# Patient Record
Sex: Female | Born: 1940
Health system: Southern US, Community
[De-identification: ages and names within clinical notes are randomized; demographics above are authoritative.]

## PROBLEM LIST (undated history)

## (undated) DIAGNOSIS — Z8719 Personal history of other diseases of the digestive system: Secondary | ICD-10-CM

## (undated) DIAGNOSIS — R011 Cardiac murmur, unspecified: Secondary | ICD-10-CM

## (undated) DIAGNOSIS — M4802 Spinal stenosis, cervical region: Secondary | ICD-10-CM

## (undated) DIAGNOSIS — B029 Zoster without complications: Secondary | ICD-10-CM

## (undated) DIAGNOSIS — S065X9A Traumatic subdural hemorrhage with loss of consciousness of unspecified duration, initial encounter: Secondary | ICD-10-CM

## (undated) DIAGNOSIS — Z872 Personal history of diseases of the skin and subcutaneous tissue: Secondary | ICD-10-CM

## (undated) DIAGNOSIS — N189 Chronic kidney disease, unspecified: Secondary | ICD-10-CM

## (undated) DIAGNOSIS — K219 Gastro-esophageal reflux disease without esophagitis: Secondary | ICD-10-CM

## (undated) DIAGNOSIS — S065XAA Traumatic subdural hemorrhage with loss of consciousness status unknown, initial encounter: Secondary | ICD-10-CM

## (undated) DIAGNOSIS — E079 Disorder of thyroid, unspecified: Secondary | ICD-10-CM

## (undated) DIAGNOSIS — R197 Diarrhea, unspecified: Secondary | ICD-10-CM

## (undated) DIAGNOSIS — I10 Essential (primary) hypertension: Secondary | ICD-10-CM

## (undated) DIAGNOSIS — K5901 Slow transit constipation: Secondary | ICD-10-CM

## (undated) DIAGNOSIS — H269 Unspecified cataract: Secondary | ICD-10-CM

## (undated) DIAGNOSIS — E039 Hypothyroidism, unspecified: Secondary | ICD-10-CM

## (undated) DIAGNOSIS — C4370 Malignant melanoma of unspecified lower limb, including hip: Secondary | ICD-10-CM

## (undated) DIAGNOSIS — K635 Polyp of colon: Secondary | ICD-10-CM

## (undated) DIAGNOSIS — E785 Hyperlipidemia, unspecified: Secondary | ICD-10-CM

## (undated) HISTORY — DX: Chronic kidney disease, unspecified: N18.9

## (undated) HISTORY — DX: Spinal stenosis, cervical region: M48.02

## (undated) HISTORY — PX: ANTERIOR AND POSTERIOR VAGINAL REPAIR: SUR5

## (undated) HISTORY — DX: Zoster without complications: B02.9

## (undated) HISTORY — PX: LUNG BIOPSY: SHX232

## (undated) HISTORY — PX: ANTERIOR AND POSTERIOR VAGINAL REPAIR W/ SACROSPINOUS LIGAMENT SUSPENSION: SUR6

## (undated) HISTORY — PX: COLONOSCOPY: SHX174

## (undated) HISTORY — DX: Malignant melanoma of unspecified lower limb, including hip: C43.70

## (undated) HISTORY — PX: ESOPHAGOGASTRODUODENOSCOPY: SHX1529

---

## 1961-03-16 HISTORY — PX: BREAST EXCISIONAL BIOPSY: SUR124

## 1961-03-16 HISTORY — PX: BREAST BIOPSY: SHX20

## 1968-03-16 HISTORY — PX: OOPHORECTOMY: SHX86

## 1972-03-16 HISTORY — PX: ABDOMINAL HYSTERECTOMY: SHX81

## 2004-05-29 ENCOUNTER — Ambulatory Visit: Payer: Self-pay | Admitting: Unknown Physician Specialty

## 2005-03-16 HISTORY — PX: LUNG BIOPSY: SHX232

## 2005-07-03 ENCOUNTER — Ambulatory Visit: Payer: Self-pay | Admitting: Unknown Physician Specialty

## 2005-09-24 ENCOUNTER — Ambulatory Visit: Payer: Self-pay | Admitting: Unknown Physician Specialty

## 2005-10-02 ENCOUNTER — Ambulatory Visit: Payer: Self-pay | Admitting: Unknown Physician Specialty

## 2005-11-18 ENCOUNTER — Ambulatory Visit: Payer: Self-pay | Admitting: Cardiothoracic Surgery

## 2005-11-19 ENCOUNTER — Ambulatory Visit: Payer: Self-pay | Admitting: Gastroenterology

## 2005-11-23 ENCOUNTER — Ambulatory Visit: Payer: Self-pay | Admitting: General Surgery

## 2005-11-25 ENCOUNTER — Other Ambulatory Visit: Payer: Self-pay

## 2005-12-02 ENCOUNTER — Inpatient Hospital Stay: Payer: Self-pay | Admitting: General Surgery

## 2006-03-10 ENCOUNTER — Ambulatory Visit: Payer: Self-pay | Admitting: Cardiothoracic Surgery

## 2006-07-29 ENCOUNTER — Ambulatory Visit: Payer: Self-pay | Admitting: Unknown Physician Specialty

## 2006-12-14 ENCOUNTER — Ambulatory Visit: Payer: Self-pay | Admitting: Unknown Physician Specialty

## 2007-08-09 ENCOUNTER — Ambulatory Visit: Payer: Self-pay | Admitting: Unknown Physician Specialty

## 2007-10-10 ENCOUNTER — Ambulatory Visit: Payer: Self-pay | Admitting: Unknown Physician Specialty

## 2008-07-12 ENCOUNTER — Ambulatory Visit: Payer: Self-pay

## 2008-08-15 ENCOUNTER — Ambulatory Visit: Payer: Self-pay | Admitting: Unknown Physician Specialty

## 2009-02-15 ENCOUNTER — Emergency Department: Payer: Self-pay | Admitting: Emergency Medicine

## 2009-08-20 ENCOUNTER — Ambulatory Visit: Payer: Self-pay | Admitting: Unknown Physician Specialty

## 2010-08-27 ENCOUNTER — Ambulatory Visit: Payer: Self-pay | Admitting: Unknown Physician Specialty

## 2010-08-29 ENCOUNTER — Ambulatory Visit: Payer: Self-pay | Admitting: Unknown Physician Specialty

## 2010-11-14 ENCOUNTER — Ambulatory Visit: Payer: Self-pay | Admitting: Unknown Physician Specialty

## 2010-11-25 ENCOUNTER — Ambulatory Visit: Payer: Self-pay

## 2011-02-09 ENCOUNTER — Ambulatory Visit (INDEPENDENT_AMBULATORY_CARE_PROVIDER_SITE_OTHER): Payer: Medicare Other | Admitting: Internal Medicine

## 2011-02-09 ENCOUNTER — Encounter: Payer: Self-pay | Admitting: Internal Medicine

## 2011-02-09 DIAGNOSIS — N189 Chronic kidney disease, unspecified: Secondary | ICD-10-CM

## 2011-02-09 DIAGNOSIS — E039 Hypothyroidism, unspecified: Secondary | ICD-10-CM

## 2011-02-09 DIAGNOSIS — I10 Essential (primary) hypertension: Secondary | ICD-10-CM

## 2011-02-09 DIAGNOSIS — N184 Chronic kidney disease, stage 4 (severe): Secondary | ICD-10-CM | POA: Insufficient documentation

## 2011-02-09 NOTE — Assessment & Plan Note (Signed)
Will get records from nephrologist.

## 2011-02-09 NOTE — Assessment & Plan Note (Signed)
Will get records on previous evaluation, last TSH.  Continue synthroid.

## 2011-02-09 NOTE — Assessment & Plan Note (Signed)
BP well controlled today. Will get records from previous physician and nephrologist. Will continue current meds. Follow up in 6 months (note pt has q7month follow up with nephrologist).

## 2011-02-09 NOTE — Progress Notes (Signed)
Subjective:    Patient ID: Caitlin Lin, female    DOB: [birth year], 70 y.o.   MRN: [id number]  HPI  70 year old female with a history of hypertension and chronic kidney disease presents to establish care. She reports that she has been feeling well. She reports that she is compliant with her medications. She notes that she was recently seen by her nephrologist and kidney function has been stable. She denies any chest pain, palpitations, lower extremity edema. She notes that she has adopted a healthier diet which is lower in sodium. She reports that she has lost 8 pounds with her change in diet.  Outpatient Encounter Prescriptions as of 02/09/2011  Medication Sig Dispense Refill  . amLODipine (NORVASC) 2.5 MG tablet Take 2.5 mg by mouth daily.        Marland Kitchen aspirin EC 81 MG tablet Take 81 mg by mouth daily.        Marland Kitchen atorvastatin (LIPITOR) 10 MG tablet Take 10 mg by mouth daily.        . Cholecalciferol (VITAMIN D) 1000 UNITS capsule Take 1,000 Units by mouth daily.        . Coenzyme Q10 (CO Q 10 PO) Take 2 tablets by mouth daily.        Marland Kitchen CRANBERRY PO Take 3,600 mg by mouth daily.        Marland Kitchen docusate sodium (COLACE) 100 MG capsule Take 200 mg by mouth at bedtime as needed and may repeat dose one time if needed.        . DULoxetine (CYMBALTA) 60 MG capsule Take 60 mg by mouth daily.        . fexofenadine-pseudoephedrine (ALLEGRA-D 24) 180-240 MG per 24 hr tablet Take 1 tablet by mouth daily as needed.        . hydrochlorothiazide (MICROZIDE) 12.5 MG capsule Take 12.5 mg by mouth daily.        Marland Kitchen HYDROcodone-acetaminophen (NORCO) 5-325 MG per tablet Take 1 tablet by mouth every 8 (eight) hours as needed.        Marland Kitchen levothyroxine (SYNTHROID, LEVOTHROID) 75 MCG tablet Take 75 mcg by mouth daily.        . Multiple Vitamins-Minerals (CENTRUM SILVER ULTRA WOMENS) TABS Take 1 tablet by mouth daily.        Marland Kitchen omeprazole (PRILOSEC) 20 MG capsule Take 20 mg by mouth daily.        Marland Kitchen OVER THE COUNTER MEDICATION  Magnesium w/Zinc       . OVER THE COUNTER MEDICATION [employer]       . quinapril (ACCUPRIL) 40 MG tablet Take 40 mg by mouth daily.          Review of Systems  Constitutional: Negative for fever, chills, appetite change, fatigue and unexpected weight change.  HENT: Negative for ear pain, congestion, sore throat, trouble swallowing, neck pain, voice change and sinus pressure.   Eyes: Negative for visual disturbance.  Respiratory: Negative for cough, shortness of breath, wheezing and stridor.   Cardiovascular: Negative for chest pain, palpitations and leg swelling.  Gastrointestinal: Positive for constipation. Negative for nausea, vomiting, abdominal pain, diarrhea, blood in stool, abdominal distention and anal bleeding.  Genitourinary: Negative for dysuria and flank pain.  Musculoskeletal: Negative for myalgias, arthralgias and gait problem.  Skin: Negative for color change and rash.  Neurological: Negative for dizziness and headaches.  Hematological: Negative for adenopathy. Does not bruise/bleed easily.  Psychiatric/Behavioral: Negative for suicidal ideas, sleep disturbance and dysphoric mood. The patient  is not nervous/anxious.    BP 110/82  Pulse 81  Temp(Src) 99 F (37.2 C) (Oral)  Wt 165 lb (74.844 kg)  SpO2 99%     Objective:   Physical Exam  Constitutional: She is oriented to person, place, and time. She appears well-developed and well-nourished. No distress.  HENT:  Head: Normocephalic and atraumatic.  Right Ear: External ear normal.  Left Ear: External ear normal.  Nose: Nose normal.  Mouth/Throat: Oropharynx is clear and moist. No oropharyngeal exudate.  Eyes: Conjunctivae are normal. Pupils are equal, round, and reactive to light. Right eye exhibits no discharge. Left eye exhibits no discharge. No scleral icterus.  Neck: Normal range of motion. Neck supple. No tracheal deviation present. No thyromegaly present.  Cardiovascular: Normal rate, regular  rhythm, normal heart sounds and intact distal pulses.  Exam reveals no gallop and no friction rub.   No murmur heard. Pulmonary/Chest: Effort normal and breath sounds normal. No respiratory distress. She has no wheezes. She has no rales. She exhibits no tenderness.  Musculoskeletal: Normal range of motion. She exhibits no edema and no tenderness.  Lymphadenopathy:    She has no cervical adenopathy.  Neurological: She is alert and oriented to person, place, and time. No cranial nerve deficit. She exhibits normal muscle tone. Coordination normal.  Skin: Skin is warm and dry. No rash noted. She is not diaphoretic. No erythema. No pallor.  Psychiatric: She has a normal mood and affect. Her behavior is normal. Judgment and thought content normal.          Assessment & Plan:

## 2011-03-17 DIAGNOSIS — C4370 Malignant melanoma of unspecified lower limb, including hip: Secondary | ICD-10-CM

## 2011-03-17 DIAGNOSIS — B029 Zoster without complications: Secondary | ICD-10-CM

## 2011-03-17 HISTORY — DX: Malignant melanoma of unspecified lower limb, including hip: C43.70

## 2011-03-17 HISTORY — DX: Zoster without complications: B02.9

## 2011-03-24 ENCOUNTER — Telehealth: Payer: Self-pay | Admitting: Internal Medicine

## 2011-03-24 DIAGNOSIS — R197 Diarrhea, unspecified: Secondary | ICD-10-CM

## 2011-03-24 NOTE — Telephone Encounter (Signed)
I spoke w/pt - She c/o 3 to 6 loose watery stools daily x 1 and 1/2 wks. No fever, visible blood or mucus in stool. She tried pepto x 2 days w/some relief but symptoms returned when she stopped med. She is scheduled for OV Friday for eval. I advised bland diet, avoid dairy and acidic/fried foods, drink clear liquids and call w/any change in symptoms. Pt understood and agreed. Can you advise any OTC meds? Or do you want to do any labs prior to OV?

## 2011-03-24 NOTE — Telephone Encounter (Signed)
[id number]   Cell  [phone] Pt called wanted to see dr walker asap.  Pt stated she has had diarrhea for 1 1/2 week.  No fever stomach cramping

## 2011-03-24 NOTE — Telephone Encounter (Signed)
We should give her a kit to collect a stool sample for culture and CDiff.  I typically avoid meds like immodium until I know that there is no bacterial infection.

## 2011-03-24 NOTE — Telephone Encounter (Signed)
Patient informed, she will come by tomorrow to pick up stool collection kit

## 2011-03-26 ENCOUNTER — Other Ambulatory Visit: Payer: Medicare Other | Admitting: *Deleted

## 2011-03-27 ENCOUNTER — Encounter: Payer: Self-pay | Admitting: Internal Medicine

## 2011-03-27 ENCOUNTER — Ambulatory Visit: Payer: Medicare Other | Admitting: Internal Medicine

## 2011-03-27 ENCOUNTER — Ambulatory Visit (INDEPENDENT_AMBULATORY_CARE_PROVIDER_SITE_OTHER): Payer: Medicare Other | Admitting: Internal Medicine

## 2011-03-27 VITALS — BP 116/60 | HR 81 | Temp 98.5°F | Ht 64.0 in | Wt 158.0 lb

## 2011-03-27 DIAGNOSIS — R197 Diarrhea, unspecified: Secondary | ICD-10-CM

## 2011-03-27 DIAGNOSIS — N189 Chronic kidney disease, unspecified: Secondary | ICD-10-CM

## 2011-03-27 LAB — CBC WITH DIFFERENTIAL/PLATELET
Basophils Relative: 0.6 % (ref 0.0–3.0)
Eosinophils Relative: 1.7 % (ref 0.0–5.0)
Lymphocytes Relative: 40.8 % (ref 12.0–46.0)
Monocytes Relative: 16.5 % — ABNORMAL HIGH (ref 3.0–12.0)
Neutrophils Relative %: 40.4 % — ABNORMAL LOW (ref 43.0–77.0)
Platelets: 233 10*3/uL (ref 150.0–400.0)
RBC: 4.17 Mil/uL (ref 3.87–5.11)
WBC: 3.9 10*3/uL — ABNORMAL LOW (ref 4.5–10.5)

## 2011-03-27 LAB — COMPREHENSIVE METABOLIC PANEL
Albumin: 3.9 g/dL (ref 3.5–5.2)
Alkaline Phosphatase: 89 U/L (ref 39–117)
BUN: 27 mg/dL — ABNORMAL HIGH (ref 6–23)
CO2: 27 mEq/L (ref 19–32)
GFR: 26.96 mL/min — ABNORMAL LOW (ref >60.00)
Glucose, Bld: 100 mg/dL — ABNORMAL HIGH (ref 70–99)
Potassium: 4.3 mEq/L (ref 3.5–5.1)
Sodium: 142 mEq/L (ref 135–145)
Total Protein: 7.1 g/dL (ref 6.0–8.3)

## 2011-03-27 LAB — CLOSTRIDIUM DIFFICILE EIA: CDIFTX: NEGATIVE

## 2011-03-27 MED ORDER — CIPROFLOXACIN HCL 250 MG PO TABS: 250.0000 mg | ORAL_TABLET | Freq: Two times a day (BID) | ORAL | Status: AC

## 2011-03-27 MED ORDER — METRONIDAZOLE 500 MG PO TABS: 500.0000 mg | ORAL_TABLET | Freq: Two times a day (BID) | ORAL | Status: AC

## 2011-03-27 NOTE — Progress Notes (Signed)
Subjective:    Patient ID: Caitlin Lin, female    DOB: 05-Oct-1940, 71 y.o.   MRN: [id number]  HPI 71YO female with history of CKD III presents with 12 day h/o watery diarrhea.  Started suddenly after holidays. No other friend/family members ill to suggest food exposure.  No fever, chills, abdominal pain, nausea, or vomiting.  Episodes occur several times per day, often urgent, has difficulty making it to restroom. Described as watery-green diarrhea with no blood or mucous. Diarrhea improved slightly with eliminating intake of dairy and following clear liquid diet. Pt took immodium on 2 occasions with some improvement.  Outpatient Encounter Prescriptions as of 03/27/2011  Medication Sig Dispense Refill  . amLODipine (NORVASC) 2.5 MG tablet Take 2.5 mg by mouth daily.        Marland Kitchen aspirin EC 81 MG tablet Take 81 mg by mouth daily.        Marland Kitchen atorvastatin (LIPITOR) 10 MG tablet Take 10 mg by mouth daily.        . Cholecalciferol (VITAMIN D) 1000 UNITS capsule Take 1,000 Units by mouth daily.        . Coenzyme Q10 (CO Q 10 PO) Take 2 tablets by mouth daily.        Marland Kitchen CRANBERRY PO Take 3,600 mg by mouth daily.        Marland Kitchen docusate sodium (COLACE) 100 MG capsule Take 200 mg by mouth at bedtime as needed and may repeat dose one time if needed.        . DULoxetine (CYMBALTA) 60 MG capsule Take 60 mg by mouth daily.        . fexofenadine-pseudoephedrine (ALLEGRA-D 24) 180-240 MG per 24 hr tablet Take 1 tablet by mouth daily as needed.        . hydrochlorothiazide (MICROZIDE) 12.5 MG capsule Take 12.5 mg by mouth daily.        Marland Kitchen HYDROcodone-acetaminophen (NORCO) 5-325 MG per tablet Take 1 tablet by mouth every 8 (eight) hours as needed.        Marland Kitchen levothyroxine (SYNTHROID, LEVOTHROID) 75 MCG tablet Take 75 mcg by mouth daily.        . Multiple Vitamins-Minerals (CENTRUM SILVER ULTRA WOMENS) TABS Take 1 tablet by mouth daily.        Marland Kitchen omeprazole (PRILOSEC) 20 MG capsule Take 20 mg by mouth daily.        Marland Kitchen OVER THE  COUNTER MEDICATION Magnesium w/Zinc       . OVER THE COUNTER MEDICATION [employer]       . quinapril (ACCUPRIL) 40 MG tablet Take 40 mg by mouth daily.        . ciprofloxacin (CIPRO) 250 MG tablet Take 1 tablet (250 mg total) by mouth 2 (two) times daily.  28 tablet  0  . metroNIDAZOLE (FLAGYL) 500 MG tablet Take 1 tablet (500 mg total) by mouth 2 (two) times daily.  28 tablet  0    Review of Systems  Constitutional: Negative for fever, chills, appetite change, fatigue and unexpected weight change.  HENT: Negative for ear pain, congestion, sore throat, trouble swallowing, neck pain and sinus pressure.   Eyes: Negative for visual disturbance.  Respiratory: Negative for cough, shortness of breath, wheezing and stridor.   Cardiovascular: Negative for chest pain, palpitations and leg swelling.  Gastrointestinal: Positive for diarrhea. Negative for nausea, vomiting, abdominal pain, constipation, blood in stool, abdominal distention and anal bleeding.  Genitourinary: Negative for dysuria and flank pain.  Musculoskeletal:  Negative for myalgias, arthralgias and gait problem.  Skin: Negative for color change and rash.  Neurological: Negative for dizziness and headaches.  Hematological: Negative for adenopathy. Does not bruise/bleed easily.  Psychiatric/Behavioral: Negative for suicidal ideas, sleep disturbance and dysphoric mood. The patient is not nervous/anxious.    BP 116/60  Pulse 81  Temp(Src) 98.5 F (36.9 C) (Oral)  Ht 5\' 4"  (1.626 m)  Wt 158 lb (71.668 kg)  BMI 27.12 kg/m2  SpO2 96%     Objective:   Physical Exam  Constitutional: She is oriented to person, place, and time. She appears well-developed and well-nourished. No distress.  HENT:  Head: Normocephalic and atraumatic.  Right Ear: External ear normal.  Left Ear: External ear normal.  Nose: Nose normal.  Mouth/Throat: Oropharynx is clear and moist. No oropharyngeal exudate.  Eyes: Conjunctivae are normal.  Pupils are equal, round, and reactive to light. Right eye exhibits no discharge. Left eye exhibits no discharge. No scleral icterus.  Neck: Normal range of motion. Neck supple. No tracheal deviation present. No thyromegaly present.  Cardiovascular: Normal rate, regular rhythm, normal heart sounds and intact distal pulses.  Exam reveals no gallop and no friction rub.   No murmur heard. Pulmonary/Chest: Effort normal and breath sounds normal. No respiratory distress. She has no wheezes. She has no rales. She exhibits no tenderness.  Abdominal: Soft. Bowel sounds are normal. She exhibits no distension and no mass. There is tenderness (very mild diffuse). There is no rebound and no guarding.  Musculoskeletal: Normal range of motion. She exhibits no edema and no tenderness.  Lymphadenopathy:    She has no cervical adenopathy.  Neurological: She is alert and oriented to person, place, and time. No cranial nerve deficit. She exhibits normal muscle tone. Coordination normal.  Skin: Skin is warm and dry. No rash noted. She is not diaphoretic. No erythema. No pallor.  Psychiatric: She has a normal mood and affect. Her behavior is normal. Judgment and thought content normal.          Assessment & Plan:  1. Diarrhea - Persistent x 12 days. Stool culture is pending. CDiff was negative. Given persistence of symptoms, will start empiric cipro and flagyl.  Will start probiotic. Will continue to limit intake of dairy.  Would like to get CT abdomen, but pt has underlying CKD and Cr at baseline 1.7, so dye load would put her at high risk of worsening renal failure.  Will set up GI evaluation for colonoscopy.  Follow up 1 month.  2. CKD III - Will check Cr today given recent diarrhea and dehydration.

## 2011-03-27 NOTE — Patient Instructions (Signed)
Labs today. Start antibiotics. Start Align daily. Follow up with Dr. Tiffany Kocher at [place].

## 2011-03-30 LAB — HELICOBACTER PYLORI  ANTIBODY, IGM: Helicobacter pylori, IgM: 4.2 U/mL (ref <9.0)

## 2011-03-30 LAB — STOOL CULTURE

## 2011-03-31 ENCOUNTER — Encounter: Payer: Self-pay | Admitting: Gastroenterology

## 2011-04-30 ENCOUNTER — Ambulatory Visit (INDEPENDENT_AMBULATORY_CARE_PROVIDER_SITE_OTHER): Payer: Medicare Other | Admitting: Internal Medicine

## 2011-04-30 ENCOUNTER — Encounter: Payer: Self-pay | Admitting: Internal Medicine

## 2011-04-30 DIAGNOSIS — R197 Diarrhea, unspecified: Secondary | ICD-10-CM | POA: Insufficient documentation

## 2011-04-30 DIAGNOSIS — N189 Chronic kidney disease, unspecified: Secondary | ICD-10-CM

## 2011-04-30 DIAGNOSIS — I10 Essential (primary) hypertension: Secondary | ICD-10-CM

## 2011-04-30 NOTE — Assessment & Plan Note (Signed)
Blood pressure well-controlled despite stopping hydrochlorothiazide. We'll continue to monitor. Followup in May 2013.

## 2011-04-30 NOTE — Assessment & Plan Note (Signed)
Symptoms have improved. However, patient continues to have leakage of soft stool throughout the day, requiring the use of a pad. She has scheduled followup with GI medicine next week. Question if she may need colonoscopy for further evaluation. She will followup here in 3 months.

## 2011-04-30 NOTE — Assessment & Plan Note (Signed)
Recently seen by nephrologist. Hydrochlorothiazide was stopped. Will plan to repeat renal function at her visit in May 2013.

## 2011-04-30 NOTE — Progress Notes (Signed)
Subjective:    Patient ID: Caitlin Lin, female    DOB: Jan 03, 1941, 71 y.o.   MRN: [id number]  HPI 71 year old female with history of hypertension, chronic kidney disease, and recent episode of diarrhea presents for followup. Initially, with her diarrhea she was having multiple episodes per day of loose stool. Stool cultures were negative. Referral was set up to GI medicine. This is pending for 05/18/2011. Patient reports that symptoms have improved. However, she continues to have leakage of soft stool from her rectum throughout the day. She has started wearing a pad because of this. She denies blood in her stool. She denies pain in her rectum. She denies abdominal pain. She does note increased gas. She has been taking omeprazole with no improvement. She denies any nausea or vomiting.  In regards to her hypertension and chronic kidney disease she notes that her nephrologist recently stopped her hydrochlorothiazide because of rising creatinine. She has been monitoring her blood pressure carefully and has not noted any elevated blood pressure. She denies any headache, chest pain, or palpitations.  Outpatient Encounter Prescriptions as of 04/30/2011  Medication Sig Dispense Refill  . amLODipine (NORVASC) 2.5 MG tablet Take 2.5 mg by mouth daily.        Marland Kitchen aspirin EC 81 MG tablet Take 81 mg by mouth daily.        Marland Kitchen atorvastatin (LIPITOR) 10 MG tablet Take 10 mg by mouth daily.        . Cholecalciferol (VITAMIN D) 1000 UNITS capsule Take 1,000 Units by mouth daily.        . Coenzyme Q10 (CO Q 10 PO) Take 2 tablets by mouth daily.        Marland Kitchen CRANBERRY PO Take 3,600 mg by mouth daily.        Marland Kitchen docusate sodium (COLACE) 100 MG capsule Take 200 mg by mouth at bedtime as needed and may repeat dose one time if needed.        . DULoxetine (CYMBALTA) 60 MG capsule Take 60 mg by mouth daily.        . fexofenadine-pseudoephedrine (ALLEGRA-D 24) 180-240 MG per 24 hr tablet Take 1 tablet by mouth daily as needed.         Marland Kitchen HYDROcodone-acetaminophen (NORCO) 5-325 MG per tablet Take 1 tablet by mouth every 8 (eight) hours as needed.        Marland Kitchen levothyroxine (SYNTHROID, LEVOTHROID) 75 MCG tablet Take 75 mcg by mouth daily.        . Multiple Vitamins-Minerals (CENTRUM SILVER ULTRA WOMENS) TABS Take 1 tablet by mouth daily.        Marland Kitchen omeprazole (PRILOSEC) 20 MG capsule Take 20 mg by mouth daily.        Marland Kitchen OVER THE COUNTER MEDICATION Magnesium w/Zinc       . OVER THE COUNTER MEDICATION [employer]       . quinapril (ACCUPRIL) 40 MG tablet Take 40 mg by mouth daily.        . hydrochlorothiazide (MICROZIDE) 12.5 MG capsule Take 12.5 mg by mouth daily.          Review of Systems  Constitutional: Negative for fever, chills, appetite change, fatigue and unexpected weight change.  HENT: Negative for ear pain, congestion, sore throat, trouble swallowing, neck pain, voice change and sinus pressure.   Eyes: Negative for visual disturbance.  Respiratory: Negative for cough, shortness of breath, wheezing and stridor.   Cardiovascular: Negative for chest pain, palpitations and leg swelling.  Gastrointestinal: Positive for diarrhea. Negative for nausea, vomiting, abdominal pain, constipation, blood in stool, abdominal distention, anal bleeding and rectal pain.  Genitourinary: Negative for dysuria and flank pain.  Musculoskeletal: Negative for myalgias, arthralgias and gait problem.  Skin: Negative for color change and rash.  Neurological: Negative for dizziness and headaches.  Hematological: Negative for adenopathy. Does not bruise/bleed easily.  Psychiatric/Behavioral: Negative for suicidal ideas, sleep disturbance and dysphoric mood. The patient is not nervous/anxious.    BP 128/74  Pulse 100  Temp(Src) 98.4 F (36.9 C) (Oral)  Ht 5' 3.5" (1.613 m)  Wt 157 lb (71.215 kg)  BMI 27.38 kg/m2  SpO2 100%     Objective:   Physical Exam  Constitutional: She is oriented to person, place, and time. She appears  well-developed and well-nourished. No distress.  HENT:  Head: Normocephalic and atraumatic.  Right Ear: External ear normal.  Left Ear: External ear normal.  Nose: Nose normal.  Mouth/Throat: Oropharynx is clear and moist. No oropharyngeal exudate.  Eyes: Conjunctivae are normal. Pupils are equal, round, and reactive to light. Right eye exhibits no discharge. Left eye exhibits no discharge. No scleral icterus.  Neck: Normal range of motion. Neck supple. No tracheal deviation present. No thyromegaly present.  Cardiovascular: Normal rate, regular rhythm, normal heart sounds and intact distal pulses.  Exam reveals no gallop and no friction rub.   No murmur heard. Pulmonary/Chest: Effort normal and breath sounds normal. No respiratory distress. She has no wheezes. She has no rales. She exhibits no tenderness.  Abdominal: Soft. Bowel sounds are normal. She exhibits no distension and no mass. There is no tenderness. There is no rebound and no guarding.  Musculoskeletal: Normal range of motion. She exhibits no edema and no tenderness.  Lymphadenopathy:    She has no cervical adenopathy.  Neurological: She is alert and oriented to person, place, and time. No cranial nerve deficit. She exhibits normal muscle tone. Coordination normal.  Skin: Skin is warm and dry. No rash noted. She is not diaphoretic. No erythema. No pallor.  Psychiatric: She has a normal mood and affect. Her behavior is normal. Judgment and thought content normal.          Assessment & Plan:

## 2011-05-11 ENCOUNTER — Ambulatory Visit: Payer: Medicare Other | Admitting: Gastroenterology

## 2011-06-24 ENCOUNTER — Ambulatory Visit: Payer: Self-pay | Admitting: Gastroenterology

## 2011-06-24 LAB — HM COLONOSCOPY: HM Colonoscopy: NORMAL

## 2011-06-26 LAB — PATHOLOGY REPORT

## 2011-07-09 ENCOUNTER — Encounter: Payer: Self-pay | Admitting: Internal Medicine

## 2011-07-13 ENCOUNTER — Other Ambulatory Visit: Payer: Self-pay | Admitting: *Deleted

## 2011-07-13 MED ORDER — QUINAPRIL HCL 40 MG PO TABS: 40.0000 mg | ORAL_TABLET | Freq: Every day | ORAL | Status: DC

## 2011-07-20 ENCOUNTER — Other Ambulatory Visit: Payer: Self-pay | Admitting: *Deleted

## 2011-07-20 MED ORDER — LEVOTHYROXINE SODIUM 75 MCG PO TABS: 75.0000 ug | ORAL_TABLET | Freq: Every day | ORAL | Status: DC

## 2011-07-20 MED ORDER — OMEPRAZOLE 20 MG PO CPDR: 20.0000 mg | DELAYED_RELEASE_CAPSULE | Freq: Every day | ORAL | Status: DC

## 2011-07-20 MED ORDER — AMLODIPINE BESYLATE 2.5 MG PO TABS: 2.5000 mg | ORAL_TABLET | Freq: Every day | ORAL | Status: DC

## 2011-07-20 NOTE — Telephone Encounter (Signed)
Refill request for Amlodipine, Levothyroxine, and Omeprazole to [facility]. Done/SLS

## 2011-08-03 ENCOUNTER — Ambulatory Visit (INDEPENDENT_AMBULATORY_CARE_PROVIDER_SITE_OTHER): Payer: Medicare Other | Admitting: Internal Medicine

## 2011-08-03 ENCOUNTER — Encounter: Payer: Self-pay | Admitting: Internal Medicine

## 2011-08-03 DIAGNOSIS — R252 Cramp and spasm: Secondary | ICD-10-CM | POA: Insufficient documentation

## 2011-08-03 DIAGNOSIS — Z0000 Encounter for general adult medical examination without abnormal findings: Secondary | ICD-10-CM | POA: Insufficient documentation

## 2011-08-03 DIAGNOSIS — I10 Essential (primary) hypertension: Secondary | ICD-10-CM

## 2011-08-03 DIAGNOSIS — E039 Hypothyroidism, unspecified: Secondary | ICD-10-CM

## 2011-08-03 DIAGNOSIS — K219 Gastro-esophageal reflux disease without esophagitis: Secondary | ICD-10-CM

## 2011-08-03 DIAGNOSIS — K623 Rectal prolapse: Secondary | ICD-10-CM | POA: Insufficient documentation

## 2011-08-03 DIAGNOSIS — E785 Hyperlipidemia, unspecified: Secondary | ICD-10-CM

## 2011-08-03 DIAGNOSIS — N898 Other specified noninflammatory disorders of vagina: Secondary | ICD-10-CM

## 2011-08-03 DIAGNOSIS — Z1239 Encounter for other screening for malignant neoplasm of breast: Secondary | ICD-10-CM

## 2011-08-03 LAB — COMPREHENSIVE METABOLIC PANEL
ALT: 20 U/L (ref 0–35)
AST: 26 U/L (ref 0–37)
Alkaline Phosphatase: 86 U/L (ref 39–117)
BUN: 26 mg/dL — ABNORMAL HIGH (ref 6–23)
Creatinine, Ser: 1.6 mg/dL — ABNORMAL HIGH (ref 0.4–1.2)
Total Bilirubin: 0.6 mg/dL (ref 0.3–1.2)

## 2011-08-03 LAB — MAGNESIUM: Magnesium: 2.1 mg/dL (ref 1.5–2.5)

## 2011-08-03 MED ORDER — OMEPRAZOLE 20 MG PO CPDR: 20.0000 mg | DELAYED_RELEASE_CAPSULE | Freq: Every day | ORAL | Status: DC

## 2011-08-03 MED ORDER — QUINAPRIL HCL 40 MG PO TABS: 40.0000 mg | ORAL_TABLET | Freq: Every day | ORAL | Status: DC

## 2011-08-03 MED ORDER — AMLODIPINE BESYLATE 2.5 MG PO TABS: 2.5000 mg | ORAL_TABLET | Freq: Every day | ORAL | Status: DC

## 2011-08-03 MED ORDER — LEVOTHYROXINE SODIUM 75 MCG PO TABS: 75.0000 ug | ORAL_TABLET | Freq: Every day | ORAL | Status: DC

## 2011-08-03 MED ORDER — ATORVASTATIN CALCIUM 10 MG PO TABS: 10.0000 mg | ORAL_TABLET | Freq: Every day | ORAL | Status: DC

## 2011-08-03 NOTE — Assessment & Plan Note (Signed)
Will set up referral to colorectal surgeon.

## 2011-08-03 NOTE — Assessment & Plan Note (Signed)
Suspect these may be related to electrolyte imbalance given patient's increased physical activity recently. Will check electrolytes and renal function with labs today. We'll also check TSH.

## 2011-08-03 NOTE — Progress Notes (Signed)
Subjective:    Patient ID: Caitlin Lin, female    DOB: 10-Jan-1941, 71 y.o.   MRN: [id number]  HPI The patient is here for annual Medicare wellness examination and management of other chronic and acute problems.   The risk factors are reflected in the social history.  The roster of all physicians providing medical care to patient - is listed in the Snapshot section of the chart.  Activities of daily living:  The patient is 100% independent in all ADLs: dressing, toileting, feeding as well as independent mobility  Home safety : The patient has smoke detectors in the home. They wear seatbelts.  There are no firearms at home. There is no violence in the home.   There is no risks for hepatitis, STDs or HIV. There is no history of blood transfusion. They have no travel history to infectious disease endemic areas of the world.  The patient has seen their dentist in the last six month [facility], Dr. Terance Hart). They have seen their eye doctor in the last year (Dr. Chong Sicilian). They admit to slight hearing difficulty with regard to whispered voices.  They have deferred audiologic testing in the last year.   They do not have excessive sun exposure. Discussed the need for sun protection: hats, long sleeves and use of sunscreen if there is significant sun exposure.   Diet: the importance of a healthy diet is discussed. They do have a healthy diet. Increased fruits, vegs.  The benefits of regular aerobic exercise were discussed. [employer] 3 x per week, 1 hr treadmill/machines and 1 hr class.  Depression screen: there are no signs or vegative symptoms of depression- irritability, change in appetite, anhedonia, sadness/tearfullness.  Cognitive assessment: the patient manages all their financial and personal affairs and is actively engaged. They could relate day,date,year and events.  The following portions of the patient's history were reviewed and updated as appropriate: allergies, current medications,  past family history, past medical history,  past surgical history, past social history  and problem list.  Advances directives - HCPOA daughter, Foye Spurling.   Visual acuity was not assessed per patient preference since she has regular follow up with her ophthalmologist. Hearing and body mass index were assessed and reviewed.   During the course of the visit the patient was educated and counseled about appropriate screening and preventive services including : fall prevention , diabetes screening, nutrition counseling, colorectal cancer screening, and recommended immunizations.    Patient is also concerned today about some increased white and yellow vaginal discharge. She reports that this has occurred in the past and she was treated with a cream. She has tried using over-the-counter anti-yeast medications with no improvement. She denies any pain in the vaginal area. She does occasionally have itching. She is also concerned about progressive rectal prolapse. She reports that even after a bowel movement she will have continued passage of stool but makes it difficult for her to function.  Outpatient Encounter Prescriptions as of 08/03/2011  Medication Sig Dispense Refill  . amLODipine (NORVASC) 2.5 MG tablet Take 1 tablet (2.5 mg total) by mouth daily.  90 tablet  4  . aspirin EC 81 MG tablet Take 81 mg by mouth daily.        Marland Kitchen atorvastatin (LIPITOR) 10 MG tablet Take 1 tablet (10 mg total) by mouth daily.  90 tablet  4  . Cholecalciferol (VITAMIN D) 1000 UNITS capsule Take 1,000 Units by mouth daily.        . Coenzyme Q10 (  CO Q 10 PO) Take 2 tablets by mouth daily. W/Fish Oil Omega 3 1290-900 mg.      . CRANBERRY PO Take 3,600 mg by mouth daily.        . DULoxetine (CYMBALTA) 60 MG capsule Take 60 mg by mouth daily.        Marland Kitchen HYDROcodone-acetaminophen (NORCO) 5-325 MG per tablet Take 1 tablet by mouth every 8 (eight) hours as needed.        Marland Kitchen levothyroxine (SYNTHROID, LEVOTHROID) 75 MCG tablet Take  1 tablet (75 mcg total) by mouth daily.  90 tablet  4  . Multiple Vitamins-Minerals (CENTRUM SILVER ULTRA WOMENS) TABS Take 1 tablet by mouth daily.        Marland Kitchen omeprazole (PRILOSEC) 20 MG capsule Take 1 capsule (20 mg total) by mouth daily.  90 capsule  4  . quinapril (ACCUPRIL) 40 MG tablet Take 1 tablet (40 mg total) by mouth daily.  90 tablet  4  . docusate sodium (COLACE) 100 MG capsule Take 200 mg by mouth at bedtime as needed and may repeat dose one time if needed.        . fexofenadine-pseudoephedrine (ALLEGRA-D 24) 180-240 MG per 24 hr tablet Take 1 tablet by mouth daily as needed.          BP 104/67  Pulse 78  Temp(Src) 98.2 F (36.8 C) (Oral)  Resp 14  Ht 5' 3.75" (1.619 m)  Wt 153 lb 12 oz (69.741 kg)  BMI 26.60 kg/m2  SpO2 99%  Review of Systems  Constitutional: Negative for fever, chills, appetite change, fatigue and unexpected weight change.  HENT: Negative for ear pain, congestion, sore throat, trouble swallowing, neck pain, voice change and sinus pressure.   Eyes: Negative for visual disturbance.  Respiratory: Negative for cough, shortness of breath, wheezing and stridor.   Cardiovascular: Negative for chest pain, palpitations and leg swelling.  Gastrointestinal: Positive for rectal pain. Negative for nausea, vomiting, abdominal pain, diarrhea, constipation, blood in stool, abdominal distention and anal bleeding.  Genitourinary: Positive for vaginal discharge. Negative for dysuria, urgency, hematuria, flank pain, vaginal bleeding, genital sores, vaginal pain and pelvic pain.  Musculoskeletal: Negative for myalgias, arthralgias and gait problem.  Skin: Negative for color change and rash.  Neurological: Negative for dizziness and headaches.  Hematological: Negative for adenopathy. Does not bruise/bleed easily.  Psychiatric/Behavioral: Negative for suicidal ideas, sleep disturbance and dysphoric mood. The patient is not nervous/anxious.        Objective:   Physical Exam    Constitutional: She is oriented to person, place, and time. She appears well-developed and well-nourished. No distress.  HENT:  Head: Normocephalic and atraumatic.  Right Ear: External ear normal.  Left Ear: External ear normal.  Nose: Nose normal.  Mouth/Throat: Oropharynx is clear and moist. No oropharyngeal exudate.  Eyes: Conjunctivae are normal. Pupils are equal, round, and reactive to light. Right eye exhibits no discharge. Left eye exhibits no discharge. No scleral icterus.  Neck: Normal range of motion. Neck supple. No tracheal deviation present. No thyromegaly present.  Cardiovascular: Normal rate, regular rhythm, normal heart sounds and intact distal pulses.  Exam reveals no gallop and no friction rub.   No murmur heard. Pulmonary/Chest: Effort normal and breath sounds normal. No respiratory distress. She has no wheezes. She has no rales. She exhibits no tenderness.  Abdominal: Soft. Bowel sounds are normal. She exhibits no distension and no mass. There is no tenderness. There is no rebound and no guarding.  Genitourinary: No  erythema, tenderness or bleeding around the vagina. Vaginal discharge (white, watery) found.       Prolapse of rectum noted  Musculoskeletal: Normal range of motion. She exhibits no edema and no tenderness.  Lymphadenopathy:    She has no cervical adenopathy.  Neurological: She is alert and oriented to person, place, and time. No cranial nerve deficit. She exhibits normal muscle tone. Coordination normal.  Skin: Skin is warm and dry. No rash noted. She is not diaphoretic. No erythema. No pallor.  Psychiatric: She has a normal mood and affect. Her behavior is normal. Judgment and thought content normal.          Assessment & Plan:

## 2011-08-03 NOTE — Assessment & Plan Note (Signed)
Exam is not consistent with yeast or bacterial vaginosis. Will send culture today.

## 2011-08-03 NOTE — Assessment & Plan Note (Signed)
General medical exam including breast exam is normal today. Health maintenance is up to date. We'll check labs today including CBC, CMP, TSH. Patient will followup in 4 months.

## 2011-08-04 ENCOUNTER — Telehealth: Payer: Self-pay | Admitting: *Deleted

## 2011-08-04 DIAGNOSIS — E038 Other specified hypothyroidism: Secondary | ICD-10-CM

## 2011-08-04 DIAGNOSIS — E875 Hyperkalemia: Secondary | ICD-10-CM

## 2011-08-04 NOTE — Telephone Encounter (Signed)
New lab orders entered; patient informed, scheduled for 05.22.13 1:30pm/SLS

## 2011-08-04 NOTE — Telephone Encounter (Signed)
Message copied by Rockwell Germany on Tue Aug 04, 2011  4:19 PM ------      Message from: Ronette Deter A      Created: Tue Aug 04, 2011  1:23 PM       Can you please ask pt to come in tomorrow to repeat BMP, as K was elevated? Suspect from hemolysis.  And, can you please add a Free T4 to labs. Thanks

## 2011-08-05 ENCOUNTER — Other Ambulatory Visit (INDEPENDENT_AMBULATORY_CARE_PROVIDER_SITE_OTHER): Payer: Medicare Other | Admitting: *Deleted

## 2011-08-05 DIAGNOSIS — E875 Hyperkalemia: Secondary | ICD-10-CM

## 2011-08-05 DIAGNOSIS — I10 Essential (primary) hypertension: Secondary | ICD-10-CM

## 2011-08-05 LAB — BASIC METABOLIC PANEL
GFR: 31.55 mL/min — ABNORMAL LOW (ref >60.00)
Potassium: 5.3 mEq/L — ABNORMAL HIGH (ref 3.5–5.1)
Sodium: 137 mEq/L (ref 135–145)

## 2011-08-05 LAB — T4, FREE: Free T4: 1.18 ng/dL (ref 0.60–1.60)

## 2011-08-06 LAB — GONOCOCCUS CULTURE

## 2011-08-10 ENCOUNTER — Ambulatory Visit: Payer: Medicare Other | Admitting: Internal Medicine

## 2011-08-11 ENCOUNTER — Other Ambulatory Visit (INDEPENDENT_AMBULATORY_CARE_PROVIDER_SITE_OTHER): Payer: Medicare Other | Admitting: *Deleted

## 2011-08-11 DIAGNOSIS — Z79899 Other long term (current) drug therapy: Secondary | ICD-10-CM

## 2011-08-11 LAB — BASIC METABOLIC PANEL
Chloride: 106 mEq/L (ref 96–112)
Potassium: 4.4 mEq/L (ref 3.5–5.1)

## 2011-09-03 ENCOUNTER — Ambulatory Visit: Payer: Self-pay | Admitting: Internal Medicine

## 2011-09-09 ENCOUNTER — Encounter: Payer: Self-pay | Admitting: Internal Medicine

## 2011-12-04 ENCOUNTER — Encounter: Payer: Self-pay | Admitting: Internal Medicine

## 2011-12-04 ENCOUNTER — Ambulatory Visit (INDEPENDENT_AMBULATORY_CARE_PROVIDER_SITE_OTHER): Payer: Medicare Other | Admitting: Internal Medicine

## 2011-12-04 VITALS — BP 122/80 | HR 60 | Temp 98.5°F | Ht 63.75 in | Wt 157.8 lb

## 2011-12-04 DIAGNOSIS — E785 Hyperlipidemia, unspecified: Secondary | ICD-10-CM

## 2011-12-04 DIAGNOSIS — K623 Rectal prolapse: Secondary | ICD-10-CM

## 2011-12-04 DIAGNOSIS — K219 Gastro-esophageal reflux disease without esophagitis: Secondary | ICD-10-CM

## 2011-12-04 DIAGNOSIS — I10 Essential (primary) hypertension: Secondary | ICD-10-CM

## 2011-12-04 DIAGNOSIS — M549 Dorsalgia, unspecified: Secondary | ICD-10-CM

## 2011-12-04 DIAGNOSIS — E039 Hypothyroidism, unspecified: Secondary | ICD-10-CM

## 2011-12-04 MED ORDER — OMEPRAZOLE 20 MG PO CPDR: 20.0000 mg | DELAYED_RELEASE_CAPSULE | Freq: Every day | ORAL | Status: DC

## 2011-12-04 MED ORDER — ATORVASTATIN CALCIUM 10 MG PO TABS: 10.0000 mg | ORAL_TABLET | Freq: Every day | ORAL | Status: DC

## 2011-12-04 MED ORDER — AMLODIPINE BESYLATE 2.5 MG PO TABS: 2.5000 mg | ORAL_TABLET | Freq: Every day | ORAL | Status: DC

## 2011-12-04 MED ORDER — QUINAPRIL HCL 20 MG PO TABS: 20.0000 mg | ORAL_TABLET | Freq: Every day | ORAL | Status: DC

## 2011-12-04 MED ORDER — DULOXETINE HCL 60 MG PO CPEP: 60.0000 mg | ORAL_CAPSULE | Freq: Every day | ORAL | Status: DC

## 2011-12-04 MED ORDER — LEVOTHYROXINE SODIUM 75 MCG PO TABS: 75.0000 ug | ORAL_TABLET | Freq: Every day | ORAL | Status: DC

## 2011-12-04 NOTE — Progress Notes (Signed)
Subjective:    Patient ID: Caitlin Lin, female    DOB: [birth year], 71 y.o.   MRN: [id number]  HPI 71 year old female with history of hypertension, hypothyroidism, hyperlipidemia presents for followup. At her recent visit she was found to have rectal prolapse. She reports she was seen by colorectal surgeon and referred to urogynecology. She is scheduled for repair of rectal prolapse on October 4 at [facility]. Next  In regards to hypertension, she reports full compliance with medications. She denies any chest pain, palpitations, headache.  In regards to chronic back pain, she reports significant improvement in symptoms with use of Cymbalta. She denies any noted side effects from this medication.  Outpatient Encounter Prescriptions as of 12/04/2011  Medication Sig Dispense Refill  . amLODipine (NORVASC) 2.5 MG tablet Take 1 tablet (2.5 mg total) by mouth daily.  90 tablet  4  . aspirin EC 81 MG tablet Take 81 mg by mouth daily.        Marland Kitchen atorvastatin (LIPITOR) 10 MG tablet Take 1 tablet (10 mg total) by mouth daily.  90 tablet  4  . Cholecalciferol (VITAMIN D) 1000 UNITS capsule Take 1,000 Units by mouth daily.        Marland Kitchen CRANBERRY PO Take 3,600 mg by mouth daily.        . DULoxetine (CYMBALTA) 60 MG capsule Take 1 capsule (60 mg total) by mouth daily.  90 capsule  4  . fexofenadine-pseudoephedrine (ALLEGRA-D 24) 180-240 MG per 24 hr tablet Take 1 tablet by mouth daily as needed.        Marland Kitchen levothyroxine (SYNTHROID, LEVOTHROID) 75 MCG tablet Take 1 tablet (75 mcg total) by mouth daily.  90 tablet  4  . Multiple Vitamins-Minerals (CENTRUM SILVER ULTRA WOMENS) TABS Take 1 tablet by mouth daily.        Marland Kitchen omeprazole (PRILOSEC) 20 MG capsule Take 1 capsule (20 mg total) by mouth daily.  90 capsule  4  . quinapril (ACCUPRIL) 20 MG tablet Take 1 tablet (20 mg total) by mouth daily.  90 tablet  4   BP 122/80  Pulse 60  Temp 98.5 F (36.9 C) (Oral)  Ht 5' 3.75" (1.619 m)  Wt 157 lb 12 oz (71.555  kg)  BMI 27.29 kg/m2  SpO2 96%  Review of Systems  Constitutional: Negative for fever, chills, appetite change, fatigue and unexpected weight change.  HENT: Negative for ear pain, congestion, sore throat, trouble swallowing, neck pain, voice change and sinus pressure.   Eyes: Negative for visual disturbance.  Respiratory: Negative for cough, shortness of breath, wheezing and stridor.   Cardiovascular: Negative for chest pain, palpitations and leg swelling.  Gastrointestinal: Negative for nausea, vomiting, abdominal pain, diarrhea, constipation, blood in stool, abdominal distention and anal bleeding.  Genitourinary: Negative for dysuria and flank pain.  Musculoskeletal: Negative for myalgias, arthralgias and gait problem.  Skin: Negative for color change and rash.  Neurological: Negative for dizziness and headaches.  Hematological: Negative for adenopathy. Does not bruise/bleed easily.  Psychiatric/Behavioral: Negative for suicidal ideas, disturbed wake/sleep cycle and dysphoric mood. The patient is not nervous/anxious.        Objective:   Physical Exam  Constitutional: She is oriented to person, place, and time. She appears well-developed and well-nourished. No distress.  HENT:  Head: Normocephalic and atraumatic.  Right Ear: External ear normal.  Left Ear: External ear normal.  Nose: Nose normal.  Mouth/Throat: Oropharynx is clear and moist. No oropharyngeal exudate.  Eyes: Conjunctivae normal are normal. Pupils  are equal, round, and reactive to light. Right eye exhibits no discharge. Left eye exhibits no discharge. No scleral icterus.  Neck: Normal range of motion. Neck supple. No tracheal deviation present. No thyromegaly present.  Cardiovascular: Normal rate, regular rhythm, normal heart sounds and intact distal pulses.  Exam reveals no gallop and no friction rub.   No murmur heard. Pulmonary/Chest: Effort normal and breath sounds normal. No respiratory distress. She has no  wheezes. She has no rales. She exhibits no tenderness.  Musculoskeletal: Normal range of motion. She exhibits no edema and no tenderness.  Lymphadenopathy:    She has no cervical adenopathy.  Neurological: She is alert and oriented to person, place, and time. No cranial nerve deficit. She exhibits normal muscle tone. Coordination normal.  Skin: Skin is warm and dry. No rash noted. She is not diaphoretic. No erythema. No pallor.  Psychiatric: She has a normal mood and affect. Her behavior is normal. Judgment and thought content normal.          Assessment & Plan:

## 2011-12-04 NOTE — Assessment & Plan Note (Signed)
Blood pressure well-controlled on current medications. Will continue. Refills given today. We'll plan to repeat renal function with labs in December 2013.

## 2011-12-04 NOTE — Assessment & Plan Note (Signed)
Patient was seen by colorectal surgeon and referred to urogynecology. She is scheduled for repair of rectal prolapse on October 4.

## 2011-12-18 HISTORY — PX: OTHER SURGICAL HISTORY: SHX169

## 2012-01-12 ENCOUNTER — Ambulatory Visit (INDEPENDENT_AMBULATORY_CARE_PROVIDER_SITE_OTHER): Payer: Medicare Other | Admitting: Internal Medicine

## 2012-01-12 ENCOUNTER — Ambulatory Visit (INDEPENDENT_AMBULATORY_CARE_PROVIDER_SITE_OTHER)
Admission: RE | Admit: 2012-01-12 | Discharge: 2012-01-12 | Disposition: A | Discharge status: Home / Self Care | Payer: Medicare Other | Source: Ambulatory Visit | Attending: Internal Medicine | Admitting: Internal Medicine

## 2012-01-12 ENCOUNTER — Telehealth: Payer: Self-pay | Admitting: Internal Medicine

## 2012-01-12 ENCOUNTER — Encounter: Payer: Self-pay | Admitting: Internal Medicine

## 2012-01-12 VITALS — BP 168/80 | HR 72 | Temp 97.9°F | Wt 158.0 lb

## 2012-01-12 DIAGNOSIS — R0781 Pleurodynia: Secondary | ICD-10-CM

## 2012-01-12 DIAGNOSIS — R079 Chest pain, unspecified: Secondary | ICD-10-CM

## 2012-01-12 DIAGNOSIS — M549 Dorsalgia, unspecified: Secondary | ICD-10-CM

## 2012-01-12 DIAGNOSIS — N189 Chronic kidney disease, unspecified: Secondary | ICD-10-CM

## 2012-01-12 DIAGNOSIS — R109 Unspecified abdominal pain: Secondary | ICD-10-CM

## 2012-01-12 DIAGNOSIS — I10 Essential (primary) hypertension: Secondary | ICD-10-CM

## 2012-01-12 LAB — CBC WITH DIFFERENTIAL/PLATELET
Eosinophils Absolute: 0.2 10*3/uL (ref 0.0–0.7)
Eosinophils Relative: 3.3 % (ref 0.0–5.0)
HCT: 36 % (ref 36.0–46.0)
Lymphs Abs: 1.6 10*3/uL (ref 0.7–4.0)
MCHC: 32.8 g/dL (ref 30.0–36.0)
MCV: 87.9 fl (ref 78.0–100.0)
Monocytes Absolute: 0.7 10*3/uL (ref 0.1–1.0)
Neutrophils Relative %: 51.7 % (ref 43.0–77.0)
Platelets: 221 10*3/uL (ref 150.0–400.0)
WBC: 5 10*3/uL (ref 4.5–10.5)

## 2012-01-12 LAB — URINALYSIS, ROUTINE W REFLEX MICROSCOPIC
Ketones, ur: NEGATIVE
Specific Gravity, Urine: 1.01 (ref 1.000–1.030)
Urine Glucose: NEGATIVE
Urobilinogen, UA: 0.2 (ref 0.0–1.0)
pH: 6 (ref 5.0–8.0)

## 2012-01-12 LAB — BASIC METABOLIC PANEL
BUN: 22 mg/dL (ref 6–23)
Calcium: 9.2 mg/dL (ref 8.4–10.5)
GFR: 35.32 mL/min — ABNORMAL LOW (ref >60.00)
Glucose, Bld: 96 mg/dL (ref 70–99)
Sodium: 138 mEq/L (ref 135–145)

## 2012-01-12 LAB — HEPATIC FUNCTION PANEL
Albumin: 3.7 g/dL (ref 3.5–5.2)
Alkaline Phosphatase: 103 U/L (ref 39–117)
Total Bilirubin: 0.6 mg/dL (ref 0.3–1.2)

## 2012-01-12 MED ORDER — GABAPENTIN 100 MG PO CAPS: 100.0000 mg | ORAL_CAPSULE | Freq: Three times a day (TID) | ORAL | Status: DC

## 2012-01-12 NOTE — Patient Instructions (Addendum)
It was nice meeting you today.  I am sorry you have not been feeling well.  I am going to prescribe neurontin to help with the pain.  We will notify you of your labs and xrays once the results are available.

## 2012-01-12 NOTE — Telephone Encounter (Signed)
Caller: [name]/Patient; Patient Name: Caitlin Lin; PCP: Ronette Deter (Adults only); Best Callback Phone Number: [phone].  Patient calling about left-sided pain under rib cage.  Onset 01/09/12; unable to sleep well.  Pain is con//stant and does   not move.  Pain worse on lying down.  Per abdominal pain protocol, advised appt; appt scheduled with Dr. Nicki Reaper 01/12/12 1000.

## 2012-01-13 ENCOUNTER — Encounter: Payer: Self-pay | Admitting: Internal Medicine

## 2012-01-13 NOTE — Assessment & Plan Note (Signed)
Blood pressure elevated today.  Feel related to the increased pain and not sleeping.  Have her monitor her blood pressure.  Reevaluate.  If persistent elevation may require adjustments in her meds.

## 2012-01-13 NOTE — Assessment & Plan Note (Signed)
Avoid antiinflammatories.

## 2012-01-13 NOTE — Progress Notes (Signed)
Subjective:    Patient ID: Caitlin Lin, female    DOB: 03-30-1940, 71 y.o.   MRN: [id number]  HPI 71 year old female with past history of renal insufficiency and hypertension who comes in today as a work in with concerns regarding increased left side pain.  States she had surgery 12/18/11.  One week after surgery, she noticed pain in her left buttock when she would take a step with her left foot/leg.  Intermittently occurred.  Pain would hit in her left buttock and extend down her left leg.  Only occur with certain movements.  She reports that starting three days ago, she started having pain on her left side.  The area feels numb and extends around to her abdomen (only left side).  Has had no nausea or vomiting.  No bowel change.  She states her bowels are normal for her.  No blood in the stool.  No nausea or vomiting.  Has been eating and drinking well, until the pain started.  States she has not had an appetite over the last couple of days - secondary to the increased pain.  Has not slept well the last two nights.  Heating pad did help some.  She had some oxycodone left over from her surgery.  Took two of these yesterday evening and last night.  Did not help.  Regarding her surgery, she states she has done well.  No abdominal pain.  No urine or vaginal problems.  Denies any dysuria or hematuria. No constipation.  Has follow up with Dr Sharlett Iles in November.    Past Medical History  Diagnosis Date  . Chronic kidney disease     Followed by Dr. Holley Raring  . Spinal stenosis in cervical region     Dr. Sharlet Salina    Review of Systems Patient denies any headache, lightheadedness or dizziness.  No chest pain, tightness or palpatations. No increased shortness of breath, cough or congestion.  No pain with deep breathing.  No nausea or vomiting.  No bowel change, such as diarrhea, constipation, BRBPR or melana.  States her bowels are normal for her.  No urine change.  No vaginal problems.  No fever.  Decreased  appetite, but she does state she is drinking plenty of fluids.      Objective:   Physical Exam Filed Vitals:   01/12/12 1032  BP: 168/80  Pulse: 72  Temp: 97.9 F (25.23 C)   71 year old female in no acute distress.  NECK:  Supple, nontender.   HEART:  Appears to be regular. LUNGS:  Without crackles or wheezing audible.  Respirations even and unlabored.  Good breath sounds bilaterally.  No pain with deep breathing.   RADIAL PULSE:  Equal bilaterally.  ABDOMEN:  Soft.  No significant tenderness to palpation over the abdomen.  Minimal tenderness.  No rebound or guarding.   No audible abdominal bruit.  RIBS:  Tenderness to palpation over the posterior/lateral/anterior ribs.  Some reproducible pain to palpation.   BACK:  No pain to palpation over the back.  EXTREMITIES:  No increased edema to be present.  SKIN:  No rash. NEURO:  Decreased sensation to touch - left abdomen/flank     Assessment & Plan:  PAIN.  Appears to be more localized to the ribs and back.  No significant abdominal pain.  She did have some minimal tenderness to palpation of the abdomen, but this could be attributed to her recent surgery.  No rebound or guarding.  Does have reproducible pain  to palpation over the back/ribs.  She also describes the numb feeling in this same area.  All left side.  No rash.  Have her watch for development of a rash.  Will check labs including cbc, met b and urinalysis.  Will also check back and rib xray.  Oxycodone not helping.  Will start her on Gabapentin - since it seems more nerve related.  Will start 100mg  tid and titrate up as needed.  Discussed at length with her and her husband today.  Explained to them - if her symptoms changed or worsened she was to be reevaluated.  They were comfortable with this plan.   MSK.  See above.  She is also having the pain in her left buttock and left leg - intermittently.  Certain movements will aggravate.  Obtain xray as outlined.

## 2012-01-15 ENCOUNTER — Telehealth: Payer: Self-pay | Admitting: Internal Medicine

## 2012-01-15 NOTE — Telephone Encounter (Signed)
Left message on cell phone voicemail for patient to return call. 

## 2012-01-15 NOTE — Telephone Encounter (Signed)
Caller: Caitlin Lin/Patient; Patient Name: Caitlin Lin; PCP: Ronette Deter (Adults only); Best Callback Phone Number: [phone] States has missed call from office 11-1 pertaining to follow up after seeing Dr. Nicki Reaper. Initially thought had shingles but is not sure. Called to inform Dr. Gilford Rile but missed call as was speaking to insurance company when call received. Pain medicine prescribed 10-29 is helping with discomfort. Per [employer], advised to increase Neurontin to 200mg  tid and needs follow up early next week. States did not receive voicemail.  PLEASE CALL PATIENT AND SCHEDULE FOLLOW UP APPOINTMENT.

## 2012-01-15 NOTE — Telephone Encounter (Signed)
Lets have her increase neurontin to 200mg  tid and set up a follow up early next week.

## 2012-01-15 NOTE — Telephone Encounter (Signed)
Patient advised via message left on cell phone voicemail.  Advised her to call back and schedule follow up appt.

## 2012-01-15 NOTE — Telephone Encounter (Signed)
Patient calling, still has soreness /numbness of the left side at her rib cage.  The sx have not worsened since her Tuesday 10/29 appt.  It is worse at night, throbbing.  She is taking the Neurontin 100mg  tid.  She doesn't have a rash but on the left side there is an area about the size of a quarter with 3 small bumps but not blisters.  No itching.   MD asked her to call in 3 days to give an update.

## 2012-01-19 NOTE — Telephone Encounter (Signed)
Patient advised via telephone, she saw her Dermatologist today and was given Acyclovir because she has the shingles.  Per patients request appt for next week was cancelled.

## 2012-01-27 ENCOUNTER — Ambulatory Visit: Payer: Medicare Other | Admitting: Internal Medicine

## 2012-02-01 ENCOUNTER — Telehealth: Payer: Self-pay | Admitting: Internal Medicine

## 2012-02-01 NOTE — Telephone Encounter (Signed)
Caller: Saumya/Patient; Phone: [phone]; Reason for Call: Pt had major surgery 12/18/11 followed by shingles outbreak 01/08/12.  Pt has no more sx at this time.  She wants to know from MD perspective if it is ok to get her flu shot. Office please call her back.

## 2012-02-01 NOTE — Telephone Encounter (Signed)
Patient advised as instructed via telephone. 

## 2012-02-01 NOTE — Telephone Encounter (Signed)
Should be fine for flu shot assuming no allergy to latex, eggs, or problems with flu vaccine in the past

## 2012-03-07 ENCOUNTER — Encounter: Payer: Self-pay | Admitting: Internal Medicine

## 2012-03-07 ENCOUNTER — Ambulatory Visit: Payer: Medicare Other

## 2012-03-07 ENCOUNTER — Ambulatory Visit (INDEPENDENT_AMBULATORY_CARE_PROVIDER_SITE_OTHER): Payer: Medicare Other | Admitting: Internal Medicine

## 2012-03-07 VITALS — BP 130/78 | HR 86 | Temp 99.3°F | Resp 16 | Wt 159.0 lb

## 2012-03-07 DIAGNOSIS — E039 Hypothyroidism, unspecified: Secondary | ICD-10-CM

## 2012-03-07 DIAGNOSIS — Z1331 Encounter for screening for depression: Secondary | ICD-10-CM

## 2012-03-07 DIAGNOSIS — I10 Essential (primary) hypertension: Secondary | ICD-10-CM

## 2012-03-07 DIAGNOSIS — Z23 Encounter for immunization: Secondary | ICD-10-CM

## 2012-03-07 DIAGNOSIS — R300 Dysuria: Secondary | ICD-10-CM

## 2012-03-07 DIAGNOSIS — D649 Anemia, unspecified: Secondary | ICD-10-CM

## 2012-03-07 DIAGNOSIS — E785 Hyperlipidemia, unspecified: Secondary | ICD-10-CM

## 2012-03-07 DIAGNOSIS — N189 Chronic kidney disease, unspecified: Secondary | ICD-10-CM

## 2012-03-07 DIAGNOSIS — N390 Urinary tract infection, site not specified: Secondary | ICD-10-CM | POA: Insufficient documentation

## 2012-03-07 LAB — POCT URINALYSIS DIPSTICK
Bilirubin, UA: NEGATIVE
Ketones, UA: NEGATIVE
Protein, UA: NEGATIVE
Spec Grav, UA: 1.015
pH, UA: 7

## 2012-03-07 LAB — COMPREHENSIVE METABOLIC PANEL
Albumin: 3.7 g/dL (ref 3.5–5.2)
Alkaline Phosphatase: 98 U/L (ref 39–117)
Calcium: 9.4 mg/dL (ref 8.4–10.5)
Chloride: 102 mEq/L (ref 96–112)
Glucose, Bld: 107 mg/dL — ABNORMAL HIGH (ref 70–99)
Potassium: 4.9 mEq/L (ref 3.5–5.1)
Sodium: 136 mEq/L (ref 135–145)
Total Protein: 6.8 g/dL (ref 6.0–8.3)

## 2012-03-07 LAB — CBC WITH DIFFERENTIAL/PLATELET
Eosinophils Relative: 2.2 % (ref 0.0–5.0)
Lymphocytes Relative: 31.8 % (ref 12.0–46.0)
MCV: 86.7 fl (ref 78.0–100.0)
Monocytes Absolute: 0.7 10*3/uL (ref 0.1–1.0)
Monocytes Relative: 14.9 % — ABNORMAL HIGH (ref 3.0–12.0)
Neutrophils Relative %: 50.5 % (ref 43.0–77.0)
Platelets: 205 10*3/uL (ref 150.0–400.0)
RBC: 3.99 Mil/uL (ref 3.87–5.11)
WBC: 4.7 10*3/uL (ref 4.5–10.5)

## 2012-03-07 LAB — LIPID PANEL
HDL: 44.8 mg/dL (ref >39.00)
LDL Cholesterol: 111 mg/dL — ABNORMAL HIGH (ref 0–99)
Total CHOL/HDL Ratio: 4
VLDL: 21.2 mg/dL (ref 0.0–40.0)

## 2012-03-07 LAB — TSH: TSH: 0.7 u[IU]/mL (ref 0.35–5.50)

## 2012-03-07 MED ORDER — CIPROFLOXACIN HCL 500 MG PO TABS: 500.0000 mg | ORAL_TABLET | Freq: Two times a day (BID) | ORAL | Status: DC

## 2012-03-07 NOTE — Progress Notes (Signed)
Subjective:    Patient ID: Caitlin Lin, female    DOB: 1941/01/28, 71 y.o.   MRN: [id number]  HPI 71 year old female with history of hypertension, hyperlipidemia, chronic kidney disease presents for followup. Patient was last seen in October complaining of chest and flank pain. Ultimately, she developed a rash in this area consistent with shingles. She reports that symptoms have improved. During that time, she was also seen by her dermatologist and found to have melanoma on her left lower leg. This was resected with clear margins per her report. She reports she is generally been doing well since that time. She reports full compliance with her medications. She denies any chest pain, headache, palpitations.  Over the last couple of days, she has noted some pressure in her lower pelvis after urinating. She denies any hematuria, flank pain, fever, chills. She denies any change in urinary frequency.  Outpatient Encounter Prescriptions as of 03/07/2012  Medication Sig Dispense Refill  . amLODipine (NORVASC) 2.5 MG tablet Take 1 tablet (2.5 mg total) by mouth daily.  90 tablet  4  . aspirin EC 81 MG tablet Take 81 mg by mouth daily.        Marland Kitchen atorvastatin (LIPITOR) 10 MG tablet Take 1 tablet (10 mg total) by mouth daily.  90 tablet  4  . CRANBERRY PO Take 3,600 mg by mouth daily.        . DULoxetine (CYMBALTA) 60 MG capsule Take 1 capsule (60 mg total) by mouth daily.  90 capsule  4  . fexofenadine-pseudoephedrine (ALLEGRA-D 24) 180-240 MG per 24 hr tablet Take 1 tablet by mouth daily as needed.        . gabapentin (NEURONTIN) 100 MG capsule Take 1 capsule (100 mg total) by mouth 3 (three) times daily.  90 capsule  0  . levothyroxine (SYNTHROID, LEVOTHROID) 75 MCG tablet Take 1 tablet (75 mcg total) by mouth daily.  90 tablet  4  . Multiple Vitamins-Minerals (CENTRUM SILVER ULTRA WOMENS) TABS Take 1 tablet by mouth daily.        Marland Kitchen omeprazole (PRILOSEC) 20 MG capsule Take 1 capsule (20 mg total) by  mouth daily.  90 capsule  4  . quinapril (ACCUPRIL) 20 MG tablet Take 1 tablet (20 mg total) by mouth daily.  90 tablet  4  . Cholecalciferol (VITAMIN D) 1000 UNITS capsule Take 1,000 Units by mouth daily.        . Coenzyme Q10 (CO Q 10 PO) Take 2 tablets by mouth daily. W/Fish Oil Omega 3 1290-900 mg.       BP 130/78  Pulse 86  Temp 99.3 F (37.4 C) (Oral)  Resp 16  Wt 159 lb (72.122 kg)  Review of Systems  Constitutional: Negative for fever, chills, appetite change, fatigue and unexpected weight change.  HENT: Negative for ear pain, congestion, sore throat, trouble swallowing, neck pain, voice change and sinus pressure.   Eyes: Negative for visual disturbance.  Respiratory: Negative for cough, shortness of breath, wheezing and stridor.   Cardiovascular: Negative for chest pain, palpitations and leg swelling.  Gastrointestinal: Negative for nausea, vomiting, abdominal pain, diarrhea, constipation, blood in stool, abdominal distention and anal bleeding.  Genitourinary: Negative for dysuria and flank pain.  Musculoskeletal: Negative for myalgias, arthralgias and gait problem.  Skin: Negative for color change and rash.  Neurological: Negative for dizziness and headaches.  Hematological: Negative for adenopathy. Does not bruise/bleed easily.  Psychiatric/Behavioral: Negative for suicidal ideas, sleep disturbance and dysphoric mood. The patient is  not nervous/anxious.        Objective:   Physical Exam  Constitutional: She is oriented to person, place, and time. She appears well-developed and well-nourished. No distress.  HENT:  Head: Normocephalic and atraumatic.  Right Ear: External ear normal.  Left Ear: External ear normal.  Nose: Nose normal.  Mouth/Throat: Oropharynx is clear and moist. No oropharyngeal exudate.  Eyes: Conjunctivae normal are normal. Pupils are equal, round, and reactive to light. Right eye exhibits no discharge. Left eye exhibits no discharge. No scleral  icterus.  Neck: Normal range of motion. Neck supple. No tracheal deviation present. No thyromegaly present.  Cardiovascular: Normal rate, regular rhythm, normal heart sounds and intact distal pulses.  Exam reveals no gallop and no friction rub.   No murmur heard. Pulmonary/Chest: Effort normal and breath sounds normal. No respiratory distress. She has no wheezes. She has no rales. She exhibits no tenderness.  Musculoskeletal: Normal range of motion. She exhibits no edema and no tenderness.  Lymphadenopathy:    She has no cervical adenopathy.  Neurological: She is alert and oriented to person, place, and time. No cranial nerve deficit. She exhibits normal muscle tone. Coordination normal.  Skin: Skin is warm and dry. No rash noted. She is not diaphoretic. No erythema. No pallor.  Psychiatric: She has a normal mood and affect. Her behavior is normal. Judgment and thought content normal.          Assessment & Plan:

## 2012-03-07 NOTE — Assessment & Plan Note (Signed)
Will check renal function with labs today. 

## 2012-03-07 NOTE — Assessment & Plan Note (Signed)
Blood pressure well-controlled on current medications. Will continue. Will check renal function with labs today. Followup in 3 months or sooner as needed.

## 2012-03-07 NOTE — Addendum Note (Signed)
Addended by: Kris Hartmann on: 03/07/2012 04:52 PM   Modules accepted: Orders

## 2012-03-07 NOTE — Assessment & Plan Note (Signed)
Symptoms and urinalysis are consistent with urinary tract infection. Will treat with Cipro twice daily x7 days. Will send urine for culture. Patient will followup if symptoms are not improving.

## 2012-03-08 LAB — FERRITIN: Ferritin: 64.7 ng/mL (ref 10.0–291.0)

## 2012-03-18 ENCOUNTER — Other Ambulatory Visit: Payer: Medicare Other

## 2012-03-18 LAB — FECAL OCCULT BLOOD, IMMUNOCHEMICAL: Fecal Occult Bld: NEGATIVE

## 2012-06-08 ENCOUNTER — Encounter: Payer: Self-pay | Admitting: Internal Medicine

## 2012-06-08 ENCOUNTER — Ambulatory Visit (INDEPENDENT_AMBULATORY_CARE_PROVIDER_SITE_OTHER): Payer: Medicare Other | Admitting: Internal Medicine

## 2012-06-08 VITALS — BP 116/78 | HR 80 | Temp 98.7°F | Wt 155.0 lb

## 2012-06-08 DIAGNOSIS — R6889 Other general symptoms and signs: Secondary | ICD-10-CM

## 2012-06-08 DIAGNOSIS — E039 Hypothyroidism, unspecified: Secondary | ICD-10-CM

## 2012-06-08 DIAGNOSIS — D509 Iron deficiency anemia, unspecified: Secondary | ICD-10-CM | POA: Insufficient documentation

## 2012-06-08 DIAGNOSIS — I10 Essential (primary) hypertension: Secondary | ICD-10-CM

## 2012-06-08 LAB — COMPREHENSIVE METABOLIC PANEL
ALT: 22 U/L (ref 0–35)
AST: 24 U/L (ref 0–37)
Alkaline Phosphatase: 82 U/L (ref 39–117)
BUN: 30 mg/dL — ABNORMAL HIGH (ref 6–23)
Creatinine, Ser: 1.7 mg/dL — ABNORMAL HIGH (ref 0.4–1.2)
Potassium: 5.2 mEq/L — ABNORMAL HIGH (ref 3.5–5.1)
Sodium: 139 mEq/L (ref 135–145)
Total Bilirubin: 0.8 mg/dL (ref 0.3–1.2)

## 2012-06-08 LAB — CBC WITH DIFFERENTIAL/PLATELET
Basophils Relative: 0.6 % (ref 0.0–3.0)
Eosinophils Relative: 0.8 % (ref 0.0–5.0)
Lymphocytes Relative: 32.8 % (ref 12.0–46.0)
MCV: 85.6 fl (ref 78.0–100.0)
Neutrophils Relative %: 53.3 % (ref 43.0–77.0)
RBC: 4.18 Mil/uL (ref 3.87–5.11)
WBC: 4.4 10*3/uL — ABNORMAL LOW (ref 4.5–10.5)

## 2012-06-08 NOTE — Progress Notes (Signed)
Subjective:    Patient ID: Caitlin Lin, female    DOB: 1941-02-09, 72 y.o.   MRN: [id number]  HPI 72 year old female with history of hypothyroidism, hypertension, anemia presents for followup. Her only concern today is feeling cold all the time. This is been ongoing for a couple of months. She reports that in the past as indicated thyroid issues. She denies fatigue or constipation. She otherwise reports she is feeling well. She is compliant with her medications.  Outpatient Encounter Prescriptions as of 06/08/2012  Medication Sig Dispense Refill  . amLODipine (NORVASC) 2.5 MG tablet Take 1 tablet (2.5 mg total) by mouth daily.  90 tablet  4  . aspirin EC 81 MG tablet Take 81 mg by mouth daily.        Marland Kitchen atorvastatin (LIPITOR) 10 MG tablet Take 1 tablet (10 mg total) by mouth daily.  90 tablet  4  . CRANBERRY PO Take 3,600 mg by mouth daily.        . DULoxetine (CYMBALTA) 60 MG capsule Take 1 capsule (60 mg total) by mouth daily.  90 capsule  4  . fexofenadine-pseudoephedrine (ALLEGRA-D 24) 180-240 MG per 24 hr tablet Take 1 tablet by mouth daily as needed.        Marland Kitchen glucosamine-chondroitin 500-400 MG tablet Take 1 tablet by mouth 3 (three) times daily.      Marland Kitchen levothyroxine (SYNTHROID, LEVOTHROID) 75 MCG tablet Take 1 tablet (75 mcg total) by mouth daily.  90 tablet  4  . Multiple Vitamins-Minerals (CENTRUM SILVER ULTRA WOMENS) TABS Take 1 tablet by mouth daily.        Marland Kitchen omeprazole (PRILOSEC) 20 MG capsule Take 1 capsule (20 mg total) by mouth daily.  90 capsule  4  . quinapril (ACCUPRIL) 20 MG tablet Take 1 tablet (20 mg total) by mouth daily.  90 tablet  4  . Cholecalciferol (VITAMIN D) 1000 UNITS capsule Take 1,000 Units by mouth daily.        . ciprofloxacin (CIPRO) 500 MG tablet Take 1 tablet (500 mg total) by mouth 2 (two) times daily.  14 tablet  0  . Coenzyme Q10 (CO Q 10 PO) Take 2 tablets by mouth daily. W/Fish Oil Omega 3 1290-900 mg.      . gabapentin (NEURONTIN) 100 MG capsule Take  1 capsule (100 mg total) by mouth 3 (three) times daily.  90 capsule  0  . HYDROcodone-acetaminophen (NORCO/VICODIN) 5-325 MG per tablet        No facility-administered encounter medications on file as of 06/08/2012.   BP 116/78  Pulse 80  Temp(Src) 98.7 F (37.1 C) (Oral)  Wt 155 lb (70.308 kg)  BMI 26.82 kg/m2  SpO2 96%  Review of Systems  Constitutional: Negative for fever, chills, appetite change, fatigue and unexpected weight change.  HENT: Negative for ear pain, congestion, sore throat, trouble swallowing, neck pain, voice change and sinus pressure.   Eyes: Negative for visual disturbance.  Respiratory: Negative for cough, shortness of breath, wheezing and stridor.   Cardiovascular: Negative for chest pain, palpitations and leg swelling.  Gastrointestinal: Negative for nausea, vomiting, abdominal pain, diarrhea, constipation, blood in stool, abdominal distention and anal bleeding.  Endocrine: Positive for cold intolerance.  Genitourinary: Negative for dysuria and flank pain.  Musculoskeletal: Negative for myalgias, arthralgias and gait problem.  Skin: Negative for color change and rash.  Neurological: Negative for dizziness and headaches.  Hematological: Negative for adenopathy. Does not bruise/bleed easily.  Psychiatric/Behavioral: Negative for suicidal ideas, sleep disturbance  and dysphoric mood. The patient is not nervous/anxious.        Objective:   Physical Exam  Constitutional: She is oriented to person, place, and time. She appears well-developed and well-nourished. No distress.  HENT:  Head: Normocephalic and atraumatic.  Right Ear: External ear normal.  Left Ear: External ear normal.  Nose: Nose normal.  Mouth/Throat: Oropharynx is clear and moist. No oropharyngeal exudate.  Eyes: Conjunctivae are normal. Pupils are equal, round, and reactive to light. Right eye exhibits no discharge. Left eye exhibits no discharge. No scleral icterus.  Neck: Normal range of  motion. Neck supple. No tracheal deviation present. No thyromegaly present.  Cardiovascular: Normal rate, regular rhythm, normal heart sounds and intact distal pulses.  Exam reveals no gallop and no friction rub.   No murmur heard. Pulmonary/Chest: Effort normal and breath sounds normal. No respiratory distress. She has no wheezes. She has no rales. She exhibits no tenderness.  Musculoskeletal: Normal range of motion. She exhibits no edema and no tenderness.  Lymphadenopathy:    She has no cervical adenopathy.  Neurological: She is alert and oriented to person, place, and time. No cranial nerve deficit. She exhibits normal muscle tone. Coordination normal.  Skin: Skin is warm and dry. No rash noted. She is not diaphoretic. No erythema. No pallor.  Psychiatric: She has a normal mood and affect. Her behavior is normal. Judgment and thought content normal.          Assessment & Plan:

## 2012-06-08 NOTE — Assessment & Plan Note (Signed)
BP Readings from Last 3 Encounters:  06/08/12 116/78  03/07/12 130/78  01/12/12 168/80   BP well controlled on current medications. Will continue. Will check renal function with labs.

## 2012-06-08 NOTE — Assessment & Plan Note (Signed)
Symptomatic with temperature intolerance. Will check TSH with labs.

## 2012-06-08 NOTE — Assessment & Plan Note (Signed)
H/o iron def anemia. Will check CBC with labs today.

## 2012-06-08 NOTE — Assessment & Plan Note (Signed)
Symptoms are most suggestive of thyroid dysfunction. Will check TSH with labs today. We'll also check CBC and ferritin levels given history of iron deficiency.

## 2012-06-10 ENCOUNTER — Other Ambulatory Visit (INDEPENDENT_AMBULATORY_CARE_PROVIDER_SITE_OTHER): Payer: Medicare Other

## 2012-06-10 DIAGNOSIS — D649 Anemia, unspecified: Secondary | ICD-10-CM

## 2012-06-10 LAB — BASIC METABOLIC PANEL
BUN: 26 mg/dL — ABNORMAL HIGH (ref 6–23)
CO2: 26 mEq/L (ref 19–32)
Chloride: 103 mEq/L (ref 96–112)
Creatinine, Ser: 1.6 mg/dL — ABNORMAL HIGH (ref 0.4–1.2)
Potassium: 5.1 mEq/L (ref 3.5–5.1)

## 2012-08-24 ENCOUNTER — Emergency Department: Payer: Self-pay

## 2012-08-24 ENCOUNTER — Ambulatory Visit (INDEPENDENT_AMBULATORY_CARE_PROVIDER_SITE_OTHER): Payer: Medicare Other | Admitting: Internal Medicine

## 2012-08-24 ENCOUNTER — Encounter: Payer: Self-pay | Admitting: Internal Medicine

## 2012-08-24 VITALS — BP 110/72 | HR 84 | Temp 99.0°F | Wt 161.0 lb

## 2012-08-24 DIAGNOSIS — S61451A Open bite of right hand, initial encounter: Secondary | ICD-10-CM

## 2012-08-24 DIAGNOSIS — S61409A Unspecified open wound of unspecified hand, initial encounter: Secondary | ICD-10-CM

## 2012-08-24 DIAGNOSIS — IMO0001 Reserved for inherently not codable concepts without codable children: Secondary | ICD-10-CM

## 2012-08-24 DIAGNOSIS — S61459A Open bite of unspecified hand, initial encounter: Secondary | ICD-10-CM | POA: Insufficient documentation

## 2012-08-24 LAB — BASIC METABOLIC PANEL
Anion Gap: 5 — ABNORMAL LOW (ref 7–16)
BUN: 24 mg/dL — ABNORMAL HIGH (ref 7–18)
Calcium, Total: 9.1 mg/dL (ref 8.5–10.1)
Chloride: 107 mmol/L (ref 98–107)
Co2: 28 mmol/L (ref 21–32)
Potassium: 4 mmol/L (ref 3.5–5.1)

## 2012-08-24 LAB — CBC
HCT: 35.6 % (ref 35.0–47.0)
MCHC: 34.3 g/dL (ref 32.0–36.0)
MCV: 87 fL (ref 80–100)
Platelet: 184 10*3/uL (ref 150–440)
RDW: 13.4 % (ref 11.5–14.5)
WBC: 7.7 10*3/uL (ref 3.6–11.0)

## 2012-08-24 NOTE — Progress Notes (Signed)
Subjective:    Patient ID: Caitlin Lin, female    DOB: Jan 14, 1941, 72 y.o.   MRN: [id number]  HPI 72 year old female presents for acute visit complaining of cat bite. She was working with a cat at a local shelter yesterday when she was bitten on her right hand. Overnight, she reports fever and chills. The bite site is now painful and swollen.She developed redness which is now extending up her right arm.  She has not been taking anything for pain. She is up-to-date on her tetanus shot. The cat was immunized.  Outpatient Encounter Prescriptions as of 08/24/2012  Medication Sig Dispense Refill  . amLODipine (NORVASC) 2.5 MG tablet Take 1 tablet (2.5 mg total) by mouth daily.  90 tablet  4  . aspirin EC 81 MG tablet Take 81 mg by mouth daily.        Marland Kitchen atorvastatin (LIPITOR) 10 MG tablet Take 1 tablet (10 mg total) by mouth daily.  90 tablet  4  . Cholecalciferol (VITAMIN D) 1000 UNITS capsule Take 1,000 Units by mouth daily.        . Coenzyme Q10 (CO Q 10 PO) Take 2 tablets by mouth daily. W/Fish Oil Omega 3 1290-900 mg.      . CRANBERRY PO Take 3,600 mg by mouth daily.        . DULoxetine (CYMBALTA) 60 MG capsule Take 1 capsule (60 mg total) by mouth daily.  90 capsule  4  . fexofenadine-pseudoephedrine (ALLEGRA-D 24) 180-240 MG per 24 hr tablet Take 1 tablet by mouth daily as needed.        . gabapentin (NEURONTIN) 100 MG capsule Take 1 capsule (100 mg total) by mouth 3 (three) times daily.  90 capsule  0  . glucosamine-chondroitin 500-400 MG tablet Take 1 tablet by mouth 3 (three) times daily.      Marland Kitchen HYDROcodone-acetaminophen (NORCO/VICODIN) 5-325 MG per tablet       . levothyroxine (SYNTHROID, LEVOTHROID) 75 MCG tablet Take 1 tablet (75 mcg total) by mouth daily.  90 tablet  4  . Multiple Vitamins-Minerals (CENTRUM SILVER ULTRA WOMENS) TABS Take 1 tablet by mouth daily.        Marland Kitchen omeprazole (PRILOSEC) 20 MG capsule Take 1 capsule (20 mg total) by mouth daily.  90 capsule  4  . quinapril  (ACCUPRIL) 20 MG tablet Take 1 tablet (20 mg total) by mouth daily.  90 tablet  4  . [DISCONTINUED] ciprofloxacin (CIPRO) 500 MG tablet Take 1 tablet (500 mg total) by mouth 2 (two) times daily.  14 tablet  0   No facility-administered encounter medications on file as of 08/24/2012.   BP 110/72  Pulse 84  Temp(Src) 99 F (37.2 C) (Oral)  Wt 161 lb (73.029 kg)  BMI 27.86 kg/m2  SpO2 98%  Review of Systems  Constitutional: Positive for fever and chills. Negative for appetite change, fatigue and unexpected weight change.  HENT: Negative for ear pain, congestion, sore throat, trouble swallowing, neck pain, voice change and sinus pressure.   Eyes: Negative for visual disturbance.  Respiratory: Negative for cough, shortness of breath, wheezing and stridor.   Cardiovascular: Negative for chest pain, palpitations and leg swelling.  Gastrointestinal: Negative for nausea, vomiting, abdominal pain, diarrhea, constipation, blood in stool, abdominal distention and anal bleeding.  Genitourinary: Negative for dysuria and flank pain.  Musculoskeletal: Positive for myalgias, joint swelling and arthralgias. Negative for gait problem.  Skin: Positive for color change and wound. Negative for rash.  Neurological: Negative for  dizziness and headaches.  Hematological: Negative for adenopathy. Does not bruise/bleed easily.  Psychiatric/Behavioral: Negative for suicidal ideas, sleep disturbance and dysphoric mood. The patient is not nervous/anxious.        Objective:   Physical Exam  Constitutional: She is oriented to person, place, and time. She appears well-developed and well-nourished. No distress.  HENT:  Head: Normocephalic and atraumatic.  Right Ear: External ear normal.  Left Ear: External ear normal.  Nose: Nose normal.  Mouth/Throat: Oropharynx is clear and moist.  Eyes: Conjunctivae are normal. Pupils are equal, round, and reactive to light. Right eye exhibits no discharge. Left eye exhibits no  discharge. No scleral icterus.  Neck: Normal range of motion. Neck supple. No tracheal deviation present. No thyromegaly present.  Pulmonary/Chest: Effort normal. No accessory muscle usage. Not tachypneic. She has no decreased breath sounds. She has no rhonchi.  Musculoskeletal: Normal range of motion. She exhibits no edema and no tenderness.       Right hand: She exhibits tenderness and swelling.       Hands: Lymphadenopathy:    She has no cervical adenopathy.  Neurological: She is alert and oriented to person, place, and time. No cranial nerve deficit. She exhibits normal muscle tone. Coordination normal.  Skin: Skin is warm and dry. No rash noted. She is not diaphoretic. No erythema. No pallor.  Psychiatric: She has a normal mood and affect. Her behavior is normal. Judgment and thought content normal.          Assessment & Plan:

## 2012-08-24 NOTE — Assessment & Plan Note (Signed)
Cat bite of the right hand with underlying fluctuance, and induration extending up right arm. Given exam findings and presence of fever, will send pt to ED for evaluation to including imaging of right hand to evaluate abscess and IV antibiotics. Pt will follow up here tomorrow or on discharge.

## 2012-08-25 ENCOUNTER — Encounter: Payer: Self-pay | Admitting: Internal Medicine

## 2012-08-25 ENCOUNTER — Ambulatory Visit (INDEPENDENT_AMBULATORY_CARE_PROVIDER_SITE_OTHER): Payer: Medicare Other | Admitting: Internal Medicine

## 2012-08-25 VITALS — BP 120/70 | HR 72 | Temp 98.4°F | Wt 159.0 lb

## 2012-08-25 DIAGNOSIS — S61451S Open bite of right hand, sequela: Secondary | ICD-10-CM

## 2012-08-25 DIAGNOSIS — IMO0002 Reserved for concepts with insufficient information to code with codable children: Secondary | ICD-10-CM

## 2012-08-25 NOTE — Progress Notes (Signed)
  Subjective:    Patient ID: Caitlin Lin, female    DOB: 1941/01/17, 72 y.o.   MRN: [id number]  HPI 72YO female presents to follow up recent cat bite. Doing well. Seen in ED last night, had single dose IV antibiotics. No recurrent fever, chills. Pain and redness improving.  Outpatient Encounter Prescriptions as of 08/25/2012  Medication Sig Dispense Refill  . amLODipine (NORVASC) 2.5 MG tablet Take 1 tablet (2.5 mg total) by mouth daily.  90 tablet  4  . amoxicillin-clavulanate (AUGMENTIN) 875-125 MG per tablet       . aspirin EC 81 MG tablet Take 81 mg by mouth daily.        Marland Kitchen atorvastatin (LIPITOR) 10 MG tablet Take 1 tablet (10 mg total) by mouth daily.  90 tablet  4  . Cholecalciferol (VITAMIN D) 1000 UNITS capsule Take 1,000 Units by mouth daily.        . Coenzyme Q10 (CO Q 10 PO) Take 2 tablets by mouth daily. W/Fish Oil Omega 3 1290-900 mg.      . CRANBERRY PO Take 3,600 mg by mouth daily.        . DULoxetine (CYMBALTA) 60 MG capsule Take 1 capsule (60 mg total) by mouth daily.  90 capsule  4  . fexofenadine-pseudoephedrine (ALLEGRA-D 24) 180-240 MG per 24 hr tablet Take 1 tablet by mouth daily as needed.        . gabapentin (NEURONTIN) 100 MG capsule Take 1 capsule (100 mg total) by mouth 3 (three) times daily.  90 capsule  0  . glucosamine-chondroitin 500-400 MG tablet Take 1 tablet by mouth 3 (three) times daily.      Marland Kitchen HYDROcodone-acetaminophen (NORCO/VICODIN) 5-325 MG per tablet       . levothyroxine (SYNTHROID, LEVOTHROID) 75 MCG tablet Take 1 tablet (75 mcg total) by mouth daily.  90 tablet  4  . Multiple Vitamins-Minerals (CENTRUM SILVER ULTRA WOMENS) TABS Take 1 tablet by mouth daily.        Marland Kitchen omeprazole (PRILOSEC) 20 MG capsule Take 1 capsule (20 mg total) by mouth daily.  90 capsule  4  . quinapril (ACCUPRIL) 20 MG tablet Take 1 tablet (20 mg total) by mouth daily.  90 tablet  4   No facility-administered encounter medications on file as of 08/25/2012.    Review of  Systems  Constitutional: Negative for fever, chills and diaphoresis.  Musculoskeletal: Positive for arthralgias.  Skin: Positive for color change and wound.       Objective:   Physical Exam  Constitutional: She appears well-developed and well-nourished. No distress.  HENT:  Head: Normocephalic and atraumatic.  Eyes: Pupils are equal, round, and reactive to light.  Neck: Normal range of motion.  Pulmonary/Chest: Effort normal.  Skin: Skin is warm. She is not diaphoretic. There is erythema.             Assessment & Plan:

## 2012-08-25 NOTE — Assessment & Plan Note (Signed)
Symptoms and exam markedly improved after IV antibiotics in ED. Will continue Augmentin. Will keep close eye on nodular area around medial puncture site. Discussed that this area may need debrided if does not clear with antibiotics. Follow up Monday, or will email over the weekend if any changes or concerns.

## 2012-08-29 ENCOUNTER — Ambulatory Visit (INDEPENDENT_AMBULATORY_CARE_PROVIDER_SITE_OTHER): Payer: Medicare Other | Admitting: Internal Medicine

## 2012-08-29 ENCOUNTER — Encounter: Payer: Self-pay | Admitting: Internal Medicine

## 2012-08-29 VITALS — BP 126/80 | HR 66 | Temp 98.7°F | Wt 160.0 lb

## 2012-08-29 DIAGNOSIS — Z5189 Encounter for other specified aftercare: Secondary | ICD-10-CM

## 2012-08-29 DIAGNOSIS — S61451D Open bite of right hand, subsequent encounter: Secondary | ICD-10-CM

## 2012-08-29 NOTE — Progress Notes (Signed)
  Subjective:    Patient ID: Caitlin Lin, female    DOB: 1940/09/21, 72 y.o.   MRN: [id number]  HPI 72YO female s/p cat bite right thumb presents for recheck wound. Doing well no fever, chills. Pain resolved. No further redness at site. Compliant with antibiotics. No new concerns.  Outpatient Encounter Prescriptions as of 08/29/2012  Medication Sig Dispense Refill  . amLODipine (NORVASC) 2.5 MG tablet Take 1 tablet (2.5 mg total) by mouth daily.  90 tablet  4  . amoxicillin-clavulanate (AUGMENTIN) 875-125 MG per tablet       . aspirin EC 81 MG tablet Take 81 mg by mouth daily.        Marland Kitchen atorvastatin (LIPITOR) 10 MG tablet Take 1 tablet (10 mg total) by mouth daily.  90 tablet  4  . Cholecalciferol (VITAMIN D) 1000 UNITS capsule Take 1,000 Units by mouth daily.        . Coenzyme Q10 (CO Q 10 PO) Take 2 tablets by mouth daily. W/Fish Oil Omega 3 1290-900 mg.      . CRANBERRY PO Take 3,600 mg by mouth daily.        . DULoxetine (CYMBALTA) 60 MG capsule Take 1 capsule (60 mg total) by mouth daily.  90 capsule  4  . fexofenadine-pseudoephedrine (ALLEGRA-D 24) 180-240 MG per 24 hr tablet Take 1 tablet by mouth daily as needed.        . gabapentin (NEURONTIN) 100 MG capsule Take 1 capsule (100 mg total) by mouth 3 (three) times daily.  90 capsule  0  . glucosamine-chondroitin 500-400 MG tablet Take 1 tablet by mouth 3 (three) times daily.      Marland Kitchen HYDROcodone-acetaminophen (NORCO/VICODIN) 5-325 MG per tablet       . levothyroxine (SYNTHROID, LEVOTHROID) 75 MCG tablet Take 1 tablet (75 mcg total) by mouth daily.  90 tablet  4  . Multiple Vitamins-Minerals (CENTRUM SILVER ULTRA WOMENS) TABS Take 1 tablet by mouth daily.        Marland Kitchen omeprazole (PRILOSEC) 20 MG capsule Take 1 capsule (20 mg total) by mouth daily.  90 capsule  4  . quinapril (ACCUPRIL) 20 MG tablet Take 1 tablet (20 mg total) by mouth daily.  90 tablet  4   No facility-administered encounter medications on file as of 08/29/2012.   BP 126/80   Pulse 66  Temp(Src) 98.7 F (37.1 C) (Oral)  Wt 160 lb (72.576 kg)  BMI 27.69 kg/m2  SpO2 95%  Review of Systems  Constitutional: Negative for fever, chills and fatigue.  Musculoskeletal: Negative for myalgias and arthralgias.  Skin: Positive for wound. Negative for color change.       Objective:   Physical Exam  Constitutional: She is oriented to person, place, and time. She appears well-developed and well-nourished. No distress.  Pulmonary/Chest: Effort normal.  Musculoskeletal: Normal range of motion. She exhibits no edema and no tenderness.  Neurological: She is alert and oriented to person, place, and time.  Skin: Lesion noted. She is not diaphoretic. No erythema.             Assessment & Plan:

## 2012-08-29 NOTE — Assessment & Plan Note (Signed)
Wound improved with no further induration. Will completed 10 day course Augmentin. Follow up 1 week for physical exam and prn.

## 2012-09-05 ENCOUNTER — Ambulatory Visit: Payer: Self-pay | Admitting: Internal Medicine

## 2012-09-05 ENCOUNTER — Encounter: Payer: Self-pay | Admitting: Internal Medicine

## 2012-09-05 ENCOUNTER — Ambulatory Visit (INDEPENDENT_AMBULATORY_CARE_PROVIDER_SITE_OTHER): Payer: Medicare Other | Admitting: Internal Medicine

## 2012-09-05 VITALS — BP 110/68 | HR 75 | Temp 98.8°F | Ht 63.5 in | Wt 160.0 lb

## 2012-09-05 DIAGNOSIS — I10 Essential (primary) hypertension: Secondary | ICD-10-CM

## 2012-09-05 DIAGNOSIS — Z0000 Encounter for general adult medical examination without abnormal findings: Secondary | ICD-10-CM | POA: Insufficient documentation

## 2012-09-05 DIAGNOSIS — E039 Hypothyroidism, unspecified: Secondary | ICD-10-CM

## 2012-09-05 DIAGNOSIS — D509 Iron deficiency anemia, unspecified: Secondary | ICD-10-CM

## 2012-09-05 LAB — HM MAMMOGRAPHY

## 2012-09-05 NOTE — Assessment & Plan Note (Signed)
General medical exam including breast exam normal today. Pap and pelvic deferred given patient age and status post hysterectomy. Mammogram was completed today and results are pending. Colonoscopy is up-to-date. Immunizations are up to date. Appropriate screening performed. Will check labs including CBC, CMP, lipid profile.

## 2012-09-05 NOTE — Progress Notes (Signed)
Subjective:    Patient ID: Caitlin Lin, female    DOB: 10/14/1940, 72 y.o.   MRN: [id number]  HPI The patient is here for annual Medicare wellness examination and management of other chronic and acute problems.   The risk factors are reflected in the social history.  The roster of all physicians providing medical care to patient - is listed in the Snapshot section of the chart.  Activities of daily living:  The patient is 100% independent in all ADLs: dressing, toileting, feeding as well as independent mobility  Home safety : The patient has smoke detectors in the home. They wear seatbelts.  There are no firearms at home. There is no violence in the home.   There is no risks for hepatitis, STDs or HIV. There is no history of blood transfusion. They have no travel history to infectious disease endemic areas of the world.  The patient has seen their dentist in the last six month [facility], Dr. Terance Hart). They have seen their eye doctor in the last year (Dr. Chong Sicilian). They admit to slight hearing difficulty with regard to whispered voices.  They have deferred audiologic testing in the last year.   They do not have excessive sun exposure. Discussed the need for sun protection: hats, long sleeves and use of sunscreen if there is significant sun exposure. (Derm - Dr. Aubery Lapping)  Diet: the importance of a healthy diet is discussed. They do have a relatively healthy diet. Increased fruits, vegs.  The benefits of regular aerobic exercise were discussed. [employer] 3 x per week, 1 hr treadmill/machines and 1 hr class.  Depression screen: there are no signs or vegative symptoms of depression- irritability, change in appetite, anhedonia, sadness/tearfullness.  Cognitive assessment: the patient manages all their financial and personal affairs and is actively engaged. They could relate day,date,year and events.  The following portions of the patient's history were reviewed and updated as  appropriate: allergies, current medications, past family history, past medical history,  past surgical history, past social history  and problem list.  Advances directives - HCPOA daughter, Foye Spurling.   Visual acuity was not assessed per patient preference since she has regular follow up with her ophthalmologist. Hearing and body mass index were assessed and reviewed.   During the course of the visit the patient was educated and counseled about appropriate screening and preventive services including : fall prevention , diabetes screening, nutrition counseling, colorectal cancer screening, and recommended immunizations.    Outpatient Encounter Prescriptions as of 09/05/2012  Medication Sig Dispense Refill  . amLODipine (NORVASC) 2.5 MG tablet Take 1 tablet (2.5 mg total) by mouth daily.  90 tablet  4  . aspirin EC 81 MG tablet Take 81 mg by mouth daily.        Marland Kitchen atorvastatin (LIPITOR) 10 MG tablet Take 1 tablet (10 mg total) by mouth daily.  90 tablet  4  . CRANBERRY PO Take 3,600 mg by mouth daily.        . DULoxetine (CYMBALTA) 60 MG capsule Take 1 capsule (60 mg total) by mouth daily.  90 capsule  4  . fexofenadine-pseudoephedrine (ALLEGRA-D 24) 180-240 MG per 24 hr tablet Take 1 tablet by mouth daily as needed.        Marland Kitchen glucosamine-chondroitin 500-400 MG tablet Take 1 tablet by mouth 3 (three) times daily.      Marland Kitchen levothyroxine (SYNTHROID, LEVOTHROID) 75 MCG tablet Take 1 tablet (75 mcg total) by mouth daily.  90 tablet  4  .  Multiple Vitamins-Minerals (CENTRUM SILVER ULTRA WOMENS) TABS Take 1 tablet by mouth daily.        Marland Kitchen omeprazole (PRILOSEC) 20 MG capsule Take 1 capsule (20 mg total) by mouth daily.  90 capsule  4  . quinapril (ACCUPRIL) 20 MG tablet Take 1 tablet (20 mg total) by mouth daily.  90 tablet  4  . Cholecalciferol (VITAMIN D) 1000 UNITS capsule Take 1,000 Units by mouth daily.        . Coenzyme Q10 (CO Q 10 PO) Take 2 tablets by mouth daily. W/Fish Oil Omega 3 1290-900 mg.       . gabapentin (NEURONTIN) 100 MG capsule Take 1 capsule (100 mg total) by mouth 3 (three) times daily.  90 capsule  0   No facility-administered encounter medications on file as of 09/05/2012.   BP 110/68  Pulse 75  Temp(Src) 98.8 F (37.1 C) (Oral)  Ht 5' 3.5" (1.613 m)  Wt 160 lb (72.576 kg)  BMI 27.89 kg/m2  SpO2 98%  Review of Systems  Constitutional: Negative for fever, chills, appetite change, fatigue and unexpected weight change.  HENT: Negative for ear pain, congestion, sore throat, trouble swallowing, neck pain, voice change and sinus pressure.   Eyes: Negative for visual disturbance.  Respiratory: Negative for cough, shortness of breath, wheezing and stridor.   Cardiovascular: Negative for chest pain, palpitations and leg swelling.  Gastrointestinal: Negative for nausea, vomiting, abdominal pain, diarrhea, constipation, blood in stool, abdominal distention and anal bleeding.  Genitourinary: Negative for dysuria and flank pain.  Musculoskeletal: Negative for myalgias, arthralgias and gait problem.  Skin: Negative for color change and rash.  Neurological: Negative for dizziness and headaches.  Hematological: Negative for adenopathy. Does not bruise/bleed easily.  Psychiatric/Behavioral: Negative for suicidal ideas, sleep disturbance and dysphoric mood. The patient is not nervous/anxious.        Objective:   Physical Exam  Constitutional: She is oriented to person, place, and time. She appears well-developed and well-nourished. No distress.  HENT:  Head: Normocephalic and atraumatic.  Right Ear: External ear normal.  Left Ear: External ear normal.  Nose: Nose normal.  Mouth/Throat: Oropharynx is clear and moist. No oropharyngeal exudate.  Eyes: Conjunctivae are normal. Pupils are equal, round, and reactive to light. Right eye exhibits no discharge. Left eye exhibits no discharge. No scleral icterus.  Neck: Normal range of motion. Neck supple. No tracheal deviation  present. No thyromegaly present.  Cardiovascular: Normal rate, regular rhythm, normal heart sounds and intact distal pulses.  Exam reveals no gallop and no friction rub.   No murmur heard. Pulmonary/Chest: Effort normal and breath sounds normal. No accessory muscle usage. Not tachypneic. No respiratory distress. She has no decreased breath sounds. She has no wheezes. She has no rhonchi. She has no rales. She exhibits no tenderness. Right breast exhibits no inverted nipple, no mass, no nipple discharge, no skin change and no tenderness. Left breast exhibits no inverted nipple, no mass, no nipple discharge, no skin change and no tenderness. Breasts are symmetrical.  Abdominal: Soft. Bowel sounds are normal. She exhibits no distension and no mass. There is no tenderness. There is no rebound and no guarding.  Musculoskeletal: Normal range of motion. She exhibits no edema and no tenderness.  Lymphadenopathy:    She has no cervical adenopathy.  Neurological: She is alert and oriented to person, place, and time. No cranial nerve deficit. She exhibits normal muscle tone. Coordination normal.  Skin: Skin is warm and dry. No rash noted.  She is not diaphoretic. No erythema. No pallor.  Psychiatric: She has a normal mood and affect. Her behavior is normal. Judgment and thought content normal.          Assessment & Plan:

## 2012-09-06 ENCOUNTER — Encounter: Payer: Self-pay | Admitting: *Deleted

## 2012-09-06 LAB — CBC WITH DIFFERENTIAL/PLATELET
Basophils Relative: 0.4 % (ref 0.0–3.0)
Eosinophils Absolute: 0.2 10*3/uL (ref 0.0–0.7)
Eosinophils Relative: 3.5 % (ref 0.0–5.0)
HCT: 34.9 % — ABNORMAL LOW (ref 36.0–46.0)
Lymphs Abs: 2.1 10*3/uL (ref 0.7–4.0)
MCHC: 34 g/dL (ref 30.0–36.0)
MCV: 88.3 fl (ref 78.0–100.0)
Monocytes Absolute: 0.6 10*3/uL (ref 0.1–1.0)
RBC: 3.95 Mil/uL (ref 3.87–5.11)
WBC: 4.5 10*3/uL (ref 4.5–10.5)

## 2012-09-06 LAB — COMPREHENSIVE METABOLIC PANEL
ALT: 20 U/L (ref 0–35)
CO2: 26 mEq/L (ref 19–32)
Calcium: 9.6 mg/dL (ref 8.4–10.5)
Chloride: 107 mEq/L (ref 96–112)
Creatinine, Ser: 1.6 mg/dL — ABNORMAL HIGH (ref 0.4–1.2)
GFR: 34.74 mL/min — ABNORMAL LOW (ref >60.00)
Glucose, Bld: 99 mg/dL (ref 70–99)
Total Bilirubin: 0.6 mg/dL (ref 0.3–1.2)
Total Protein: 7 g/dL (ref 6.0–8.3)

## 2012-09-06 LAB — LIPID PANEL
Cholesterol: 184 mg/dL (ref 0–200)
HDL: 44.7 mg/dL (ref >39.00)
VLDL: 26.6 mg/dL (ref 0.0–40.0)

## 2012-11-17 ENCOUNTER — Other Ambulatory Visit: Payer: Self-pay | Admitting: *Deleted

## 2012-11-17 DIAGNOSIS — E039 Hypothyroidism, unspecified: Secondary | ICD-10-CM

## 2012-11-17 MED ORDER — LEVOTHYROXINE SODIUM 75 MCG PO TABS: 75.0000 ug | ORAL_TABLET | Freq: Every day | ORAL | Status: DC

## 2012-11-17 NOTE — Telephone Encounter (Signed)
Eprescribed.

## 2012-11-22 ENCOUNTER — Other Ambulatory Visit: Payer: Self-pay | Admitting: *Deleted

## 2012-11-22 DIAGNOSIS — I10 Essential (primary) hypertension: Secondary | ICD-10-CM

## 2012-11-22 DIAGNOSIS — M549 Dorsalgia, unspecified: Secondary | ICD-10-CM

## 2012-11-23 MED ORDER — DULOXETINE HCL 60 MG PO CPEP: 60.0000 mg | ORAL_CAPSULE | Freq: Every day | ORAL | Status: DC

## 2012-11-23 MED ORDER — AMLODIPINE BESYLATE 2.5 MG PO TABS: 2.5000 mg | ORAL_TABLET | Freq: Every day | ORAL | Status: DC

## 2012-11-23 MED ORDER — QUINAPRIL HCL 20 MG PO TABS: 20.0000 mg | ORAL_TABLET | Freq: Every day | ORAL | Status: DC

## 2012-11-23 NOTE — Telephone Encounter (Signed)
Eprescribed.

## 2012-12-22 ENCOUNTER — Other Ambulatory Visit: Payer: Self-pay | Admitting: *Deleted

## 2012-12-22 DIAGNOSIS — K219 Gastro-esophageal reflux disease without esophagitis: Secondary | ICD-10-CM

## 2012-12-22 MED ORDER — OMEPRAZOLE 20 MG PO CPDR: 20.0000 mg | DELAYED_RELEASE_CAPSULE | Freq: Every day | ORAL | Status: DC

## 2013-03-03 ENCOUNTER — Encounter (INDEPENDENT_AMBULATORY_CARE_PROVIDER_SITE_OTHER): Payer: Self-pay

## 2013-03-03 ENCOUNTER — Encounter: Payer: Self-pay | Admitting: Internal Medicine

## 2013-03-03 ENCOUNTER — Ambulatory Visit (INDEPENDENT_AMBULATORY_CARE_PROVIDER_SITE_OTHER): Payer: Medicare Other | Admitting: Internal Medicine

## 2013-03-03 VITALS — BP 120/80 | HR 70 | Temp 98.7°F | Wt 157.0 lb

## 2013-03-03 DIAGNOSIS — E039 Hypothyroidism, unspecified: Secondary | ICD-10-CM

## 2013-03-03 DIAGNOSIS — I10 Essential (primary) hypertension: Secondary | ICD-10-CM

## 2013-03-03 DIAGNOSIS — N189 Chronic kidney disease, unspecified: Secondary | ICD-10-CM

## 2013-03-03 DIAGNOSIS — E785 Hyperlipidemia, unspecified: Secondary | ICD-10-CM

## 2013-03-03 DIAGNOSIS — N183 Chronic kidney disease, stage 3 unspecified: Secondary | ICD-10-CM

## 2013-03-03 DIAGNOSIS — Z23 Encounter for immunization: Secondary | ICD-10-CM

## 2013-03-03 LAB — COMPREHENSIVE METABOLIC PANEL
Alkaline Phosphatase: 98 U/L (ref 39–117)
BUN: 27 mg/dL — ABNORMAL HIGH (ref 6–23)
CO2: 25 mEq/L (ref 19–32)
Creatinine, Ser: 1.6 mg/dL — ABNORMAL HIGH (ref 0.4–1.2)
GFR: 34.69 mL/min — ABNORMAL LOW (ref >60.00)
Glucose, Bld: 100 mg/dL — ABNORMAL HIGH (ref 70–99)
Total Bilirubin: 0.7 mg/dL (ref 0.3–1.2)

## 2013-03-03 NOTE — Progress Notes (Signed)
Pre-visit discussion using our clinic review tool. No additional management support is needed unless otherwise documented below in the visit note.  

## 2013-03-04 DIAGNOSIS — E7800 Pure hypercholesterolemia, unspecified: Secondary | ICD-10-CM | POA: Insufficient documentation

## 2013-03-04 NOTE — Progress Notes (Signed)
Subjective:    Patient ID: Caitlin Lin, female    DOB: Aug 12, 1940, 72 y.o.   MRN: [id number]  HPI 72 year old female with history of hypertension, hyperlipidemia, hypothyroidism, and chronic kidney disease presents for followup. She is feeling well. No concerns today. She is compliant with her medications.  Outpatient Encounter Prescriptions as of 03/03/2013  Medication Sig  . amLODipine (NORVASC) 2.5 MG tablet Take 1 tablet (2.5 mg total) by mouth daily.  Marland Kitchen aspirin EC 81 MG tablet Take 81 mg by mouth daily.    Marland Kitchen atorvastatin (LIPITOR) 10 MG tablet Take 1 tablet (10 mg total) by mouth daily.  Marland Kitchen CRANBERRY PO Take 3,600 mg by mouth daily.    . DULoxetine (CYMBALTA) 60 MG capsule Take 1 capsule (60 mg total) by mouth daily.  . fexofenadine-pseudoephedrine (ALLEGRA-D 24) 180-240 MG per 24 hr tablet Take 1 tablet by mouth daily as needed.    Marland Kitchen glucosamine-chondroitin 500-400 MG tablet Take 1 tablet by mouth 3 (three) times daily.  Marland Kitchen levothyroxine (SYNTHROID, LEVOTHROID) 75 MCG tablet Take 1 tablet (75 mcg total) by mouth daily.  . Multiple Vitamins-Minerals (CENTRUM SILVER ULTRA WOMENS) TABS Take 1 tablet by mouth daily.    . Omega-3 Fatty Acids (FISH OIL) 1000 MG CAPS Take 1,000 mg by mouth daily.  Marland Kitchen omeprazole (PRILOSEC) 20 MG capsule Take 1 capsule (20 mg total) by mouth daily.  Marland Kitchen oxybutynin (DITROPAN-XL) 5 MG 24 hr tablet Take 5 mg by mouth at bedtime.   . quinapril (ACCUPRIL) 20 MG tablet Take 1 tablet (20 mg total) by mouth daily.  . Cholecalciferol (VITAMIN D) 1000 UNITS capsule Take 1,000 Units by mouth daily.    . Coenzyme Q10 (CO Q 10 PO) Take 2 tablets by mouth daily. W/Fish Oil Omega 3 1290-900 mg.   BP 120/80  Pulse 70  Temp(Src) 98.7 F (37.1 C) (Oral)  Wt 157 lb (71.215 kg)  SpO2 97%  Review of Systems  Constitutional: Negative for fever, chills, appetite change, fatigue and unexpected weight change.  HENT: Negative for congestion, ear pain, sinus pressure, sore  throat, trouble swallowing and voice change.   Eyes: Negative for visual disturbance.  Respiratory: Negative for cough, shortness of breath, wheezing and stridor.   Cardiovascular: Negative for chest pain, palpitations and leg swelling.  Gastrointestinal: Negative for nausea, vomiting, abdominal pain, diarrhea, constipation, blood in stool, abdominal distention and anal bleeding.  Genitourinary: Negative for dysuria and flank pain.  Musculoskeletal: Negative for arthralgias, gait problem, myalgias and neck pain.  Skin: Negative for color change and rash.  Neurological: Negative for dizziness and headaches.  Hematological: Negative for adenopathy. Does not bruise/bleed easily.  Psychiatric/Behavioral: Negative for suicidal ideas, sleep disturbance and dysphoric mood. The patient is not nervous/anxious.        Objective:   Physical Exam  Constitutional: She is oriented to person, place, and time. She appears well-developed and well-nourished. No distress.  HENT:  Head: Normocephalic and atraumatic.  Right Ear: External ear normal.  Left Ear: External ear normal.  Nose: Nose normal.  Mouth/Throat: Oropharynx is clear and moist. No oropharyngeal exudate.  Eyes: Conjunctivae are normal. Pupils are equal, round, and reactive to light. Right eye exhibits no discharge. Left eye exhibits no discharge. No scleral icterus.  Neck: Normal range of motion. Neck supple. No tracheal deviation present. No thyromegaly present.  Cardiovascular: Normal rate, regular rhythm, normal heart sounds and intact distal pulses.  Exam reveals no gallop and no friction rub.   No murmur heard.  Pulmonary/Chest: Effort normal and breath sounds normal. No accessory muscle usage. Not tachypneic. No respiratory distress. She has no decreased breath sounds. She has no wheezes. She has no rhonchi. She has no rales. She exhibits no tenderness.  Musculoskeletal: Normal range of motion. She exhibits no edema and no tenderness.    Lymphadenopathy:    She has no cervical adenopathy.  Neurological: She is alert and oriented to person, place, and time. No cranial nerve deficit. She exhibits normal muscle tone. Coordination normal.  Skin: Skin is warm and dry. No rash noted. She is not diaphoretic. No erythema. No pallor.  Psychiatric: She has a normal mood and affect. Her behavior is normal. Judgment and thought content normal.          Assessment & Plan:

## 2013-03-04 NOTE — Assessment & Plan Note (Signed)
BP Readings from Last 3 Encounters:  03/03/13 120/80  09/05/12 110/68  08/29/12 126/80   BP well controlled on current medication. Will check renal function with labs today. Follow up 6 months and prn.

## 2013-03-04 NOTE — Assessment & Plan Note (Signed)
Lab Results  Component Value Date   CREATININE 1.6* 03/03/2013   Renal function stable on labs today. Continue current medications. Follow up 6 months.

## 2013-03-04 NOTE — Assessment & Plan Note (Signed)
Symptomatically doing well. Plan repeat TSH in 6 months. Continue Levothyroxine.

## 2013-03-04 NOTE — Assessment & Plan Note (Signed)
Lipids well controlled on Atorvastatin. LFTS normal today. Continue Atorvastatin. Follow up 6 months.

## 2013-03-06 ENCOUNTER — Encounter: Payer: Self-pay | Admitting: *Deleted

## 2013-03-20 ENCOUNTER — Telehealth: Payer: Self-pay | Admitting: Emergency Medicine

## 2013-03-20 DIAGNOSIS — K219 Gastro-esophageal reflux disease without esophagitis: Secondary | ICD-10-CM

## 2013-03-20 DIAGNOSIS — I10 Essential (primary) hypertension: Secondary | ICD-10-CM

## 2013-03-20 DIAGNOSIS — E785 Hyperlipidemia, unspecified: Secondary | ICD-10-CM

## 2013-03-20 DIAGNOSIS — E039 Hypothyroidism, unspecified: Secondary | ICD-10-CM

## 2013-03-20 DIAGNOSIS — M549 Dorsalgia, unspecified: Secondary | ICD-10-CM

## 2013-03-20 MED ORDER — AMLODIPINE BESYLATE 2.5 MG PO TABS: 2.5000 mg | ORAL_TABLET | Freq: Every day | ORAL | Status: DC

## 2013-03-20 MED ORDER — QUINAPRIL HCL 20 MG PO TABS: 20.0000 mg | ORAL_TABLET | Freq: Every day | ORAL | Status: DC

## 2013-03-20 MED ORDER — LEVOTHYROXINE SODIUM 75 MCG PO TABS: 75.0000 ug | ORAL_TABLET | Freq: Every day | ORAL | Status: DC

## 2013-03-20 MED ORDER — OXYBUTYNIN CHLORIDE ER 5 MG PO TB24: 5.0000 mg | ORAL_TABLET | Freq: Every day | ORAL | Status: DC

## 2013-03-20 MED ORDER — OMEPRAZOLE 20 MG PO CPDR: 20.0000 mg | DELAYED_RELEASE_CAPSULE | Freq: Every day | ORAL | Status: DC

## 2013-03-20 MED ORDER — ATORVASTATIN CALCIUM 10 MG PO TABS
10.0000 mg | ORAL_TABLET | Freq: Every day | ORAL | Status: DC
Start: 2013-03-20 — End: 2013-08-21

## 2013-03-20 MED ORDER — DULOXETINE HCL 60 MG PO CPEP: 60.0000 mg | ORAL_CAPSULE | Freq: Every day | ORAL | Status: DC

## 2013-03-20 NOTE — Telephone Encounter (Signed)
All prescription medications has been sent to [facility] per patient request.

## 2013-03-20 NOTE — Telephone Encounter (Signed)
Pt lvm stating she has switched insurances and needs all medication sent to [facility] mail order. Please advise.

## 2013-05-22 ENCOUNTER — Ambulatory Visit: Payer: Self-pay | Admitting: Podiatry

## 2013-05-29 ENCOUNTER — Ambulatory Visit (INDEPENDENT_AMBULATORY_CARE_PROVIDER_SITE_OTHER): Payer: Commercial Managed Care - HMO

## 2013-05-29 ENCOUNTER — Encounter: Payer: Self-pay | Admitting: Podiatry

## 2013-05-29 ENCOUNTER — Ambulatory Visit (INDEPENDENT_AMBULATORY_CARE_PROVIDER_SITE_OTHER): Payer: Commercial Managed Care - HMO | Admitting: Podiatry

## 2013-05-29 VITALS — Ht 63.0 in | Wt 158.0 lb

## 2013-05-29 DIAGNOSIS — M2040 Other hammer toe(s) (acquired), unspecified foot: Secondary | ICD-10-CM

## 2013-05-29 DIAGNOSIS — M216X9 Other acquired deformities of unspecified foot: Secondary | ICD-10-CM

## 2013-05-29 DIAGNOSIS — M2042 Other hammer toe(s) (acquired), left foot: Secondary | ICD-10-CM

## 2013-05-29 NOTE — Progress Notes (Signed)
   Subjective:    Patient ID: Caitlin Lin, female    DOB: May 10, 1940, 73 y.o.   MRN: [id number]  HPI Comments: My toes are moving over , left foot , sore under the toes and toes seem to have knots on them. Hammertoe left #2 toe been giving me trouble 2-3 months   Toe Pain       Review of Systems  HENT:       Sinus problems  sneezing  Eyes: Positive for itching.  Gastrointestinal: Positive for constipation.  Endocrine: Positive for cold intolerance.  Genitourinary:       Frequency   Musculoskeletal: Positive for back pain.       Joint pain   Allergic/Immunologic: Positive for environmental allergies.  Hematological: Bruises/bleeds easily.       Objective:   Physical Exam: I reviewed her past medical history medications allergies surgeries social history as well as review of systems. Pulses are strongly palpable bilateral, + over 4 DPPT bilateral capillary fill time to digits one through 5 bilateral is immediate. Neurologic sensorium is intact bilateral. Deep tendon reflexes are intact and brisk bilateral. Muscle strength is 5 over 5 dorsiflexors plantar flexors inverters everters all intrinsic musculature is intact. Orthopedic evaluation demonstrates all joints distal to the ankle a full range of motion without crepitus with exception of the PIPJ second digit bilateral. She does have dorsal dislocation of the second metatarsophalangeal joint with medial deviation of the toe marked limitation on range of motion of the PIPJ second left greater than right. Radiographic evaluation does demonstrate elongated second metatarsal resulting in a plantar flexed metatarsal and a dorsiflexed toe. Cutaneous evaluation demonstrates supple well hydrated cutis no erythema edema cellulitis drainage or odor.        Assessment & Plan:  Assessment: Capsulitis from a plantar flexed elongated second metatarsal left hammertoe deformity and medial dislocation.  Plan: Discussed conservative therapy as  well as surgical therapy she will notify us when she is red for surgical.

## 2013-07-19 ENCOUNTER — Telehealth: Payer: Self-pay | Admitting: Internal Medicine

## 2013-07-19 NOTE — Telephone Encounter (Signed)
Came into office asking for referral to Dr. Holley Raring.  [employer] card scanned.  Has been seen by Dr. Holley Raring.  Appt 5/18 scheduled.

## 2013-07-21 NOTE — Telephone Encounter (Signed)
No referral needing to be done [facility] is part of [facility] network no authorization until after June 1,2015.

## 2013-07-25 ENCOUNTER — Telehealth: Payer: Self-pay | Admitting: Internal Medicine

## 2013-07-25 NOTE — Telephone Encounter (Signed)
Received fax from [facility] stating patient could see Dr. Aubery Lapping 4 visits Start Date 07/18/13 End Date 10/16/2013 CPT 44461 Dx: 172.6,V76.43, V10.82, V13.3

## 2013-08-03 NOTE — Telephone Encounter (Signed)
Received fax from [facility] back Auth# [id number] Treating Provider Dr. Anthonette Legato # of Visits 6 Start Date 07/31/13 End Date 10/29/13 CPT 99499 DX 585.3

## 2013-08-21 ENCOUNTER — Other Ambulatory Visit: Payer: Self-pay | Admitting: Internal Medicine

## 2013-08-21 NOTE — Telephone Encounter (Signed)
Appt 09/08/13

## 2013-09-08 ENCOUNTER — Ambulatory Visit (INDEPENDENT_AMBULATORY_CARE_PROVIDER_SITE_OTHER): Payer: Commercial Managed Care - HMO | Admitting: Internal Medicine

## 2013-09-08 ENCOUNTER — Encounter: Payer: Self-pay | Admitting: Internal Medicine

## 2013-09-08 VITALS — BP 120/74 | HR 81 | Temp 98.4°F | Ht 63.5 in | Wt 153.5 lb

## 2013-09-08 DIAGNOSIS — D509 Iron deficiency anemia, unspecified: Secondary | ICD-10-CM

## 2013-09-08 DIAGNOSIS — I10 Essential (primary) hypertension: Secondary | ICD-10-CM

## 2013-09-08 DIAGNOSIS — E785 Hyperlipidemia, unspecified: Secondary | ICD-10-CM

## 2013-09-08 DIAGNOSIS — E039 Hypothyroidism, unspecified: Secondary | ICD-10-CM

## 2013-09-08 DIAGNOSIS — N189 Chronic kidney disease, unspecified: Secondary | ICD-10-CM

## 2013-09-08 DIAGNOSIS — Z1239 Encounter for other screening for malignant neoplasm of breast: Secondary | ICD-10-CM

## 2013-09-08 DIAGNOSIS — Z0000 Encounter for general adult medical examination without abnormal findings: Secondary | ICD-10-CM

## 2013-09-08 LAB — COMPREHENSIVE METABOLIC PANEL
ALBUMIN: 4 g/dL (ref 3.5–5.2)
ALT: 17 U/L (ref 0–35)
AST: 23 U/L (ref 0–37)
Alkaline Phosphatase: 88 U/L (ref 39–117)
BILIRUBIN TOTAL: 0.5 mg/dL (ref 0.2–1.2)
BUN: 35 mg/dL — AB (ref 6–23)
CO2: 28 mEq/L (ref 19–32)
Calcium: 9.8 mg/dL (ref 8.4–10.5)
Chloride: 107 mEq/L (ref 96–112)
Creatinine, Ser: 1.9 mg/dL — ABNORMAL HIGH (ref 0.4–1.2)
GFR: 28.1 mL/min — ABNORMAL LOW (ref >60.00)
GLUCOSE: 95 mg/dL (ref 70–99)
POTASSIUM: 5 meq/L (ref 3.5–5.1)
Sodium: 139 mEq/L (ref 135–145)
Total Protein: 7.7 g/dL (ref 6.0–8.3)

## 2013-09-08 LAB — TSH: TSH: 0.59 u[IU]/mL (ref 0.35–4.50)

## 2013-09-08 LAB — LIPID PANEL
CHOLESTEROL: 184 mg/dL (ref 0–200)
HDL: 42.8 mg/dL (ref >39.00)
LDL Cholesterol: 106 mg/dL — ABNORMAL HIGH (ref 0–99)
NonHDL: 141.2
TRIGLYCERIDES: 177 mg/dL — AB (ref 0.0–149.0)
Total CHOL/HDL Ratio: 4
VLDL: 35.4 mg/dL (ref 0.0–40.0)

## 2013-09-08 LAB — MICROALBUMIN / CREATININE URINE RATIO
CREATININE, U: 139.7 mg/dL
MICROALB/CREAT RATIO: 0.4 mg/g (ref 0.0–30.0)
Microalb, Ur: 0.5 mg/dL (ref 0.0–1.9)

## 2013-09-08 LAB — CBC WITH DIFFERENTIAL/PLATELET
BASOS PCT: 0.5 % (ref 0.0–3.0)
Basophils Absolute: 0 10*3/uL (ref 0.0–0.1)
EOS ABS: 0.1 10*3/uL (ref 0.0–0.7)
EOS PCT: 2.3 % (ref 0.0–5.0)
HEMATOCRIT: 35.9 % — AB (ref 36.0–46.0)
Hemoglobin: 12.3 g/dL (ref 12.0–15.0)
LYMPHS ABS: 2 10*3/uL (ref 0.7–4.0)
Lymphocytes Relative: 42.8 % (ref 12.0–46.0)
MCHC: 34.2 g/dL (ref 30.0–36.0)
MCV: 86 fl (ref 78.0–100.0)
MONO ABS: 0.7 10*3/uL (ref 0.1–1.0)
Monocytes Relative: 15.3 % — ABNORMAL HIGH (ref 3.0–12.0)
Neutro Abs: 1.8 10*3/uL (ref 1.4–7.7)
Neutrophils Relative %: 39.1 % — ABNORMAL LOW (ref 43.0–77.0)
Platelets: 197 10*3/uL (ref 150.0–400.0)
RBC: 4.17 Mil/uL (ref 3.87–5.11)
RDW: 13.4 % (ref 11.5–15.5)
WBC: 4.6 10*3/uL (ref 4.0–10.5)

## 2013-09-08 MED ORDER — QUINAPRIL HCL 20 MG PO TABS: 20.0000 mg | ORAL_TABLET | Freq: Every day | ORAL | Status: DC

## 2013-09-08 MED ORDER — AMLODIPINE BESYLATE 2.5 MG PO TABS: 2.5000 mg | ORAL_TABLET | Freq: Every day | ORAL | Status: DC

## 2013-09-08 MED ORDER — LEVOTHYROXINE SODIUM 75 MCG PO TABS: 75.0000 ug | ORAL_TABLET | Freq: Every day | ORAL | Status: DC

## 2013-09-08 MED ORDER — ATORVASTATIN CALCIUM 10 MG PO TABS: 10.0000 mg | ORAL_TABLET | Freq: Every day | ORAL | Status: DC

## 2013-09-08 NOTE — Assessment & Plan Note (Signed)
Will check TSH with labs today. Continue Levothyroxine.

## 2013-09-08 NOTE — Progress Notes (Signed)
Subjective:    Patient ID: Caitlin Lin, female    DOB: 13-Jul-1940, 73 y.o.   MRN: [id number]  HPI The patient is here for annual Medicare wellness examination and management of other chronic and acute problems.   The risk factors are reflected in the social history.  The roster of all physicians providing medical care to patient - is listed in the Snapshot section of the chart.  Activities of daily living:  The patient is 100% independent in all ADLs: dressing, toileting, feeding as well as independent mobility. Lives in one story home with husband, dog and two cats.  Home safety : The patient has smoke and CO detectors in the home. They wear seatbelts.  There are no firearms at home. There is no violence in the home.   Fell last week, after tripping a gate. No noted injury, except some soreness in shoulders.  There is no risks for hepatitis, STDs or HIV. There is no history of blood transfusion. They have no travel history to infectious disease endemic areas of the world.  The patient has seen their dentist in the last six month [facility], Dr. Terance Hart).  They have seen their eye doctor in the last year (Dr. Chong Sicilian).  They admit to slight hearing difficulty with regard to whispered voices.  They have deferred audiologic testing in the last year.   They do not have excessive sun exposure. Discussed the need for sun protection: hats, long sleeves and use of sunscreen if there is significant sun exposure. (Derm - Dr. Aubery Lapping)  Diet: the importance of a healthy diet is discussed. They do have a relatively healthy diet. Increased fruits, vegs.  The benefits of regular aerobic exercise were discussed. Has not recently been to gym, but plans to start back.  Depression screen: there are no signs or vegative symptoms of depression- irritability, change in appetite, anhedonia, sadness/tearfullness.  Cognitive assessment: the patient manages all their financial and personal  affairs and is actively engaged. They could relate day,date,year and events.  The following portions of the patient's history were reviewed and updated as appropriate: allergies, current medications, past family history, past medical history,  past surgical history, past social history  and problem list.  Advances directives - HCPOA daughter, Foye Spurling.   Visual acuity was not assessed per patient preference since she has regular follow up with her ophthalmologist. Hearing and body mass index were assessed and reviewed.   During the course of the visit the patient was educated and counseled about appropriate screening and preventive services including : fall prevention , diabetes screening, nutrition counseling, colorectal cancer screening, and recommended immunizations.     Review of Systems  Constitutional: Negative for fever, chills, appetite change, fatigue and unexpected weight change.  Eyes: Negative for visual disturbance.  Respiratory: Negative for shortness of breath.   Cardiovascular: Negative for chest pain and leg swelling.  Gastrointestinal: Negative for abdominal pain.  Skin: Negative for color change and rash.  Hematological: Negative for adenopathy. Does not bruise/bleed easily.  Psychiatric/Behavioral: Negative for dysphoric mood. The patient is not nervous/anxious.        Objective:    BP 120/74  Pulse 81  Temp(Src) 98.4 F (36.9 C) (Oral)  Ht 5' 3.5" (1.613 m)  Wt 153 lb 8 oz (69.627 kg)  BMI 26.76 kg/m2  SpO2 96% Physical Exam  Constitutional: She is oriented to person, place, and time. She appears well-developed and well-nourished. No distress.  HENT:  Head: Normocephalic and atraumatic.  Right Ear: External ear normal.  Left Ear: External ear normal.  Nose: Nose normal.  Mouth/Throat: Oropharynx is clear and moist. No oropharyngeal exudate.  Eyes: Conjunctivae are normal. Pupils are equal, round, and reactive to light. Right eye exhibits no  discharge. Left eye exhibits no discharge. No scleral icterus.  Neck: Normal range of motion. Neck supple. No tracheal deviation present. No thyromegaly present.  Cardiovascular: Normal rate, regular rhythm, normal heart sounds and intact distal pulses.  Exam reveals no gallop and no friction rub.   No murmur heard. Pulmonary/Chest: Effort normal and breath sounds normal. No accessory muscle usage. Not tachypneic. No respiratory distress. She has no decreased breath sounds. She has no wheezes. She has no rales. She exhibits no tenderness. Right breast exhibits no inverted nipple, no mass, no nipple discharge, no skin change and no tenderness. Left breast exhibits no inverted nipple, no mass, no nipple discharge, no skin change and no tenderness. Breasts are symmetrical.  Abdominal: Soft. Bowel sounds are normal. She exhibits no distension and no mass. There is no tenderness. There is no rebound and no guarding.  Musculoskeletal: Normal range of motion. She exhibits no edema and no tenderness.  Lymphadenopathy:    She has no cervical adenopathy.  Neurological: She is alert and oriented to person, place, and time. No cranial nerve deficit. She exhibits normal muscle tone. Coordination normal.  Skin: Skin is warm and dry. No rash noted. She is not diaphoretic. No erythema. No pallor.  Psychiatric: She has a normal mood and affect. Her behavior is normal. Judgment and thought content normal.          Assessment & Plan:   Problem List Items Addressed This Visit     Unprioritized   Anemia, iron deficiency     Previous h/o anemia. Will check CBC with labs.    Relevant Orders      CBC w/Diff   Chronic kidney disease     Will check renal function with labs today. Continue follow up with Nephrology.    Hypertension      BP Readings from Last 3 Encounters:  09/08/13 120/74  03/03/13 120/80  09/05/12 110/68   BP well controlled with current medications. Will continue. Will check renal  function with labs today.    Relevant Medications      quinapril (ACCUPRIL) tablet      atorvastatin (LIPITOR) tablet      amLODIpine (NORVASC)  tablet   Other Relevant Orders      Microalbumin / creatinine urine ratio   Hypothyroidism     Will check TSH with labs today. Continue Levothyroxine.    Relevant Medications      levothyroxine (SYNTHROID, LEVOTHROID) tablet   Other Relevant Orders      TSH   Medicare annual wellness visit, subsequent - Primary     General medical exam including breast exam normal today. Pap and pelvic deferred given patient age and status post hysterectomy. Mammogram was completed today and results are pending. Colonoscopy is up-to-date. Immunizations are up to date except for Prevnar which will be due in 02/2014. Appropriate screening performed. Will check labs including CBC, CMP, lipid profile.      Relevant Orders      Comprehensive metabolic panel      Lipid panel      DG Bone Density   Other and unspecified hyperlipidemia     Will check lipids with labs today. Continue Atorvastatin.    Relevant Medications  quinapril (ACCUPRIL) tablet      atorvastatin (LIPITOR) tablet      amLODIpine (NORVASC)  tablet   Screening for breast cancer   Relevant Orders      MM Digital Screening       Return in about 6 months (around 03/10/2014) for Recheck.

## 2013-09-08 NOTE — Assessment & Plan Note (Signed)
Will check renal function with labs today. Continue follow up with Nephrology.

## 2013-09-08 NOTE — Patient Instructions (Signed)
It was nice to see you today.  You are doing well.  Continue healthy diet and set a goal of exercising 40 minutes three times per week.  We will plan to see you back in 6 months or sooner as needed.  We will give you the Prevar vaccine at your follow up visit.

## 2013-09-08 NOTE — Assessment & Plan Note (Signed)
BP Readings from Last 3 Encounters:  09/08/13 120/74  03/03/13 120/80  09/05/12 110/68   BP well controlled with current medications. Will continue. Will check renal function with labs today.

## 2013-09-08 NOTE — Assessment & Plan Note (Signed)
Will check lipids with labs today. Continue Atorvastatin.

## 2013-09-08 NOTE — Progress Notes (Signed)
Pre visit review using our clinic review tool, if applicable. No additional management support is needed unless otherwise documented below in the visit note. 

## 2013-09-08 NOTE — Assessment & Plan Note (Signed)
General medical exam including breast exam normal today. Pap and pelvic deferred given patient age and status post hysterectomy. Mammogram was completed today and results are pending. Colonoscopy is up-to-date. Immunizations are up to date except for Prevnar which will be due in 02/2014. Appropriate screening performed. Will check labs including CBC, CMP, lipid profile.

## 2013-09-08 NOTE — Assessment & Plan Note (Signed)
Previous h/o anemia. Will check CBC with labs.

## 2013-09-11 ENCOUNTER — Telehealth: Payer: Self-pay | Admitting: Internal Medicine

## 2013-09-11 NOTE — Telephone Encounter (Signed)
Relevant patient education assigned to patient using Emmi. ° °

## 2013-09-12 ENCOUNTER — Encounter: Payer: Self-pay | Admitting: *Deleted

## 2013-10-11 ENCOUNTER — Ambulatory Visit: Payer: Self-pay | Admitting: Internal Medicine

## 2013-10-11 ENCOUNTER — Encounter: Payer: Self-pay | Admitting: *Deleted

## 2013-10-11 LAB — HM MAMMOGRAPHY: HM Mammogram: NEGATIVE

## 2013-10-11 LAB — HM DEXA SCAN

## 2013-10-12 ENCOUNTER — Encounter: Payer: Self-pay | Admitting: *Deleted

## 2013-10-15 ENCOUNTER — Telehealth: Payer: Self-pay | Admitting: Internal Medicine

## 2013-10-15 NOTE — Telephone Encounter (Signed)
Bone Density testing showed T-score of -1.5. Treatment is advised. We should set up an appointment to discuss treatment options.

## 2013-10-16 NOTE — Telephone Encounter (Signed)
Can we see if aug 17th would work for this f/u on bone density test, thanks

## 2013-10-17 NOTE — Telephone Encounter (Signed)
Patient scheduled on 8.17.15

## 2013-10-30 ENCOUNTER — Ambulatory Visit (INDEPENDENT_AMBULATORY_CARE_PROVIDER_SITE_OTHER): Payer: Commercial Managed Care - HMO | Admitting: Internal Medicine

## 2013-10-30 ENCOUNTER — Encounter: Payer: Self-pay | Admitting: Internal Medicine

## 2013-10-30 VITALS — BP 106/60 | HR 85 | Temp 98.6°F | Ht 63.5 in | Wt 158.0 lb

## 2013-10-30 DIAGNOSIS — N189 Chronic kidney disease, unspecified: Secondary | ICD-10-CM

## 2013-10-30 DIAGNOSIS — R209 Unspecified disturbances of skin sensation: Secondary | ICD-10-CM

## 2013-10-30 DIAGNOSIS — M899 Disorder of bone, unspecified: Secondary | ICD-10-CM

## 2013-10-30 DIAGNOSIS — M8580 Other specified disorders of bone density and structure, unspecified site: Secondary | ICD-10-CM

## 2013-10-30 DIAGNOSIS — R200 Anesthesia of skin: Secondary | ICD-10-CM

## 2013-10-30 DIAGNOSIS — M949 Disorder of cartilage, unspecified: Secondary | ICD-10-CM

## 2013-10-30 NOTE — Patient Instructions (Signed)
We will schedule MRI brain and carotid ultrasound.  Follow up in 4 weeks.

## 2013-10-30 NOTE — Assessment & Plan Note (Signed)
Reviewed recent bone density testing. Discussed treatment of osteopenia including vit D supplementation, which she recently started, adequate dietary intake of Ca, and weight bearing exercise. We also discussed medications including bisphosphonates and prolia. We will plan to supplement Vit D for now 1000units daily and recheck Bone Density in 2017.

## 2013-10-30 NOTE — Progress Notes (Signed)
Subjective:    Patient ID: Caitlin Lin, female    DOB: [birth year], 73 y.o.   MRN: [id number]  HPI 73YO female presents to follow up recent bone density testing. T-score -1.5. She has never taken medication to help with bone density. Her nephrologist recently started her on a Vit D supplement. She has not had a fracture.  Numbness - about 3-4 weeks ago, had right arm and right leg numbness which lasted for a few minutes. It recurred several times that week, but then has never recurred again. No weakness.  No slurred speech. No problems with word finding. No chest pain, palpitations.   Review of Systems  Constitutional: Negative for fever, chills, appetite change, fatigue and unexpected weight change.  Eyes: Negative for visual disturbance.  Respiratory: Negative for shortness of breath.   Cardiovascular: Negative for chest pain and leg swelling.  Gastrointestinal: Negative for nausea, vomiting, abdominal pain, diarrhea and constipation.  Skin: Negative for color change and rash.  Neurological: Positive for numbness. Negative for dizziness, seizures, syncope, facial asymmetry, speech difficulty, weakness and headaches.  Hematological: Negative for adenopathy. Does not bruise/bleed easily.  Psychiatric/Behavioral: Negative for sleep disturbance and dysphoric mood. The patient is not nervous/anxious.        Objective:    BP 106/60  Pulse 85  Temp(Src) 98.6 F (37 C) (Oral)  Ht 5' 3.5" (1.613 m)  Wt 158 lb (71.668 kg)  BMI 27.55 kg/m2  SpO2 95% Physical Exam  Constitutional: She is oriented to person, place, and time. She appears well-developed and well-nourished. No distress.  HENT:  Head: Normocephalic and atraumatic.  Right Ear: External ear normal.  Left Ear: External ear normal.  Nose: Nose normal.  Mouth/Throat: Oropharynx is clear and moist. No oropharyngeal exudate.  Eyes: Conjunctivae are normal. Pupils are equal, round, and reactive to light. Right eye exhibits no  discharge. Left eye exhibits no discharge. No scleral icterus.  Neck: Normal range of motion. Neck supple. Carotid bruit is not present. No tracheal deviation present. No mass and no thyromegaly present.  Cardiovascular: Normal rate, regular rhythm, normal heart sounds and intact distal pulses.  Exam reveals no gallop and no friction rub.   No murmur heard. Pulmonary/Chest: Effort normal and breath sounds normal. No accessory muscle usage. Not tachypneic. No respiratory distress. She has no decreased breath sounds. She has no wheezes. She has no rhonchi. She has no rales. She exhibits no tenderness.  Musculoskeletal: Normal range of motion. She exhibits no edema and no tenderness.  Lymphadenopathy:    She has no cervical adenopathy.  Neurological: She is alert and oriented to person, place, and time. She displays no atrophy and no tremor. No cranial nerve deficit or sensory deficit. She exhibits normal muscle tone. She displays no seizure activity. Coordination and gait normal.  Skin: Skin is warm and dry. No rash noted. She is not diaphoretic. No erythema. No pallor.  Psychiatric: She has a normal mood and affect. Her behavior is normal. Judgment and thought content normal.          Assessment & Plan:   Problem List Items Addressed This Visit     Unprioritized   Chronic kidney disease     Recently seen by nephrology and started on Vit D. Will request notes.    Osteopenia - Primary     Reviewed recent bone density testing. Discussed treatment of osteopenia including vit D supplementation, which she recently started, adequate dietary intake of Ca, and weight bearing exercise. We also  discussed medications including bisphosphonates and prolia. We will plan to supplement Vit D for now 1000units daily and recheck Bone Density in 2017.    Right arm numbness     Right arm and leg numbness concerning for TIA. Exam normal today. Will get carotid doppler and MRI brain for further evaluation.  Follow up in 2-4 weeks and as needed. She will call immediately and seek care if any recurrent symptoms of numbness.    Relevant Orders      US Carotid Duplex Bilateral      MR Brain Wo Contrast       Return in about 4 weeks (around 11/27/2013) for Recheck.

## 2013-10-30 NOTE — Assessment & Plan Note (Signed)
Right arm and leg numbness concerning for TIA. Exam normal today. Will get carotid doppler and MRI brain for further evaluation. Follow up in 2-4 weeks and as needed. She will call immediately and seek care if any recurrent symptoms of numbness.

## 2013-10-30 NOTE — Progress Notes (Signed)
Pre visit review using our clinic review tool, if applicable. No additional management support is needed unless otherwise documented below in the visit note. 

## 2013-10-30 NOTE — Assessment & Plan Note (Signed)
Recently seen by nephrology and started on Vit D. Will request notes.

## 2013-11-21 ENCOUNTER — Ambulatory Visit: Payer: Self-pay | Admitting: Internal Medicine

## 2013-11-21 ENCOUNTER — Telehealth: Payer: Self-pay | Admitting: *Deleted

## 2013-11-21 DIAGNOSIS — S065XAA Traumatic subdural hemorrhage with loss of consciousness status unknown, initial encounter: Secondary | ICD-10-CM

## 2013-11-21 DIAGNOSIS — S065X9A Traumatic subdural hemorrhage with loss of consciousness of unspecified duration, initial encounter: Secondary | ICD-10-CM

## 2013-11-21 NOTE — Telephone Encounter (Signed)
Caitlin Lin, Please schedule this referral appt

## 2013-11-21 NOTE — Telephone Encounter (Signed)
MR Brain results - Small chronic subdural hematoma on L with mild mass effect. No shift of midline structures.  Chronic microvascular ischemia, no acute infarction.

## 2013-11-21 NOTE — Telephone Encounter (Signed)
Spoke with pt about imaging and she will follow up at [facility] tomorrow.

## 2013-11-21 NOTE — Telephone Encounter (Signed)
Please let her know about results. Please have her stop her Aspirin. I will place referral to Neurology. I would like this referral to be completed this week.

## 2013-11-21 NOTE — Telephone Encounter (Signed)
[facility]  Dr Melrose Nakayama   September 9 @  7:30am

## 2013-11-22 DIAGNOSIS — S065X9A Traumatic subdural hemorrhage with loss of consciousness of unspecified duration, initial encounter: Secondary | ICD-10-CM | POA: Insufficient documentation

## 2013-11-22 DIAGNOSIS — S065XAA Traumatic subdural hemorrhage with loss of consciousness status unknown, initial encounter: Secondary | ICD-10-CM | POA: Insufficient documentation

## 2013-11-23 ENCOUNTER — Telehealth: Payer: Self-pay | Admitting: Internal Medicine

## 2013-11-23 NOTE — Telephone Encounter (Signed)
Carotid dopplers were normal.

## 2013-11-27 NOTE — Telephone Encounter (Signed)
Notified pt. 

## 2013-12-05 ENCOUNTER — Encounter: Payer: Self-pay | Admitting: Internal Medicine

## 2013-12-05 ENCOUNTER — Ambulatory Visit (INDEPENDENT_AMBULATORY_CARE_PROVIDER_SITE_OTHER): Payer: Commercial Managed Care - HMO | Admitting: Internal Medicine

## 2013-12-05 VITALS — BP 122/76 | HR 91 | Temp 98.6°F | Ht 63.5 in | Wt 157.5 lb

## 2013-12-05 DIAGNOSIS — Z23 Encounter for immunization: Secondary | ICD-10-CM

## 2013-12-05 DIAGNOSIS — Z5189 Encounter for other specified aftercare: Secondary | ICD-10-CM

## 2013-12-05 DIAGNOSIS — K219 Gastro-esophageal reflux disease without esophagitis: Secondary | ICD-10-CM | POA: Insufficient documentation

## 2013-12-05 DIAGNOSIS — S065X0D Traumatic subdural hemorrhage without loss of consciousness, subsequent encounter: Secondary | ICD-10-CM

## 2013-12-05 MED ORDER — OMEPRAZOLE 20 MG PO CPDR: 20.0000 mg | DELAYED_RELEASE_CAPSULE | Freq: Every day | ORAL | Status: DC

## 2013-12-05 NOTE — Assessment & Plan Note (Signed)
Will restart Omeprazole. If no improvement over next 1-2 weeks, we discussed testing for H. Pylori. Follow up prn.

## 2013-12-05 NOTE — Progress Notes (Signed)
Pre visit review using our clinic review tool, if applicable. No additional management support is needed unless otherwise documented below in the visit note. 

## 2013-12-05 NOTE — Assessment & Plan Note (Signed)
Recent fall with SDH. Reviewed notes from Neurology. Plan for MRI brain in 3 months to re-evaluate. Holding Aspirin.

## 2013-12-05 NOTE — Patient Instructions (Signed)
Start back on Omeprazole to help with reflux symptoms.  Call if symptoms aren't improving over the next 1-2 weeks.  Follow up in 3 months.

## 2013-12-05 NOTE — Progress Notes (Signed)
Subjective:    Patient ID: Caitlin Lin, female    DOB: 1940/04/21, 73 y.o.   MRN: [id number]  HPI 73YO female presents for follow up.  SDH - Recently seen by neurology. Agreed with holding Aspirin. Plan for repeat MRI in 3 months. No headache or numbess. No weakness noted. No visual changes.  GERD - notes worsening acid reflux. Stopped taking Omeprazole because of concern about this affecting her kidney function. Reflux described as burning made worse with intake of fried and spicy foods.  Review of Systems  Constitutional: Negative for fever, chills, appetite change, fatigue and unexpected weight change.  Eyes: Negative for visual disturbance.  Respiratory: Negative for shortness of breath.   Cardiovascular: Negative for chest pain and leg swelling.  Gastrointestinal: Positive for abdominal pain. Negative for nausea, vomiting, diarrhea, constipation, blood in stool, abdominal distention and anal bleeding.  Skin: Negative for color change and rash.  Neurological: Negative for dizziness, tremors, facial asymmetry, speech difficulty, weakness, numbness and headaches.  Hematological: Negative for adenopathy. Does not bruise/bleed easily.  Psychiatric/Behavioral: Negative for dysphoric mood. The patient is not nervous/anxious.        Objective:    BP 122/76  Pulse 91  Temp(Src) 98.6 F (37 C) (Oral)  Ht 5' 3.5" (1.613 m)  Wt 157 lb 8 oz (71.442 kg)  BMI 27.46 kg/m2  SpO2 93% Physical Exam  Constitutional: She is oriented to person, place, and time. She appears well-developed and well-nourished. No distress.  HENT:  Head: Normocephalic and atraumatic.  Right Ear: External ear normal.  Left Ear: External ear normal.  Nose: Nose normal.  Mouth/Throat: Oropharynx is clear and moist. No oropharyngeal exudate.  Eyes: Conjunctivae are normal. Pupils are equal, round, and reactive to light. Right eye exhibits no discharge. Left eye exhibits no discharge. No scleral icterus.  Neck:  Normal range of motion. Neck supple. No tracheal deviation present. No thyromegaly present.  Cardiovascular: Normal rate, regular rhythm, normal heart sounds and intact distal pulses.  Exam reveals no gallop and no friction rub.   No murmur heard. Pulmonary/Chest: Effort normal and breath sounds normal. No accessory muscle usage. Not tachypneic. No respiratory distress. She has no decreased breath sounds. She has no wheezes. She has no rhonchi. She has no rales. She exhibits no tenderness.  Musculoskeletal: Normal range of motion. She exhibits no edema and no tenderness.  Lymphadenopathy:    She has no cervical adenopathy.  Neurological: She is alert and oriented to person, place, and time. No cranial nerve deficit. She exhibits normal muscle tone. Coordination normal.  Skin: Skin is warm and dry. No rash noted. She is not diaphoretic. No erythema. No pallor.  Psychiatric: She has a normal mood and affect. Her behavior is normal. Judgment and thought content normal.          Assessment & Plan:   Problem List Items Addressed This Visit     Unprioritized   GERD (gastroesophageal reflux disease)     Will restart Omeprazole. If no improvement over next 1-2 weeks, we discussed testing for H. Pylori. Follow up prn.    Relevant Medications      omeprazole (PRILOSEC) capsule   Intracranial subdural hematoma - Primary     Recent fall with SDH. Reviewed notes from Neurology. Plan for MRI brain in 3 months to re-evaluate. Holding Aspirin.     Other Visit Diagnoses   Need for prophylactic vaccination and inoculation against influenza  Return in about 3 months (around 03/06/2014) for Recheck.

## 2013-12-17 HISTORY — PX: PERINEOPLASTY: SHX2218

## 2013-12-27 ENCOUNTER — Encounter: Payer: Self-pay | Admitting: Internal Medicine

## 2014-01-23 ENCOUNTER — Other Ambulatory Visit: Payer: Self-pay | Admitting: Internal Medicine

## 2014-03-12 ENCOUNTER — Encounter: Payer: Self-pay | Admitting: Internal Medicine

## 2014-03-12 ENCOUNTER — Ambulatory Visit (INDEPENDENT_AMBULATORY_CARE_PROVIDER_SITE_OTHER): Payer: Commercial Managed Care - HMO | Admitting: Internal Medicine

## 2014-03-12 VITALS — BP 123/79 | HR 70 | Temp 98.4°F | Ht 63.5 in | Wt 157.2 lb

## 2014-03-12 DIAGNOSIS — I10 Essential (primary) hypertension: Secondary | ICD-10-CM

## 2014-03-12 DIAGNOSIS — E039 Hypothyroidism, unspecified: Secondary | ICD-10-CM

## 2014-03-12 DIAGNOSIS — Z23 Encounter for immunization: Secondary | ICD-10-CM

## 2014-03-12 LAB — COMPREHENSIVE METABOLIC PANEL
ALT: 15 U/L (ref 0–35)
AST: 20 U/L (ref 0–37)
Albumin: 3.8 g/dL (ref 3.5–5.2)
Alkaline Phosphatase: 80 U/L (ref 39–117)
BILIRUBIN TOTAL: 0.6 mg/dL (ref 0.2–1.2)
BUN: 32 mg/dL — AB (ref 6–23)
CHLORIDE: 107 meq/L (ref 96–112)
CO2: 30 mEq/L (ref 19–32)
Calcium: 9.3 mg/dL (ref 8.4–10.5)
Creatinine, Ser: 1.5 mg/dL — ABNORMAL HIGH (ref 0.4–1.2)
GFR: 36.47 mL/min — AB (ref >60.00)
GLUCOSE: 94 mg/dL (ref 70–99)
Potassium: 5.8 mEq/L — ABNORMAL HIGH (ref 3.5–5.1)
Sodium: 141 mEq/L (ref 135–145)
TOTAL PROTEIN: 6.8 g/dL (ref 6.0–8.3)

## 2014-03-12 LAB — TSH: TSH: 0.82 u[IU]/mL (ref 0.35–4.50)

## 2014-03-12 NOTE — Addendum Note (Signed)
Addended by: Vernetta Honey on: 03/12/2014 09:42 AM   Modules accepted: Orders

## 2014-03-12 NOTE — Assessment & Plan Note (Signed)
Will check TSH with labs today. Continue Levothyroxine.

## 2014-03-12 NOTE — Progress Notes (Signed)
Pre visit review using our clinic review tool, if applicable. No additional management support is needed unless otherwise documented below in the visit note. 

## 2014-03-12 NOTE — Progress Notes (Signed)
   Subjective:    Patient ID: Caitlin Lin, female    DOB: [birth year], 73 y.o.   MRN: [id number]  HPI 73YO female presents for follow up.  Feeling well. No concerns today. Compliant with medication.   Past medical, surgical, family and social history per today's encounter.  Review of Systems  Constitutional: Negative for fever, chills, appetite change, fatigue and unexpected weight change.  Eyes: Negative for visual disturbance.  Respiratory: Negative for shortness of breath.   Cardiovascular: Negative for chest pain and leg swelling.  Gastrointestinal: Negative for nausea, vomiting, abdominal pain, diarrhea and constipation.  Skin: Negative for color change and rash.  Hematological: Negative for adenopathy. Does not bruise/bleed easily.  Psychiatric/Behavioral: Negative for dysphoric mood. The patient is not nervous/anxious.        Objective:    BP 123/79 mmHg  Pulse 70  Temp(Src) 98.4 F (36.9 C) (Oral)  Ht 5' 3.5" (1.613 m)  Wt 157 lb 4 oz (71.328 kg)  BMI 27.42 kg/m2  SpO2 98% Physical Exam  Constitutional: She is oriented to person, place, and time. She appears well-developed and well-nourished. No distress.  HENT:  Head: Normocephalic and atraumatic.  Right Ear: External ear normal.  Left Ear: External ear normal.  Nose: Nose normal.  Mouth/Throat: Oropharynx is clear and moist. No oropharyngeal exudate.  Eyes: Conjunctivae are normal. Pupils are equal, round, and reactive to light. Right eye exhibits no discharge. Left eye exhibits no discharge. No scleral icterus.  Neck: Normal range of motion. Neck supple. No tracheal deviation present. No thyromegaly present.  Cardiovascular: Normal rate, regular rhythm, normal heart sounds and intact distal pulses.  Exam reveals no gallop and no friction rub.   No murmur heard. Pulmonary/Chest: Effort normal and breath sounds normal. No accessory muscle usage. No tachypnea. No respiratory distress. She has no decreased breath  sounds. She has no wheezes. She has no rhonchi. She has no rales. She exhibits no tenderness.  Musculoskeletal: Normal range of motion. She exhibits no edema or tenderness.  Lymphadenopathy:    She has no cervical adenopathy.  Neurological: She is alert and oriented to person, place, and time. No cranial nerve deficit. She exhibits normal muscle tone. Coordination normal.  Skin: Skin is warm and dry. No rash noted. She is not diaphoretic. No erythema. No pallor.  Psychiatric: She has a normal mood and affect. Her behavior is normal. Judgment and thought content normal.          Assessment & Plan:   Problem List Items Addressed This Visit      Unprioritized   Hypertension - Primary    BP Readings from Last 3 Encounters:  03/12/14 123/79  12/05/13 122/76  10/30/13 106/60   BP well controlled on current medication. Renal function with labs today.    Relevant Orders      Comprehensive metabolic panel   Hypothyroidism    Will check TSH with labs today. Continue Levothyroxine.    Relevant Orders      TSH       Return in about 6 months (around 09/11/2014) for Wellness Visit.

## 2014-03-12 NOTE — Assessment & Plan Note (Signed)
BP Readings from Last 3 Encounters:  03/12/14 123/79  12/05/13 122/76  10/30/13 106/60   BP well controlled on current medication. Renal function with labs today.

## 2014-03-12 NOTE — Patient Instructions (Signed)
Labs today.  Follow up in 6 months. 

## 2014-03-13 ENCOUNTER — Other Ambulatory Visit (INDEPENDENT_AMBULATORY_CARE_PROVIDER_SITE_OTHER): Payer: Commercial Managed Care - HMO

## 2014-03-13 ENCOUNTER — Telehealth: Payer: Self-pay | Admitting: *Deleted

## 2014-03-13 ENCOUNTER — Other Ambulatory Visit: Payer: Self-pay | Admitting: Internal Medicine

## 2014-03-13 DIAGNOSIS — E875 Hyperkalemia: Secondary | ICD-10-CM

## 2014-03-13 LAB — BASIC METABOLIC PANEL
BUN: 31 mg/dL — ABNORMAL HIGH (ref 6–23)
CO2: 29 mEq/L (ref 19–32)
Calcium: 9.5 mg/dL (ref 8.4–10.5)
Chloride: 108 mEq/L (ref 96–112)
Creatinine, Ser: 1.6 mg/dL — ABNORMAL HIGH (ref 0.4–1.2)
GFR: 32.65 mL/min — AB (ref >60.00)
Glucose, Bld: 98 mg/dL (ref 70–99)
Potassium: 5.4 mEq/L — ABNORMAL HIGH (ref 3.5–5.1)
SODIUM: 142 meq/L (ref 135–145)

## 2014-03-13 NOTE — Telephone Encounter (Signed)
What labs and dX?  

## 2014-03-13 NOTE — Telephone Encounter (Signed)
BMP for hyperkalemia

## 2014-03-14 ENCOUNTER — Other Ambulatory Visit: Payer: Self-pay | Admitting: *Deleted

## 2014-03-14 DIAGNOSIS — E875 Hyperkalemia: Secondary | ICD-10-CM

## 2014-03-21 ENCOUNTER — Other Ambulatory Visit (INDEPENDENT_AMBULATORY_CARE_PROVIDER_SITE_OTHER): Payer: Commercial Managed Care - HMO

## 2014-03-21 ENCOUNTER — Ambulatory Visit: Payer: Commercial Managed Care - HMO

## 2014-03-21 DIAGNOSIS — E875 Hyperkalemia: Secondary | ICD-10-CM

## 2014-03-21 DIAGNOSIS — Z0130 Encounter for examination of blood pressure without abnormal findings: Secondary | ICD-10-CM

## 2014-03-21 LAB — BASIC METABOLIC PANEL
BUN: 27 mg/dL — ABNORMAL HIGH (ref 6–23)
CO2: 26 mEq/L (ref 19–32)
Calcium: 9.5 mg/dL (ref 8.4–10.5)
Chloride: 106 mEq/L (ref 96–112)
Creatinine, Ser: 1.7 mg/dL — ABNORMAL HIGH (ref 0.4–1.2)
GFR: 31.32 mL/min — AB (ref >60.00)
Glucose, Bld: 105 mg/dL — ABNORMAL HIGH (ref 70–99)
POTASSIUM: 4.6 meq/L (ref 3.5–5.1)
SODIUM: 138 meq/L (ref 135–145)

## 2014-03-21 NOTE — Progress Notes (Signed)
Blood pressure taken at 132/70. Pulse 88.

## 2014-03-21 NOTE — Addendum Note (Signed)
Addended by: Dia Crawford on: 03/21/2014 11:04 AM   Modules accepted: Level of Service

## 2014-08-07 ENCOUNTER — Other Ambulatory Visit: Payer: Self-pay | Admitting: Internal Medicine

## 2014-08-21 ENCOUNTER — Other Ambulatory Visit: Payer: Self-pay | Admitting: Internal Medicine

## 2014-09-24 ENCOUNTER — Ambulatory Visit (INDEPENDENT_AMBULATORY_CARE_PROVIDER_SITE_OTHER): Payer: Commercial Managed Care - HMO

## 2014-09-24 VITALS — BP 128/78 | HR 76 | Temp 97.9°F | Resp 12 | Ht 63.5 in | Wt 160.6 lb

## 2014-09-24 DIAGNOSIS — Z0000 Encounter for general adult medical examination without abnormal findings: Secondary | ICD-10-CM

## 2014-09-24 NOTE — Progress Notes (Signed)
Subjective:   Caitlin Lin is a 74 y.o. female who presents for Medicare Annual (Subsequent) preventive examination.  Review of Systems:  No ROS.  Medicare Wellness Visit.  Cardiac Risk Factors Include:   Advanced age (older than 82 for men, 37 for women)     Objective:      Vitals: BP 128/78 mmHg  Pulse 76  Temp(Src) 97.9 F (36.6 C)  Resp 12  Ht 5' 3.5" (1.613 m)  Wt 160 lb 9.6 oz (72.848 kg)  BMI 28.00 kg/m2  SpO2 96%  Tobacco History  Smoking status  . Never Smoker   Smokeless tobacco  . Never Used     Counseling given: Not Answered   Past Medical History  Diagnosis Date  . Chronic kidney disease     Followed by Dr. Holley Raring  . Spinal stenosis in cervical region     Dr. Sharlet Salina  . Shingles 2013  . Melanoma of lower leg 2013   Past Surgical History  Procedure Laterality Date  . Abdominal hysterectomy  1974  . Lung biopsy  2007    Benign per pt's report  . Breast biopsy  1963    Benign per pt's report   Family History  Problem Relation Age of Onset  . Arthritis Mother   . Liver disease Mother   . Cirrhosis Mother   . Heart disease Father   . Heart attack Father   . Breast cancer Other   . Depression Brother    History  Sexual Activity  . Sexual Activity: Yes    Outpatient Encounter Prescriptions as of 09/24/2014  Medication Sig  . amLODipine (NORVASC) 2.5 MG tablet TAKE 1 TABLET EVERY DAY  . aspirin 81 MG tablet Take 81 mg by mouth daily.  Marland Kitchen atorvastatin (LIPITOR) 10 MG tablet TAKE 1 TABLET EVERY DAY  AT  6PM  . cholecalciferol (VITAMIN D) 1000 UNITS tablet Take 1,000 Units by mouth 2 (two) times daily.  Marland Kitchen CRANBERRY PO Take 3,600 mg by mouth daily.    . DULoxetine (CYMBALTA) 60 MG capsule TAKE 1 CAPSULE EVERY DAY  . fexofenadine-pseudoephedrine (ALLEGRA-D 24) 180-240 MG per 24 hr tablet Take 1 tablet by mouth daily as needed.    Marland Kitchen glucosamine-chondroitin 500-400 MG tablet Take 1 tablet by mouth 3 (three) times daily.  Marland Kitchen levothyroxine  (SYNTHROID, LEVOTHROID) 75 MCG tablet TAKE 1 TABLET EVERY DAY BEFORE BREAKFAST  . Multiple Vitamins-Minerals (CENTRUM SILVER ULTRA WOMENS) TABS Take 1 tablet by mouth daily.    . Omega-3 Fatty Acids (FISH OIL) 1000 MG CAPS Take 1,000 mg by mouth daily.  Marland Kitchen omeprazole (PRILOSEC) 20 MG capsule Take 1 capsule (20 mg total) by mouth daily.   No facility-administered encounter medications on file as of 09/24/2014.    Activities of Daily Living In your present state of health, do you have any difficulty performing the following activities: 09/24/2014  Hearing? N  Vision? N  Difficulty concentrating or making decisions? N  Walking or climbing stairs? N  Dressing or bathing? N  Doing errands, shopping? N  Preparing Food and eating ? N  Using the Toilet? N  In the past six months, have you accidently leaked urine? Y  Do you have problems with loss of bowel control? N  Managing your Medications? N  Managing your Finances? N  Housekeeping or managing your Housekeeping? N    Patient Care Team: Jackolyn Confer, MD as PCP - General (Internal Medicine) Anthonette Legato, MD (Internal Medicine)    Assessment:  This is a routine wellness examination for [employer].  Exercise Activities and Dietary recommendations Current Exercise Habits:: Home exercise routine, Type of exercise: walking, Time (Minutes): 30, Frequency (Times/Week): 3, Weekly Exercise (Minutes/Week): 90, Intensity: Mild  Goals    . Increase physical activity     Patient desires to Increase exercising.  Chair exercises at home.  30 min a day for 3 to 5 days a week.      Fall Risk Fall Risk  09/24/2014 09/08/2013 09/05/2012 03/07/2012  Falls in the past year? Yes Yes No No  Number falls in past yr: 1 1 - -  Injury with Fall? Yes No - -  Risk for fall due to : Other (Comment) Impaired balance/gait - -  Risk for fall due to (comments): Tripped. - - -  Follow up Falls evaluation completed;Falls prevention discussed - - -    Depression Screen PHQ 2/9 Scores 09/24/2014 09/08/2013 09/05/2012 03/07/2012  PHQ - 2 Score 0 0 0 0     Cognitive Testing MMSE - Mini Mental State Exam 09/24/2014 09/24/2014  Orientation to time 5 5  Orientation to Place 5 -  Registration 3 3  Attention/ Calculation 5 -  Recall 3 3  Language- name 2 objects 2 -  Language- repeat 1 1  Language- follow 3 step command 3 -  Language- read & follow direction 1 1  Write a sentence 1 -  Copy design 1 1  Total score 30 -    Immunization History  Administered Date(s) Administered  . Influenza Split 12/15/2010, 03/07/2012  . Influenza,inj,Quad PF,36+ Mos 12/05/2013  . Influenza-Unspecified 03/03/2013  . Pneumococcal Conjugate-13 03/12/2014  . Pneumococcal Polysaccharide-23 12/09/2005, 03/03/2013  . Tdap 03/07/2012  . Zoster 12/09/2009   Screening Tests Health Maintenance  Topic Date Due  . MAMMOGRAM  10/12/2014  . INFLUENZA VACCINE  10/15/2014  . COLONOSCOPY  06/23/2021  . TETANUS/TDAP  03/07/2022  . DEXA SCAN  Completed  . ZOSTAVAX  Completed  . PNA vac Low Risk Adult  Completed      Plan:    During the course of the visit the patient was educated and counseled about the following appropriate screening and preventive services:   Vaccines to include Pneumoccal, Influenza, Hepatitis B, Td, Zostavax, HCV  Electrocardiogram  Cardiovascular Disease  Colorectal cancer screening  Bone density screening  Diabetes screening  Glaucoma screening  Mammography/PAP-referral placed  Nutrition counseling-    The goal of the wellness visit is to assist the patient how to close the gaps in care and create a preventative care plan for the patient.  Personalized Education was given regarding: Falls prevention, healthy diet, physical exercise.  Pt determined a personalized goal; see patient goals.  Taking meds without issues; no barriers identified.  No Risk for hepatitis or high risk social behavior identified via hepatitis  screen.   Safety issues reviewed: Smoke detectors in the home.  They wear seatbelts. Firearms in a locked case in the home.  No violence in the home.  The patient was oriented x 3; appropriate in dress and manner and no objective failures at ADL's or IADL's.   Depression screen negative.   Functional status reviewed; no losses in function x 1 year.  Bowel and bladder issues assessed.  Follow up with PCP in regards to discussion of a second opinion with chronic kidney issues.  End of life planning was discussed; aging in home or other; plans to bring a copy of HCPOA and Living Will.  Patient Instructions (the written plan) was given to the patient. Follow up appoinment made with PCP for R ear/jaw discomfort.  Varney Biles, LPN  10/23/1749

## 2014-09-24 NOTE — Patient Instructions (Addendum)
Caitlin Lin,  Thank you for taking time to come for your Medicare Wellness Visit.  I appreciate your ongoing commitment to your health goals. Please review the following plan we discussed and let me know if I can assist you in the future.  -Mammogram referral placed.  -Follow up appointment with PCP for Right Ear Discomfort.  -Incorporate chair exercises at home, increasing from 3 to 5 days according to level of comfort.  -Bring a of copy of HCPOA/Living Will to be scanned to record.  -Eat a healthy diet.  -Continue taking medications as prescribed.   Fat and Cholesterol Control Diet Fat and cholesterol levels in your blood and organs are influenced by your diet. High levels of fat and cholesterol may lead to diseases of the heart, small and large blood vessels, gallbladder, liver, and pancreas. CONTROLLING FAT AND CHOLESTEROL WITH DIET Although exercise and lifestyle factors are important, your diet is key. That is because certain foods are known to raise cholesterol and others to lower it. The goal is to balance foods for their effect on cholesterol and more importantly, to replace saturated and trans fat with other types of fat, such as monounsaturated fat, polyunsaturated fat, and omega-3 fatty acids. On average, a person should consume no more than 15 to 17 g of saturated fat daily. Saturated and trans fats are considered "bad" fats, and they will raise LDL cholesterol. Saturated fats are primarily found in animal products such as meats, butter, and cream. However, that does not mean you need to give up all your favorite foods. Today, there are good tasting, low-fat, low-cholesterol substitutes for most of the things you like to eat. Choose low-fat or nonfat alternatives. Choose round or loin cuts of red meat. These types of cuts are lowest in fat and cholesterol. Chicken (without the skin), fish, veal, and ground [place] breast are great choices. Eliminate fatty meats, such as hot dogs and  salami. Even shellfish have little or no saturated fat. Have a 3 oz (85 g) portion when you eat lean meat, poultry, or fish. Trans fats are also called "partially hydrogenated oils." They are oils that have been scientifically manipulated so that they are solid at room temperature resulting in a longer shelf life and improved taste and texture of foods in which they are added. Trans fats are found in stick margarine, some tub margarines, cookies, crackers, and baked goods.  When baking and cooking, oils are a great substitute for butter. The monounsaturated oils are especially beneficial since it is believed they lower LDL and raise HDL. The oils you should avoid entirely are saturated tropical oils, such as coconut and palm.  Remember to eat a lot from food groups that are naturally free of saturated and trans fat, including fish, fruit, vegetables, beans, grains (barley, rice, couscous, bulgur wheat), and pasta (without cream sauces).  IDENTIFYING FOODS THAT LOWER FAT AND CHOLESTEROL  Soluble fiber may lower your cholesterol. This type of fiber is found in fruits such as apples, vegetables such as broccoli, potatoes, and carrots, legumes such as beans, peas, and lentils, and grains such as barley. Foods fortified with plant sterols (phytosterol) may also lower cholesterol. You should eat at least 2 g per day of these foods for a cholesterol lowering effect.  Read package labels to identify low-saturated fats, trans fat free, and low-fat foods at the supermarket. Select cheeses that have only 2 to 3 g saturated fat per ounce. Use a heart-healthy tub margarine that is free of trans  fats or partially hydrogenated oil. When buying baked goods (cookies, crackers), avoid partially hydrogenated oils. Breads and muffins should be made from whole grains (whole-wheat or whole oat flour, instead of "flour" or "enriched flour"). Buy non-creamy canned soups with reduced salt and no added fats.  FOOD PREPARATION  TECHNIQUES  Never deep-fry. If you must fry, either stir-fry, which uses very little fat, or use non-stick cooking sprays. When possible, broil, bake, or roast meats, and steam vegetables. Instead of putting butter or margarine on vegetables, use lemon and herbs, applesauce, and cinnamon (for squash and sweet potatoes). Use nonfat yogurt, salsa, and low-fat dressings for salads.  LOW-SATURATED FAT / LOW-FAT FOOD SUBSTITUTES Meats / Saturated Fat (g)  Avoid: Steak, marbled (3 oz/85 g) / 11 g  Choose: Steak, lean (3 oz/85 g) / 4 g  Avoid: Hamburger (3 oz/85 g) / 7 g  Choose: Hamburger, lean (3 oz/85 g) / 5 g  Avoid: Ham (3 oz/85 g) / 6 g  Choose: Ham, lean cut (3 oz/85 g) / 2.4 g  Avoid: Chicken, with skin, dark meat (3 oz/85 g) / 4 g  Choose: Chicken, skin removed, dark meat (3 oz/85 g) / 2 g  Avoid: Chicken, with skin, light meat (3 oz/85 g) / 2.5 g  Choose: Chicken, skin removed, light meat (3 oz/85 g) / 1 g Dairy / Saturated Fat (g)  Avoid: Whole milk (1 cup) / 5 g  Choose: Low-fat milk, 2% (1 cup) / 3 g  Choose: Low-fat milk, 1% (1 cup) / 1.5 g  Choose: Skim milk (1 cup) / 0.3 g  Avoid: Hard cheese (1 oz/28 g) / 6 g  Choose: Skim milk cheese (1 oz/28 g) / 2 to 3 g  Avoid: Cottage cheese, 4% fat (1 cup) / 6.5 g  Choose: Low-fat cottage cheese, 1% fat (1 cup) / 1.5 g  Avoid: Ice cream (1 cup) / 9 g  Choose: Sherbet (1 cup) / 2.5 g  Choose: Nonfat frozen yogurt (1 cup) / 0.3 g  Choose: Frozen fruit bar / trace  Avoid: Whipped cream (1 tbs) / 3.5 g  Choose: Nondairy whipped topping (1 tbs) / 1 g Condiments / Saturated Fat (g)  Avoid: Mayonnaise (1 tbs) / 2 g  Choose: Low-fat mayonnaise (1 tbs) / 1 g  Avoid: Butter (1 tbs) / 7 g  Choose: Extra light margarine (1 tbs) / 1 g  Avoid: Coconut oil (1 tbs) / 11.8 g  Choose: Olive oil (1 tbs) / 1.8 g  Choose: Corn oil (1 tbs) / 1.7 g  Choose: Safflower oil (1 tbs) / 1.2 g  Choose: Sunflower oil (1 tbs) /  1.4 g  Choose: Soybean oil (1 tbs) / 2.4 g  Choose: Canola oil (1 tbs) / 1 g Document Released: 03/02/2005 Document Revised: 06/27/2012 Document Reviewed: 05/31/2013 [employer] Patient Information 2015 [place], [place]. This information is not intended to replace advice given to you by your health care provider. Make sure you discuss any questions you have with your health care provider.  Fall Prevention and Home Safety Falls cause injuries and can affect all age groups. It is possible to prevent falls.  HOW TO PREVENT FALLS  Wear shoes with rubber soles that do not have an opening for your toes.  Keep the inside and outside of your house well lit.  Use night lights throughout your home.  Remove clutter from floors.  Clean up floor spills.  Remove throw rugs or fasten them to the  floor with carpet tape.  Do not place electrical cords across pathways.  Put grab bars by your tub, shower, and toilet. Do not use towel bars as grab bars.  Put handrails on both sides of the stairway. Fix loose handrails.  Do not climb on stools or stepladders, if possible.  Do not wax your floors.  Repair uneven or unsafe sidewalks, walkways, or stairs.  Keep items you use a lot within reach.  Be aware of pets.  Keep emergency numbers next to the telephone.  Put smoke detectors in your home and near bedrooms. Ask your doctor what other things you can do to prevent falls. Document Released: 12/27/2008 Document Revised: 09/01/2011 Document Reviewed: 06/02/2011 [facility] Patient Information 2015 [place], [place]. This information is not intended to replace advice given to you by your health care provider. Make sure you discuss any questions you have with your health care provider.  Mammography Mammography is an X-ray of the breasts to look for changes that are not normal. The X-ray image is called a mammogram. This procedure can screen for breast cancer, can detect cancer early, and can diagnose  cancer.  LET YOUR CAREGIVER KNOW ABOUT:  Breast implants.  Previous breast disease, biopsy, or surgery.  If you are breastfeeding.  Medicines taken, including vitamins, herbs, eyedrops, over-the-counter medicines, and creams.  Use of steroids (by mouth or creams).  Possibility of pregnancy, if this applies. RISKS AND COMPLICATIONS  Exposure to radiation, but at very low levels.  The results may be misinterpreted.  The results may not be accurate.  Mammography may lead to further tests.  Mammography may not catch certain cancers. BEFORE THE PROCEDURE  Schedule your test about 7 days after your menstrual period. This is when your breasts are the least tender and have signs of hormone changes.  If you have had a mammography done at a different facility in the past, get the mammogram X-rays or have them sent to your current exam facility in order to compare them.  Wash your breasts and under your arms the day of the test.  Do not wear deodorants, perfumes, or powders anywhere on your body.  Wear clothes that you can change in and out of easily. PROCEDURE Relax as much as possible during the test. Any discomfort during the test will be very brief. The test should take less than 30 minutes. The following will happen:  You will undress from the waist up and put on a gown.  You will stand in front of the X-ray machine.  Each breast will be placed between 2 plastic or glass plates. The plates will compress your breast for a few seconds.  X-rays will be taken from different angles of the breast. AFTER THE PROCEDURE  The mammogram will be examined.  Depending on the quality of the images, you may need to repeat certain parts of the test.  Ask when your test results will be ready. Make sure you get your test results.  You may resume normal activities. Document Released: 02/28/2000 Document Revised: 05/25/2011 Document Reviewed: 12/21/2010 [facility] Patient Information  2015 [place], [place]. This information is not intended to replace advice given to you by your health care provider. Make sure you discuss any questions you have with your health care provider.

## 2014-09-24 NOTE — Progress Notes (Signed)
Annual Wellness Visit as completed by [facility] was reviewed in full.  

## 2014-09-25 ENCOUNTER — Encounter: Payer: Self-pay | Admitting: Internal Medicine

## 2014-09-25 ENCOUNTER — Ambulatory Visit (INDEPENDENT_AMBULATORY_CARE_PROVIDER_SITE_OTHER): Payer: Commercial Managed Care - HMO | Admitting: Internal Medicine

## 2014-09-25 VITALS — BP 109/70 | HR 83 | Temp 98.2°F | Ht 63.5 in | Wt 161.4 lb

## 2014-09-25 DIAGNOSIS — I10 Essential (primary) hypertension: Secondary | ICD-10-CM

## 2014-09-25 DIAGNOSIS — M2662 Arthralgia of temporomandibular joint: Secondary | ICD-10-CM

## 2014-09-25 DIAGNOSIS — N189 Chronic kidney disease, unspecified: Secondary | ICD-10-CM

## 2014-09-25 DIAGNOSIS — M26629 Arthralgia of temporomandibular joint, unspecified side: Secondary | ICD-10-CM | POA: Insufficient documentation

## 2014-09-25 LAB — BASIC METABOLIC PANEL
BUN: 31 mg/dL — ABNORMAL HIGH (ref 6–23)
CO2: 28 mEq/L (ref 19–32)
Calcium: 9.7 mg/dL (ref 8.4–10.5)
Chloride: 103 mEq/L (ref 96–112)
Creatinine, Ser: 1.71 mg/dL — ABNORMAL HIGH (ref 0.40–1.20)
GFR: 31.06 mL/min — ABNORMAL LOW (ref >60.00)
Glucose, Bld: 93 mg/dL (ref 70–99)
Potassium: 4.8 mEq/L (ref 3.5–5.1)
SODIUM: 138 meq/L (ref 135–145)

## 2014-09-25 NOTE — Assessment & Plan Note (Signed)
CKD with GFR near 30. Reviewed recent notes from nephrology. Will repeat renal function today given recent addition of Losartan.

## 2014-09-25 NOTE — Patient Instructions (Addendum)
Labs today.  Please set up a follow up visit with your dentist.   Temporomandibular Problems  Temporomandibular joint (TMJ) dysfunction means there are problems with the joint between your jaw and your skull. This is a joint lined by cartilage like other joints in your body but also has a small disc in the joint which keeps the bones from rubbing on each other. These joints are like other joints and can get inflamed (sore) from arthritis and other problems. When this joint gets sore, it can cause headaches and pain in the jaw and the face. CAUSES  Usually the arthritic types of problems are caused by soreness in the joint. Soreness in the joint can also be caused by overuse. This may come from grinding your teeth. It may also come from mis-alignment in the joint. DIAGNOSIS Diagnosis of this condition can often be made by history and exam. Sometimes your caregiver may need X-rays or an MRI scan to determine the exact cause. It may be necessary to see your dentist to determine if your teeth and jaws are lined up correctly. TREATMENT  Most of the time this problem is not serious; however, sometimes it can persist (become chronic). When this happens medications that will cut down on inflammation (soreness) help. Sometimes a shot of cortisone into the joint will be helpful. If your teeth are not aligned it may help for your dentist to make a splint for your mouth that can help this problem. If no physical problems can be found, the problem may come from tension. If tension is found to be the cause, biofeedback or relaxation techniques may be helpful. HOME CARE INSTRUCTIONS   Later in the day, applications of ice packs may be helpful. Ice can be used in a plastic bag with a towel around it to prevent frostbite to skin. This may be used about every 2 hours for 20 to 30 minutes, as needed while awake, or as directed by your caregiver.  Only take over-the-counter or prescription medicines for pain,  discomfort, or fever as directed by your caregiver.  If physical therapy was prescribed, follow your caregiver's directions.  Wear mouth appliances as directed if they were given. Document Released: 11/25/2000 Document Revised: 05/25/2011 Document Reviewed: 03/04/2008 [facility] Patient Information 2015 [place], [place]. This information is not intended to replace advice given to you by your health care provider. Make sure you discuss any questions you have with your health care provider.

## 2014-09-25 NOTE — Assessment & Plan Note (Signed)
BP Readings from Last 3 Encounters:  09/25/14 109/70  09/24/14 128/78  03/12/14 123/79   BP well controlled. Will check renal function given recent addition of Losartan.

## 2014-09-25 NOTE — Progress Notes (Signed)
Pre visit review using our clinic review tool, if applicable. No additional management support is needed unless otherwise documented below in the visit note. 

## 2014-09-25 NOTE — Assessment & Plan Note (Signed)
Discussed TMJ arthralgia and potential treatments. She will follow up with her dentist. Continue prn Tylenol.

## 2014-09-25 NOTE — Progress Notes (Signed)
Subjective:    Patient ID: Caitlin Lin, female    DOB: [birth year], 74 y.o.   MRN: [id number]  HPI  74YO female presents for acute visit.  Right ear and jaw pain - Aching. Comes and goes over last few months. Worsened with opening jaw. Not taking anything for pain. Sees Dr. Roselyn Reef at [facility]  CKD - Followed by Dr. Holley Raring. Concerned about renal function  Started on Losartan '25mg'$  daily by Dr. Holley Raring at last visit. No side effects noted. Questions if she needs to get a second opinion regarding kidney function. Worried about risk of needing HD.    Past medical, surgical, family and social history per today's encounter.  Review of Systems  Constitutional: Negative for fever, chills, appetite change, fatigue and unexpected weight change.  HENT: Negative for congestion, ear pain, postnasal drip, rhinorrhea, sinus pressure, sore throat and trouble swallowing.   Eyes: Negative for visual disturbance.  Respiratory: Negative for shortness of breath.   Cardiovascular: Negative for chest pain and leg swelling.  Gastrointestinal: Negative for nausea, vomiting, abdominal pain, diarrhea and constipation.  Musculoskeletal: Positive for arthralgias.  Skin: Negative for color change and rash.  Hematological: Negative for adenopathy. Does not bruise/bleed easily.  Psychiatric/Behavioral: Negative for dysphoric mood. The patient is not nervous/anxious.        Objective:    BP 109/70 mmHg  Pulse 83  Temp(Src) 98.2 F (36.8 C) (Oral)  Ht 5' 3.5" (1.613 m)  Wt 161 lb 6 oz (73.199 kg)  BMI 28.13 kg/m2  SpO2 95% Physical Exam  Constitutional: She is oriented to person, place, and time. She appears well-developed and well-nourished. No distress.  HENT:  Head: Normocephalic and atraumatic.    Right Ear: External ear normal.  Left Ear: External ear normal.  Nose: Nose normal.  Mouth/Throat: Oropharynx is clear and moist. No oropharyngeal exudate.  Eyes: Conjunctivae are normal. Pupils  are equal, round, and reactive to light. Right eye exhibits no discharge. Left eye exhibits no discharge. No scleral icterus.  Neck: Normal range of motion. Neck supple. No tracheal deviation present. No thyromegaly present.  Cardiovascular: Normal rate, regular rhythm, normal heart sounds and intact distal pulses.  Exam reveals no gallop and no friction rub.   No murmur heard. Pulmonary/Chest: Effort normal and breath sounds normal. No respiratory distress. She has no wheezes. She has no rales. She exhibits no tenderness.  Musculoskeletal: Normal range of motion. She exhibits no edema or tenderness.  Lymphadenopathy:    She has no cervical adenopathy.  Neurological: She is alert and oriented to person, place, and time. No cranial nerve deficit. She exhibits normal muscle tone. Coordination normal.  Skin: Skin is warm and dry. No rash noted. She is not diaphoretic. No erythema. No pallor.  Psychiatric: She has a normal mood and affect. Her behavior is normal. Judgment and thought content normal.          Assessment & Plan:   Problem List Items Addressed This Visit      Unprioritized   Chronic kidney disease    CKD with GFR near 30. Reviewed recent notes from nephrology. Will repeat renal function today given recent addition of Losartan.      Relevant Orders   Basic Metabolic Panel (BMET)   Hypertension    BP Readings from Last 3 Encounters:  09/25/14 109/70  09/24/14 128/78  03/12/14 123/79   BP well controlled. Will check renal function given recent addition of Losartan.      Relevant Medications  losartan (COZAAR) 25 MG tablet   TMJ arthralgia - Primary    Discussed TMJ arthralgia and potential treatments. She will follow up with her dentist. Continue prn Tylenol.          Return in about 3 months (around 12/26/2014) for Recheck.

## 2014-10-15 ENCOUNTER — Ambulatory Visit
Admission: RE | Admit: 2014-10-15 | Discharge: 2014-10-15 | Disposition: A | Discharge status: Home / Self Care | Payer: Commercial Managed Care - HMO | Source: Ambulatory Visit | Attending: Internal Medicine | Admitting: Internal Medicine

## 2014-10-15 ENCOUNTER — Other Ambulatory Visit: Payer: Self-pay | Admitting: Internal Medicine

## 2014-10-15 DIAGNOSIS — Z0000 Encounter for general adult medical examination without abnormal findings: Secondary | ICD-10-CM | POA: Diagnosis present

## 2014-10-15 DIAGNOSIS — Z1231 Encounter for screening mammogram for malignant neoplasm of breast: Secondary | ICD-10-CM | POA: Diagnosis not present

## 2014-10-15 LAB — HM MAMMOGRAPHY

## 2014-12-27 ENCOUNTER — Ambulatory Visit: Payer: Commercial Managed Care - HMO | Admitting: Internal Medicine

## 2015-01-01 ENCOUNTER — Other Ambulatory Visit: Payer: Self-pay | Admitting: Internal Medicine

## 2015-02-05 ENCOUNTER — Telehealth: Payer: Self-pay | Admitting: *Deleted

## 2015-02-05 ENCOUNTER — Other Ambulatory Visit: Payer: Self-pay

## 2015-02-05 MED ORDER — DULOXETINE HCL 60 MG PO CPEP: 60.0000 mg | ORAL_CAPSULE | Freq: Every day | ORAL | Status: DC

## 2015-02-05 NOTE — Telephone Encounter (Signed)
Patient requested a medication refill to be called in to [facility] on [address], Duloxetine, and Omeprazole.

## 2015-02-05 NOTE — Telephone Encounter (Signed)
Sent to the pharmacy

## 2015-03-07 ENCOUNTER — Ambulatory Visit: Payer: Self-pay | Admitting: Internal Medicine

## 2015-03-27 ENCOUNTER — Encounter: Payer: Self-pay | Admitting: Internal Medicine

## 2015-03-27 ENCOUNTER — Telehealth: Payer: Self-pay | Admitting: *Deleted

## 2015-03-27 ENCOUNTER — Ambulatory Visit (INDEPENDENT_AMBULATORY_CARE_PROVIDER_SITE_OTHER): Payer: PPO | Admitting: Internal Medicine

## 2015-03-27 VITALS — BP 116/70 | HR 77 | Temp 98.7°F | Ht 64.0 in | Wt 157.2 lb

## 2015-03-27 DIAGNOSIS — I10 Essential (primary) hypertension: Secondary | ICD-10-CM | POA: Diagnosis not present

## 2015-03-27 DIAGNOSIS — K130 Diseases of lips: Secondary | ICD-10-CM | POA: Diagnosis not present

## 2015-03-27 DIAGNOSIS — E039 Hypothyroidism, unspecified: Secondary | ICD-10-CM | POA: Diagnosis not present

## 2015-03-27 DIAGNOSIS — N189 Chronic kidney disease, unspecified: Secondary | ICD-10-CM

## 2015-03-27 LAB — LIPID PANEL
CHOL/HDL RATIO: 5
Cholesterol: 214 mg/dL — ABNORMAL HIGH (ref 0–200)
HDL: 43.2 mg/dL (ref >39.00)
LDL Cholesterol: 137 mg/dL — ABNORMAL HIGH (ref 0–99)
NonHDL: 170.34
TRIGLYCERIDES: 169 mg/dL — AB (ref 0.0–149.0)
VLDL: 33.8 mg/dL (ref 0.0–40.0)

## 2015-03-27 LAB — COMPREHENSIVE METABOLIC PANEL
ALT: 17 U/L (ref 0–35)
AST: 21 U/L (ref 0–37)
Albumin: 4 g/dL (ref 3.5–5.2)
Alkaline Phosphatase: 89 U/L (ref 39–117)
BILIRUBIN TOTAL: 0.6 mg/dL (ref 0.2–1.2)
BUN: 30 mg/dL — ABNORMAL HIGH (ref 6–23)
CALCIUM: 9.8 mg/dL (ref 8.4–10.5)
CO2: 31 mEq/L (ref 19–32)
Chloride: 104 mEq/L (ref 96–112)
Creatinine, Ser: 1.61 mg/dL — ABNORMAL HIGH (ref 0.40–1.20)
GFR: 33.26 mL/min — ABNORMAL LOW (ref >60.00)
GLUCOSE: 102 mg/dL — AB (ref 70–99)
Potassium: 5.6 mEq/L — ABNORMAL HIGH (ref 3.5–5.1)
Sodium: 140 mEq/L (ref 135–145)
Total Protein: 6.8 g/dL (ref 6.0–8.3)

## 2015-03-27 LAB — MICROALBUMIN / CREATININE URINE RATIO
Creatinine,U: 151.2 mg/dL
MICROALB/CREAT RATIO: 1.4 mg/g (ref 0.0–30.0)
Microalb, Ur: 2.1 mg/dL — ABNORMAL HIGH (ref 0.0–1.9)

## 2015-03-27 LAB — TSH: TSH: 3.04 u[IU]/mL (ref 0.35–4.50)

## 2015-03-27 MED ORDER — AMLODIPINE BESYLATE 2.5 MG PO TABS: 2.5000 mg | ORAL_TABLET | Freq: Every day | ORAL | Status: DC

## 2015-03-27 MED ORDER — NYSTATIN-TRIAMCINOLONE 100000-0.1 UNIT/GM-% EX OINT: 1.0000 "application " | TOPICAL_OINTMENT | Freq: Two times a day (BID) | CUTANEOUS | Status: DC

## 2015-03-27 MED ORDER — OMEPRAZOLE 20 MG PO CPDR: 20.0000 mg | DELAYED_RELEASE_CAPSULE | Freq: Every day | ORAL | Status: DC

## 2015-03-27 MED ORDER — LEVOTHYROXINE SODIUM 75 MCG PO TABS: ORAL_TABLET | ORAL | Status: DC

## 2015-03-27 MED ORDER — ATORVASTATIN CALCIUM 10 MG PO TABS: ORAL_TABLET | ORAL | Status: DC

## 2015-03-27 MED ORDER — DULOXETINE HCL 60 MG PO CPEP: 60.0000 mg | ORAL_CAPSULE | Freq: Every day | ORAL | Status: DC

## 2015-03-27 NOTE — Telephone Encounter (Signed)
Patient stated that she was given a Rx for nystatin-triamcinolone ointment, however this medication is to expensive. The pharmacist suggested this medication be written separately verses mixed.  Patient uses [facility] on [address]

## 2015-03-27 NOTE — Telephone Encounter (Signed)
Per Dr Gilford Rile, Faythe Ghee to call pharmacy and give a verbal approval to separate the medications

## 2015-03-27 NOTE — Patient Instructions (Addendum)
Labs today.  Start pea-sized amount of Triamcinolone-Nystatin cream to corners of mouth. Call if no improvement over next week.

## 2015-03-27 NOTE — Assessment & Plan Note (Signed)
Will start topical Nystatin-Triamcinolone. Follow up if symptoms not improving.

## 2015-03-27 NOTE — Telephone Encounter (Signed)
Please advise patient needs a different prescription prescribed.

## 2015-03-27 NOTE — Assessment & Plan Note (Signed)
Followed by Dr. Holley Raring. Will repeat labs today.

## 2015-03-27 NOTE — Assessment & Plan Note (Signed)
BP Readings from Last 3 Encounters:  03/27/15 116/70  09/25/14 109/70  09/24/14 128/78   BP well controlled. Renal function with labs. Continue current medication.

## 2015-03-27 NOTE — Assessment & Plan Note (Signed)
Will check TSH with labs. Continue Levothyroxine. 

## 2015-03-27 NOTE — Progress Notes (Signed)
Pre visit review using our clinic review tool, if applicable. No additional management support is needed unless otherwise documented below in the visit note. 

## 2015-03-27 NOTE — Progress Notes (Signed)
Subjective:    Patient ID: Caitlin Lin, female    DOB: 09-24-1940, 75 y.o.   MRN: [id number]  HPI  75YO female presents for follow up.  Feeling well.  Recently seen by Dr. Holley Raring. No changes made to medication. GFR near 37.  Rash - Noted around corners of mouth last few months. Tried antibiotic cream with no improvement. Rash has been red with some bumps. Slightly itchy.  Wt Readings from Last 3 Encounters:  03/27/15 157 lb 4 oz (71.328 kg)  09/25/14 161 lb 6 oz (73.199 kg)  09/24/14 160 lb 9.6 oz (72.848 kg)   BP Readings from Last 3 Encounters:  03/27/15 116/70  09/25/14 109/70  09/24/14 128/78    Past Medical History  Diagnosis Date  . Chronic kidney disease     Followed by Dr. Holley Raring  . Spinal stenosis in cervical region     Dr. Sharlet Salina  . Shingles 2013  . Melanoma of lower leg ([place]) 2013   Family History  Problem Relation Age of Onset  . Arthritis Mother   . Liver disease Mother   . Cirrhosis Mother   . Heart disease Father   . Heart attack Father   . Breast cancer Other   . Depression Brother   . Breast cancer Cousin     maternal cousins   Past Surgical History  Procedure Laterality Date  . Abdominal hysterectomy  1974  . Lung biopsy  2007    Benign per pt's report  . Breast biopsy  1963    Benign per pt's report   Social History   Social History  . Marital Status: Married    Spouse Name: N/A  . Number of Children: 2  . Years of Education: N/A   Occupational History  . Retired    Social History Main Topics  . Smoking status: Never Smoker   . Smokeless tobacco: Never Used  . Alcohol Use: No  . Drug Use: No  . Sexual Activity: Yes   Other Topics Concern  . None   Social History Narrative   Lives in [place] with husband. Has 2 children boy and girl, 4 grandchildren. Retired Presenter, broadcasting.       Daily Caffeine Use:  NO   Regular Exercise -  Not at this time due to back pain, would like to resume soon    Review of Systems    Constitutional: Negative for fever, chills, appetite change, fatigue and unexpected weight change.  Eyes: Negative for visual disturbance.  Respiratory: Negative for shortness of breath.   Cardiovascular: Negative for chest pain and leg swelling.  Gastrointestinal: Negative for nausea, vomiting, abdominal pain, diarrhea and constipation.  Musculoskeletal: Negative for myalgias and arthralgias.  Skin: Positive for color change and rash.  Hematological: Negative for adenopathy. Does not bruise/bleed easily.  Psychiatric/Behavioral: Negative for dysphoric mood. The patient is not nervous/anxious.        Objective:    BP 116/70 mmHg  Pulse 77  Temp(Src) 98.7 F (37.1 C) (Oral)  Ht '5\' 4"'$  (1.626 m)  Wt 157 lb 4 oz (71.328 kg)  BMI 26.98 kg/m2  SpO2 97% Physical Exam  Constitutional: She is oriented to person, place, and time. She appears well-developed and well-nourished. No distress.  HENT:  Head: Normocephalic and atraumatic.  Right Ear: External ear normal.  Left Ear: External ear normal.  Nose: Nose normal.  Mouth/Throat: Oropharynx is clear and moist. No oropharyngeal exudate.  Eyes: Conjunctivae are normal. Pupils are equal, round,  and reactive to light. Right eye exhibits no discharge. Left eye exhibits no discharge. No scleral icterus.  Neck: Normal range of motion. Neck supple. No tracheal deviation present. No thyromegaly present.  Cardiovascular: Normal rate, regular rhythm, normal heart sounds and intact distal pulses.  Exam reveals no gallop and no friction rub.   No murmur heard. Pulmonary/Chest: Effort normal and breath sounds normal. No respiratory distress. She has no wheezes. She has no rales. She exhibits no tenderness.  Musculoskeletal: Normal range of motion. She exhibits no edema or tenderness.  Lymphadenopathy:    She has no cervical adenopathy.  Neurological: She is alert and oriented to person, place, and time. No cranial nerve deficit. She exhibits normal  muscle tone. Coordination normal.  Skin: Skin is warm and dry. Rash noted. Rash is maculopapular. She is not diaphoretic. No erythema. No pallor.     Psychiatric: She has a normal mood and affect. Her behavior is normal. Judgment and thought content normal.          Assessment & Plan:   Problem List Items Addressed This Visit      Unprioritized   Angular cheilitis    Will start topical Nystatin-Triamcinolone. Follow up if symptoms not improving.      Chronic kidney disease    Followed by Dr. Holley Raring. Will repeat labs today.      Hypertension - Primary    BP Readings from Last 3 Encounters:  03/27/15 116/70  09/25/14 109/70  09/24/14 128/78   BP well controlled. Renal function with labs. Continue current medication.      Relevant Orders   Comprehensive metabolic panel   Lipid panel   Microalbumin / creatinine urine ratio   Hypothyroidism    Will check TSH with labs. Continue Levothyroxine.      Relevant Orders   TSH       Return in about 6 months (around 09/24/2015) for Recheck.

## 2015-03-27 NOTE — Addendum Note (Signed)
Addended by: Vernetta Honey on: 03/27/2015 11:18 AM   Modules accepted: Orders

## 2015-03-28 ENCOUNTER — Telehealth: Payer: Self-pay | Admitting: Internal Medicine

## 2015-03-28 NOTE — Telephone Encounter (Signed)
Spoke with pharmacy, gave them approval to separate medications.  Notified pt of change

## 2015-03-28 NOTE — Telephone Encounter (Signed)
Patient was prescribed nystatin-triamcinolone.  Please advise if there is something else she can try at a lower cost?

## 2015-03-28 NOTE — Telephone Encounter (Signed)
Pt called. She saw Dr. Gilford Rile. Dr. Gilford Rile gave her a cream prescription. The cost is too high for pt. Dr. Gilford Rile told her to call back and she would change the prescription.  Please call pt [phone], at home.

## 2015-03-28 NOTE — Telephone Encounter (Signed)
I think Almyra Free called in the generics as a separate Rx

## 2015-04-01 ENCOUNTER — Other Ambulatory Visit (INDEPENDENT_AMBULATORY_CARE_PROVIDER_SITE_OTHER): Payer: PPO

## 2015-04-01 ENCOUNTER — Telehealth: Payer: Self-pay | Admitting: *Deleted

## 2015-04-01 DIAGNOSIS — E875 Hyperkalemia: Secondary | ICD-10-CM | POA: Diagnosis not present

## 2015-04-01 NOTE — Telephone Encounter (Signed)
Labs and dx?  

## 2015-04-01 NOTE — Telephone Encounter (Signed)
*  below. 

## 2015-04-01 NOTE — Telephone Encounter (Signed)
BMP for hyperkalemia 

## 2015-04-02 LAB — BASIC METABOLIC PANEL
BUN: 29 mg/dL — AB (ref 6–23)
CO2: 26 mEq/L (ref 19–32)
CREATININE: 1.75 mg/dL — AB (ref 0.40–1.20)
Calcium: 9.2 mg/dL (ref 8.4–10.5)
Chloride: 105 mEq/L (ref 96–112)
GFR: 30.2 mL/min — ABNORMAL LOW (ref >60.00)
GLUCOSE: 99 mg/dL (ref 70–99)
Potassium: 4.6 mEq/L (ref 3.5–5.1)
Sodium: 139 mEq/L (ref 135–145)

## 2015-04-11 DIAGNOSIS — M25531 Pain in right wrist: Secondary | ICD-10-CM | POA: Diagnosis not present

## 2015-04-11 DIAGNOSIS — M65311 Trigger thumb, right thumb: Secondary | ICD-10-CM | POA: Diagnosis not present

## 2015-05-02 ENCOUNTER — Telehealth: Payer: Self-pay | Admitting: Internal Medicine

## 2015-05-02 MED ORDER — LOSARTAN POTASSIUM 25 MG PO TABS: 25.0000 mg | ORAL_TABLET | Freq: Every day | ORAL | Status: DC

## 2015-05-02 NOTE — Telephone Encounter (Signed)
Pt would like to have her losartan (COZAAR) 25 MG tablet refilled. Her Pharmacy is [facility] on [address]. She does not want this refilled by mail.

## 2015-05-02 NOTE — Telephone Encounter (Signed)
Refilled to [facility]

## 2015-07-04 DIAGNOSIS — N183 Chronic kidney disease, stage 3 (moderate): Secondary | ICD-10-CM | POA: Diagnosis not present

## 2015-07-04 DIAGNOSIS — E559 Vitamin D deficiency, unspecified: Secondary | ICD-10-CM | POA: Diagnosis not present

## 2015-07-04 DIAGNOSIS — R809 Proteinuria, unspecified: Secondary | ICD-10-CM | POA: Diagnosis not present

## 2015-07-04 DIAGNOSIS — I10 Essential (primary) hypertension: Secondary | ICD-10-CM | POA: Diagnosis not present

## 2015-08-13 ENCOUNTER — Other Ambulatory Visit: Payer: Self-pay | Admitting: Internal Medicine

## 2015-09-06 ENCOUNTER — Other Ambulatory Visit: Payer: Self-pay | Admitting: Internal Medicine

## 2015-09-06 DIAGNOSIS — Z1231 Encounter for screening mammogram for malignant neoplasm of breast: Secondary | ICD-10-CM

## 2015-09-19 DIAGNOSIS — Z961 Presence of intraocular lens: Secondary | ICD-10-CM | POA: Diagnosis not present

## 2015-09-24 ENCOUNTER — Ambulatory Visit: Payer: Self-pay | Admitting: Internal Medicine

## 2015-09-25 ENCOUNTER — Ambulatory Visit: Payer: Commercial Managed Care - HMO

## 2015-09-25 ENCOUNTER — Ambulatory Visit: Payer: Self-pay | Admitting: Internal Medicine

## 2015-09-25 DIAGNOSIS — Z0000 Encounter for general adult medical examination without abnormal findings: Secondary | ICD-10-CM | POA: Diagnosis not present

## 2015-09-30 ENCOUNTER — Ambulatory Visit: Payer: Self-pay

## 2015-10-15 ENCOUNTER — Encounter: Payer: Self-pay | Admitting: Internal Medicine

## 2015-10-15 ENCOUNTER — Encounter (INDEPENDENT_AMBULATORY_CARE_PROVIDER_SITE_OTHER): Payer: Self-pay

## 2015-10-15 ENCOUNTER — Ambulatory Visit (INDEPENDENT_AMBULATORY_CARE_PROVIDER_SITE_OTHER): Payer: PPO | Admitting: Internal Medicine

## 2015-10-15 VITALS — BP 130/68 | HR 85 | Ht 64.0 in | Wt 160.8 lb

## 2015-10-15 DIAGNOSIS — E039 Hypothyroidism, unspecified: Secondary | ICD-10-CM | POA: Diagnosis not present

## 2015-10-15 DIAGNOSIS — I10 Essential (primary) hypertension: Secondary | ICD-10-CM | POA: Diagnosis not present

## 2015-10-15 LAB — COMPREHENSIVE METABOLIC PANEL
ALK PHOS: 84 U/L (ref 39–117)
ALT: 19 U/L (ref 0–35)
AST: 24 U/L (ref 0–37)
Albumin: 3.9 g/dL (ref 3.5–5.2)
BUN: 27 mg/dL — ABNORMAL HIGH (ref 6–23)
CHLORIDE: 105 meq/L (ref 96–112)
CO2: 27 mEq/L (ref 19–32)
Calcium: 9.3 mg/dL (ref 8.4–10.5)
Creatinine, Ser: 1.62 mg/dL — ABNORMAL HIGH (ref 0.40–1.20)
GFR: 32.97 mL/min — AB (ref >60.00)
GLUCOSE: 99 mg/dL (ref 70–99)
POTASSIUM: 4.9 meq/L (ref 3.5–5.1)
Sodium: 138 mEq/L (ref 135–145)
TOTAL PROTEIN: 7.3 g/dL (ref 6.0–8.3)
Total Bilirubin: 0.6 mg/dL (ref 0.2–1.2)

## 2015-10-15 LAB — TSH: TSH: 0.78 u[IU]/mL (ref 0.35–4.50)

## 2015-10-15 MED ORDER — ATORVASTATIN CALCIUM 10 MG PO TABS: ORAL_TABLET | ORAL | 3 refills | Status: DC

## 2015-10-15 MED ORDER — FEXOFENADINE-PSEUDOEPHED ER 180-240 MG PO TB24: 1.0000 | ORAL_TABLET | Freq: Every day | ORAL | 3 refills | Status: DC | PRN

## 2015-10-15 MED ORDER — AMLODIPINE BESYLATE 2.5 MG PO TABS: 2.5000 mg | ORAL_TABLET | Freq: Every day | ORAL | 3 refills | Status: DC

## 2015-10-15 MED ORDER — ASPIRIN 81 MG PO TABS: 81.0000 mg | ORAL_TABLET | Freq: Every day | ORAL | 3 refills | Status: AC

## 2015-10-15 MED ORDER — LEVOTHYROXINE SODIUM 75 MCG PO TABS: ORAL_TABLET | ORAL | 3 refills | Status: DC

## 2015-10-15 MED ORDER — LOSARTAN POTASSIUM 25 MG PO TABS: 25.0000 mg | ORAL_TABLET | Freq: Every day | ORAL | 5 refills | Status: DC

## 2015-10-15 MED ORDER — OMEPRAZOLE 20 MG PO CPDR: 20.0000 mg | DELAYED_RELEASE_CAPSULE | Freq: Every day | ORAL | 3 refills | Status: DC

## 2015-10-15 MED ORDER — VITAMIN D 1000 UNITS PO TABS: 1000.0000 [IU] | ORAL_TABLET | Freq: Two times a day (BID) | ORAL | 2 refills | Status: DC

## 2015-10-15 MED ORDER — DULOXETINE HCL 60 MG PO CPEP: 60.0000 mg | ORAL_CAPSULE | Freq: Every day | ORAL | 3 refills | Status: DC

## 2015-10-15 MED ORDER — GLUCOSAMINE-CHONDROITIN 500-400 MG PO TABS: 1.0000 | ORAL_TABLET | Freq: Three times a day (TID) | ORAL | 3 refills | Status: DC

## 2015-10-15 NOTE — Patient Instructions (Signed)
Labs today.  Follow up with Dr. Nicki Reaper in 4 weeks.

## 2015-10-15 NOTE — Assessment & Plan Note (Signed)
BP Readings from Last 3 Encounters:  10/15/15 130/68  03/27/15 116/70  09/25/14 109/70   BP well controlled. Continue current medications. Renal function with labs.

## 2015-10-15 NOTE — Progress Notes (Signed)
Pre visit review using our clinic review tool, if applicable. No additional management support is needed unless otherwise documented below in the visit note. 

## 2015-10-15 NOTE — Assessment & Plan Note (Signed)
Will check TSH with labs. Continue Levothyroxine. 

## 2015-10-15 NOTE — Progress Notes (Signed)
Subjective:    Patient ID: Caitlin Lin, female    DOB: May 28, 1940, 75 y.o.   MRN: [id number]  HPI  75YO female presents for follow up.  Scheduled for mammogram tomorrow.  Feeling well. No concerns today. Compliant with medications. No CP, HA, palpitations.   Wt Readings from Last 3 Encounters:  10/15/15 160 lb 12.8 oz (72.9 kg)  03/27/15 157 lb 4 oz (71.3 kg)  09/25/14 161 lb 6 oz (73.2 kg)   BP Readings from Last 3 Encounters:  10/15/15 130/68  03/27/15 116/70  09/25/14 109/70    Past Medical History:  Diagnosis Date  . Chronic kidney disease    Followed by Dr. Holley Raring  . Melanoma of lower leg ([place]) 2013  . Shingles 2013  . Spinal stenosis in cervical region    Dr. Sharlet Salina   Family History  Problem Relation Age of Onset  . Arthritis Mother   . Liver disease Mother   . Cirrhosis Mother   . Heart disease Father   . Heart attack Father   . Breast cancer Other   . Depression Brother   . Breast cancer Cousin     maternal cousins   Past Surgical History:  Procedure Laterality Date  . ABDOMINAL HYSTERECTOMY  1974  . BREAST BIOPSY  1963   Benign per pt's report  . LUNG BIOPSY  2007   Benign per pt's report   Social History   Social History  . Marital status: Married    Spouse name: N/A  . Number of children: 2  . Years of education: N/A   Occupational History  . Retired    Social History Main Topics  . Smoking status: Never Smoker  . Smokeless tobacco: Never Used  . Alcohol use No  . Drug use: No  . Sexual activity: Yes   Other Topics Concern  . None   Social History Narrative   Lives in [place] with husband. Has 2 children boy and girl, 4 grandchildren. Retired Presenter, broadcasting.       Daily Caffeine Use:  NO   Regular Exercise -  Not at this time due to back pain, would like to resume soon    Review of Systems  Constitutional: Negative for appetite change, chills, fatigue, fever and unexpected weight change.  Eyes: Negative for visual  disturbance.  Respiratory: Negative for shortness of breath.   Cardiovascular: Negative for chest pain and leg swelling.  Gastrointestinal: Negative for abdominal pain, constipation, diarrhea, nausea and vomiting.  Musculoskeletal: Negative for arthralgias and myalgias.  Skin: Negative for color change and rash.  Hematological: Negative for adenopathy. Does not bruise/bleed easily.  Psychiatric/Behavioral: Negative for dysphoric mood and sleep disturbance. The patient is not nervous/anxious.        Objective:    BP 130/68 (BP Location: Left Arm, Patient Position: Sitting, Cuff Size: Large)   Pulse 85   Ht '5\' 4"'$  (1.626 m)   Wt 160 lb 12.8 oz (72.9 kg)   SpO2 95%   BMI 27.60 kg/m  Physical Exam  Constitutional: She is oriented to person, place, and time. She appears well-developed and well-nourished. No distress.  HENT:  Head: Normocephalic and atraumatic.  Right Ear: External ear normal.  Left Ear: External ear normal.  Nose: Nose normal.  Mouth/Throat: Oropharynx is clear and moist. No oropharyngeal exudate.  Eyes: Conjunctivae are normal. Pupils are equal, round, and reactive to light. Right eye exhibits no discharge. Left eye exhibits no discharge. No scleral icterus.  Neck:  Normal range of motion. Neck supple. No tracheal deviation present. No thyromegaly present.  Cardiovascular: Normal rate, regular rhythm, normal heart sounds and intact distal pulses.  Exam reveals no gallop and no friction rub.   No murmur heard. Pulmonary/Chest: Effort normal and breath sounds normal. No respiratory distress. She has no wheezes. She has no rales. She exhibits no tenderness.  Musculoskeletal: Normal range of motion. She exhibits no edema or tenderness.  Lymphadenopathy:    She has no cervical adenopathy.  Neurological: She is alert and oriented to person, place, and time. No cranial nerve deficit. She exhibits normal muscle tone. Coordination normal.  Skin: Skin is warm and dry. No rash  noted. She is not diaphoretic. No erythema. No pallor.  Psychiatric: She has a normal mood and affect. Her behavior is normal. Judgment and thought content normal.          Assessment & Plan:   Problem List Items Addressed This Visit      Unprioritized   Hypertension - Primary (Chronic)    BP Readings from Last 3 Encounters:  10/15/15 130/68  03/27/15 116/70  09/25/14 109/70   BP well controlled. Continue current medications. Renal function with labs.      Relevant Medications   amLODipine (NORVASC) 2.5 MG tablet   aspirin 81 MG tablet   atorvastatin (LIPITOR) 10 MG tablet   losartan (COZAAR) 25 MG tablet   Other Relevant Orders   Comprehensive metabolic panel   Hypothyroidism (Chronic)    Will check TSH with labs. Continue Levothyroxine.      Relevant Medications   levothyroxine (SYNTHROID, LEVOTHROID) 75 MCG tablet   Other Relevant Orders   TSH    Other Visit Diagnoses   None.      Return in about 4 weeks (around 11/12/2015) for New Patient.  Ronette Deter, MD [facility]

## 2015-10-16 ENCOUNTER — Ambulatory Visit
Admission: RE | Admit: 2015-10-16 | Discharge: 2015-10-16 | Disposition: A | Discharge status: Home / Self Care | Payer: PPO | Source: Ambulatory Visit | Attending: Internal Medicine | Admitting: Internal Medicine

## 2015-10-16 ENCOUNTER — Other Ambulatory Visit: Payer: Self-pay | Admitting: Internal Medicine

## 2015-10-16 DIAGNOSIS — Z1231 Encounter for screening mammogram for malignant neoplasm of breast: Secondary | ICD-10-CM | POA: Diagnosis not present

## 2015-10-24 DIAGNOSIS — R809 Proteinuria, unspecified: Secondary | ICD-10-CM | POA: Diagnosis not present

## 2015-10-24 DIAGNOSIS — N2581 Secondary hyperparathyroidism of renal origin: Secondary | ICD-10-CM | POA: Diagnosis not present

## 2015-10-24 DIAGNOSIS — N183 Chronic kidney disease, stage 3 (moderate): Secondary | ICD-10-CM | POA: Diagnosis not present

## 2015-10-24 DIAGNOSIS — I10 Essential (primary) hypertension: Secondary | ICD-10-CM | POA: Diagnosis not present

## 2015-12-31 ENCOUNTER — Ambulatory Visit: Payer: Self-pay | Admitting: Internal Medicine

## 2016-01-01 ENCOUNTER — Ambulatory Visit: Payer: Self-pay | Admitting: Internal Medicine

## 2016-01-24 ENCOUNTER — Ambulatory Visit: Payer: Self-pay | Admitting: Internal Medicine

## 2016-02-04 ENCOUNTER — Ambulatory Visit (INDEPENDENT_AMBULATORY_CARE_PROVIDER_SITE_OTHER): Payer: PPO | Admitting: Internal Medicine

## 2016-02-04 ENCOUNTER — Encounter: Payer: Self-pay | Admitting: Internal Medicine

## 2016-02-04 DIAGNOSIS — E039 Hypothyroidism, unspecified: Secondary | ICD-10-CM

## 2016-02-04 DIAGNOSIS — Z1239 Encounter for other screening for malignant neoplasm of breast: Secondary | ICD-10-CM

## 2016-02-04 DIAGNOSIS — E7800 Pure hypercholesterolemia, unspecified: Secondary | ICD-10-CM | POA: Diagnosis not present

## 2016-02-04 DIAGNOSIS — K623 Rectal prolapse: Secondary | ICD-10-CM | POA: Diagnosis not present

## 2016-02-04 DIAGNOSIS — I10 Essential (primary) hypertension: Secondary | ICD-10-CM

## 2016-02-04 DIAGNOSIS — N189 Chronic kidney disease, unspecified: Secondary | ICD-10-CM

## 2016-02-04 DIAGNOSIS — K219 Gastro-esophageal reflux disease without esophagitis: Secondary | ICD-10-CM

## 2016-02-04 DIAGNOSIS — Z1231 Encounter for screening mammogram for malignant neoplasm of breast: Secondary | ICD-10-CM | POA: Diagnosis not present

## 2016-02-04 DIAGNOSIS — D509 Iron deficiency anemia, unspecified: Secondary | ICD-10-CM

## 2016-02-04 MED ORDER — ATORVASTATIN CALCIUM 10 MG PO TABS: ORAL_TABLET | ORAL | 3 refills | Status: DC

## 2016-02-04 MED ORDER — DULOXETINE HCL 60 MG PO CPEP: 60.0000 mg | ORAL_CAPSULE | Freq: Every day | ORAL | 1 refills | Status: DC

## 2016-02-04 MED ORDER — AMLODIPINE BESYLATE 2.5 MG PO TABS: 2.5000 mg | ORAL_TABLET | Freq: Every day | ORAL | 3 refills | Status: DC

## 2016-02-04 MED ORDER — LEVOTHYROXINE SODIUM 75 MCG PO TABS: ORAL_TABLET | ORAL | 3 refills | Status: DC

## 2016-02-04 NOTE — Progress Notes (Signed)
Patient ID: Caitlin Lin, female   DOB: 1940-09-28, 75 y.o.   MRN: [id number]   Subjective:    Patient ID: Caitlin Lin, female    DOB: 11-25-1940, 75 y.o.   MRN: [id number]  HPI  Patient here for a scheduled follow up and to establish care with me.  Former pt of Dr Gilford Rile.  She sees Dr Holley Raring for CKD.  GFR - stable last check.  Last seen 10/2015.  Due for f/u in 02/2016.   Also has secondary hyperparathyroidism and vitamin D deficiency.  Followed by Dr Holley Raring for this as well.  She is walking.  No chest pain.  Breathing stable.  Some constipation.  Had colonoscopy - 2013.  Had a rectocele and urinary issues.  Had surgery.  With persistent issues now.  She has to wear pads.  Discussed adding fiber.  She wants to try this prior to referral back to urogyn.     Past Medical History:  Diagnosis Date  . Chronic kidney disease    Followed by Dr. Holley Raring  . Melanoma of lower leg ([place]) 2013  . Shingles 2013  . Spinal stenosis in cervical region    Dr. Sharlet Salina   Past Surgical History:  Procedure Laterality Date  . ABDOMINAL HYSTERECTOMY  1974  . BREAST BIOPSY  1963   Benign per pt's report  . LUNG BIOPSY  2007   Benign per pt's report   Family History  Problem Relation Age of Onset  . Arthritis Mother   . Liver disease Mother   . Cirrhosis Mother   . Heart disease Father   . Heart attack Father   . Depression Brother   . Breast cancer Other   . Breast cancer Cousin     maternal cousins   Social History   Social History  . Marital status: Married    Spouse name: N/A  . Number of children: 2  . Years of education: N/A   Occupational History  . Retired    Social History Main Topics  . Smoking status: Never Smoker  . Smokeless tobacco: Never Used  . Alcohol use No  . Drug use: No  . Sexual activity: Yes   Other Topics Concern  . None   Social History Narrative   Lives in [place] with husband. Has 2 children boy and girl, 4 grandchildren. Retired Presenter, broadcasting.       Daily Caffeine Use:  NO   Regular Exercise -  Not at this time due to back pain, would like to resume soon    Outpatient Encounter Prescriptions as of 02/04/2016  Medication Sig  . amLODipine (NORVASC) 2.5 MG tablet Take 1 tablet (2.5 mg total) by mouth daily.  Marland Kitchen aspirin 81 MG tablet Take 1 tablet (81 mg total) by mouth daily.  Marland Kitchen atorvastatin (LIPITOR) 10 MG tablet TAKE 1 TABLET EVERY DAY  AT  6PM  . cholecalciferol (VITAMIN D) 1000 units tablet Take 1 tablet (1,000 Units total) by mouth 2 (two) times daily. (Patient taking differently: Take 2,000 Units by mouth daily. )  . CRANBERRY PO Take 3,600 mg by mouth daily.    . DULoxetine (CYMBALTA) 60 MG capsule Take 1 capsule (60 mg total) by mouth daily.  . fexofenadine-pseudoephedrine (ALLEGRA-D 24) 180-240 MG 24 hr tablet Take 1 tablet by mouth daily as needed.  Marland Kitchen glucosamine-chondroitin 500-400 MG tablet Take 1 tablet by mouth 3 (three) times daily.  Marland Kitchen levothyroxine (SYNTHROID, LEVOTHROID) 75 MCG tablet TAKE 1 TABLET EVERY DAY  BEFORE BREAKFAST  . losartan (COZAAR) 25 MG tablet Take 1 tablet (25 mg total) by mouth daily.  . Multiple Vitamins-Minerals (CENTRUM SILVER ULTRA WOMENS) TABS Take 1 tablet by mouth daily.    Marland Kitchen nystatin-triamcinolone ointment (MYCOLOG) Apply 1 application topically 2 (two) times daily.  . Omega-3 Fatty Acids (FISH OIL) 1000 MG CAPS Take 1,000 mg by mouth daily.  Marland Kitchen omeprazole (PRILOSEC) 20 MG capsule Take 1 capsule (20 mg total) by mouth daily.  . [DISCONTINUED] amLODipine (NORVASC) 2.5 MG tablet Take 1 tablet (2.5 mg total) by mouth daily.  . [DISCONTINUED] atorvastatin (LIPITOR) 10 MG tablet TAKE 1 TABLET EVERY DAY  AT  6PM  . [DISCONTINUED] DULoxetine (CYMBALTA) 60 MG capsule Take 1 capsule (60 mg total) by mouth daily.  . [DISCONTINUED] levothyroxine (SYNTHROID, LEVOTHROID) 75 MCG tablet TAKE 1 TABLET EVERY DAY BEFORE BREAKFAST   No facility-administered encounter medications on file as of 02/04/2016.     Review  of Systems  Constitutional: Negative for appetite change and unexpected weight change.  HENT: Negative for congestion and sinus pressure.   Respiratory: Negative for cough, chest tightness and shortness of breath.   Cardiovascular: Negative for chest pain, palpitations and leg swelling.  Gastrointestinal: Positive for constipation. Negative for abdominal pain, diarrhea, nausea and vomiting.  Genitourinary: Negative for difficulty urinating and dysuria.  Musculoskeletal: Negative for joint swelling and myalgias.  Skin: Negative for color change and rash.  Neurological: Negative for dizziness, light-headedness and headaches.  Psychiatric/Behavioral: Negative for agitation and dysphoric mood.       Objective:    Physical Exam  Constitutional: She appears well-developed and well-nourished. No distress.  HENT:  Nose: Nose normal.  Mouth/Throat: Oropharynx is clear and moist.  Neck: Neck supple. No thyromegaly present.  Cardiovascular: Normal rate and regular rhythm.   Pulmonary/Chest: Breath sounds normal. No respiratory distress. She has no wheezes.  Abdominal: Soft. Bowel sounds are normal. There is no tenderness.  Musculoskeletal: She exhibits no edema or tenderness.  Lymphadenopathy:    She has no cervical adenopathy.  Skin: No rash noted. No erythema.  Psychiatric: She has a normal mood and affect. Her behavior is normal.    BP (!) 150/90   Pulse 76   Temp 98.2 F (36.8 C) (Oral)   Ht '5\' 4"'$  (1.626 m)   Wt 160 lb 9.6 oz (72.8 kg)   SpO2 97%   BMI 27.57 kg/m  Wt Readings from Last 3 Encounters:  02/04/16 160 lb 9.6 oz (72.8 kg)  10/15/15 160 lb 12.8 oz (72.9 kg)  03/27/15 157 lb 4 oz (71.3 kg)     Lab Results  Component Value Date   WBC 4.6 09/08/2013   HGB 12.3 09/08/2013   HCT 35.9 (L) 09/08/2013   PLT 197.0 09/08/2013   GLUCOSE 99 10/15/2015   CHOL 214 (H) 03/27/2015   TRIG 169.0 (H) 03/27/2015   HDL 43.20 03/27/2015   LDLCALC 137 (H) 03/27/2015   ALT 19  10/15/2015   AST 24 10/15/2015   NA 138 10/15/2015   K 4.9 10/15/2015   CL 105 10/15/2015   CREATININE 1.62 (H) 10/15/2015   BUN 27 (H) 10/15/2015   CO2 27 10/15/2015   TSH 0.78 10/15/2015   MICROALBUR 2.1 (H) 03/27/2015    Mm Screening Breast Tomo Bilateral  Result Date: 10/16/2015 CLINICAL DATA:  Screening. EXAM: 2D DIGITAL SCREENING BILATERAL MAMMOGRAM WITH CAD AND ADJUNCT TOMO COMPARISON:  Previous exam(s). ACR Breast Density Category b: There are scattered areas of fibroglandular density.  FINDINGS: There are no findings suspicious for malignancy. Images were processed with CAD. IMPRESSION: No mammographic evidence of malignancy. A result letter of this screening mammogram will be mailed directly to the patient. RECOMMENDATION: Screening mammogram in one year. (Code:SM-B-01Y) BI-RADS CATEGORY  1: Negative. Electronically Signed   By: Pamelia Hoit M.D.   On: 10/16/2015 13:47       Assessment & Plan:   Problem List Items Addressed This Visit    Anemia, iron deficiency    Has a documented history of anemia.  Recheck cbc with next labs.        Relevant Orders   CBC with Differential/Platelet   Ferritin   Chronic kidney disease    Followed by nephrology.  Due f/u in 02/2016.        GERD (gastroesophageal reflux disease)    Controlled on omeprazole.       Hypercholesterolemia    Low cholesterol diet and exercise.  Follow lipid panel.        Relevant Medications   amLODipine (NORVASC) 2.5 MG tablet   atorvastatin (LIPITOR) 10 MG tablet   Other Relevant Orders   Hepatic function panel   Lipid panel   Hypertension (Chronic)    Blood pressure elevated today.  She will spot check her pressure.  May need to increase her medication.  Follow metabolic panel.        Relevant Medications   amLODipine (NORVASC) 2.5 MG tablet   atorvastatin (LIPITOR) 10 MG tablet   Other Relevant Orders   Basic metabolic panel   Hypothyroidism (Chronic)    On thyroid replacement.  Follow tsh.         Relevant Medications   levothyroxine (SYNTHROID, LEVOTHROID) 75 MCG tablet   Other Relevant Orders   TSH   Rectal prolapse    S/p urogynecology evaluation and surgery.  Still with persistent issues.  Will add fiber.  Wants to try this first.  If persistent problems, will refer back to urogyn.       Screening for breast cancer    10/16/15- mammogram - [facility].           Einar Pheasant, MD

## 2016-02-04 NOTE — Progress Notes (Signed)
Pre visit review using our clinic review tool, if applicable. No additional management support is needed unless otherwise documented below in the visit note. 

## 2016-02-08 ENCOUNTER — Encounter: Payer: Self-pay | Admitting: Internal Medicine

## 2016-02-08 NOTE — Assessment & Plan Note (Signed)
Followed by nephrology.  Due f/u in 02/2016.

## 2016-02-08 NOTE — Assessment & Plan Note (Signed)
Blood pressure elevated today.  She will spot check her pressure.  May need to increase her medication.  Follow metabolic panel.

## 2016-02-08 NOTE — Assessment & Plan Note (Signed)
Controlled on omeprazole.   

## 2016-02-08 NOTE — Assessment & Plan Note (Signed)
Low cholesterol diet and exercise.  Follow lipid panel.   

## 2016-02-08 NOTE — Assessment & Plan Note (Signed)
S/p urogynecology evaluation and surgery.  Still with persistent issues.  Will add fiber.  Wants to try this first.  If persistent problems, will refer back to urogyn.

## 2016-02-08 NOTE — Assessment & Plan Note (Signed)
Has a documented history of anemia.  Recheck cbc with next labs.

## 2016-02-08 NOTE — Assessment & Plan Note (Signed)
On thyroid replacement.  Follow tsh.  

## 2016-02-08 NOTE — Assessment & Plan Note (Signed)
10/16/15- mammogram - [facility].

## 2016-02-24 DIAGNOSIS — N2581 Secondary hyperparathyroidism of renal origin: Secondary | ICD-10-CM | POA: Diagnosis not present

## 2016-02-24 DIAGNOSIS — I10 Essential (primary) hypertension: Secondary | ICD-10-CM | POA: Diagnosis not present

## 2016-02-24 DIAGNOSIS — N183 Chronic kidney disease, stage 3 (moderate): Secondary | ICD-10-CM | POA: Diagnosis not present

## 2016-02-24 DIAGNOSIS — R809 Proteinuria, unspecified: Secondary | ICD-10-CM | POA: Diagnosis not present

## 2016-03-02 ENCOUNTER — Other Ambulatory Visit (INDEPENDENT_AMBULATORY_CARE_PROVIDER_SITE_OTHER): Payer: PPO

## 2016-03-02 DIAGNOSIS — I10 Essential (primary) hypertension: Secondary | ICD-10-CM

## 2016-03-02 DIAGNOSIS — E7800 Pure hypercholesterolemia, unspecified: Secondary | ICD-10-CM | POA: Diagnosis not present

## 2016-03-02 DIAGNOSIS — D509 Iron deficiency anemia, unspecified: Secondary | ICD-10-CM

## 2016-03-02 DIAGNOSIS — E039 Hypothyroidism, unspecified: Secondary | ICD-10-CM

## 2016-03-02 LAB — CBC WITH DIFFERENTIAL/PLATELET
BASOS PCT: 0.6 % (ref 0.0–3.0)
Basophils Absolute: 0 10*3/uL (ref 0.0–0.1)
EOS ABS: 0.1 10*3/uL (ref 0.0–0.7)
EOS PCT: 3.5 % (ref 0.0–5.0)
HEMATOCRIT: 36 % (ref 36.0–46.0)
HEMOGLOBIN: 12.3 g/dL (ref 12.0–15.0)
LYMPHS PCT: 49.2 % — AB (ref 12.0–46.0)
Lymphs Abs: 2 10*3/uL (ref 0.7–4.0)
MCHC: 34.2 g/dL (ref 30.0–36.0)
MCV: 85.1 fl (ref 78.0–100.0)
MONOS PCT: 14 % — AB (ref 3.0–12.0)
Monocytes Absolute: 0.6 10*3/uL (ref 0.1–1.0)
Neutro Abs: 1.3 10*3/uL — ABNORMAL LOW (ref 1.4–7.7)
Neutrophils Relative %: 32.7 % — ABNORMAL LOW (ref 43.0–77.0)
Platelets: 194 10*3/uL (ref 150.0–400.0)
RBC: 4.23 Mil/uL (ref 3.87–5.11)
RDW: 13.6 % (ref 11.5–15.5)
WBC: 4.1 10*3/uL (ref 4.0–10.5)

## 2016-03-02 LAB — LIPID PANEL
CHOL/HDL RATIO: 4
Cholesterol: 182 mg/dL (ref 0–200)
HDL: 41.3 mg/dL (ref >39.00)
LDL Cholesterol: 104 mg/dL — ABNORMAL HIGH (ref 0–99)
NonHDL: 140.87
TRIGLYCERIDES: 182 mg/dL — AB (ref 0.0–149.0)
VLDL: 36.4 mg/dL (ref 0.0–40.0)

## 2016-03-02 LAB — BASIC METABOLIC PANEL
BUN: 25 mg/dL — AB (ref 6–23)
CO2: 25 meq/L (ref 19–32)
CREATININE: 1.58 mg/dL — AB (ref 0.40–1.20)
Calcium: 9.2 mg/dL (ref 8.4–10.5)
Chloride: 108 mEq/L (ref 96–112)
GFR: 33.9 mL/min — AB (ref >60.00)
Glucose, Bld: 105 mg/dL — ABNORMAL HIGH (ref 70–99)
Potassium: 5.2 mEq/L — ABNORMAL HIGH (ref 3.5–5.1)
SODIUM: 143 meq/L (ref 135–145)

## 2016-03-02 LAB — HEPATIC FUNCTION PANEL
ALT: 18 U/L (ref 0–35)
AST: 20 U/L (ref 0–37)
Albumin: 3.9 g/dL (ref 3.5–5.2)
Alkaline Phosphatase: 96 U/L (ref 39–117)
BILIRUBIN DIRECT: 0.1 mg/dL (ref 0.0–0.3)
TOTAL PROTEIN: 6.6 g/dL (ref 6.0–8.3)
Total Bilirubin: 0.6 mg/dL (ref 0.2–1.2)

## 2016-03-02 LAB — TSH: TSH: 1.74 u[IU]/mL (ref 0.35–4.50)

## 2016-03-02 LAB — FERRITIN: Ferritin: 56.7 ng/mL (ref 10.0–291.0)

## 2016-03-03 ENCOUNTER — Other Ambulatory Visit: Payer: Self-pay | Admitting: Internal Medicine

## 2016-03-03 DIAGNOSIS — E875 Hyperkalemia: Secondary | ICD-10-CM

## 2016-03-03 NOTE — Progress Notes (Signed)
Order placed for f/u potassium.  

## 2016-03-05 ENCOUNTER — Other Ambulatory Visit (INDEPENDENT_AMBULATORY_CARE_PROVIDER_SITE_OTHER): Payer: PPO

## 2016-03-05 DIAGNOSIS — E875 Hyperkalemia: Secondary | ICD-10-CM

## 2016-03-05 LAB — POTASSIUM: POTASSIUM: 4.7 meq/L (ref 3.5–5.1)

## 2016-04-22 DIAGNOSIS — E875 Hyperkalemia: Secondary | ICD-10-CM | POA: Diagnosis not present

## 2016-04-22 DIAGNOSIS — N183 Chronic kidney disease, stage 3 (moderate): Secondary | ICD-10-CM | POA: Diagnosis not present

## 2016-04-22 DIAGNOSIS — R809 Proteinuria, unspecified: Secondary | ICD-10-CM | POA: Diagnosis not present

## 2016-04-22 DIAGNOSIS — I10 Essential (primary) hypertension: Secondary | ICD-10-CM | POA: Diagnosis not present

## 2016-04-22 DIAGNOSIS — N2581 Secondary hyperparathyroidism of renal origin: Secondary | ICD-10-CM | POA: Diagnosis not present

## 2016-05-11 ENCOUNTER — Encounter: Payer: Self-pay | Admitting: Internal Medicine

## 2016-05-12 ENCOUNTER — Ambulatory Visit (INDEPENDENT_AMBULATORY_CARE_PROVIDER_SITE_OTHER): Payer: PPO | Admitting: Internal Medicine

## 2016-05-12 ENCOUNTER — Encounter: Payer: Self-pay | Admitting: Internal Medicine

## 2016-05-12 VITALS — BP 134/78 | HR 68 | Temp 98.6°F | Resp 16 | Ht 64.0 in | Wt 158.4 lb

## 2016-05-12 DIAGNOSIS — E039 Hypothyroidism, unspecified: Secondary | ICD-10-CM | POA: Diagnosis not present

## 2016-05-12 DIAGNOSIS — I10 Essential (primary) hypertension: Secondary | ICD-10-CM

## 2016-05-12 DIAGNOSIS — Z1231 Encounter for screening mammogram for malignant neoplasm of breast: Secondary | ICD-10-CM | POA: Diagnosis not present

## 2016-05-12 DIAGNOSIS — R1013 Epigastric pain: Secondary | ICD-10-CM

## 2016-05-12 DIAGNOSIS — K219 Gastro-esophageal reflux disease without esophagitis: Secondary | ICD-10-CM

## 2016-05-12 DIAGNOSIS — Z0000 Encounter for general adult medical examination without abnormal findings: Secondary | ICD-10-CM | POA: Diagnosis not present

## 2016-05-12 DIAGNOSIS — Z1239 Encounter for other screening for malignant neoplasm of breast: Secondary | ICD-10-CM

## 2016-05-12 DIAGNOSIS — N189 Chronic kidney disease, unspecified: Secondary | ICD-10-CM

## 2016-05-12 DIAGNOSIS — E7800 Pure hypercholesterolemia, unspecified: Secondary | ICD-10-CM | POA: Diagnosis not present

## 2016-05-12 DIAGNOSIS — R079 Chest pain, unspecified: Secondary | ICD-10-CM

## 2016-05-12 MED ORDER — OMEPRAZOLE 20 MG PO CPDR: 20.0000 mg | DELAYED_RELEASE_CAPSULE | Freq: Two times a day (BID) | ORAL | 1 refills | Status: DC

## 2016-05-12 NOTE — Assessment & Plan Note (Addendum)
Described the pain that starts in the epigastric region and extends up her anterior chest, to her jaw and radiates to her back.  Wakes her.  EKG obtained and revealed SR with no acute ischemic changes when compared to previous.  Given risk factors and symptoms, will refer to cardiology for evaluation and question of need for further w/up/testing.  Symptoms could be related to worsening acid reflux, gastritis, gallbladder, etc.  Will pursue cardiac w/up as outlined.  Also obtain abdominal ultrasound.  Increase prilosec to bid.  Follow closely.  May need referral to GI for f/u EGD.

## 2016-05-12 NOTE — Progress Notes (Signed)
Pre-visit discussion using our clinic review tool. No additional management support is needed unless otherwise documented below in the visit note.  

## 2016-05-12 NOTE — Assessment & Plan Note (Signed)
On thyroid replacement.  Follow tsh.  

## 2016-05-12 NOTE — Assessment & Plan Note (Signed)
Blood pressure better on recheck.  Nephrology just increased losartan.  Follow metabolic panel.  Renal function stable.

## 2016-05-12 NOTE — Patient Instructions (Signed)
Increase prilosec to twice a day.

## 2016-05-12 NOTE — Assessment & Plan Note (Addendum)
Physical today 05/12/16.  Mammogram 10/16/15 - [facility].  colonoscopy 06/24/11.  Recommended f/u 06/2021.

## 2016-05-12 NOTE — Progress Notes (Signed)
Patient ID: Caitlin Lin, female   DOB: [birth year], 76 y.o.   MRN: [id number]   Subjective:    Patient ID: Caitlin Lin, female    DOB: [birth year], 76 y.o.   MRN: [id number]  HPI  Patient with past history of CKD, melanoma and spinal stenosis.  She comes in today to follow up on these issues as well as for a complete physical exam.  Followed by Dr Holley Raring for CKD.  Last evaluated 02/24/16.  Stable.  GFR 31. Her blood pressure was elevated.  He increased her losartan to '50mg'$  q day.  She has been doing well with this.  She does report having increased pain in her upper abdomen/epigastric region.  Wakes her up at night.  Has occurred on 2-3 occasions.  Will notice increased gas.  Pain will extend up into her chest and up to her jaw and radiate to her back.  Last for a few minutes and resolves.  She lies still and resolves.  Has noticed more indigestion if eats spicy or greasy foods.  If she bends over, has increased acid reflux.  Taking miralax and this is helping her bowels.  No change in her bowels.  No nausea or vomiting.  No chest pain or sob with increased activity or exertion.  Has started walking.     Past Medical History:  Diagnosis Date  . Chronic kidney disease    Followed by Dr. Holley Raring  . Melanoma of lower leg ([place]) 2013  . Shingles 2013  . Spinal stenosis in cervical region    Dr. Sharlet Salina   Past Surgical History:  Procedure Laterality Date  . ABDOMINAL HYSTERECTOMY  1974  . BREAST BIOPSY  1963   Benign per pt's report  . LUNG BIOPSY  2007   Benign per pt's report   Family History  Problem Relation Age of Onset  . Arthritis Mother   . Liver disease Mother   . Cirrhosis Mother   . Heart disease Father   . Heart attack Father   . Depression Brother   . Breast cancer Other   . Breast cancer Cousin     maternal cousins   Social History   Social History  . Marital status: Married    Spouse name: N/A  . Number of children: 2  . Years of education: N/A    Occupational History  . Retired    Social History Main Topics  . Smoking status: Never Smoker  . Smokeless tobacco: Never Used  . Alcohol use No  . Drug use: No  . Sexual activity: Yes   Other Topics Concern  . None   Social History Narrative   Lives in [place] with husband. Has 2 children boy and girl, 4 grandchildren. Retired Presenter, broadcasting.       Daily Caffeine Use:  NO   Regular Exercise -  Not at this time due to back pain, would like to resume soon    Outpatient Encounter Prescriptions as of 05/12/2016  Medication Sig  . amLODipine (NORVASC) 2.5 MG tablet Take 1 tablet (2.5 mg total) by mouth daily.  Marland Kitchen aspirin 81 MG tablet Take 1 tablet (81 mg total) by mouth daily.  Marland Kitchen atorvastatin (LIPITOR) 10 MG tablet TAKE 1 TABLET EVERY DAY  AT  6PM  . cholecalciferol (VITAMIN D) 1000 units tablet Take 1 tablet (1,000 Units total) by mouth 2 (two) times daily. (Patient taking differently: Take 2,000 Units by mouth daily. )  . CRANBERRY PO Take 3,600 mg  by mouth daily.    . DULoxetine (CYMBALTA) 60 MG capsule Take 1 capsule (60 mg total) by mouth daily.  . fexofenadine-pseudoephedrine (ALLEGRA-D 24) 180-240 MG 24 hr tablet Take 1 tablet by mouth daily as needed.  Marland Kitchen glucosamine-chondroitin 500-400 MG tablet Take 1 tablet by mouth 3 (three) times daily.  Marland Kitchen levothyroxine (SYNTHROID, LEVOTHROID) 75 MCG tablet TAKE 1 TABLET EVERY DAY BEFORE BREAKFAST  . losartan (COZAAR) 25 MG tablet Take 1 tablet (25 mg total) by mouth daily. (Patient taking differently: Take 50 mg by mouth daily. )  . Multiple Vitamins-Minerals (CENTRUM SILVER ULTRA WOMENS) TABS Take 1 tablet by mouth daily.    Marland Kitchen nystatin-triamcinolone ointment (MYCOLOG) Apply 1 application topically 2 (two) times daily.  . Omega-3 Fatty Acids (FISH OIL) 1000 MG CAPS Take 1,000 mg by mouth daily.  Marland Kitchen omeprazole (PRILOSEC) 20 MG capsule Take 1 capsule (20 mg total) by mouth 2 (two) times daily before a meal.  . [DISCONTINUED] omeprazole  (PRILOSEC) 20 MG capsule Take 1 capsule (20 mg total) by mouth daily.   No facility-administered encounter medications on file as of 05/12/2016.     Review of Systems  Constitutional: Negative for appetite change and unexpected weight change.  HENT: Negative for congestion and sinus pressure.   Eyes: Negative for pain and visual disturbance.  Respiratory: Negative for cough and shortness of breath.   Cardiovascular: Positive for chest pain. Negative for palpitations and leg swelling.  Gastrointestinal: Positive for abdominal pain. Negative for constipation, diarrhea, nausea and vomiting.  Genitourinary: Negative for difficulty urinating and dysuria.  Musculoskeletal: Negative for joint swelling and myalgias.       Radiation of pain to her back with flare as outlined.  Skin: Negative for color change and rash.  Neurological: Negative for dizziness and headaches.  Hematological: Negative for adenopathy. Does not bruise/bleed easily.  Psychiatric/Behavioral: Negative for decreased concentration and dysphoric mood.       Objective:     Blood pressure rechecked by me:  134/78  Physical Exam  Constitutional: She is oriented to person, place, and time. She appears well-developed and well-nourished. No distress.  HENT:  Nose: Nose normal.  Mouth/Throat: Oropharynx is clear and moist.  Eyes: Right eye exhibits no discharge. Left eye exhibits no discharge. No scleral icterus.  Neck: Neck supple. No thyromegaly present.  Cardiovascular: Normal rate and regular rhythm.   Pulmonary/Chest: Breath sounds normal. No accessory muscle usage. No tachypnea. No respiratory distress. She has no decreased breath sounds. She has no wheezes. She has no rhonchi. Right breast exhibits no inverted nipple, no mass, no nipple discharge and no tenderness (no axillary adenopathy). Left breast exhibits no inverted nipple, no mass, no nipple discharge and no tenderness (no axilarry adenopathy).  Abdominal: Soft.  Bowel sounds are normal.  Very minimal tenderness to palpation - lateral abdomen.    Musculoskeletal: She exhibits no edema or tenderness.  Lymphadenopathy:    She has no cervical adenopathy.  Neurological: She is alert and oriented to person, place, and time.  Skin: Skin is warm. No rash noted. No erythema.  Psychiatric: She has a normal mood and affect. Her behavior is normal.    BP 134/78   Pulse 68   Temp 98.6 F (37 C) (Oral)   Resp 16   Ht '5\' 4"'$  (1.626 m)   Wt 158 lb 6.4 oz (71.8 kg)   SpO2 98%   BMI 27.19 kg/m  Wt Readings from Last 3 Encounters:  05/12/16 158 lb 6.4 oz (  71.8 kg)  02/04/16 160 lb 9.6 oz (72.8 kg)  10/15/15 160 lb 12.8 oz (72.9 kg)     Lab Results  Component Value Date   WBC 4.1 03/02/2016   HGB 12.3 03/02/2016   HCT 36.0 03/02/2016   PLT 194.0 03/02/2016   GLUCOSE 105 (H) 03/02/2016   CHOL 182 03/02/2016   TRIG 182.0 (H) 03/02/2016   HDL 41.30 03/02/2016   LDLCALC 104 (H) 03/02/2016   ALT 18 03/02/2016   AST 20 03/02/2016   NA 143 03/02/2016   K 4.7 03/05/2016   CL 108 03/02/2016   CREATININE 1.58 (H) 03/02/2016   BUN 25 (H) 03/02/2016   CO2 25 03/02/2016   TSH 1.74 03/02/2016   MICROALBUR 2.1 (H) 03/27/2015    Mm Screening Breast Tomo Bilateral  Result Date: 10/16/2015 CLINICAL DATA:  Screening. EXAM: 2D DIGITAL SCREENING BILATERAL MAMMOGRAM WITH CAD AND ADJUNCT TOMO COMPARISON:  Previous exam(s). ACR Breast Density Category b: There are scattered areas of fibroglandular density. FINDINGS: There are no findings suspicious for malignancy. Images were processed with CAD. IMPRESSION: No mammographic evidence of malignancy. A result letter of this screening mammogram will be mailed directly to the patient. RECOMMENDATION: Screening mammogram in one year. (Code:SM-B-01Y) BI-RADS CATEGORY  1: Negative. Electronically Signed   By: Pamelia Hoit M.D.   On: 10/16/2015 13:47       Assessment & Plan:   Problem List Items Addressed This Visit     Chest pain - Primary    Described the pain that starts in the epigastric region and extends up her anterior chest, to her jaw and radiates to her back.  Wakes her.  EKG obtained and revealed SR with no acute ischemic changes when compared to previous.  Given risk factors and symptoms, will refer to cardiology for evaluation and question of need for further w/up/testing.  Symptoms could be related to worsening acid reflux, gastritis, gallbladder, etc.  Will pursue cardiac w/up as outlined.  Also obtain abdominal ultrasound.  Increase prilosec to bid.  Follow closely.  May need referral to GI for f/u EGD.        Relevant Orders   EKG 12-Lead (Completed)   Ambulatory referral to Cardiology   Chronic kidney disease    Followed by nephrology.  Recently checked - stable.        GERD (gastroesophageal reflux disease)    Symptoms as outlined.  Will pursue cardiac w/up as outlined.  Increase prilosec to bid.  Follow.         Relevant Medications   omeprazole (PRILOSEC) 20 MG capsule   Health care maintenance    Physical today 05/12/16.  Mammogram 10/16/15 - Birads I.  colonoscopy 06/24/11.  Recommended f/u 06/2021.        Hypercholesterolemia    Low cholesterol diet and exercise.  On lipitor.  Follow lipid panel and liver function tests.        Hypertension (Chronic)    Blood pressure better on recheck.  Nephrology just increased losartan.  Follow metabolic panel.  Renal function stable.       Hypothyroidism (Chronic)    On thyroid replacement.  Follow tsh.        Other Visit Diagnoses    Screening breast examination       Relevant Orders   MM Digital Screening   Epigastric pain       Relevant Orders   US Abdomen Complete       Einar Pheasant, MD

## 2016-05-18 NOTE — Assessment & Plan Note (Signed)
Followed by nephrology.  Recently checked - stable.

## 2016-05-18 NOTE — Assessment & Plan Note (Addendum)
Symptoms as outlined.  Will pursue cardiac w/up as outlined.  Increase prilosec to bid.  Follow.

## 2016-05-18 NOTE — Assessment & Plan Note (Signed)
Low cholesterol diet and exercise.  On lipitor.  Follow lipid panel and liver function tests.   

## 2016-05-20 ENCOUNTER — Ambulatory Visit (INDEPENDENT_AMBULATORY_CARE_PROVIDER_SITE_OTHER): Payer: PPO | Admitting: Cardiology

## 2016-05-20 ENCOUNTER — Encounter: Payer: Self-pay | Admitting: Cardiology

## 2016-05-20 VITALS — BP 122/78 | HR 85 | Ht 63.5 in | Wt 155.5 lb

## 2016-05-20 DIAGNOSIS — E7849 Other hyperlipidemia: Secondary | ICD-10-CM

## 2016-05-20 DIAGNOSIS — I10 Essential (primary) hypertension: Secondary | ICD-10-CM | POA: Diagnosis not present

## 2016-05-20 DIAGNOSIS — R079 Chest pain, unspecified: Secondary | ICD-10-CM | POA: Diagnosis not present

## 2016-05-20 DIAGNOSIS — R9431 Abnormal electrocardiogram [ECG] [EKG]: Secondary | ICD-10-CM | POA: Diagnosis not present

## 2016-05-20 DIAGNOSIS — E784 Other hyperlipidemia: Secondary | ICD-10-CM | POA: Diagnosis not present

## 2016-05-20 NOTE — Progress Notes (Signed)
Cardiology Office Note   Date:  05/20/2016   ID:  Caitlin Lin, DOB [birth year], MRN [id number]  Referring Doctor:  Einar Pheasant, MD   Cardiologist:   Wende Bushy, MD   Reason for consultation:  Chief Complaint  Patient presents with  . other    Ref by Dr. Einar Pheasant for chest pain. Pt. c/o chest pain that radiates to her back and up into her jaw. Meds reviewed by the pt. verbally.       History of Present Illness: Caitlin Lin is a 76 y.o. female who presents for chest pain. 2 episodes since Christmas last year. She describes the pain as centered in the chest, radiating to the back, and radiating up to the jaw. Consistent discomfort, mild to moderate in intensity, lasting a few minutes at a time, woke he up at night. Somewhat different than her usual reflux or heartburn. Resolved spontaneously.  No shortness of breath, PND, orthopnea, edema.   ROS:  Please see the history of present illness. Aside from mentioned under HPI, all other systems are reviewed and negative.     Past Medical History:  Diagnosis Date  . Chronic kidney disease    Followed by Dr. Holley Raring  . Melanoma of lower leg ([place]) 2013  . Shingles 2013  . Spinal stenosis in cervical region    Dr. Sharlet Salina    Past Surgical History:  Procedure Laterality Date  . ABDOMINAL HYSTERECTOMY  1974  . BREAST BIOPSY  1963   Benign per pt's report  . LUNG BIOPSY  2007   Benign per pt's report     reports that she has never smoked. She has never used smokeless tobacco. She reports that she does not drink alcohol or use drugs.   family history includes Arthritis in her mother; Breast cancer in her cousin and other; Cirrhosis in her mother; Depression in her brother; Heart attack in her father; Heart disease in her father; Liver disease in her mother.   Outpatient Medications Prior to Visit  Medication Sig Dispense Refill  . amLODipine (NORVASC) 2.5 MG tablet Take 1 tablet (2.5 mg total) by mouth daily.  90 tablet 3  . aspirin 81 MG tablet Take 1 tablet (81 mg total) by mouth daily. 30 tablet 3  . atorvastatin (LIPITOR) 10 MG tablet TAKE 1 TABLET EVERY DAY  AT  6PM 90 tablet 3  . cholecalciferol (VITAMIN D) 1000 units tablet Take 1 tablet (1,000 Units total) by mouth 2 (two) times daily. (Patient taking differently: Take 2,000 Units by mouth daily. ) 60 tablet 2  . CRANBERRY PO Take 3,600 mg by mouth daily.      . DULoxetine (CYMBALTA) 60 MG capsule Take 1 capsule (60 mg total) by mouth daily. 90 capsule 1  . fexofenadine-pseudoephedrine (ALLEGRA-D 24) 180-240 MG 24 hr tablet Take 1 tablet by mouth daily as needed. 90 tablet 3  . glucosamine-chondroitin 500-400 MG tablet Take 1 tablet by mouth 3 (three) times daily. 90 tablet 3  . levothyroxine (SYNTHROID, LEVOTHROID) 75 MCG tablet TAKE 1 TABLET EVERY DAY BEFORE BREAKFAST 90 tablet 3  . losartan (COZAAR) 25 MG tablet Take 1 tablet (25 mg total) by mouth daily. (Patient taking differently: Take 50 mg by mouth daily. ) 30 tablet 5  . Multiple Vitamins-Minerals (CENTRUM SILVER ULTRA WOMENS) TABS Take 1 tablet by mouth daily.      . Omega-3 Fatty Acids (FISH OIL) 1000 MG CAPS Take 1,000 mg by mouth daily.    Marland Kitchen  omeprazole (PRILOSEC) 20 MG capsule Take 1 capsule (20 mg total) by mouth 2 (two) times daily before a meal. 180 capsule 1  . nystatin-triamcinolone ointment (MYCOLOG) Apply 1 application topically 2 (two) times daily. (Patient not taking: Reported on 05/20/2016) 30 g 0   No facility-administered medications prior to visit.      Allergies: Quinapril and Codeine    PHYSICAL EXAM: VS:  BP 122/78 (BP Location: Right Arm, Patient Position: Sitting, Cuff Size: Normal)   Pulse 85   Ht 5' 3.5" (1.613 m)   Wt 155 lb 8 oz (70.5 kg)   BMI 27.11 kg/m  , Body mass index is 27.11 kg/m. Wt Readings from Last 3 Encounters:  05/20/16 155 lb 8 oz (70.5 kg)  05/12/16 158 lb 6.4 oz (71.8 kg)  02/04/16 160 lb 9.6 oz (72.8 kg)    GENERAL:  well  developed, well nourished, not in acute distress HEENT: normocephalic, pink conjunctivae, anicteric sclerae, no xanthelasma, normal dentition, oropharynx clear NECK:  no neck vein engorgement, JVP normal, no hepatojugular reflux, carotid upstroke brisk and symmetric, no bruit, no thyromegaly, no lymphadenopathy LUNGS:  good respiratory effort, clear to auscultation bilaterally CV:  PMI not displaced, no thrills, no lifts, S1 and S2 within normal limits, no palpable S3 or S4, no murmurs, no rubs, no gallops ABD:  Soft, nontender, nondistended, normoactive bowel sounds, no abdominal aortic bruit, no hepatomegaly, no splenomegaly MS: nontender back, no kyphosis, no scoliosis, no joint deformities EXT:  2+ DP/PT pulses, no edema, no varicosities, no cyanosis, no clubbing SKIN: warm, nondiaphoretic, normal turgor, no ulcers NEUROPSYCH: alert, oriented to person, place, and time, sensory/motor grossly intact, normal mood, appropriate affect  Recent Labs: 03/02/2016: ALT 18; BUN 25; Creatinine, Ser 1.58; Hemoglobin 12.3; Platelets 194.0; Sodium 143; TSH 1.74 03/05/2016: Potassium 4.7   Lipid Panel    Component Value Date/Time   CHOL 182 03/02/2016 0916   TRIG 182.0 (H) 03/02/2016 0916   HDL 41.30 03/02/2016 0916   CHOLHDL 4 03/02/2016 0916   VLDL 36.4 03/02/2016 0916   LDLCALC 104 (H) 03/02/2016 0916     Other studies Reviewed:  EKG:  The ekg from 05/20/2016 was personally reviewed by me and it revealed sinus rhythm, occasional PVCs, 87 BPM. Low-voltage QRS. Nonspecific ST-T wave changes. Reviewed EKG from 05/12/2016 personally and that showed some Q waves in the inferior leads, abnormal EKG.  Additional studies/ records that were reviewed personally reviewed by me today include: None available   ASSESSMENT AND PLAN: Chest pain Risk factors for CAD include age, hypertension, hyperlipidemia, family history of possibly premature CAD. Father likely developed heart disease and suffered 2  heart attacks in his 62s. Recommend further evaluation with exercise nuclear stress test, echocardiogram.  HTN BP is well controlled. Continue monitoring BP. Continue current medical therapy and lifestyle changes.  Hyperlipidemia PCP following labs. Patient on statin therapy.   Current medicines are reviewed at length with the patient today.  The patient does not have concerns regarding medicines.  Labs/ tests ordered today include:  Orders Placed This Encounter  Procedures  . NM Myocar Multi W/Spect W/Wall Motion / EF  . EKG 12-Lead  . ECHOCARDIOGRAM COMPLETE    I had a lengthy and detailed discussion with the patient regarding diagnoses, prognosis, diagnostic options.  I counseled the patient on importance of lifestyle modification including heart healthy diet, regular physical activity Once cardiac workup is completed.   Disposition:   FU with undersigned after tests   Thank you for  this consultation. We will forwarding this consultation to referring physician.   Signed, Wende Bushy, MD  05/20/2016 9:58 AM    [place]  This note was generated in part with voice recognition software and I apologize for any typographical errors that were not detected and corrected.

## 2016-05-20 NOTE — Patient Instructions (Addendum)
Testing/Procedures: Your physician has requested that you have an echocardiogram. Echocardiography is a painless test that uses sound waves to create images of your heart. It provides your doctor with information about the size and shape of your heart and how well your heart's chambers and valves are working. This procedure takes approximately one hour. There are no restrictions for this procedure.  [place]  Your caregiver has ordered a Stress Test with nuclear imaging. The purpose of this test is to evaluate the blood supply to your heart muscle. This procedure is referred to as a "Non-Invasive Stress Test." This is because other than having an IV started in your vein, nothing is inserted or "invades" your body. Cardiac stress tests are done to find areas of poor blood flow to the heart by determining the extent of coronary artery disease (CAD). Some patients exercise on a treadmill, which naturally increases the blood flow to your heart, while others who are  unable to walk on a treadmill due to physical limitations have a pharmacologic/chemical stress agent called Lexiscan . This medicine will mimic walking on a treadmill by temporarily increasing your coronary blood flow.   Please note: these test may take anywhere between 2-4 hours to complete  PLEASE REPORT TO [facility] AT THE FIRST DESK WILL DIRECT YOU WHERE TO GO  Date of Procedure:_Tuesday May 26, 2016 at 09:00AM__  Arrival Time for Procedure:_Arrive at 08:45AM to register___   PLEASE NOTIFY THE OFFICE AT LEAST 24 HOURS IN ADVANCE IF YOU ARE UNABLE TO [place].  [phone] AND  PLEASE NOTIFY NUCLEAR MEDICINE AT [facility] AT LEAST 24 HOURS IN ADVANCE IF YOU ARE UNABLE TO KEEP YOUR APPOINTMENT. [phone]  How to prepare for your Myoview test:  1. Do not eat or drink after midnight 2. No caffeine for 24 hours prior to test 3. No smoking 24 hours prior to test. 4. Your medication may  be taken with water.  If your doctor stopped a medication because of this test, do not take that medication. 5. Ladies, please do not wear dresses.  Skirts or pants are appropriate. Please wear a short sleeve shirt. 6. No perfume, cologne or lotion. 7. Wear comfortable walking shoes. No heels!   Follow-Up: Your physician recommends that you schedule a follow-up appointment as needed. We will call you with results and if needed schedule follow up at that time.   It was a pleasure seeing you today here in the office. Please do not hesitate to give [place] a call back if you have any further questions. [place], BSN    Echocardiogram An echocardiogram, or echocardiography, uses sound waves (ultrasound) to produce an image of your heart. The echocardiogram is simple, painless, obtained within a short period of time, and offers valuable information to your health care provider. The images from an echocardiogram can provide information such as:  Evidence of coronary artery disease (CAD).  Heart size.  Heart muscle function.  Heart valve function.  Aneurysm detection.  Evidence of a past heart attack.  Fluid buildup around the heart.  Heart muscle thickening.  Assess heart valve function. Tell a health care provider about:  Any allergies you have.  All medicines you are taking, including vitamins, herbs, eye drops, creams, and over-the-counter medicines.  Any problems you or family members have had with anesthetic medicines.  Any blood disorders you have.  Any surgeries you have had.  Any medical conditions you have.  Whether you are pregnant or may be pregnant. What happens before the procedure? No special preparation is needed. Eat and drink normally. What happens during the procedure?  In order to produce an image of your heart, gel will be applied to your chest and a wand-like tool (transducer) will be moved over your chest. The gel will help transmit  the sound waves from the transducer. The sound waves will harmlessly bounce off your heart to allow the heart images to be captured in real-time motion. These images will then be recorded.  You may need an IV to receive a medicine that improves the quality of the pictures. What happens after the procedure? You may return to your normal schedule including diet, activities, and medicines, unless your health care provider tells you otherwise. This information is not intended to replace advice given to you by your health care provider. Make sure you discuss any questions you have with your health care provider. Document Released: 02/28/2000 Document Revised: 10/19/2015 Document Reviewed: 11/07/2012 Elsevier Interactive Patient Education  2017 [employer]. Cardiac Nuclear Scan A cardiac nuclear scan is a test that measures blood flow to the heart when a person is resting and when he or she is exercising. The test looks for problems such as:  Not enough blood reaching a portion of the heart.  The heart muscle not working normally. You may need this test if:  You have heart disease.  You have had abnormal lab results.  You have had heart surgery or angioplasty.  You have chest pain.  You have shortness of breath. In this test, a radioactive dye (tracer) is injected into your bloodstream. After the tracer has traveled to your heart, an imaging device is used to measure how much of the tracer is absorbed by or distributed to various areas of your heart. This procedure is usually done at a hospital and takes 2-4 hours. Tell a health care provider about:  Any allergies you have.  All medicines you are taking, including vitamins, herbs, eye drops, creams, and over-the-counter medicines.  Any problems you or family members have had with the use of anesthetic medicines.  Any blood disorders you have.  Any surgeries you have had.  Any medical conditions you have.  Whether you are pregnant  or may be pregnant. What are the risks? Generally, this is a safe procedure. However, problems may occur, including:  Serious chest pain and heart attack. This is only a risk if the stress portion of the test is done.  Rapid heartbeat.  Sensation of warmth in your chest. This usually passes quickly. What happens before the procedure?  Ask your health care provider about changing or stopping your regular medicines. This is especially important if you are taking diabetes medicines or blood thinners.  Remove your jewelry on the day of the procedure. What happens during the procedure?  An IV tube will be inserted into one of your veins.  Your health care provider will inject a small amount of radioactive tracer through the tube.  You will wait for 20-40 minutes while the tracer travels through your bloodstream.  Your heart activity will be monitored with an electrocardiogram (ECG).  You will lie down on an exam table.  Images of your heart will be taken for about 15-20 minutes.  You may be asked to exercise on a treadmill or stationary bike. While you exercise, your heart's activity will be monitored with an ECG, and your blood pressure will be checked. If you are  unable to exercise, you may be given a medicine to increase blood flow to parts of your heart.  When blood flow to your heart has peaked, a tracer will again be injected through the IV tube.  After 20-40 minutes, you will get back on the exam table and have more images taken of your heart.  When the procedure is over, your IV tube will be removed. The procedure may vary among health care providers and hospitals. Depending on the type of tracer used, scans may need to be repeated 3-4 hours later. What happens after the procedure?  Unless your health care provider tells you otherwise, you may return to your normal schedule, including diet, activities, and medicines.  Unless your health care provider tells you otherwise,  you may increase your fluid intake. This will help flush the contrast dye from your body. Drink enough fluid to keep your urine clear or pale yellow.  It is up to you to get your test results. Ask your health care provider, or the department that is doing the test, when your results will be ready. Summary  A cardiac nuclear scan measures the blood flow to the heart when a person is resting and when he or she is exercising.  You may need this test if you are at risk for heart disease.  Tell your health care provider if you are pregnant.  Unless your health care provider tells you otherwise, increase your fluid intake. This will help flush the contrast dye from your body. Drink enough fluid to keep your urine clear or pale yellow. This information is not intended to replace advice given to you by your health care provider. Make sure you discuss any questions you have with your health care provider. Document Released: 03/27/2004 Document Revised: 03/04/2016 Document Reviewed: 02/08/2013 Elsevier Interactive Patient Education  2017 [employer].

## 2016-05-22 ENCOUNTER — Ambulatory Visit
Admission: RE | Admit: 2016-05-22 | Discharge: 2016-05-22 | Disposition: A | Discharge status: Home / Self Care | Payer: PPO | Source: Ambulatory Visit | Attending: Internal Medicine | Admitting: Internal Medicine

## 2016-05-22 DIAGNOSIS — K760 Fatty (change of) liver, not elsewhere classified: Secondary | ICD-10-CM | POA: Diagnosis not present

## 2016-05-22 DIAGNOSIS — R1013 Epigastric pain: Secondary | ICD-10-CM | POA: Diagnosis not present

## 2016-05-24 ENCOUNTER — Other Ambulatory Visit: Payer: Self-pay | Admitting: Internal Medicine

## 2016-05-24 DIAGNOSIS — K760 Fatty (change of) liver, not elsewhere classified: Secondary | ICD-10-CM

## 2016-05-24 DIAGNOSIS — R1013 Epigastric pain: Secondary | ICD-10-CM

## 2016-05-24 NOTE — Progress Notes (Signed)
Order placed for GI referral.   

## 2016-05-25 ENCOUNTER — Telehealth: Payer: Self-pay | Admitting: Cardiology

## 2016-05-25 NOTE — Telephone Encounter (Signed)
Pt would like to r/s her stress test for tomorrow due to sickness.

## 2016-05-25 NOTE — Telephone Encounter (Signed)
S/w patient. She has not been feeling well and is sick. Rescheduled exercise myoview for 06/03/16 at 0800, arrive at 0745am. Patient verbalized understanding. Called Nuclear Department and cancelled for tomorrow.

## 2016-05-26 ENCOUNTER — Ambulatory Visit: Payer: PPO

## 2016-06-03 ENCOUNTER — Encounter
Admission: RE | Admit: 2016-06-03 | Discharge: 2016-06-03 | Disposition: A | Discharge status: Home / Self Care | Payer: PPO | Source: Ambulatory Visit | Attending: Cardiology | Admitting: Cardiology

## 2016-06-03 DIAGNOSIS — R9431 Abnormal electrocardiogram [ECG] [EKG]: Secondary | ICD-10-CM | POA: Diagnosis not present

## 2016-06-03 DIAGNOSIS — R079 Chest pain, unspecified: Secondary | ICD-10-CM

## 2016-06-03 LAB — NM MYOCAR MULTI W/SPECT W/WALL MOTION / EF
CHL CUP NUCLEAR SDS: 0
CHL CUP NUCLEAR SSS: 5
CHL CUP STRESS STAGE 1 HR: 89 {beats}/min
CHL CUP STRESS STAGE 1 SPEED: 0 mph
CHL CUP STRESS STAGE 3 HR: 130 {beats}/min
CHL CUP STRESS STAGE 3 SPEED: 1.7 mph
CHL CUP STRESS STAGE 4 GRADE: 12 %
CHL CUP STRESS STAGE 4 HR: 139 {beats}/min
CHL CUP STRESS STAGE 4 SPEED: 2.5 mph
CHL CUP STRESS STAGE 6 GRADE: 0 %
CHL CUP STRESS STAGE 6 HR: 90 {beats}/min
CHL CUP STRESS STAGE 6 SPEED: 0 mph
CSEPED: 4 min
CSEPHR: 97 %
CSEPPMHR: 95 %
Estimated workload: 6.4 METS
Exercise duration (sec): 30 s
LV sys vol: 14 mL
LVDIAVOL: 54 mL (ref 46–106)
Peak HR: 139 {beats}/min
Rest HR: 68 {beats}/min
SRS: 15
Stage 1 Grade: 0 %
Stage 2 Grade: 0 %
Stage 2 HR: 89 {beats}/min
Stage 2 Speed: 0 mph
Stage 3 Grade: 10 %
Stage 5 Grade: 0 %
Stage 5 HR: 122 {beats}/min
Stage 5 Speed: 0 mph
Stage 6 DBP: 90 mmHg
Stage 6 SBP: 168 mmHg
TID: 0.91

## 2016-06-03 MED ORDER — TECHNETIUM TC 99M TETROFOSMIN IV KIT
32.5490 | PACK | Freq: Once | INTRAVENOUS | Status: AC | PRN
  Administered 2016-06-03: 32.549 via INTRAVENOUS

## 2016-06-03 MED ORDER — TECHNETIUM TC 99M TETROFOSMIN IV KIT
13.0000 | PACK | Freq: Once | INTRAVENOUS | Status: AC | PRN
  Administered 2016-06-03: 13.24 via INTRAVENOUS

## 2016-06-16 ENCOUNTER — Other Ambulatory Visit: Payer: Self-pay

## 2016-06-16 ENCOUNTER — Ambulatory Visit (INDEPENDENT_AMBULATORY_CARE_PROVIDER_SITE_OTHER): Payer: PPO

## 2016-06-16 DIAGNOSIS — R079 Chest pain, unspecified: Secondary | ICD-10-CM

## 2016-06-16 DIAGNOSIS — R9431 Abnormal electrocardiogram [ECG] [EKG]: Secondary | ICD-10-CM

## 2016-06-23 DIAGNOSIS — R809 Proteinuria, unspecified: Secondary | ICD-10-CM | POA: Diagnosis not present

## 2016-06-23 DIAGNOSIS — N2581 Secondary hyperparathyroidism of renal origin: Secondary | ICD-10-CM | POA: Diagnosis not present

## 2016-06-23 DIAGNOSIS — N183 Chronic kidney disease, stage 3 (moderate): Secondary | ICD-10-CM | POA: Diagnosis not present

## 2016-06-23 DIAGNOSIS — I10 Essential (primary) hypertension: Secondary | ICD-10-CM | POA: Diagnosis not present

## 2016-06-30 ENCOUNTER — Encounter: Payer: Self-pay | Admitting: Internal Medicine

## 2016-06-30 ENCOUNTER — Ambulatory Visit (INDEPENDENT_AMBULATORY_CARE_PROVIDER_SITE_OTHER): Payer: PPO | Admitting: Internal Medicine

## 2016-06-30 ENCOUNTER — Encounter: Payer: Self-pay | Admitting: Cardiology

## 2016-06-30 ENCOUNTER — Ambulatory Visit (INDEPENDENT_AMBULATORY_CARE_PROVIDER_SITE_OTHER): Payer: PPO | Admitting: Cardiology

## 2016-06-30 ENCOUNTER — Other Ambulatory Visit: Payer: Self-pay | Admitting: Gastroenterology

## 2016-06-30 VITALS — BP 142/62 | HR 82 | Ht 64.0 in | Wt 159.2 lb

## 2016-06-30 VITALS — BP 118/74 | HR 56 | Temp 98.6°F | Resp 12 | Ht 64.0 in | Wt 158.0 lb

## 2016-06-30 DIAGNOSIS — I10 Essential (primary) hypertension: Secondary | ICD-10-CM

## 2016-06-30 DIAGNOSIS — R1013 Epigastric pain: Secondary | ICD-10-CM | POA: Diagnosis not present

## 2016-06-30 DIAGNOSIS — R935 Abnormal findings on diagnostic imaging of other abdominal regions, including retroperitoneum: Secondary | ICD-10-CM | POA: Diagnosis not present

## 2016-06-30 DIAGNOSIS — E2839 Other primary ovarian failure: Secondary | ICD-10-CM | POA: Diagnosis not present

## 2016-06-30 DIAGNOSIS — E784 Other hyperlipidemia: Secondary | ICD-10-CM | POA: Diagnosis not present

## 2016-06-30 DIAGNOSIS — I350 Nonrheumatic aortic (valve) stenosis: Secondary | ICD-10-CM | POA: Diagnosis not present

## 2016-06-30 DIAGNOSIS — E7849 Other hyperlipidemia: Secondary | ICD-10-CM

## 2016-06-30 DIAGNOSIS — N189 Chronic kidney disease, unspecified: Secondary | ICD-10-CM

## 2016-06-30 DIAGNOSIS — K219 Gastro-esophageal reflux disease without esophagitis: Secondary | ICD-10-CM | POA: Diagnosis not present

## 2016-06-30 DIAGNOSIS — D509 Iron deficiency anemia, unspecified: Secondary | ICD-10-CM

## 2016-06-30 DIAGNOSIS — E7800 Pure hypercholesterolemia, unspecified: Secondary | ICD-10-CM

## 2016-06-30 DIAGNOSIS — E039 Hypothyroidism, unspecified: Secondary | ICD-10-CM

## 2016-06-30 LAB — HEPATIC FUNCTION PANEL
ALK PHOS: 103 U/L (ref 39–117)
ALT: 18 U/L (ref 0–35)
AST: 22 U/L (ref 0–37)
Albumin: 4 g/dL (ref 3.5–5.2)
BILIRUBIN TOTAL: 0.6 mg/dL (ref 0.2–1.2)
Bilirubin, Direct: 0.1 mg/dL (ref 0.0–0.3)
Total Protein: 7.3 g/dL (ref 6.0–8.3)

## 2016-06-30 LAB — CBC WITH DIFFERENTIAL/PLATELET
BASOS ABS: 0 10*3/uL (ref 0.0–0.1)
Basophils Relative: 0.6 % (ref 0.0–3.0)
EOS ABS: 0.1 10*3/uL (ref 0.0–0.7)
Eosinophils Relative: 2.2 % (ref 0.0–5.0)
HCT: 36.4 % (ref 36.0–46.0)
Hemoglobin: 12.2 g/dL (ref 12.0–15.0)
Lymphocytes Relative: 36.9 % (ref 12.0–46.0)
Lymphs Abs: 2.1 10*3/uL (ref 0.7–4.0)
MCHC: 33.5 g/dL (ref 30.0–36.0)
MCV: 86.9 fl (ref 78.0–100.0)
MONO ABS: 0.6 10*3/uL (ref 0.1–1.0)
Monocytes Relative: 11.1 % (ref 3.0–12.0)
Neutro Abs: 2.8 10*3/uL (ref 1.4–7.7)
Neutrophils Relative %: 49.2 % (ref 43.0–77.0)
PLATELETS: 213 10*3/uL (ref 150.0–400.0)
RBC: 4.19 Mil/uL (ref 3.87–5.11)
RDW: 13.9 % (ref 11.5–15.5)
WBC: 5.7 10*3/uL (ref 4.0–10.5)

## 2016-06-30 LAB — LIPID PANEL
Cholesterol: 211 mg/dL — ABNORMAL HIGH (ref 0–200)
HDL: 42.7 mg/dL (ref >39.00)
LDL CALC: 132 mg/dL — AB (ref 0–99)
NONHDL: 168.41
Total CHOL/HDL Ratio: 5
Triglycerides: 182 mg/dL — ABNORMAL HIGH (ref 0.0–149.0)
VLDL: 36.4 mg/dL (ref 0.0–40.0)

## 2016-06-30 LAB — BASIC METABOLIC PANEL
BUN: 35 mg/dL — ABNORMAL HIGH (ref 6–23)
CALCIUM: 9.6 mg/dL (ref 8.4–10.5)
CO2: 28 meq/L (ref 19–32)
CREATININE: 1.63 mg/dL — AB (ref 0.40–1.20)
Chloride: 105 mEq/L (ref 96–112)
GFR: 32.67 mL/min — AB (ref >60.00)
GLUCOSE: 100 mg/dL — AB (ref 70–99)
Potassium: 5 mEq/L (ref 3.5–5.1)
SODIUM: 138 meq/L (ref 135–145)

## 2016-06-30 MED ORDER — LOSARTAN POTASSIUM 50 MG PO TABS: 50.0000 mg | ORAL_TABLET | Freq: Every day | ORAL | 1 refills | Status: DC

## 2016-06-30 NOTE — Progress Notes (Signed)
Patient ID: PAMILA MENDIBLES, female   DOB: April 29, 1940, 76 y.o.   MRN: [id number]   Subjective:    Patient ID: [name], female    DOB: 01/01/1941, 76 y.o.   MRN: [id number]  HPI  Patient here for a scheduled follow up.  Followed by Dr Holley Raring for CKD.  Was seen 06/23/16.  Kidney function stable with most recent GFR 34.  On losartan '50mg'$  and blood pressure has been better.  Was having epigastric and chest pain last visit.  Saw cardiology.  Cardiac w/up unrevealing for ischemia.  She is due f/u with Dr Loel Dubonnet today.  Last visit, increased her PPI to bid.  This has helped.  She did have an episode last night (after eating onions) and noticed increased indigestion and belching.  If she turned over on her right side, some nausea.  Overall episodes have been better.  No abdominal pain now.  No nausea or vomiting.  Bowels move q 2-3 days.     Past Medical History:  Diagnosis Date  . Chronic kidney disease    Followed by Dr. Holley Raring  . Melanoma of lower leg ([place]) 2013  . Shingles 2013  . Spinal stenosis in cervical region    Dr. Sharlet Salina   Past Surgical History:  Procedure Laterality Date  . ABDOMINAL HYSTERECTOMY  1974  . BREAST BIOPSY  1963   Benign per pt's report  . LUNG BIOPSY  2007   Benign per pt's report   Family History  Problem Relation Age of Onset  . Arthritis Mother   . Liver disease Mother   . Cirrhosis Mother   . Heart disease Father   . Heart attack Father   . Depression Brother   . Breast cancer Other   . Breast cancer Cousin     maternal cousins   Social History   Social History  . Marital status: Married    Spouse name: N/A  . Number of children: 2  . Years of education: N/A   Occupational History  . Retired    Social History Main Topics  . Smoking status: Never Smoker  . Smokeless tobacco: Never Used  . Alcohol use No  . Drug use: No  . Sexual activity: Yes   Other Topics Concern  . None   Social History Narrative   Lives in [place] with  husband. Has 2 children boy and girl, 4 grandchildren. Retired Presenter, broadcasting.       Daily Caffeine Use:  NO   Regular Exercise -  Not at this time due to back pain, would like to resume soon    Outpatient Encounter Prescriptions as of 06/30/2016  Medication Sig  . amLODipine (NORVASC) 2.5 MG tablet Take 1 tablet (2.5 mg total) by mouth daily.  Marland Kitchen aspirin 81 MG tablet Take 1 tablet (81 mg total) by mouth daily.  . cholecalciferol (VITAMIN D) 1000 units tablet Take 1 tablet (1,000 Units total) by mouth 2 (two) times daily. (Patient taking differently: Take 2,000 Units by mouth daily. )  . CRANBERRY PO Take 3,600 mg by mouth daily.    . DULoxetine (CYMBALTA) 60 MG capsule Take 1 capsule (60 mg total) by mouth daily.  . fexofenadine-pseudoephedrine (ALLEGRA-D 24) 180-240 MG 24 hr tablet Take 1 tablet by mouth daily as needed.  Marland Kitchen glucosamine-chondroitin 500-400 MG tablet Take 1 tablet by mouth 3 (three) times daily.  Marland Kitchen levothyroxine (SYNTHROID, LEVOTHROID) 75 MCG tablet TAKE 1 TABLET EVERY DAY BEFORE BREAKFAST  . losartan (COZAAR)  50 MG tablet Take 1 tablet (50 mg total) by mouth daily.  . Multiple Vitamins-Minerals (CENTRUM SILVER ULTRA WOMENS) TABS Take 1 tablet by mouth daily.    . Omega-3 Fatty Acids (FISH OIL) 1000 MG CAPS Take 1,000 mg by mouth daily.  Marland Kitchen omeprazole (PRILOSEC) 20 MG capsule Take 1 capsule (20 mg total) by mouth 2 (two) times daily before a meal.  . [DISCONTINUED] atorvastatin (LIPITOR) 10 MG tablet TAKE 1 TABLET EVERY DAY  AT  6PM  . [DISCONTINUED] losartan (COZAAR) 25 MG tablet Take 1 tablet (25 mg total) by mouth daily. (Patient taking differently: Take 50 mg by mouth daily. )  . [DISCONTINUED] losartan (COZAAR) 50 MG tablet Take 50 mg by mouth daily.   No facility-administered encounter medications on file as of 06/30/2016.     Review of Systems  Constitutional: Negative for appetite change and unexpected weight change.  HENT: Negative for congestion and sinus pressure.     Respiratory: Negative for cough, chest tightness and shortness of breath.   Cardiovascular: Negative for chest pain, palpitations and leg swelling.  Gastrointestinal: Positive for nausea. Negative for vomiting.       Previous pain.   Genitourinary: Negative for difficulty urinating and dysuria.  Musculoskeletal: Negative for joint swelling and myalgias.  Skin: Negative for color change and rash.  Neurological: Negative for dizziness, light-headedness and headaches.  Psychiatric/Behavioral: Negative for agitation and dysphoric mood.       Objective:    Physical Exam  Constitutional: She appears well-developed and well-nourished. No distress.  HENT:  Nose: Nose normal.  Mouth/Throat: Oropharynx is clear and moist.  Neck: Neck supple. No thyromegaly present.  Cardiovascular: Normal rate and regular rhythm.   Pulmonary/Chest: Breath sounds normal. No respiratory distress. She has no wheezes.  Abdominal: Soft. Bowel sounds are normal. There is no tenderness.  Musculoskeletal: She exhibits no edema or tenderness.  Lymphadenopathy:    She has no cervical adenopathy.  Skin: No rash noted. No erythema.  Psychiatric: She has a normal mood and affect. Her behavior is normal.    BP 118/74 (BP Location: Left Arm, Patient Position: Sitting, Cuff Size: Normal)   Pulse (!) 56   Temp 98.6 F (37 C) (Oral)   Resp 12   Ht '5\' 4"'$  (1.626 m)   Wt 158 lb (71.7 kg)   SpO2 96%   BMI 27.12 kg/m  Wt Readings from Last 3 Encounters:  06/30/16 159 lb 4 oz (72.2 kg)  06/30/16 158 lb (71.7 kg)  05/20/16 155 lb 8 oz (70.5 kg)     Lab Results  Component Value Date   WBC 5.7 06/30/2016   HGB 12.2 06/30/2016   HCT 36.4 06/30/2016   PLT 213.0 06/30/2016   GLUCOSE 100 (H) 06/30/2016   CHOL 211 (H) 06/30/2016   TRIG 182.0 (H) 06/30/2016   HDL 42.70 06/30/2016   LDLCALC 132 (H) 06/30/2016   ALT 18 06/30/2016   AST 22 06/30/2016   NA 138 06/30/2016   K 5.0 06/30/2016   CL 105 06/30/2016    CREATININE 1.63 (H) 06/30/2016   BUN 35 (H) 06/30/2016   CO2 28 06/30/2016   TSH 1.74 03/02/2016   MICROALBUR 2.1 (H) 03/27/2015    Nm Myocar Multi W/spect W/wall Motion / Ef  Result Date: 06/03/2016 Exercise myocardial perfusion imaging study with no significant  Ischemia Small region of fixed perfusion defect in the apical region consistent with attenuation artifact,  though small region of scar can not be excluded. Normal  wall motion, EF estimated at 62% No EKG changes concerning for ischemia at peak stress or in recovery. Target heart rate achieved, exercise time of 4:30, 6.4 METS Hypertensive with exercise, peak 190/88 in recovery Low risk scan Signed, Esmond Plants, MD, Ph.D [facility]       Assessment & Plan:   Problem List Items Addressed This Visit    Anemia, iron deficiency    Follow cbc.  History of anemia.       Relevant Orders   CBC with Differential/Platelet (Completed)   Chronic kidney disease    Followed by nephrology.  Stable as outlined.        GERD (gastroesophageal reflux disease)    On omeprazole.  Feels doing better.  Follow.       Hypercholesterolemia    On lipitor.  Low cholesterol diet and exercise.  Follow lipid panel and liver function tests.        Relevant Medications   losartan (COZAAR) 50 MG tablet   Other Relevant Orders   Hepatic function panel (Completed)   Lipid panel (Completed)   Hypertension (Chronic)    Blood pressure as outlined.  Same medication regimen.  Follow pressures.  Follow metabolic panel.        Relevant Medications   losartan (COZAAR) 50 MG tablet   Other Relevant Orders   Basic metabolic panel (Completed)   Hypothyroidism (Chronic)    On thyroid supplement.  Follow tsh.        Other Visit Diagnoses    Estrogen deficiency    -  Primary   Relevant Orders   DG Bone Density       Einar Pheasant, MD

## 2016-06-30 NOTE — Patient Instructions (Addendum)
Testing/Procedures: Your physician has requested that you have an echocardiogram in 2 years. Echocardiography is a painless test that uses sound waves to create images of your heart. It provides your doctor with information about the size and shape of your heart and how well your heart's chambers and valves are working. This procedure takes approximately one hour. There are no restrictions for this procedure.    Follow-Up: Your physician wants you to follow-up in: 3 months. You will receive a reminder letter in the mail two months in advance. If you don't receive a letter, please call our office to schedule the follow-up appointment.  It was a pleasure seeing you today here in the office. Please do not hesitate to give [place] a call back if you have any further questions. [place], BSN  '  Echocardiogram An echocardiogram, or echocardiography, uses sound waves (ultrasound) to produce an image of your heart. The echocardiogram is simple, painless, obtained within a short period of time, and offers valuable information to your health care provider. The images from an echocardiogram can provide information such as:  Evidence of coronary artery disease (CAD).  Heart size.  Heart muscle function.  Heart valve function.  Aneurysm detection.  Evidence of a past heart attack.  Fluid buildup around the heart.  Heart muscle thickening.  Assess heart valve function. Tell a health care provider about:  Any allergies you have.  All medicines you are taking, including vitamins, herbs, eye drops, creams, and over-the-counter medicines.  Any problems you or family members have had with anesthetic medicines.  Any blood disorders you have.  Any surgeries you have had.  Any medical conditions you have.  Whether you are pregnant or may be pregnant. What happens before the procedure? No special preparation is needed. Eat and drink normally. What happens during the  procedure?  In order to produce an image of your heart, gel will be applied to your chest and a wand-like tool (transducer) will be moved over your chest. The gel will help transmit the sound waves from the transducer. The sound waves will harmlessly bounce off your heart to allow the heart images to be captured in real-time motion. These images will then be recorded.  You may need an IV to receive a medicine that improves the quality of the pictures. What happens after the procedure? You may return to your normal schedule including diet, activities, and medicines, unless your health care provider tells you otherwise. This information is not intended to replace advice given to you by your health care provider. Make sure you discuss any questions you have with your health care provider. Document Released: 02/28/2000 Document Revised: 10/19/2015 Document Reviewed: 11/07/2012 Elsevier Interactive Patient Education  2017 [employer].

## 2016-06-30 NOTE — Progress Notes (Signed)
Pre-visit discussion using our clinic review tool. No additional management support is needed unless otherwise documented below in the visit note.  

## 2016-06-30 NOTE — Progress Notes (Signed)
Cardiology Office Note   Date:  06/30/2016   ID:  Caitlin Lin, DOB 1940/11/01, MRN [id number]  Referring Doctor:  Einar Pheasant, MD   Cardiologist:   Wende Bushy, MD   Reason for consultation:  Chief Complaint  Patient presents with  . OTHER    F/u echo. Meds reviewed verbally with pt.      History of Present Illness: Caitlin Lin is a 76 y.o. female who presents forFollow-up after testing  Since last visit, no recurrence of chest pain. No shortness of breath.  No shortness of breath, PND, orthopnea, edema.  ROS:  Please see the history of present illness. Aside from mentioned under HPI, all other systems are reviewed and negative.    Past Medical History:  Diagnosis Date  . Chronic kidney disease    Followed by Dr. Holley Raring  . Melanoma of lower leg ([place]) 2013  . Shingles 2013  . Spinal stenosis in cervical region    Dr. Sharlet Salina    Past Surgical History:  Procedure Laterality Date  . ABDOMINAL HYSTERECTOMY  1974  . BREAST BIOPSY  1963   Benign per pt's report  . LUNG BIOPSY  2007   Benign per pt's report     reports that she has never smoked. She has never used smokeless tobacco. She reports that she does not drink alcohol or use drugs.   family history includes Arthritis in her mother; Breast cancer in her cousin and other; Cirrhosis in her mother; Depression in her brother; Heart attack in her father; Heart disease in her father; Liver disease in her mother.   Outpatient Medications Prior to Visit  Medication Sig Dispense Refill  . amLODipine (NORVASC) 2.5 MG tablet Take 1 tablet (2.5 mg total) by mouth daily. 90 tablet 3  . aspirin 81 MG tablet Take 1 tablet (81 mg total) by mouth daily. 30 tablet 3  . atorvastatin (LIPITOR) 10 MG tablet TAKE 1 TABLET EVERY DAY  AT  6PM 90 tablet 3  . cholecalciferol (VITAMIN D) 1000 units tablet Take 1 tablet (1,000 Units total) by mouth 2 (two) times daily. (Patient taking differently: Take 2,000 Units by  mouth daily. ) 60 tablet 2  . CRANBERRY PO Take 3,600 mg by mouth daily.      . DULoxetine (CYMBALTA) 60 MG capsule Take 1 capsule (60 mg total) by mouth daily. 90 capsule 1  . fexofenadine-pseudoephedrine (ALLEGRA-D 24) 180-240 MG 24 hr tablet Take 1 tablet by mouth daily as needed. 90 tablet 3  . glucosamine-chondroitin 500-400 MG tablet Take 1 tablet by mouth 3 (three) times daily. 90 tablet 3  . levothyroxine (SYNTHROID, LEVOTHROID) 75 MCG tablet TAKE 1 TABLET EVERY DAY BEFORE BREAKFAST 90 tablet 3  . losartan (COZAAR) 50 MG tablet Take 1 tablet (50 mg total) by mouth daily. 90 tablet 1  . Multiple Vitamins-Minerals (CENTRUM SILVER ULTRA WOMENS) TABS Take 1 tablet by mouth daily.      . Omega-3 Fatty Acids (FISH OIL) 1000 MG CAPS Take 1,000 mg by mouth daily.    Marland Kitchen omeprazole (PRILOSEC) 20 MG capsule Take 1 capsule (20 mg total) by mouth 2 (two) times daily before a meal. 180 capsule 1   No facility-administered medications prior to visit.      Allergies: Quinapril and Codeine    PHYSICAL EXAM: VS:  BP (!) 142/62 (BP Location: Left Arm, Patient Position: Sitting, Cuff Size: Normal)   Pulse 82   Ht '5\' 4"'$  (1.626 m)  Wt 159 lb 4 oz (72.2 kg)   BMI 27.34 kg/m  , Body mass index is 27.34 kg/m. Wt Readings from Last 3 Encounters:  06/30/16 159 lb 4 oz (72.2 kg)  06/30/16 158 lb (71.7 kg)  05/20/16 155 lb 8 oz (70.5 kg)    GENERAL:  well developed, well nourished, obese, not in acute distress HEENT: normocephalic, pink conjunctivae, anicteric sclerae, no xanthelasma, normal dentition, oropharynx clear NECK:  no neck vein engorgement, JVP normal, no hepatojugular reflux, carotid upstroke brisk and symmetric, no bruit, no thyromegaly, no lymphadenopathy LUNGS:  good respiratory effort, clear to auscultation bilaterally CV:  PMI not displaced, no thrills, no lifts, S1 and S2 within normal limits, no palpable S3 or S4, no murmurs, no rubs, no gallops ABD:  Soft, nontender, nondistended,  normoactive bowel sounds, no abdominal aortic bruit, no hepatomegaly, no splenomegaly MS: nontender back, no kyphosis, no scoliosis, no joint deformities EXT:  2+ DP/PT pulses, no edema, no varicosities, no cyanosis, no clubbing SKIN: warm, nondiaphoretic, normal turgor, no ulcers NEUROPSYCH: alert, oriented to person, place, and time, sensory/motor grossly intact, normal mood, appropriate affect   Recent Labs: 03/02/2016: TSH 1.74 06/30/2016: ALT 18; BUN 35; Creatinine, Ser 1.63; Hemoglobin 12.2; Platelets 213.0; Potassium 5.0; Sodium 138   Lipid Panel    Component Value Date/Time   CHOL 211 (H) 06/30/2016 1045   TRIG 182.0 (H) 06/30/2016 1045   HDL 42.70 06/30/2016 1045   CHOLHDL 5 06/30/2016 1045   VLDL 36.4 06/30/2016 1045   LDLCALC 132 (H) 06/30/2016 1045     Other studies Reviewed:  EKG:  The ekg from 05/20/2016 was personally reviewed by me and it revealed sinus rhythm, occasional PVCs, 87 BPM. Low-voltage QRS. Nonspecific ST-T wave changes. Reviewed EKG from 05/12/2016 personally and that showed some Q waves in the inferior leads, abnormal EKG.  Additional studies/ records that were reviewed personally reviewed by me today include:  Echo 06/16/2016: Left ventricle: The cavity size was normal. Systolic function was   normal. The estimated ejection fraction was in the range of 60%   to 65%. Wall motion was normal; there were no regional wall   motion abnormalities. Doppler parameters are consistent with   abnormal left ventricular relaxation (grade 1 diastolic   dysfunction). - Aortic valve: There was mild regurgitation. - Mitral valve: Calcified annulus. There was mild regurgitation. - Left atrium: The atrium was normal in size. - Right ventricle: Systolic function was normal. - Pulmonary arteries: Systolic pressure was within the normal   range.  Impressions:  - Normal study.  Nuclear stress is 06/03/2016: Exercise myocardial perfusion imaging study with no  significant  Ischemia Small region of fixed perfusion defect in the apical region consistent with attenuation artifact,  though small region of scar can not be excluded.  Normal wall motion, EF estimated at 62% No EKG changes concerning for ischemia at peak stress or in recovery. Target heart rate achieved, exercise time of 4:30, 6.4 METS Hypertensive with exercise, peak 190/88 in recovery Low risk scan   ASSESSMENT AND PLAN: Chest pain Risk factors for CAD include age, hypertension, hyperlipidemia, family history of possibly premature CAD. Father likely developed heart disease and suffered 2 heart attacks in his 89s. No ischemia on stress test. LVEF normal. Findings discussed with patient. Glycated of clinically significant CAD is low. Patient reassured. Continue risk factor modification.  Aortic regurgitation Mild. Likely a function of blood pressure. Recommend follow-up in 2 years with echo.  HTN BP is well controlled.  Continue monitoring BP. Continue current medical therapy and lifestyle changes.  Hyperlipidemia Patient on statin, PCP following labs.   Current medicines are reviewed at length with the patient today.  The patient does not have concerns regarding medicines.  Labs/ tests ordered today include:  Orders Placed This Encounter  Procedures  . ECHOCARDIOGRAM COMPLETE    I had a lengthy and detailed discussion with the patient regarding diagnoses, prognosis, diagnostic options.  I counseled the patient on importance of lifestyle modification including heart healthy diet, regular physical activity.  Disposition:   FU with Cardiology after tests   I spent at least 25 minutes with the patient today and more than 50% of the time was spent counseling the patient and coordinating care.       Signed, Wende Bushy, MD  06/30/2016 5:20 PM    [place]  This note was generated in part with voice recognition software and I apologize for any  typographical errors that were not detected and corrected.

## 2016-07-01 ENCOUNTER — Encounter: Payer: Self-pay | Admitting: Internal Medicine

## 2016-07-01 ENCOUNTER — Other Ambulatory Visit: Payer: Self-pay

## 2016-07-01 DIAGNOSIS — I351 Nonrheumatic aortic (valve) insufficiency: Secondary | ICD-10-CM | POA: Insufficient documentation

## 2016-07-01 MED ORDER — ATORVASTATIN CALCIUM 20 MG PO TABS: 20.0000 mg | ORAL_TABLET | Freq: Every day | ORAL | 0 refills | Status: DC

## 2016-07-03 ENCOUNTER — Ambulatory Visit
Admission: RE | Admit: 2016-07-03 | Discharge: 2016-07-03 | Disposition: A | Discharge status: Home / Self Care | Payer: PPO | Source: Ambulatory Visit | Attending: Gastroenterology | Admitting: Gastroenterology

## 2016-07-03 DIAGNOSIS — R935 Abnormal findings on diagnostic imaging of other abdominal regions, including retroperitoneum: Secondary | ICD-10-CM

## 2016-07-06 ENCOUNTER — Ambulatory Visit
Admission: RE | Admit: 2016-07-06 | Discharge: 2016-07-06 | Disposition: A | Discharge status: Home / Self Care | Payer: PPO | Source: Ambulatory Visit | Attending: Gastroenterology | Admitting: Gastroenterology

## 2016-07-06 DIAGNOSIS — R935 Abnormal findings on diagnostic imaging of other abdominal regions, including retroperitoneum: Secondary | ICD-10-CM | POA: Diagnosis not present

## 2016-07-06 DIAGNOSIS — K769 Liver disease, unspecified: Secondary | ICD-10-CM | POA: Diagnosis not present

## 2016-07-06 NOTE — Assessment & Plan Note (Signed)
Blood pressure as outlined.  Same medication regimen.  Follow pressures.  Follow metabolic panel.  

## 2016-07-06 NOTE — Assessment & Plan Note (Signed)
Followed by nephrology.  Stable as outlined.

## 2016-07-06 NOTE — Assessment & Plan Note (Signed)
Follow cbc.  History of anemia.

## 2016-07-06 NOTE — Assessment & Plan Note (Signed)
On thyroid supplement.  Follow tsh.

## 2016-07-06 NOTE — Assessment & Plan Note (Signed)
On omeprazole.  Feels doing better.  Follow.

## 2016-07-06 NOTE — Assessment & Plan Note (Signed)
On lipitor.  Low cholesterol diet and exercise.  Follow lipid panel and liver function tests.   

## 2016-07-08 DIAGNOSIS — K7460 Unspecified cirrhosis of liver: Secondary | ICD-10-CM | POA: Diagnosis not present

## 2016-07-13 ENCOUNTER — Telehealth: Payer: Self-pay

## 2016-07-13 DIAGNOSIS — I10 Essential (primary) hypertension: Secondary | ICD-10-CM | POA: Diagnosis not present

## 2016-07-13 DIAGNOSIS — H8309 Labyrinthitis, unspecified ear: Secondary | ICD-10-CM | POA: Diagnosis not present

## 2016-07-13 NOTE — Telephone Encounter (Signed)
Reason for call:vertigo, nausea Symptoms: nauseous, dizziness, left ear pain keeping fluids down, able to keep toast down,  Duration symptoms present since yesterday Medications:allegra, nasal saline   Last seen for this problem: Seen by: Referred  [facility] advised to follow up with [place] after seeing urgent care .  Husband will drive her.

## 2016-07-15 ENCOUNTER — Telehealth: Payer: Self-pay | Admitting: *Deleted

## 2016-07-15 DIAGNOSIS — R42 Dizziness and giddiness: Secondary | ICD-10-CM

## 2016-07-15 NOTE — Telephone Encounter (Signed)
Reason for call:dizziness , sinus Symptoms: diagnosed with inner ear at [facility] in Clinic, runny nose, achy, neck across shoulders,no chest pain, still dizzy,especially upon standing, nauseous,  headache across forehead, comes down nose, BP was elevated,  Took BP yesterday 140/84 Duration; Sunday Medications:Antivert Last seen for this problem: 07/13/16 Seen by: [facility] Only wants to see Dr Nicki Reaper, Patient aware your out of office this pm, advised if symptoms worsen to seek treatment at walk in or ED.

## 2016-07-15 NOTE — Telephone Encounter (Signed)
Patient advised of below , she is willing to see ENT she would like for you to go ahead and place order for ENT. Thanks.

## 2016-07-15 NOTE — Telephone Encounter (Signed)
Reviewed acute care note.  Symptoms reviewed.  Diagnosed with inner ear.  Given her persistent symptoms, it sounds like she needs to see ENT for evaluation and question of epley maneuvers.  This will determine if definitely inner ear and then they can do the maneuvers to treat the dizziness.  I can place the order for the referral.  This would save her two appts.

## 2016-07-15 NOTE — Telephone Encounter (Signed)
Patient was seen at [facility] clinic on yesterday for dizziness and sinus issues. Pt continues with symptoms and requested to be seen by Dr. Nicki Reaper only. Pt contact [phone]

## 2016-07-16 NOTE — Telephone Encounter (Signed)
Order placed for ENT referral.  Can we see if we can get an appt asap.  Thanks

## 2016-07-21 DIAGNOSIS — J301 Allergic rhinitis due to pollen: Secondary | ICD-10-CM | POA: Diagnosis not present

## 2016-07-21 DIAGNOSIS — R42 Dizziness and giddiness: Secondary | ICD-10-CM | POA: Diagnosis not present

## 2016-08-03 ENCOUNTER — Telehealth: Payer: Self-pay | Admitting: *Deleted

## 2016-08-03 NOTE — Telephone Encounter (Signed)
Medication Refill requested for :omeprazole  Pharmacy:[facility] [address]  Return Contact : [phone]

## 2016-08-04 ENCOUNTER — Other Ambulatory Visit: Payer: Self-pay

## 2016-08-04 MED ORDER — OMEPRAZOLE 20 MG PO CPDR: 20.0000 mg | DELAYED_RELEASE_CAPSULE | Freq: Two times a day (BID) | ORAL | 1 refills | Status: DC

## 2016-08-04 NOTE — Telephone Encounter (Signed)
done

## 2016-08-11 ENCOUNTER — Other Ambulatory Visit: Payer: Self-pay

## 2016-08-11 DIAGNOSIS — R768 Other specified abnormal immunological findings in serum: Secondary | ICD-10-CM | POA: Diagnosis not present

## 2016-08-11 DIAGNOSIS — M25561 Pain in right knee: Secondary | ICD-10-CM | POA: Diagnosis not present

## 2016-08-11 DIAGNOSIS — M25562 Pain in left knee: Secondary | ICD-10-CM | POA: Diagnosis not present

## 2016-08-11 DIAGNOSIS — N183 Chronic kidney disease, stage 3 (moderate): Secondary | ICD-10-CM | POA: Diagnosis not present

## 2016-08-11 DIAGNOSIS — G8929 Other chronic pain: Secondary | ICD-10-CM | POA: Diagnosis not present

## 2016-08-11 MED ORDER — OMEPRAZOLE 20 MG PO CPDR: 20.0000 mg | DELAYED_RELEASE_CAPSULE | Freq: Two times a day (BID) | ORAL | 1 refills | Status: DC

## 2016-08-11 NOTE — Telephone Encounter (Signed)
Resent

## 2016-08-11 NOTE — Progress Notes (Unsigned)
Pre-visit discussion using our clinic review tool. No additional management support is needed unless otherwise documented below in the visit note.  

## 2016-08-11 NOTE — Telephone Encounter (Signed)
PT called back and stated that the pharmacy does not have this rx. Please advise, thank you!

## 2016-08-17 DIAGNOSIS — R768 Other specified abnormal immunological findings in serum: Secondary | ICD-10-CM | POA: Diagnosis not present

## 2016-08-17 DIAGNOSIS — R1013 Epigastric pain: Secondary | ICD-10-CM | POA: Diagnosis not present

## 2016-08-17 DIAGNOSIS — K7460 Unspecified cirrhosis of liver: Secondary | ICD-10-CM | POA: Diagnosis not present

## 2016-09-24 DIAGNOSIS — Z961 Presence of intraocular lens: Secondary | ICD-10-CM | POA: Diagnosis not present

## 2016-10-06 ENCOUNTER — Ambulatory Visit (INDEPENDENT_AMBULATORY_CARE_PROVIDER_SITE_OTHER): Payer: PPO | Admitting: Internal Medicine

## 2016-10-06 ENCOUNTER — Encounter (INDEPENDENT_AMBULATORY_CARE_PROVIDER_SITE_OTHER): Payer: Self-pay

## 2016-10-06 ENCOUNTER — Encounter: Payer: Self-pay | Admitting: Internal Medicine

## 2016-10-06 DIAGNOSIS — D509 Iron deficiency anemia, unspecified: Secondary | ICD-10-CM | POA: Diagnosis not present

## 2016-10-06 DIAGNOSIS — N189 Chronic kidney disease, unspecified: Secondary | ICD-10-CM | POA: Diagnosis not present

## 2016-10-06 DIAGNOSIS — E039 Hypothyroidism, unspecified: Secondary | ICD-10-CM

## 2016-10-06 DIAGNOSIS — I10 Essential (primary) hypertension: Secondary | ICD-10-CM | POA: Diagnosis not present

## 2016-10-06 DIAGNOSIS — E7800 Pure hypercholesterolemia, unspecified: Secondary | ICD-10-CM | POA: Diagnosis not present

## 2016-10-06 NOTE — Progress Notes (Signed)
Patient ID: Caitlin Lin, female   DOB: [birth year], 76 y.o.   MRN: [id number]   Subjective:    Patient ID: Caitlin Lin, female    DOB: 08-Nov-1940, 76 y.o.   MRN: [id number]  HPI  Patient here for a scheduled follow up.  She saw GI.  Had epigastric pain.  Note reviewed.  Was placed on reglan.  Has EGD planned 10/2016.  Pain has resolved. Avoids eating fried foods.  Eating and drinking.  No nausea or vomiting.  Bowels stable.     Past Medical History:  Diagnosis Date  . Chronic kidney disease    Followed by Dr. Holley Raring  . Melanoma of lower leg ([place]) 2013  . Shingles 2013  . Spinal stenosis in cervical region    Dr. Sharlet Salina   Past Surgical History:  Procedure Laterality Date  . ABDOMINAL HYSTERECTOMY  1974  . BREAST BIOPSY  1963   Benign per pt's report  . LUNG BIOPSY  2007   Benign per pt's report   Family History  Problem Relation Age of Onset  . Arthritis Mother   . Liver disease Mother   . Cirrhosis Mother   . Heart disease Father   . Heart attack Father   . Depression Brother   . Breast cancer Other   . Breast cancer Cousin        maternal cousins   Social History   Social History  . Marital status: Married    Spouse name: N/A  . Number of children: 2  . Years of education: N/A   Occupational History  . Retired    Social History Main Topics  . Smoking status: Never Smoker  . Smokeless tobacco: Never Used  . Alcohol use No  . Drug use: No  . Sexual activity: Yes   Other Topics Concern  . None   Social History Narrative   Lives in [place] with husband. Has 2 children boy and girl, 4 grandchildren. Retired Presenter, broadcasting.       Daily Caffeine Use:  NO   Regular Exercise -  Not at this time due to back pain, would like to resume soon    Outpatient Encounter Prescriptions as of 10/06/2016  Medication Sig  . amLODipine (NORVASC) 2.5 MG tablet Take 1 tablet (2.5 mg total) by mouth daily.  Marland Kitchen aspirin 81 MG tablet Take 1 tablet (81 mg total) by mouth  daily.  Marland Kitchen atorvastatin (LIPITOR) 20 MG tablet Take 1 tablet (20 mg total) by mouth daily.  . cholecalciferol (VITAMIN D) 1000 units tablet Take 1 tablet (1,000 Units total) by mouth 2 (two) times daily. (Patient taking differently: Take 2,000 Units by mouth daily. )  . CRANBERRY PO Take 3,600 mg by mouth daily.    . DULoxetine (CYMBALTA) 60 MG capsule Take 1 capsule (60 mg total) by mouth daily.  . fexofenadine-pseudoephedrine (ALLEGRA-D 24) 180-240 MG 24 hr tablet Take 1 tablet by mouth daily as needed.  Marland Kitchen glucosamine-chondroitin 500-400 MG tablet Take 1 tablet by mouth 3 (three) times daily.  Marland Kitchen levothyroxine (SYNTHROID, LEVOTHROID) 75 MCG tablet TAKE 1 TABLET EVERY DAY BEFORE BREAKFAST  . losartan (COZAAR) 50 MG tablet Take 1 tablet (50 mg total) by mouth daily.  . Multiple Vitamins-Minerals (CENTRUM SILVER ULTRA WOMENS) TABS Take 1 tablet by mouth daily.    . Omega-3 Fatty Acids (FISH OIL) 1000 MG CAPS Take 1,000 mg by mouth daily.  Marland Kitchen omeprazole (PRILOSEC) 20 MG capsule Take 1 capsule (20 mg total) by  mouth 2 (two) times daily before a meal.   No facility-administered encounter medications on file as of 10/06/2016.     Review of Systems  Constitutional: Negative for appetite change and unexpected weight change.  HENT: Negative for congestion and sinus pressure.   Respiratory: Negative for cough, chest tightness and shortness of breath.   Cardiovascular: Negative for chest pain, palpitations and leg swelling.  Gastrointestinal: Negative for abdominal pain, diarrhea, nausea and vomiting.  Genitourinary: Negative for difficulty urinating and dysuria.  Musculoskeletal: Negative for back pain and joint swelling.  Skin: Negative for color change and rash.  Neurological: Negative for dizziness, light-headedness and headaches.  Psychiatric/Behavioral: Negative for agitation and dysphoric mood.       Objective:     Blood pressure rechecked by me:  134/78  Physical Exam  Constitutional:  She appears well-developed and well-nourished. No distress.  HENT:  Nose: Nose normal.  Mouth/Throat: Oropharynx is clear and moist.  Neck: Neck supple. No thyromegaly present.  Cardiovascular: Normal rate and regular rhythm.   Pulmonary/Chest: Breath sounds normal. No respiratory distress. She has no wheezes.  Abdominal: Soft. Bowel sounds are normal. There is no tenderness.  Musculoskeletal: She exhibits no edema or tenderness.  Lymphadenopathy:    She has no cervical adenopathy.  Skin: No rash noted. No erythema.  Psychiatric: She has a normal mood and affect. Her behavior is normal.    BP 122/64 (BP Location: Left Arm, Patient Position: Sitting, Cuff Size: Normal)   Pulse 93   Temp 99 F (37.2 C) (Oral)   Resp 12   Ht 5\' 4"  (1.626 m)   Wt 156 lb 9.6 oz (71 kg)   SpO2 96%   BMI 26.88 kg/m  Wt Readings from Last 3 Encounters:  10/06/16 156 lb 9.6 oz (71 kg)  06/30/16 159 lb 4 oz (72.2 kg)  06/30/16 158 lb (71.7 kg)     Lab Results  Component Value Date   WBC 5.7 06/30/2016   HGB 12.2 06/30/2016   HCT 36.4 06/30/2016   PLT 213.0 06/30/2016   GLUCOSE 100 (H) 06/30/2016   CHOL 211 (H) 06/30/2016   TRIG 182.0 (H) 06/30/2016   HDL 42.70 06/30/2016   LDLCALC 132 (H) 06/30/2016   ALT 18 06/30/2016   AST 22 06/30/2016   NA 138 06/30/2016   K 5.0 06/30/2016   CL 105 06/30/2016   CREATININE 1.63 (H) 06/30/2016   BUN 35 (H) 06/30/2016   CO2 28 06/30/2016   TSH 1.74 03/02/2016   MICROALBUR 2.1 (H) 03/27/2015    US Liver Elastography W/o Img  Result Date: 07/06/2016 CLINICAL DATA:  Diffuse hepatocellular disease and possible hepatic cirrhosis on recent ultrasound. EXAM: ULTRASOUND HEPATIC ELASTOGRAPHY TECHNIQUE: Ultrasound elastography evaluation of the liver was performed. A region of interest was placed in the right lobe of the liver. Following application of a compressive sonographic pulse, shear waves were detected in the adjacent hepatic tissue and the shear wave  velocity was calculated. Multiple assessments were performed at the selected site. Median shear wave velocity is correlated to a Metavir fibrosis score. COMPARISON:  Abdomen ultrasound on 05/22/2016 FINDINGS: Device: Siemens Helix VTQ Patient position: Supine Transducer 6C1 Number of measurements: 10 Hepatic segment:  8 Median velocity:   4.45  m/sec IQR: 0.22 IQR/Median velocity ratio: 0.05 Corresponding Metavir fibrosis score:  Some F3 + F4 Risk of fibrosis: High Limitations of exam: None Pertinent findings noted on other imaging exams:  None Please note that abnormal shear wave velocities may  also be identified in clinical settings other than with hepatic fibrosis, such as: acute hepatitis, elevated right heart and central venous pressures including use of beta blockers, veno-occlusive disease (Budd-Chiari), infiltrative processes such as mastocytosis/amyloidosis/infiltrative tumor, extrahepatic cholestasis, in the post-prandial state, and liver transplantation. Correlation with patient history, laboratory data, and clinical condition recommended. IMPRESSION: Median hepatic shear wave velocity is calculated at 4.45 m/sec. Corresponding Metavir fibrosis score is  Some F3 + F4. Risk of fibrosis is High. Follow-up: Follow up advised Electronically Signed   By: Earle Gell M.D.   On: 07/06/2016 10:16       Assessment & Plan:   Problem List Items Addressed This Visit    Anemia, iron deficiency    History of anemia.  Follow cbc.        Chronic kidney disease    Followed by nephrology.        Hypercholesterolemia    On lipitor.  Follow lipid panel and liver function tests.        Relevant Orders   Hepatic function panel   Lipid panel   Hypertension (Chronic)    Blood pressure under good control.  Continue same medication regimen.  Follow pressures.  Follow metabolic panel.        Relevant Orders   Basic metabolic panel   Hypothyroidism (Chronic)    On thyroid replacement.  Follow tsh.             Einar Pheasant, MD

## 2016-10-06 NOTE — Progress Notes (Signed)
Pre-visit discussion using our clinic review tool. No additional management support is needed unless otherwise documented below in the visit note.  

## 2016-10-08 ENCOUNTER — Encounter: Payer: Self-pay | Admitting: Internal Medicine

## 2016-10-08 NOTE — Assessment & Plan Note (Signed)
On thyroid replacement.  Follow tsh.  

## 2016-10-08 NOTE — Assessment & Plan Note (Signed)
On lipitor.  Follow lipid panel and liver function tests.   

## 2016-10-08 NOTE — Assessment & Plan Note (Signed)
History of anemia.  Follow cbc.

## 2016-10-08 NOTE — Assessment & Plan Note (Signed)
Followed by nephrology. 

## 2016-10-08 NOTE — Assessment & Plan Note (Signed)
Blood pressure under good control.  Continue same medication regimen.  Follow pressures.  Follow metabolic panel.   

## 2016-10-16 DIAGNOSIS — N183 Chronic kidney disease, stage 3 (moderate): Secondary | ICD-10-CM | POA: Diagnosis not present

## 2016-10-16 DIAGNOSIS — I10 Essential (primary) hypertension: Secondary | ICD-10-CM | POA: Diagnosis not present

## 2016-10-16 DIAGNOSIS — R809 Proteinuria, unspecified: Secondary | ICD-10-CM | POA: Diagnosis not present

## 2016-10-16 DIAGNOSIS — N2581 Secondary hyperparathyroidism of renal origin: Secondary | ICD-10-CM | POA: Diagnosis not present

## 2016-10-20 ENCOUNTER — Ambulatory Visit: Payer: PPO

## 2016-10-28 ENCOUNTER — Ambulatory Visit
Admission: RE | Admit: 2016-10-28 | Discharge: 2016-10-28 | Disposition: A | Discharge status: Home / Self Care | Payer: PPO | Source: Ambulatory Visit | Attending: Internal Medicine | Admitting: Internal Medicine

## 2016-10-28 ENCOUNTER — Other Ambulatory Visit: Payer: Self-pay | Admitting: Internal Medicine

## 2016-10-28 DIAGNOSIS — M8589 Other specified disorders of bone density and structure, multiple sites: Secondary | ICD-10-CM | POA: Insufficient documentation

## 2016-10-28 DIAGNOSIS — Z1239 Encounter for other screening for malignant neoplasm of breast: Secondary | ICD-10-CM

## 2016-10-28 DIAGNOSIS — Z1231 Encounter for screening mammogram for malignant neoplasm of breast: Secondary | ICD-10-CM | POA: Insufficient documentation

## 2016-10-28 DIAGNOSIS — E2839 Other primary ovarian failure: Secondary | ICD-10-CM | POA: Diagnosis not present

## 2016-11-09 ENCOUNTER — Encounter: Payer: Self-pay | Admitting: *Deleted

## 2016-11-10 ENCOUNTER — Encounter: Payer: Self-pay | Admitting: *Deleted

## 2016-11-10 ENCOUNTER — Encounter
Admission: RE | Disposition: A | Discharge status: Home / Self Care | Payer: Self-pay | Source: Ambulatory Visit | Attending: Gastroenterology

## 2016-11-10 ENCOUNTER — Ambulatory Visit
Admission: RE | Admit: 2016-11-10 | Discharge: 2016-11-10 | Disposition: A | Discharge status: Home / Self Care | Payer: PPO | Source: Ambulatory Visit | Attending: Gastroenterology | Admitting: Gastroenterology

## 2016-11-10 ENCOUNTER — Ambulatory Visit: Payer: PPO | Admitting: Anesthesiology

## 2016-11-10 DIAGNOSIS — E785 Hyperlipidemia, unspecified: Secondary | ICD-10-CM | POA: Insufficient documentation

## 2016-11-10 DIAGNOSIS — Z85820 Personal history of malignant melanoma of skin: Secondary | ICD-10-CM | POA: Insufficient documentation

## 2016-11-10 DIAGNOSIS — Z888 Allergy status to other drugs, medicaments and biological substances status: Secondary | ICD-10-CM | POA: Diagnosis not present

## 2016-11-10 DIAGNOSIS — Z79899 Other long term (current) drug therapy: Secondary | ICD-10-CM | POA: Insufficient documentation

## 2016-11-10 DIAGNOSIS — K2960 Other gastritis without bleeding: Secondary | ICD-10-CM | POA: Diagnosis not present

## 2016-11-10 DIAGNOSIS — M4802 Spinal stenosis, cervical region: Secondary | ICD-10-CM | POA: Insufficient documentation

## 2016-11-10 DIAGNOSIS — Z885 Allergy status to narcotic agent status: Secondary | ICD-10-CM | POA: Insufficient documentation

## 2016-11-10 DIAGNOSIS — I129 Hypertensive chronic kidney disease with stage 1 through stage 4 chronic kidney disease, or unspecified chronic kidney disease: Secondary | ICD-10-CM | POA: Insufficient documentation

## 2016-11-10 DIAGNOSIS — K2970 Gastritis, unspecified, without bleeding: Secondary | ICD-10-CM | POA: Diagnosis not present

## 2016-11-10 DIAGNOSIS — N189 Chronic kidney disease, unspecified: Secondary | ICD-10-CM | POA: Insufficient documentation

## 2016-11-10 DIAGNOSIS — K449 Diaphragmatic hernia without obstruction or gangrene: Secondary | ICD-10-CM | POA: Diagnosis not present

## 2016-11-10 DIAGNOSIS — K219 Gastro-esophageal reflux disease without esophagitis: Secondary | ICD-10-CM | POA: Insufficient documentation

## 2016-11-10 DIAGNOSIS — E039 Hypothyroidism, unspecified: Secondary | ICD-10-CM | POA: Insufficient documentation

## 2016-11-10 DIAGNOSIS — R0789 Other chest pain: Secondary | ICD-10-CM | POA: Insufficient documentation

## 2016-11-10 DIAGNOSIS — D131 Benign neoplasm of stomach: Secondary | ICD-10-CM | POA: Diagnosis not present

## 2016-11-10 DIAGNOSIS — Z7982 Long term (current) use of aspirin: Secondary | ICD-10-CM | POA: Diagnosis not present

## 2016-11-10 DIAGNOSIS — K317 Polyp of stomach and duodenum: Secondary | ICD-10-CM | POA: Diagnosis not present

## 2016-11-10 DIAGNOSIS — K5900 Constipation, unspecified: Secondary | ICD-10-CM | POA: Diagnosis not present

## 2016-11-10 DIAGNOSIS — R1013 Epigastric pain: Secondary | ICD-10-CM | POA: Diagnosis present

## 2016-11-10 DIAGNOSIS — K2950 Unspecified chronic gastritis without bleeding: Secondary | ICD-10-CM | POA: Diagnosis not present

## 2016-11-10 HISTORY — DX: Essential (primary) hypertension: I10

## 2016-11-10 HISTORY — DX: Diarrhea, unspecified: R19.7

## 2016-11-10 HISTORY — DX: Hypothyroidism, unspecified: E03.9

## 2016-11-10 HISTORY — PX: ESOPHAGOGASTRODUODENOSCOPY (EGD) WITH PROPOFOL: SHX5813

## 2016-11-10 HISTORY — DX: Hyperlipidemia, unspecified: E78.5

## 2016-11-10 HISTORY — DX: Slow transit constipation: K59.01

## 2016-11-10 HISTORY — DX: Traumatic subdural hemorrhage with loss of consciousness of unspecified duration, initial encounter: S06.5X9A

## 2016-11-10 HISTORY — DX: Traumatic subdural hemorrhage with loss of consciousness status unknown, initial encounter: S06.5XAA

## 2016-11-10 SURGERY — ESOPHAGOGASTRODUODENOSCOPY (EGD) WITH PROPOFOL
Anesthesia: General

## 2016-11-10 MED ORDER — SODIUM CHLORIDE 0.9 % IV SOLN
INTRAVENOUS | Status: DC
  Administered 2016-11-10: 09:00:00 via INTRAVENOUS

## 2016-11-10 MED ORDER — GLYCOPYRROLATE 0.2 MG/ML IJ SOLN
INTRAMUSCULAR | Status: AC
  Filled 2016-11-10: qty 1

## 2016-11-10 MED ORDER — GLYCOPYRROLATE 0.2 MG/ML IJ SOLN
INTRAMUSCULAR | Status: DC | PRN
  Administered 2016-11-10: 0.1 mg via INTRAVENOUS

## 2016-11-10 MED ORDER — FENTANYL CITRATE (PF) 100 MCG/2ML IJ SOLN
INTRAMUSCULAR | Status: AC
  Filled 2016-11-10: qty 2

## 2016-11-10 MED ORDER — PROPOFOL 500 MG/50ML IV EMUL
INTRAVENOUS | Status: DC | PRN
  Administered 2016-11-10: 100 ug/kg/min via INTRAVENOUS

## 2016-11-10 MED ORDER — PROPOFOL 500 MG/50ML IV EMUL
INTRAVENOUS | Status: AC
  Filled 2016-11-10: qty 50

## 2016-11-10 MED ORDER — SODIUM CHLORIDE 0.9 % IV SOLN: INTRAVENOUS | Status: DC

## 2016-11-10 MED ORDER — BUTAMBEN-TETRACAINE-BENZOCAINE 2-2-14 % EX AERO
INHALATION_SPRAY | CUTANEOUS | Status: AC
  Filled 2016-11-10: qty 5

## 2016-11-10 MED ORDER — FENTANYL CITRATE (PF) 100 MCG/2ML IJ SOLN
INTRAMUSCULAR | Status: DC | PRN
  Administered 2016-11-10: 50 ug via INTRAVENOUS

## 2016-11-10 MED ORDER — LIDOCAINE HCL (PF) 2 % IJ SOLN
INTRAMUSCULAR | Status: AC
  Filled 2016-11-10: qty 2

## 2016-11-10 MED ORDER — LIDOCAINE HCL (CARDIAC) 20 MG/ML IV SOLN
INTRAVENOUS | Status: DC | PRN
  Administered 2016-11-10: 30 mg via INTRAVENOUS

## 2016-11-10 NOTE — H&P (Signed)
Outpatient short stay form Pre-procedure 11/10/2016 9:36 AM Lollie Sails MD  Primary Physician: Dr. Einar Pheasant  Reason for visit:  EGD  History of present illness:  Patient is a 76 year old female presenting today for follow-up on complaint of epigastric pain that seems to radiate upwards into the retrosternal region and then into her neck. She has had a cardiology evaluation in this regard. She has been on a twice a day PPI and was recently started on a low-dose of Reglan. That seems to have been of some benefit. Cardiology evaluation was negative per patient. She does take 81 mg aspirin daily  It is of note the patient does at times take medications immediately before going ahead. We discussed this at length.    Current Facility-Administered Medications:  .  0.9 %  sodium chloride infusion, , Intravenous, Continuous, Lollie Sails, MD, Last Rate: 20 mL/hr at 11/10/16 0907 .  0.9 %  sodium chloride infusion, , Intravenous, Continuous, Lollie Sails, MD .  0.9 %  sodium chloride infusion, , Intravenous, Continuous, Lollie Sails, MD .  butamben-tetracaine-benzocaine (CETACAINE) 04-17-12 % spray, , , ,   Prescriptions Prior to Admission  Medication Sig Dispense Refill Last Dose  . amLODipine (NORVASC) 2.5 MG tablet Take 1 tablet (2.5 mg total) by mouth daily. 90 tablet 3 11/09/2016 at Unknown time  . aspirin 81 MG tablet Take 1 tablet (81 mg total) by mouth daily. 30 tablet 3 11/09/2016 at Unknown time  . atorvastatin (LIPITOR) 20 MG tablet Take 1 tablet (20 mg total) by mouth daily. 90 tablet 0 11/09/2016 at Unknown time  . cholecalciferol (VITAMIN D) 1000 units tablet Take 1 tablet (1,000 Units total) by mouth 2 (two) times daily. (Patient taking differently: Take 2,000 Units by mouth daily. ) 60 tablet 2 11/09/2016 at Unknown time  . CRANBERRY PO Take 3,600 mg by mouth daily.     11/09/2016 at Unknown time  . DULoxetine (CYMBALTA) 60 MG capsule Take 1 capsule (60 mg  total) by mouth daily. 90 capsule 1 11/09/2016 at Unknown time  . fexofenadine-pseudoephedrine (ALLEGRA-D 24) 180-240 MG 24 hr tablet Take 1 tablet by mouth daily as needed. 90 tablet 3 Past Week at Unknown time  . glucosamine-chondroitin 500-400 MG tablet Take 1 tablet by mouth 3 (three) times daily. 90 tablet 3 11/09/2016 at Unknown time  . levothyroxine (SYNTHROID, LEVOTHROID) 75 MCG tablet TAKE 1 TABLET EVERY DAY BEFORE BREAKFAST 90 tablet 3 11/09/2016 at Unknown time  . losartan (COZAAR) 50 MG tablet Take 1 tablet (50 mg total) by mouth daily. 90 tablet 1 11/09/2016 at Unknown time  . metoCLOPramide (REGLAN) 5 MG tablet Take 5 mg by mouth 4 (four) times daily.   11/09/2016 at Unknown time  . Multiple Vitamins-Minerals (CENTRUM SILVER ULTRA WOMENS) TABS Take 1 tablet by mouth daily.     11/09/2016 at Unknown time  . Omega-3 Fatty Acids (FISH OIL) 1000 MG CAPS Take 1,000 mg by mouth daily.   11/09/2016 at Unknown time  . omeprazole (PRILOSEC) 20 MG capsule Take 1 capsule (20 mg total) by mouth 2 (two) times daily before a meal. 180 capsule 1 11/09/2016 at Unknown time  . quinapril (ACCUPRIL) 20 MG tablet Take 20 mg by mouth at bedtime.   11/09/2016 at Unknown time     Allergies  Allergen Reactions  . Quinapril Other (See Comments)    Hyperkalemia  . Codeine Rash     Past Medical History:  Diagnosis Date  . Chronic  kidney disease    Followed by Dr. Holley Raring  . Constipation due to slow transit   . Diarrhea   . Hyperlipidemia   . Hypertension   . Hypothyroidism   . Melanoma of lower leg ([place]) 2013  . Shingles 2013  . Shingles   . Spinal stenosis in cervical region    Dr. Sharlet Salina  . Subdural hematoma ([facility])     Review of systems:      Physical Exam    Heart and lungs: Regular rate and rhythm without rub or gallop, lungs are bilaterally clear.    HEENT: Normocephalic atraumatic eyes are anicteric    Other:     Pertinant exam for procedure: Soft nontender nondistended bowel sounds  positive normoactive.    Planned proceedures: EGD and indicated procedures. I have discussed the risks benefits and complications of procedures to include not limited to bleeding, infection, perforation and the risk of sedation and the patient wishes to proceed.    Lollie Sails, MD Gastroenterology 11/10/2016  9:36 AM

## 2016-11-10 NOTE — Anesthesia Postprocedure Evaluation (Signed)
Anesthesia Post Note  Patient: GETSEMANI LINDON  Procedure(s) Performed: Procedure(s) (LRB): ESOPHAGOGASTRODUODENOSCOPY (EGD) WITH PROPOFOL (N/A)  Patient location during evaluation: Endoscopy Anesthesia Type: General Level of consciousness: awake and alert Pain management: pain level controlled Vital Signs Assessment: post-procedure vital signs reviewed and stable Respiratory status: spontaneous breathing, nonlabored ventilation, respiratory function stable and patient connected to nasal cannula oxygen Cardiovascular status: blood pressure returned to baseline and stable Postop Assessment: no signs of nausea or vomiting Anesthetic complications: no     Last Vitals:  Vitals:   11/10/16 1025 11/10/16 1035  BP: 135/79 129/63  Pulse: 83 73  Resp: 17 20  Temp:    SpO2: 99% 100%    Last Pain:  Vitals:   11/10/16 1005  TempSrc: Tympanic  PainSc: Asleep                 Martha Clan

## 2016-11-10 NOTE — Op Note (Signed)
[facility] Gastroenterology Patient Name: Caitlin Lin Procedure Date: 11/10/2016 9:22 AM MRN: [id number] Account #: [id number] Date of Birth: [birth year] Admit Type: Outpatient Age: 76 Room: [facility] [place] ENDO ROOM 3 Gender: Female Note Status: Finalized Procedure:            Upper GI endoscopy Indications:          Epigastric abdominal pain, Chest pain (non cardiac) Providers:            Lollie Sails, MD Referring MD:         Einar Pheasant, MD (Referring MD) Medicines:            Monitored Anesthesia Care Complications:        No immediate complications. Procedure:            Pre-Anesthesia Assessment:                       - ASA Grade Assessment: II - A patient with mild                        systemic disease.                       After obtaining informed consent, the endoscope was                        passed under direct vision. Throughout the procedure,                        the patient's blood pressure, pulse, and oxygen                        saturations were monitored continuously. The Endoscope                        was introduced through the mouth, and advanced to the                        third part of duodenum. The upper GI endoscopy was                        accomplished without difficulty. The patient tolerated                        the procedure well. Findings:      A small hiatal hernia was found. The Z-line was a variable distance from       incisors; the hiatal hernia was sliding.      The Z-line was variable. Biopsies were taken with a cold forceps for       histology.      Diffuse and patchy mild inflammation characterized by congestion (edema)       and erythema was found in the gastric body and in the gastric antrum.       Biopsies were taken with a cold forceps for histology.      A few less than 1 mm sessile polyps with no bleeding and no stigmata of       recent bleeding were found in the gastric body. Biopsies were taken with       a cold forceps for histology.      A single 3 mm sessile polyp with no  bleeding and no stigmata of recent       bleeding was found in the cardia. The polyp was removed with a cold       biopsy forceps. Resection and retrieval were complete.      The cardia and gastric fundus were normal on retroflexion otherwise.      The examined duodenum was normal. Impression:           - Small hiatal hernia.                       - Z-line variable. Biopsied.                       - Bile gastritis. Biopsied.                       - A few gastric polyps. Biopsied.                       - A single gastric polyp. Resected and retrieved.                       - Normal examined duodenum. Recommendation:       - Discharge patient to home.                       - Use Protonix (pantoprazole) 40 mg PO BID for 3 weeks.                       - Use Protonix (pantoprazole) 40 mg PO daily.                       - continue reglan as currently                       - do not take medications within an hour before going                        to bed.                       - Return to GI clinic in 4 weeks. Procedure Code(s):    --- Professional ---                       [phone], Esophagogastroduodenoscopy, flexible, transoral;                        with biopsy, single or multiple Diagnosis Code(s):    --- Professional ---                       K44.9, Diaphragmatic hernia without obstruction or                        gangrene                       K22.8, Other specified diseases of esophagus                       K29.60, Other gastritis without bleeding  K31.7, Polyp of stomach and duodenum                       R10.13, Epigastric pain                       R07.89, Other chest pain CPT copyright 2016 American Medical Association. All rights reserved. The codes documented in this report are preliminary and upon coder review may  be revised to meet current compliance requirements. Lollie Sails, MD 11/10/2016 10:05:33 AM This report has been signed electronically. Number of Addenda: 0 Note Initiated On: 11/10/2016 9:22 AM      [facility]

## 2016-11-10 NOTE — Transfer of Care (Signed)
Immediate Anesthesia Transfer of Care Note  Patient: Caitlin Lin  Procedure(s) Performed: Procedure(s): ESOPHAGOGASTRODUODENOSCOPY (EGD) WITH PROPOFOL (N/A)  Patient Location: PACU  Anesthesia Type:General  Level of Consciousness: awake and sedated  Airway & Oxygen Therapy: Patient Spontanous Breathing and Patient connected to nasal cannula oxygen  Post-op Assessment: Report given to RN and Post -op Vital signs reviewed and stable  Post vital signs: Reviewed and stable  Last Vitals:  Vitals:   11/10/16 0851  BP: 133/76  Pulse: 79  Resp: 18  Temp: (!) 36.2 C  SpO2: 99%    Last Pain:  Vitals:   11/10/16 0851  TempSrc: Tympanic         Complications: No apparent anesthesia complications

## 2016-11-10 NOTE — Anesthesia Procedure Notes (Signed)
Performed by: COOK-MARTIN, Taison Celani Pre-anesthesia Checklist: Patient identified, Emergency Drugs available, Suction available, Patient being monitored and Timeout performed Patient Re-evaluated:Patient Re-evaluated prior to induction Oxygen Delivery Method: Nasal cannula Preoxygenation: Pre-oxygenation with 100% oxygen Induction Type: IV induction Placement Confirmation: positive ETCO2 and CO2 detector       

## 2016-11-10 NOTE — Anesthesia Preprocedure Evaluation (Signed)
Anesthesia Evaluation  Patient identified by MRN, date of birth, ID band Patient awake    Reviewed: Allergy & Precautions, H&P , NPO status , Patient's Chart, lab work & pertinent test results, reviewed documented beta blocker date and time   History of Anesthesia Complications Negative for: history of anesthetic complications  Airway Mallampati: I  TM Distance: >3 FB Neck ROM: full    Dental  (+) Caps, Dental Advidsory Given, Teeth Intact   Pulmonary neg pulmonary ROS,           Cardiovascular Exercise Tolerance: Good hypertension, On Medications (-) angina(-) CAD, (-) Past MI, (-) Cardiac Stents and (-) CABG (-) dysrhythmias (-) Valvular Problems/Murmurs     Neuro/Psych negative neurological ROS  negative psych ROS   GI/Hepatic GERD  ,NAFLD   Endo/Other  neg diabetesHypothyroidism   Renal/GU CRFRenal disease  negative genitourinary   Musculoskeletal   Abdominal   Peds  Hematology negative hematology ROS (+)   Anesthesia Other Findings Past Medical History: No date: Chronic kidney disease     Comment:  Followed by Dr. Holley Raring No date: Constipation due to slow transit No date: Diarrhea No date: Hyperlipidemia No date: Hypertension No date: Hypothyroidism 2013: Melanoma of lower leg ([place]) 2013: Shingles No date: Shingles No date: Spinal stenosis in cervical region     Comment:  Dr. Sharlet Salina No date: Subdural hematoma ([place])   Reproductive/Obstetrics negative OB ROS                             Anesthesia Physical Anesthesia Plan  ASA: II  Anesthesia Plan: General   Post-op Pain Management:    Induction: Intravenous  PONV Risk Score and Plan: 3 and Propofol infusion  Airway Management Planned: Natural Airway and Nasal Cannula  Additional Equipment:   Intra-op Plan:   Post-operative Plan:   Informed Consent: I have reviewed the patients History and Physical, chart,  labs and discussed the procedure including the risks, benefits and alternatives for the proposed anesthesia with the patient or authorized representative who has indicated his/her understanding and acceptance.   Dental Advisory Given  Plan Discussed with: Anesthesiologist, CRNA and Surgeon  Anesthesia Plan Comments:         Anesthesia Quick Evaluation

## 2016-11-10 NOTE — Anesthesia Post-op Follow-up Note (Signed)
Anesthesia QCDR form completed.        

## 2016-11-12 ENCOUNTER — Encounter: Payer: Self-pay | Admitting: Gastroenterology

## 2016-11-12 LAB — SURGICAL PATHOLOGY

## 2016-11-16 ENCOUNTER — Other Ambulatory Visit: Payer: Self-pay | Admitting: Internal Medicine

## 2016-11-24 DIAGNOSIS — K7460 Unspecified cirrhosis of liver: Secondary | ICD-10-CM | POA: Diagnosis not present

## 2016-11-24 DIAGNOSIS — R768 Other specified abnormal immunological findings in serum: Secondary | ICD-10-CM | POA: Diagnosis not present

## 2016-12-18 ENCOUNTER — Telehealth: Payer: Self-pay | Admitting: *Deleted

## 2016-12-18 MED ORDER — DULOXETINE HCL 60 MG PO CPEP: 60.0000 mg | ORAL_CAPSULE | Freq: Every day | ORAL | 1 refills | Status: DC

## 2016-12-18 NOTE — Telephone Encounter (Signed)
Last script 02/04/16 #90 with 1 rf Last o/v 10/06/16 F/u 02/10/17

## 2016-12-18 NOTE — Telephone Encounter (Signed)
Pt has requested a refill for Duloxetine  Pharmacy [address]

## 2016-12-18 NOTE — Telephone Encounter (Signed)
ok'd refill duloxetine #90 with one refill.

## 2017-01-05 ENCOUNTER — Other Ambulatory Visit: Payer: Self-pay | Admitting: Gastroenterology

## 2017-01-05 DIAGNOSIS — K7400 Hepatic fibrosis, unspecified: Secondary | ICD-10-CM

## 2017-01-05 DIAGNOSIS — K210 Gastro-esophageal reflux disease with esophagitis: Secondary | ICD-10-CM | POA: Diagnosis not present

## 2017-01-08 ENCOUNTER — Ambulatory Visit
Admission: RE | Admit: 2017-01-08 | Discharge: 2017-01-08 | Disposition: A | Discharge status: Home / Self Care | Payer: PPO | Source: Ambulatory Visit | Attending: Gastroenterology | Admitting: Gastroenterology

## 2017-01-08 DIAGNOSIS — K7400 Hepatic fibrosis, unspecified: Secondary | ICD-10-CM

## 2017-02-02 ENCOUNTER — Other Ambulatory Visit (INDEPENDENT_AMBULATORY_CARE_PROVIDER_SITE_OTHER): Payer: PPO

## 2017-02-02 DIAGNOSIS — I10 Essential (primary) hypertension: Secondary | ICD-10-CM

## 2017-02-02 DIAGNOSIS — E7800 Pure hypercholesterolemia, unspecified: Secondary | ICD-10-CM | POA: Diagnosis not present

## 2017-02-02 LAB — HEPATIC FUNCTION PANEL
ALBUMIN: 3.7 g/dL (ref 3.5–5.2)
ALK PHOS: 100 U/L (ref 39–117)
ALT: 17 U/L (ref 0–35)
AST: 21 U/L (ref 0–37)
Bilirubin, Direct: 0.1 mg/dL (ref 0.0–0.3)
Total Bilirubin: 0.5 mg/dL (ref 0.2–1.2)
Total Protein: 6.8 g/dL (ref 6.0–8.3)

## 2017-02-02 LAB — LIPID PANEL
CHOL/HDL RATIO: 4
Cholesterol: 167 mg/dL (ref 0–200)
HDL: 38.1 mg/dL — ABNORMAL LOW (ref >39.00)
NONHDL: 129.36
TRIGLYCERIDES: 202 mg/dL — AB (ref 0.0–149.0)
VLDL: 40.4 mg/dL — ABNORMAL HIGH (ref 0.0–40.0)

## 2017-02-02 LAB — BASIC METABOLIC PANEL
BUN: 25 mg/dL — ABNORMAL HIGH (ref 6–23)
CHLORIDE: 107 meq/L (ref 96–112)
CO2: 28 meq/L (ref 19–32)
CREATININE: 1.8 mg/dL — AB (ref 0.40–1.20)
Calcium: 9.7 mg/dL (ref 8.4–10.5)
GFR: 29.09 mL/min — ABNORMAL LOW (ref >60.00)
Glucose, Bld: 103 mg/dL — ABNORMAL HIGH (ref 70–99)
Potassium: 5.2 mEq/L — ABNORMAL HIGH (ref 3.5–5.1)
Sodium: 141 mEq/L (ref 135–145)

## 2017-02-02 LAB — LDL CHOLESTEROL, DIRECT: LDL DIRECT: 88 mg/dL

## 2017-02-03 ENCOUNTER — Other Ambulatory Visit: Payer: Self-pay | Admitting: Internal Medicine

## 2017-02-03 DIAGNOSIS — E875 Hyperkalemia: Secondary | ICD-10-CM

## 2017-02-03 NOTE — Progress Notes (Signed)
Order placed for f/u potassium.  

## 2017-02-07 ENCOUNTER — Other Ambulatory Visit: Payer: Self-pay | Admitting: Internal Medicine

## 2017-02-07 DIAGNOSIS — E875 Hyperkalemia: Secondary | ICD-10-CM

## 2017-02-07 DIAGNOSIS — R7989 Other specified abnormal findings of blood chemistry: Secondary | ICD-10-CM

## 2017-02-07 NOTE — Progress Notes (Signed)
Order placed for f/u lab.   

## 2017-02-08 ENCOUNTER — Other Ambulatory Visit (INDEPENDENT_AMBULATORY_CARE_PROVIDER_SITE_OTHER): Payer: PPO

## 2017-02-08 DIAGNOSIS — R7989 Other specified abnormal findings of blood chemistry: Secondary | ICD-10-CM

## 2017-02-08 DIAGNOSIS — E875 Hyperkalemia: Secondary | ICD-10-CM | POA: Diagnosis not present

## 2017-02-08 LAB — BASIC METABOLIC PANEL
BUN: 27 mg/dL — ABNORMAL HIGH (ref 6–23)
CHLORIDE: 106 meq/L (ref 96–112)
CO2: 27 mEq/L (ref 19–32)
Calcium: 9.5 mg/dL (ref 8.4–10.5)
Creatinine, Ser: 1.78 mg/dL — ABNORMAL HIGH (ref 0.40–1.20)
GFR: 29.47 mL/min — AB (ref >60.00)
Glucose, Bld: 108 mg/dL — ABNORMAL HIGH (ref 70–99)
POTASSIUM: 4.6 meq/L (ref 3.5–5.1)
SODIUM: 141 meq/L (ref 135–145)

## 2017-02-09 ENCOUNTER — Other Ambulatory Visit: Payer: Self-pay | Admitting: *Deleted

## 2017-02-10 ENCOUNTER — Ambulatory Visit: Payer: PPO | Admitting: Internal Medicine

## 2017-02-10 ENCOUNTER — Encounter: Payer: Self-pay | Admitting: Internal Medicine

## 2017-02-10 VITALS — BP 160/86 | HR 98 | Temp 98.4°F | Ht 63.0 in | Wt 154.6 lb

## 2017-02-10 DIAGNOSIS — J3489 Other specified disorders of nose and nasal sinuses: Secondary | ICD-10-CM

## 2017-02-10 DIAGNOSIS — K219 Gastro-esophageal reflux disease without esophagitis: Secondary | ICD-10-CM

## 2017-02-10 DIAGNOSIS — D509 Iron deficiency anemia, unspecified: Secondary | ICD-10-CM | POA: Diagnosis not present

## 2017-02-10 DIAGNOSIS — E039 Hypothyroidism, unspecified: Secondary | ICD-10-CM

## 2017-02-10 DIAGNOSIS — N189 Chronic kidney disease, unspecified: Secondary | ICD-10-CM | POA: Diagnosis not present

## 2017-02-10 DIAGNOSIS — I10 Essential (primary) hypertension: Secondary | ICD-10-CM

## 2017-02-10 DIAGNOSIS — K760 Fatty (change of) liver, not elsewhere classified: Secondary | ICD-10-CM | POA: Diagnosis not present

## 2017-02-10 DIAGNOSIS — E7800 Pure hypercholesterolemia, unspecified: Secondary | ICD-10-CM

## 2017-02-10 NOTE — Patient Instructions (Signed)
Saline nasal spray - flush nose at least 2-3x/day  flonase nasal spray - 2 sprays each nostril one time per day.  Do this in the evening.    Robitussin DM twice a day as needed

## 2017-02-10 NOTE — Progress Notes (Signed)
Patient ID: Caitlin Lin, female   DOB: 1940-12-23, 76 y.o.   MRN: [id number]   Subjective:    Patient ID: Caitlin Lin, female    DOB: Jan 15, 1941, 76 y.o.   MRN: [id number]  HPI  Patient here for a scheduled follow up.  States she is doing relatively well.  Did notice (starting one week ago) some increased drainage and hoarseness.  Irritated throat.  No sinus pressure.  No nausea or vomiting.  Bowels moving.  Has flonase.  No chest pain.  No sob.  Saw GI.  Had ultrasound.  Fatty liver - no cirrhosis.  Epigastric pain resolved.  Had EGD - revealed gastritis.  Recommended f/u in 6 months.  Overall she feels she is doing well.    Past Medical History:  Diagnosis Date  . Chronic kidney disease    Followed by Dr. Holley Raring  . Constipation due to slow transit   . Diarrhea   . Hyperlipidemia   . Hypertension   . Hypothyroidism   . Melanoma of lower leg ([place]) 2013  . Shingles 2013  . Shingles   . Spinal stenosis in cervical region    Dr. Sharlet Salina  . Subdural hematoma [facility])    Past Surgical History:  Procedure Laterality Date  . ABDOMINAL HYSTERECTOMY  1974  . ANTERIOR AND POSTERIOR VAGINAL REPAIR    . ANTERIOR AND POSTERIOR VAGINAL REPAIR W/ SACROSPINOUS LIGAMENT SUSPENSION Right   . BREAST BIOPSY  1963   Benign per pt's report  . COLONOSCOPY    . ESOPHAGOGASTRODUODENOSCOPY    . ESOPHAGOGASTRODUODENOSCOPY (EGD) WITH PROPOFOL N/A 11/10/2016   Procedure: ESOPHAGOGASTRODUODENOSCOPY (EGD) WITH PROPOFOL;  Surgeon: Lollie Sails, MD;  Location: [facility] ENDOSCOPY;  Service: Endoscopy;  Laterality: N/A;  . LUNG BIOPSY  2007   Benign per pt's report  . LUNG BIOPSY Right   . OOPHORECTOMY  1970  . PERINEOPLASTY  12/17/2013  . sling for stress incontinence  12/18/2011   Family History  Problem Relation Age of Onset  . Arthritis Mother   . Liver disease Mother   . Cirrhosis Mother   . Heart disease Father   . Heart attack Father   . Depression Brother   . Breast cancer Other   .  Breast cancer Cousin        maternal cousins   Social History   Socioeconomic History  . Marital status: Married    Spouse name: None  . Number of children: 2  . Years of education: None  . Highest education level: None  Social Needs  . Financial resource strain: None  . Food insecurity - worry: None  . Food insecurity - inability: None  . Transportation needs - medical: None  . Transportation needs - non-medical: None  Occupational History  . Occupation: Retired  Tobacco Use  . Smoking status: Never Smoker  . Smokeless tobacco: Never Used  Substance and Sexual Activity  . Alcohol use: No  . Drug use: No  . Sexual activity: Yes  Other Topics Concern  . None  Social History Narrative   Lives in [place] with husband. Has 2 children boy and girl, 4 grandchildren. Retired Presenter, broadcasting.       Daily Caffeine Use:  NO   Regular Exercise -  Not at this time due to back pain, would like to resume soon    Outpatient Encounter Medications as of 02/10/2017  Medication Sig  . amLODipine (NORVASC) 2.5 MG tablet Take 1 tablet (2.5 mg total) by mouth  daily.  . aspirin 81 MG tablet Take 1 tablet (81 mg total) by mouth daily.  Marland Kitchen atorvastatin (LIPITOR) 20 MG tablet TAKE 1 TABLET BY MOUTH ONCE DAILY  . DULoxetine (CYMBALTA) 60 MG capsule Take 1 capsule (60 mg total) by mouth daily.  . fexofenadine-pseudoephedrine (ALLEGRA-D 24) 180-240 MG 24 hr tablet Take 1 tablet by mouth daily as needed.  Marland Kitchen glucosamine-chondroitin 500-400 MG tablet Take 1 tablet by mouth 3 (three) times daily.  Marland Kitchen levothyroxine (SYNTHROID, LEVOTHROID) 75 MCG tablet TAKE 1 TABLET EVERY DAY BEFORE BREAKFAST  . losartan (COZAAR) 50 MG tablet Take 1 tablet (50 mg total) by mouth daily.  . Omega-3 Fatty Acids (FISH OIL) 1000 MG CAPS Take 1,000 mg by mouth daily.  Marland Kitchen omeprazole (PRILOSEC) 20 MG capsule Take 1 capsule (20 mg total) by mouth 2 (two) times daily before a meal.  . pantoprazole (PROTONIX) 40 MG tablet    No  facility-administered encounter medications on file as of 02/10/2017.     Review of Systems  Constitutional: Negative for appetite change and unexpected weight change.  HENT: Positive for congestion and postnasal drip. Negative for sinus pressure.   Respiratory: Negative for cough, chest tightness and shortness of breath.   Cardiovascular: Negative for chest pain, palpitations and leg swelling.  Gastrointestinal: Negative for abdominal pain, diarrhea, nausea and vomiting.  Genitourinary: Negative for difficulty urinating and dysuria.  Musculoskeletal: Negative for back pain and joint swelling.  Skin: Negative for color change and rash.  Neurological: Negative for dizziness, light-headedness and headaches.  Psychiatric/Behavioral: Negative for agitation and dysphoric mood.       Objective:    Physical Exam  Constitutional: She appears well-developed and well-nourished. No distress.  HENT:  Nose: Nose normal.  Mouth/Throat: Oropharynx is clear and moist.  Neck: Neck supple. No thyromegaly present.  Cardiovascular: Normal rate and regular rhythm.  Pulmonary/Chest: Breath sounds normal. No respiratory distress. She has no wheezes.  Abdominal: Soft. Bowel sounds are normal. There is no tenderness.  Musculoskeletal: She exhibits no edema or tenderness.  Lymphadenopathy:    She has no cervical adenopathy.  Skin: No rash noted. No erythema.  Psychiatric: She has a normal mood and affect. Her behavior is normal.    BP (!) 160/86   Pulse 98   Temp 98.4 F (36.9 C) (Oral)   Ht 5\' 3"  (1.6 m)   Wt 154 lb 9.6 oz (70.1 kg)   SpO2 98%   BMI 27.39 kg/m  Wt Readings from Last 3 Encounters:  02/10/17 154 lb 9.6 oz (70.1 kg)  11/10/16 156 lb (70.8 kg)  10/06/16 156 lb 9.6 oz (71 kg)     Lab Results  Component Value Date   WBC 5.7 06/30/2016   HGB 12.2 06/30/2016   HCT 36.4 06/30/2016   PLT 213.0 06/30/2016   GLUCOSE 108 (H) 02/08/2017   CHOL 167 02/02/2017   TRIG 202.0 (H)  02/02/2017   HDL 38.10 (L) 02/02/2017   LDLDIRECT 88.0 02/02/2017   LDLCALC 132 (H) 06/30/2016   ALT 17 02/02/2017   AST 21 02/02/2017   NA 141 02/08/2017   K 4.6 02/08/2017   CL 106 02/08/2017   CREATININE 1.78 (H) 02/08/2017   BUN 27 (H) 02/08/2017   CO2 27 02/08/2017   TSH 1.74 03/02/2016   MICROALBUR 2.1 (H) 03/27/2015    US Abdomen Complete W/elastography  Result Date: 01/08/2017 CLINICAL DATA:  Hepatic fibrosis.  Followup prior examinations. EXAM: ULTRASOUND ABDOMEN ULTRASOUND HEPATIC ELASTOGRAPHY TECHNIQUE: Sonography of the  upper abdomen was performed. In addition, ultrasound elastography evaluation of the liver was performed. A region of interest was placed within the right lobe of the liver. Following application of a compressive sonographic pulse, shear waves were detected in the adjacent hepatic tissue and the shear wave velocity was calculated. Multiple assessments were performed at the selected site. Median shear wave velocity is correlated to a Metavir fibrosis score. COMPARISON:  07/06/2016 and 05/22/2016 FINDINGS: ULTRASOUND ABDOMEN Gallbladder: No gallstones or wall thickening visualized. No sonographic Murphy sign noted by sonographer. Common bile duct: Diameter: 1.9 mm Liver: Diffuse increased echogenicity and coarse echotexture but no focal hepatic lesions or intrahepatic biliary dilatation. Portal vein is patent on color Doppler imaging with normal direction of blood flow towards the liver. IVC: Normal caliber are Pancreas: Visualized portion unremarkable. Spleen: Size and appearance within normal limits. Right Kidney: Length: 9.2 cm. Normal renal cortical thickness and echogenicity without focal lesions or hydronephrosis. Left Kidney: Length: 9.0 cm. Normal renal cortical thickness and echogenicity without focal lesions or hydronephrosis. Abdominal aorta: Normal caliber Other findings: None. ULTRASOUND HEPATIC ELASTOGRAPHY Device: Siemens Helix VTQ Patient position: Oblique  Transducer 6C1 Number of measurements: 10 Hepatic segment:  8 Median velocity:   0.84  m/sec IQR: 0.18 IQR/Median velocity ratio: 0.21 Corresponding Metavir fibrosis score:  F0/F1 Risk of fibrosis: Minimal Limitations of exam: None Pertinent findings noted on other imaging exams:  None Please note that abnormal shear wave velocities may also be identified in clinical settings other than with hepatic fibrosis, such as: acute hepatitis, elevated right heart and central venous pressures including use of beta blockers, veno-occlusive disease (Budd-Chiari), infiltrative processes such as mastocytosis/amyloidosis/infiltrative tumor, extrahepatic cholestasis, in the post-prandial state, and liver transplantation. Correlation with patient history, laboratory data, and clinical condition recommended. IMPRESSION: ULTRASOUND ABDOMEN: Unremarkable abdominal ultrasound examination. ULTRASOUND HEPATIC ELASTOGRAPHY: Median hepatic shear wave velocity is calculated at 0.84 m/sec. Corresponding Metavir fibrosis score is  F0/F1. Risk of fibrosis is Minimal. Follow-up: None required Fairly remarkable decrease in median shear velocity when compared to prior study. I assume this patient has been treated? Other possibilities would include that the prior study which showed fibrosis may have been due to another process such as acute hepatitis which has resolved. Recommend clinical correlation. A six-month follow-up ultrasound may be helpful to reassess. Correlation with liver function studies is also suggested. Electronically Signed   By: Marijo Sanes M.D.   On: 01/08/2017 10:00       Assessment & Plan:   Problem List Items Addressed This Visit    Anemia, iron deficiency    Has a history of anemia.  Follow cbc.       Chronic kidney disease    Followed by nephrology.  Recent creatinine 1.78.        Fatty liver    Recent liver panel wnl.  Saw GI.  Ultrasound - no cirrhosis.        Relevant Orders   Hepatic function panel    GERD (gastroesophageal reflux disease)    On omeprazole.  Symptoms controlled.  No epigastric pain now.  Follow.        Relevant Orders   Lipid panel   Hypercholesterolemia    On lipitor.  Follow lipid panel and liver function tests.        Hypertension (Chronic)    Blood pressure on recheck improved.   Continue same medication regimen.  Follow pressures.  Follow metabolic panel.        Relevant Orders  CBC with Differential/Platelet   Basic metabolic panel   Hypothyroidism (Chronic)    On thyroid replacement.  Follow tsh.        Relevant Orders   TSH    Other Visit Diagnoses    Nasal drainage    -  Primary   Symptoms as outlined.  Flonase and saline nasal spray as directed.  Robitussin.  Follow.  Do not feel abx warranted.         Einar Pheasant, MD

## 2017-02-10 NOTE — Progress Notes (Signed)
Pre visit review using our clinic review tool, if applicable. No additional management support is needed unless otherwise documented below in the visit note. 

## 2017-02-13 ENCOUNTER — Other Ambulatory Visit: Payer: Self-pay | Admitting: Internal Medicine

## 2017-02-13 ENCOUNTER — Encounter: Payer: Self-pay | Admitting: Internal Medicine

## 2017-02-13 DIAGNOSIS — K760 Fatty (change of) liver, not elsewhere classified: Secondary | ICD-10-CM | POA: Insufficient documentation

## 2017-02-13 MED ORDER — LOSARTAN POTASSIUM 50 MG PO TABS: 50.0000 mg | ORAL_TABLET | Freq: Every day | ORAL | 3 refills | Status: DC

## 2017-02-13 MED ORDER — ATORVASTATIN CALCIUM 20 MG PO TABS: 20.0000 mg | ORAL_TABLET | Freq: Every day | ORAL | 3 refills | Status: DC

## 2017-02-13 MED ORDER — LEVOTHYROXINE SODIUM 75 MCG PO TABS: ORAL_TABLET | ORAL | 3 refills | Status: DC

## 2017-02-13 MED ORDER — AMLODIPINE BESYLATE 2.5 MG PO TABS: 2.5000 mg | ORAL_TABLET | Freq: Every day | ORAL | 3 refills | Status: DC

## 2017-02-13 NOTE — Assessment & Plan Note (Signed)
On lipitor.  Follow lipid panel and liver function tests.   

## 2017-02-13 NOTE — Assessment & Plan Note (Signed)
Followed by nephrology.  Recent creatinine 1.78.

## 2017-02-13 NOTE — Assessment & Plan Note (Signed)
On thyroid replacement.  Follow tsh.  

## 2017-02-13 NOTE — Assessment & Plan Note (Signed)
Has a history of anemia.  Follow cbc.

## 2017-02-13 NOTE — Assessment & Plan Note (Signed)
Recent liver panel wnl.  Saw GI.  Ultrasound - no cirrhosis.

## 2017-02-13 NOTE — Assessment & Plan Note (Addendum)
Blood pressure on recheck improved.  Continue same medication regimen.  Follow pressures.  Follow metabolic panel.   

## 2017-02-13 NOTE — Assessment & Plan Note (Signed)
On omeprazole.  Symptoms controlled.  No epigastric pain now.  Follow.

## 2017-02-19 DIAGNOSIS — R809 Proteinuria, unspecified: Secondary | ICD-10-CM | POA: Diagnosis not present

## 2017-02-19 DIAGNOSIS — N183 Chronic kidney disease, stage 3 (moderate): Secondary | ICD-10-CM | POA: Diagnosis not present

## 2017-02-19 DIAGNOSIS — I10 Essential (primary) hypertension: Secondary | ICD-10-CM | POA: Diagnosis not present

## 2017-02-19 DIAGNOSIS — N2581 Secondary hyperparathyroidism of renal origin: Secondary | ICD-10-CM | POA: Diagnosis not present

## 2017-06-01 ENCOUNTER — Other Ambulatory Visit: Payer: Self-pay

## 2017-06-01 ENCOUNTER — Other Ambulatory Visit (INDEPENDENT_AMBULATORY_CARE_PROVIDER_SITE_OTHER): Payer: PPO

## 2017-06-01 DIAGNOSIS — K760 Fatty (change of) liver, not elsewhere classified: Secondary | ICD-10-CM | POA: Diagnosis not present

## 2017-06-01 DIAGNOSIS — K219 Gastro-esophageal reflux disease without esophagitis: Secondary | ICD-10-CM

## 2017-06-01 DIAGNOSIS — E039 Hypothyroidism, unspecified: Secondary | ICD-10-CM | POA: Diagnosis not present

## 2017-06-01 DIAGNOSIS — I10 Essential (primary) hypertension: Secondary | ICD-10-CM | POA: Diagnosis not present

## 2017-06-01 DIAGNOSIS — Z0000 Encounter for general adult medical examination without abnormal findings: Secondary | ICD-10-CM

## 2017-06-01 LAB — LIPID PANEL
CHOL/HDL RATIO: 4
Cholesterol: 165 mg/dL (ref 0–200)
HDL: 42.1 mg/dL (ref >39.00)
LDL CALC: 88 mg/dL (ref 0–99)
NonHDL: 123.32
Triglycerides: 177 mg/dL — ABNORMAL HIGH (ref 0.0–149.0)
VLDL: 35.4 mg/dL (ref 0.0–40.0)

## 2017-06-01 LAB — BASIC METABOLIC PANEL
BUN: 31 mg/dL — ABNORMAL HIGH (ref 6–23)
CALCIUM: 9.6 mg/dL (ref 8.4–10.5)
CHLORIDE: 106 meq/L (ref 96–112)
CO2: 28 meq/L (ref 19–32)
CREATININE: 1.77 mg/dL — AB (ref 0.40–1.20)
GFR: 29.64 mL/min — ABNORMAL LOW (ref >60.00)
GLUCOSE: 107 mg/dL — AB (ref 70–99)
Potassium: 4.8 mEq/L (ref 3.5–5.1)
Sodium: 140 mEq/L (ref 135–145)

## 2017-06-01 LAB — HEPATIC FUNCTION PANEL
ALBUMIN: 4 g/dL (ref 3.5–5.2)
ALK PHOS: 91 U/L (ref 39–117)
ALT: 16 U/L (ref 0–35)
AST: 20 U/L (ref 0–37)
Bilirubin, Direct: 0.1 mg/dL (ref 0.0–0.3)
Total Bilirubin: 0.7 mg/dL (ref 0.2–1.2)
Total Protein: 6.9 g/dL (ref 6.0–8.3)

## 2017-06-01 LAB — CBC WITH DIFFERENTIAL/PLATELET
Basophils Absolute: 0 10*3/uL (ref 0.0–0.1)
Basophils Relative: 0.7 % (ref 0.0–3.0)
EOS PCT: 2.1 % (ref 0.0–5.0)
Eosinophils Absolute: 0.1 10*3/uL (ref 0.0–0.7)
HEMATOCRIT: 37.2 % (ref 36.0–46.0)
HEMOGLOBIN: 12.6 g/dL (ref 12.0–15.0)
Lymphs Abs: 2.5 10*3/uL (ref 0.7–4.0)
MCHC: 33.8 g/dL (ref 30.0–36.0)
MCV: 86.7 fl (ref 78.0–100.0)
MONOS PCT: 13.7 % — AB (ref 3.0–12.0)
Monocytes Absolute: 0.6 10*3/uL (ref 0.1–1.0)
Neutro Abs: 1.2 10*3/uL — ABNORMAL LOW (ref 1.4–7.7)
Neutrophils Relative %: 26.4 % — ABNORMAL LOW (ref 43.0–77.0)
Platelets: 181 10*3/uL (ref 150.0–400.0)
RBC: 4.29 Mil/uL (ref 3.87–5.11)
RDW: 13.8 % (ref 11.5–15.5)
WBC: 4.4 10*3/uL (ref 4.0–10.5)

## 2017-06-01 LAB — TSH: TSH: 1.61 u[IU]/mL (ref 0.35–4.50)

## 2017-06-02 LAB — PATHOLOGIST SMEAR REVIEW

## 2017-06-03 ENCOUNTER — Encounter: Payer: Self-pay | Admitting: Internal Medicine

## 2017-06-03 ENCOUNTER — Ambulatory Visit (INDEPENDENT_AMBULATORY_CARE_PROVIDER_SITE_OTHER): Payer: PPO | Admitting: Internal Medicine

## 2017-06-03 VITALS — BP 128/78 | HR 98 | Temp 98.5°F | Resp 18 | Ht 63.0 in | Wt 156.2 lb

## 2017-06-03 DIAGNOSIS — R109 Unspecified abdominal pain: Secondary | ICD-10-CM | POA: Insufficient documentation

## 2017-06-03 DIAGNOSIS — I10 Essential (primary) hypertension: Secondary | ICD-10-CM

## 2017-06-03 DIAGNOSIS — R1084 Generalized abdominal pain: Secondary | ICD-10-CM

## 2017-06-03 DIAGNOSIS — E039 Hypothyroidism, unspecified: Secondary | ICD-10-CM | POA: Diagnosis not present

## 2017-06-03 DIAGNOSIS — M5441 Lumbago with sciatica, right side: Secondary | ICD-10-CM | POA: Diagnosis not present

## 2017-06-03 DIAGNOSIS — K219 Gastro-esophageal reflux disease without esophagitis: Secondary | ICD-10-CM | POA: Diagnosis not present

## 2017-06-03 DIAGNOSIS — R51 Headache: Secondary | ICD-10-CM | POA: Diagnosis not present

## 2017-06-03 DIAGNOSIS — Z0000 Encounter for general adult medical examination without abnormal findings: Secondary | ICD-10-CM | POA: Diagnosis not present

## 2017-06-03 DIAGNOSIS — R519 Headache, unspecified: Secondary | ICD-10-CM

## 2017-06-03 DIAGNOSIS — K760 Fatty (change of) liver, not elsewhere classified: Secondary | ICD-10-CM

## 2017-06-03 DIAGNOSIS — N183 Chronic kidney disease, stage 3 unspecified: Secondary | ICD-10-CM

## 2017-06-03 DIAGNOSIS — D509 Iron deficiency anemia, unspecified: Secondary | ICD-10-CM | POA: Diagnosis not present

## 2017-06-03 DIAGNOSIS — E7800 Pure hypercholesterolemia, unspecified: Secondary | ICD-10-CM

## 2017-06-03 NOTE — Assessment & Plan Note (Signed)
Physical today 06/03/17.  Mammogram 10/28/16 - Birads I.  Colonoscopy 06/2011.  Recommended f/u colonoscopy 06/2021.

## 2017-06-03 NOTE — Progress Notes (Signed)
Patient ID: Caitlin Lin, female   DOB: [birth year], 77 y.o.   MRN: [id number]   Subjective:    Patient ID: Caitlin Lin, female    DOB: 1940/08/21, 77 y.o.   MRN: [id number]  HPI  Patient with past history of hypercholesterolemia, CKD and hypertension.  She comes in today to follow up on these issues as well as for a complete physical exam.  She is followed by nephrology.  GFR stable - 30.  She reports having some RUQ pain and lower anterior rib pain.  Flares intermittently.  No pain with deep breathing.  No chest pain.  No sob.  Still with some increased gas.  Probiotics did not help.  Plans to start Beano.  Still with low back pain.  Pain radiates down right leg.  Request referral to Dr Sharlet Salina.  Also having persistent increased sinus pressure - left.  Has tried antihistamines, nasal sprays, etc.  Persistent pressure.  Request referral to Dr Tami Ribas for further evaluation.     Past Medical History:  Diagnosis Date  . Chronic kidney disease    Followed by Dr. Holley Raring  . Constipation due to slow transit   . Diarrhea   . Hyperlipidemia   . Hypertension   . Hypothyroidism   . Melanoma of lower leg ([place]) 2013  . Shingles 2013  . Shingles   . Spinal stenosis in cervical region    Dr. Sharlet Salina  . Subdural hematoma [facility])    Past Surgical History:  Procedure Laterality Date  . ABDOMINAL HYSTERECTOMY  1974  . ANTERIOR AND POSTERIOR VAGINAL REPAIR    . ANTERIOR AND POSTERIOR VAGINAL REPAIR W/ SACROSPINOUS LIGAMENT SUSPENSION Right   . BREAST BIOPSY  1963   Benign per pt's report  . COLONOSCOPY    . ESOPHAGOGASTRODUODENOSCOPY    . ESOPHAGOGASTRODUODENOSCOPY (EGD) WITH PROPOFOL N/A 11/10/2016   Procedure: ESOPHAGOGASTRODUODENOSCOPY (EGD) WITH PROPOFOL;  Surgeon: Lollie Sails, MD;  Location: [facility] ENDOSCOPY;  Service: Endoscopy;  Laterality: N/A;  . LUNG BIOPSY  2007   Benign per pt's report  . LUNG BIOPSY Right   . OOPHORECTOMY  1970  . PERINEOPLASTY  12/17/2013  . sling for  stress incontinence  12/18/2011   Family History  Problem Relation Age of Onset  . Arthritis Mother   . Liver disease Mother   . Cirrhosis Mother   . Heart disease Father   . Heart attack Father   . Depression Brother   . Breast cancer Other   . Breast cancer Cousin        maternal cousins   Social History   Socioeconomic History  . Marital status: Married    Spouse name: Not on file  . Number of children: 2  . Years of education: Not on file  . Highest education level: Not on file  Occupational History  . Occupation: Retired  Scientific laboratory technician  . Financial resource strain: Not on file  . Food insecurity:    Worry: Not on file    Inability: Not on file  . Transportation needs:    Medical: Not on file    Non-medical: Not on file  Tobacco Use  . Smoking status: Never Smoker  . Smokeless tobacco: Never Used  Substance and Sexual Activity  . Alcohol use: No  . Drug use: No  . Sexual activity: Yes  Lifestyle  . Physical activity:    Days per week: Not on file    Minutes per session: Not on file  . Stress:  Not on file  Relationships  . Social connections:    Talks on phone: Not on file    Gets together: Not on file    Attends religious service: Not on file    Active member of club or organization: Not on file    Attends meetings of clubs or organizations: Not on file    Relationship status: Not on file  Other Topics Concern  . Not on file  Social History Narrative   Lives in [place] with husband. Has 2 children boy and girl, 4 grandchildren. Retired Presenter, broadcasting.       Daily Caffeine Use:  NO   Regular Exercise -  Not at this time due to back pain, would like to resume soon    Outpatient Encounter Medications as of 06/03/2017  Medication Sig  . amLODipine (NORVASC) 2.5 MG tablet Take 1 tablet (2.5 mg total) by mouth daily.  Marland Kitchen aspirin 81 MG tablet Take 1 tablet (81 mg total) by mouth daily.  . DULoxetine (CYMBALTA) 60 MG capsule Take 1 capsule (60 mg total) by  mouth daily.  . fexofenadine-pseudoephedrine (ALLEGRA-D 24) 180-240 MG 24 hr tablet Take 1 tablet by mouth daily as needed.  . fluticasone (FLONASE) 50 MCG/ACT nasal spray   . glucosamine-chondroitin 500-400 MG tablet Take 1 tablet by mouth 3 (three) times daily.  Marland Kitchen levothyroxine (SYNTHROID, LEVOTHROID) 75 MCG tablet TAKE 1 TABLET EVERY DAY BEFORE BREAKFAST  . losartan (COZAAR) 50 MG tablet Take 1 tablet (50 mg total) by mouth daily.  . Omega-3 Fatty Acids (FISH OIL) 1000 MG CAPS Take 1,000 mg by mouth daily.  . pantoprazole (PROTONIX) 40 MG tablet   . [DISCONTINUED] atorvastatin (LIPITOR) 20 MG tablet Take 1 tablet (20 mg total) by mouth daily.  . [DISCONTINUED] atorvastatin (LIPITOR) 20 MG tablet TAKE 1 TABLET BY MOUTH ONCE DAILY  . [DISCONTINUED] omeprazole (PRILOSEC) 20 MG capsule Take 1 capsule (20 mg total) by mouth 2 (two) times daily before a meal.   No facility-administered encounter medications on file as of 06/03/2017.     Review of Systems  Constitutional: Negative for appetite change and unexpected weight change.  HENT: Positive for sinus pressure. Negative for congestion and sore throat.   Eyes: Negative for pain and visual disturbance.  Respiratory: Negative for cough, chest tightness and shortness of breath.   Cardiovascular: Negative for chest pain, palpitations and leg swelling.  Gastrointestinal: Negative for diarrhea, nausea and vomiting.       RUQ pain as outlined.  Increased gas.    Genitourinary: Negative for difficulty urinating and frequency.  Musculoskeletal: Positive for back pain. Negative for joint swelling.  Skin: Negative for color change and rash.  Neurological: Negative for dizziness and headaches.  Hematological: Negative for adenopathy. Does not bruise/bleed easily.  Psychiatric/Behavioral: Negative for decreased concentration and dysphoric mood.       Objective:    Physical Exam  Constitutional: She appears well-developed and well-nourished. No  distress.  HENT:  Nose: Nose normal.  Mouth/Throat: Oropharynx is clear and moist.  Neck: Neck supple. No thyromegaly present.  Cardiovascular: Normal rate and regular rhythm.  Pulmonary/Chest: Breath sounds normal. No respiratory distress. She has no wheezes.  Abdominal: Soft. Bowel sounds are normal. There is no tenderness.  Musculoskeletal: She exhibits no edema or tenderness.  Lymphadenopathy:    She has no cervical adenopathy.  Skin: No rash noted. No erythema.  Psychiatric: She has a normal mood and affect. Her behavior is normal.    BP 128/78 (  BP Location: Left Arm, Patient Position: Sitting, Cuff Size: Normal)   Pulse 98   Temp 98.5 F (36.9 C) (Oral)   Resp 18   Ht 5\' 3"  (1.6 m)   Wt 156 lb 3.2 oz (70.9 kg)   SpO2 97%   BMI 27.67 kg/m  Wt Readings from Last 3 Encounters:  06/03/17 156 lb 3.2 oz (70.9 kg)  02/10/17 154 lb 9.6 oz (70.1 kg)  11/10/16 156 lb (70.8 kg)     Lab Results  Component Value Date   WBC 4.4 06/01/2017   HGB 12.6 06/01/2017   HCT 37.2 06/01/2017   PLT 181.0 06/01/2017   GLUCOSE 107 (H) 06/01/2017   CHOL 165 06/01/2017   TRIG 177.0 (H) 06/01/2017   HDL 42.10 06/01/2017   LDLDIRECT 88.0 02/02/2017   LDLCALC 88 06/01/2017   ALT 16 06/01/2017   AST 20 06/01/2017   NA 140 06/01/2017   K 4.8 06/01/2017   CL 106 06/01/2017   CREATININE 1.77 (H) 06/01/2017   BUN 31 (H) 06/01/2017   CO2 28 06/01/2017   TSH 1.61 06/01/2017   MICROALBUR 2.1 (H) 03/27/2015    US Abdomen Complete W/elastography  Result Date: 01/08/2017 CLINICAL DATA:  Hepatic fibrosis.  Followup prior examinations. EXAM: ULTRASOUND ABDOMEN ULTRASOUND HEPATIC ELASTOGRAPHY TECHNIQUE: Sonography of the upper abdomen was performed. In addition, ultrasound elastography evaluation of the liver was performed. A region of interest was placed within the right lobe of the liver. Following application of a compressive sonographic pulse, shear waves were detected in the adjacent hepatic  tissue and the shear wave velocity was calculated. Multiple assessments were performed at the selected site. Median shear wave velocity is correlated to a Metavir fibrosis score. COMPARISON:  07/06/2016 and 05/22/2016 FINDINGS: ULTRASOUND ABDOMEN Gallbladder: No gallstones or wall thickening visualized. No sonographic Murphy sign noted by sonographer. Common bile duct: Diameter: 1.9 mm Liver: Diffuse increased echogenicity and coarse echotexture but no focal hepatic lesions or intrahepatic biliary dilatation. Portal vein is patent on color Doppler imaging with normal direction of blood flow towards the liver. IVC: Normal caliber are Pancreas: Visualized portion unremarkable. Spleen: Size and appearance within normal limits. Right Kidney: Length: 9.2 cm. Normal renal cortical thickness and echogenicity without focal lesions or hydronephrosis. Left Kidney: Length: 9.0 cm. Normal renal cortical thickness and echogenicity without focal lesions or hydronephrosis. Abdominal aorta: Normal caliber Other findings: None. ULTRASOUND HEPATIC ELASTOGRAPHY Device: Siemens Helix VTQ Patient position: Oblique Transducer 6C1 Number of measurements: 10 Hepatic segment:  8 Median velocity:   0.84  m/sec IQR: 0.18 IQR/Median velocity ratio: 0.21 Corresponding Metavir fibrosis score:  F0/F1 Risk of fibrosis: Minimal Limitations of exam: None Pertinent findings noted on other imaging exams:  None Please note that abnormal shear wave velocities may also be identified in clinical settings other than with hepatic fibrosis, such as: acute hepatitis, elevated right heart and central venous pressures including use of beta blockers, veno-occlusive disease (Budd-Chiari), infiltrative processes such as mastocytosis/amyloidosis/infiltrative tumor, extrahepatic cholestasis, in the post-prandial state, and liver transplantation. Correlation with patient history, laboratory data, and clinical condition recommended. IMPRESSION: ULTRASOUND ABDOMEN:  Unremarkable abdominal ultrasound examination. ULTRASOUND HEPATIC ELASTOGRAPHY: Median hepatic shear wave velocity is calculated at 0.84 m/sec. Corresponding Metavir fibrosis score is  F0/F1. Risk of fibrosis is Minimal. Follow-up: None required Fairly remarkable decrease in median shear velocity when compared to prior study. I assume this patient has been treated? Other possibilities would include that the prior study which showed fibrosis may have been due to  another process such as acute hepatitis which has resolved. Recommend clinical correlation. A six-month follow-up ultrasound may be helpful to reassess. Correlation with liver function studies is also suggested. Electronically Signed   By: Marijo Sanes M.D.   On: 01/08/2017 10:00       Assessment & Plan:   Problem List Items Addressed This Visit    Abdominal pain    Persistent RUQ pain.  Given persistence, will obtain CT scan to evaluate further.        Relevant Orders   CT Abdomen Pelvis Wo Contrast   Anemia, iron deficiency - Primary    Recent hgb wnl.  Follow.        Relevant Orders   CBC with Differential/Platelet   Ferritin   CKD (chronic kidney disease) stage 3, GFR 30-59 ml/min (HCC)    Followed by nephrology.  Renal function stable - 30.        Fatty liver    Saw GI.  Abdominal ultrasound as outlined.  Fibrosis risk - minimal.  Recommended f/u 6 month ultrasound.  Given pain, will obtain CT scan as outlined.        GERD (gastroesophageal reflux disease)    Controlled on current medication regimen.  Follow.        Health care maintenance    Physical today 06/03/17.  Mammogram 10/28/16 - Birads I.  Colonoscopy 06/2011.  Recommended f/u colonoscopy 06/2021.        Hypercholesterolemia    On lipitor.  Low cholesterol diet and exercise.  Follow lipid panel and liver function tests.        Hypertension (Chronic)    Blood pressure under good control.  Continue same medication regimen.  Follow pressures.  Follow metabolic  panel.        Hypothyroidism (Chronic)    On thyroid replacement.  Follow tsh.       Low back pain    Persistent low back pain with pain radiating down right leg.  Request referral to Dr Sharlet Salina.        Relevant Orders   Ambulatory referral to Orthopedic Surgery    Other Visit Diagnoses    Nonintractable headache, unspecified chronicity pattern, unspecified headache type       Persistent head pain/sinus pressure.  has tried antihistamines, nasal sprays, etc.  Request referral to ENT for evaluation.     Relevant Orders   Ambulatory referral to ENT   Sedimentation rate       Einar Pheasant, MD

## 2017-06-06 ENCOUNTER — Encounter: Payer: Self-pay | Admitting: Internal Medicine

## 2017-06-06 DIAGNOSIS — M545 Low back pain, unspecified: Secondary | ICD-10-CM | POA: Insufficient documentation

## 2017-06-06 NOTE — Assessment & Plan Note (Signed)
Persistent RUQ pain.  Given persistence, will obtain CT scan to evaluate further.

## 2017-06-06 NOTE — Assessment & Plan Note (Signed)
Persistent low back pain with pain radiating down right leg.  Request referral to Dr Sharlet Salina.

## 2017-06-06 NOTE — Assessment & Plan Note (Signed)
On thyroid replacement.  Follow tsh.  

## 2017-06-06 NOTE — Assessment & Plan Note (Signed)
Controlled on current medication regimen.  Follow.

## 2017-06-06 NOTE — Assessment & Plan Note (Signed)
Followed by nephrology.  Renal function stable - 30.

## 2017-06-06 NOTE — Assessment & Plan Note (Signed)
Recent hgb wnl.  Follow.   

## 2017-06-06 NOTE — Assessment & Plan Note (Signed)
Blood pressure under good control.  Continue same medication regimen.  Follow pressures.  Follow metabolic panel.   

## 2017-06-06 NOTE — Assessment & Plan Note (Signed)
Saw GI.  Abdominal ultrasound as outlined.  Fibrosis risk - minimal.  Recommended f/u 6 month ultrasound.  Given pain, will obtain CT scan as outlined.

## 2017-06-06 NOTE — Assessment & Plan Note (Signed)
On lipitor.  Low cholesterol diet and exercise.  Follow lipid panel and liver function tests.   

## 2017-06-09 ENCOUNTER — Other Ambulatory Visit (INDEPENDENT_AMBULATORY_CARE_PROVIDER_SITE_OTHER): Payer: PPO

## 2017-06-09 DIAGNOSIS — R51 Headache: Secondary | ICD-10-CM

## 2017-06-09 DIAGNOSIS — D509 Iron deficiency anemia, unspecified: Secondary | ICD-10-CM | POA: Diagnosis not present

## 2017-06-09 DIAGNOSIS — R519 Headache, unspecified: Secondary | ICD-10-CM

## 2017-06-09 LAB — CBC WITH DIFFERENTIAL/PLATELET
BASOS ABS: 0 10*3/uL (ref 0.0–0.1)
BASOS PCT: 0.7 % (ref 0.0–3.0)
EOS ABS: 0.1 10*3/uL (ref 0.0–0.7)
Eosinophils Relative: 1.7 % (ref 0.0–5.0)
HEMATOCRIT: 38.5 % (ref 36.0–46.0)
HEMOGLOBIN: 12.9 g/dL (ref 12.0–15.0)
LYMPHS PCT: 42.9 % (ref 12.0–46.0)
Lymphs Abs: 2 10*3/uL (ref 0.7–4.0)
MCHC: 33.6 g/dL (ref 30.0–36.0)
MCV: 87.3 fl (ref 78.0–100.0)
Monocytes Absolute: 0.6 10*3/uL (ref 0.1–1.0)
Monocytes Relative: 12.8 % — ABNORMAL HIGH (ref 3.0–12.0)
Neutro Abs: 2 10*3/uL (ref 1.4–7.7)
Neutrophils Relative %: 41.9 % — ABNORMAL LOW (ref 43.0–77.0)
Platelets: 199 10*3/uL (ref 150.0–400.0)
RBC: 4.41 Mil/uL (ref 3.87–5.11)
RDW: 13.7 % (ref 11.5–15.5)
WBC: 4.7 10*3/uL (ref 4.0–10.5)

## 2017-06-09 LAB — SEDIMENTATION RATE: SED RATE: 4 mm/h (ref 0–30)

## 2017-06-09 LAB — FERRITIN: FERRITIN: 84.6 ng/mL (ref 10.0–291.0)

## 2017-06-14 ENCOUNTER — Other Ambulatory Visit: Payer: Self-pay | Admitting: Internal Medicine

## 2017-06-15 ENCOUNTER — Ambulatory Visit
Admission: RE | Admit: 2017-06-15 | Discharge: 2017-06-15 | Disposition: A | Discharge status: Home / Self Care | Payer: PPO | Source: Ambulatory Visit | Attending: Internal Medicine | Admitting: Internal Medicine

## 2017-06-15 DIAGNOSIS — K449 Diaphragmatic hernia without obstruction or gangrene: Secondary | ICD-10-CM | POA: Diagnosis not present

## 2017-06-15 DIAGNOSIS — K5790 Diverticulosis of intestine, part unspecified, without perforation or abscess without bleeding: Secondary | ICD-10-CM | POA: Insufficient documentation

## 2017-06-15 DIAGNOSIS — M419 Scoliosis, unspecified: Secondary | ICD-10-CM | POA: Diagnosis not present

## 2017-06-15 DIAGNOSIS — R932 Abnormal findings on diagnostic imaging of liver and biliary tract: Secondary | ICD-10-CM | POA: Diagnosis not present

## 2017-06-15 DIAGNOSIS — J449 Chronic obstructive pulmonary disease, unspecified: Secondary | ICD-10-CM | POA: Diagnosis not present

## 2017-06-15 DIAGNOSIS — N8110 Cystocele, unspecified: Secondary | ICD-10-CM | POA: Diagnosis not present

## 2017-06-15 DIAGNOSIS — R1084 Generalized abdominal pain: Secondary | ICD-10-CM

## 2017-06-16 ENCOUNTER — Encounter: Payer: Self-pay | Admitting: Internal Medicine

## 2017-06-16 DIAGNOSIS — I700 Atherosclerosis of aorta: Secondary | ICD-10-CM | POA: Insufficient documentation

## 2017-06-18 DIAGNOSIS — N2581 Secondary hyperparathyroidism of renal origin: Secondary | ICD-10-CM | POA: Diagnosis not present

## 2017-06-18 DIAGNOSIS — I10 Essential (primary) hypertension: Secondary | ICD-10-CM | POA: Diagnosis not present

## 2017-06-18 DIAGNOSIS — N183 Chronic kidney disease, stage 3 (moderate): Secondary | ICD-10-CM | POA: Diagnosis not present

## 2017-06-18 DIAGNOSIS — R809 Proteinuria, unspecified: Secondary | ICD-10-CM | POA: Diagnosis not present

## 2017-06-21 DIAGNOSIS — R51 Headache: Secondary | ICD-10-CM | POA: Diagnosis not present

## 2017-06-21 DIAGNOSIS — R040 Epistaxis: Secondary | ICD-10-CM | POA: Diagnosis not present

## 2017-06-25 DIAGNOSIS — N2581 Secondary hyperparathyroidism of renal origin: Secondary | ICD-10-CM | POA: Diagnosis not present

## 2017-06-25 DIAGNOSIS — E875 Hyperkalemia: Secondary | ICD-10-CM | POA: Diagnosis not present

## 2017-06-25 DIAGNOSIS — R809 Proteinuria, unspecified: Secondary | ICD-10-CM | POA: Diagnosis not present

## 2017-06-25 DIAGNOSIS — I10 Essential (primary) hypertension: Secondary | ICD-10-CM | POA: Diagnosis not present

## 2017-06-25 DIAGNOSIS — N183 Chronic kidney disease, stage 3 (moderate): Secondary | ICD-10-CM | POA: Diagnosis not present

## 2017-07-06 ENCOUNTER — Other Ambulatory Visit: Payer: Self-pay | Admitting: Gastroenterology

## 2017-07-06 DIAGNOSIS — R110 Nausea: Secondary | ICD-10-CM

## 2017-07-06 DIAGNOSIS — R1011 Right upper quadrant pain: Secondary | ICD-10-CM | POA: Diagnosis not present

## 2017-07-06 DIAGNOSIS — K219 Gastro-esophageal reflux disease without esophagitis: Secondary | ICD-10-CM | POA: Diagnosis not present

## 2017-07-06 DIAGNOSIS — K5900 Constipation, unspecified: Secondary | ICD-10-CM | POA: Diagnosis not present

## 2017-07-06 DIAGNOSIS — R197 Diarrhea, unspecified: Secondary | ICD-10-CM | POA: Diagnosis not present

## 2017-07-13 DIAGNOSIS — N183 Chronic kidney disease, stage 3 (moderate): Secondary | ICD-10-CM | POA: Diagnosis not present

## 2017-07-13 DIAGNOSIS — N2581 Secondary hyperparathyroidism of renal origin: Secondary | ICD-10-CM | POA: Diagnosis not present

## 2017-07-13 DIAGNOSIS — E875 Hyperkalemia: Secondary | ICD-10-CM | POA: Diagnosis not present

## 2017-07-13 DIAGNOSIS — I10 Essential (primary) hypertension: Secondary | ICD-10-CM | POA: Diagnosis not present

## 2017-07-14 DIAGNOSIS — Z23 Encounter for immunization: Secondary | ICD-10-CM | POA: Diagnosis not present

## 2017-07-20 ENCOUNTER — Encounter
Admission: RE | Admit: 2017-07-20 | Discharge: 2017-07-20 | Disposition: A | Discharge status: Home / Self Care | Payer: PPO | Source: Ambulatory Visit | Attending: Gastroenterology | Admitting: Gastroenterology

## 2017-07-20 ENCOUNTER — Ambulatory Visit
Admission: RE | Admit: 2017-07-20 | Discharge: 2017-07-20 | Disposition: A | Discharge status: Home / Self Care | Payer: PPO | Source: Ambulatory Visit | Attending: Gastroenterology | Admitting: Gastroenterology

## 2017-07-20 DIAGNOSIS — K219 Gastro-esophageal reflux disease without esophagitis: Secondary | ICD-10-CM | POA: Diagnosis not present

## 2017-07-20 DIAGNOSIS — R110 Nausea: Secondary | ICD-10-CM | POA: Insufficient documentation

## 2017-07-20 DIAGNOSIS — R1011 Right upper quadrant pain: Secondary | ICD-10-CM | POA: Insufficient documentation

## 2017-07-20 MED ORDER — TECHNETIUM TC 99M MEBROFENIN IV KIT
5.3000 | PACK | Freq: Once | INTRAVENOUS | Status: AC | PRN
  Administered 2017-07-20: 5.3 via INTRAVENOUS

## 2017-08-10 ENCOUNTER — Ambulatory Visit: Payer: Self-pay | Admitting: Internal Medicine

## 2017-08-17 DIAGNOSIS — Z23 Encounter for immunization: Secondary | ICD-10-CM | POA: Diagnosis not present

## 2017-08-19 ENCOUNTER — Encounter: Payer: Self-pay | Admitting: Internal Medicine

## 2017-08-19 ENCOUNTER — Ambulatory Visit (INDEPENDENT_AMBULATORY_CARE_PROVIDER_SITE_OTHER): Payer: PPO | Admitting: Internal Medicine

## 2017-08-19 VITALS — BP 120/72 | HR 79 | Temp 98.9°F | Resp 18 | Wt 157.0 lb

## 2017-08-19 DIAGNOSIS — N183 Chronic kidney disease, stage 3 unspecified: Secondary | ICD-10-CM

## 2017-08-19 DIAGNOSIS — R1084 Generalized abdominal pain: Secondary | ICD-10-CM

## 2017-08-19 DIAGNOSIS — E039 Hypothyroidism, unspecified: Secondary | ICD-10-CM

## 2017-08-19 DIAGNOSIS — K760 Fatty (change of) liver, not elsewhere classified: Secondary | ICD-10-CM

## 2017-08-19 DIAGNOSIS — Z1231 Encounter for screening mammogram for malignant neoplasm of breast: Secondary | ICD-10-CM

## 2017-08-19 DIAGNOSIS — E7800 Pure hypercholesterolemia, unspecified: Secondary | ICD-10-CM

## 2017-08-19 DIAGNOSIS — I10 Essential (primary) hypertension: Secondary | ICD-10-CM

## 2017-08-19 DIAGNOSIS — R739 Hyperglycemia, unspecified: Secondary | ICD-10-CM

## 2017-08-19 DIAGNOSIS — I700 Atherosclerosis of aorta: Secondary | ICD-10-CM

## 2017-08-19 DIAGNOSIS — D509 Iron deficiency anemia, unspecified: Secondary | ICD-10-CM | POA: Diagnosis not present

## 2017-08-19 DIAGNOSIS — Z1239 Encounter for other screening for malignant neoplasm of breast: Secondary | ICD-10-CM

## 2017-08-19 NOTE — Progress Notes (Signed)
Patient ID: Caitlin Lin, female   DOB: Nov 12, 1940, 77 y.o.   MRN: [id number]   Subjective:    Patient ID: Caitlin Lin, female    DOB: Sep 16, 1940, 77 y.o.   MRN: [id number]  HPI  Patient here for a scheduled follow up.  She is followed by nephrology for CKD.  Last seen 06/18/17.  Kidney function stable.  Blood pressure a little elevated (035 systolic) at that visit.  No changes made.  Recommended f/u in 4 months.  She also saw ENT 06/21/17 for headache.  Headache had resolved when she saw them.  No further intervention felt warranted at that time, but recommended CT if headache returns.  Nose bleed stopped when she stopped using flonase.  She also saw GI 07/06/17.  Negative CT.  HIDA ok.  Recommended q 6 month LFTs and f/u in 6 months.  Overall she feels she is doing well.  Eating.  Avoiding fried foods.  Taking metamucil.  Bowels doing better. No abdominal pain.  No nausea or vomiting.  No chest pain.  No sob. No acid reflux.     Past Medical History:  Diagnosis Date  . Chronic kidney disease    Followed by Dr. Holley Raring  . Constipation due to slow transit   . Diarrhea   . Hyperlipidemia   . Hypertension   . Hypothyroidism   . Melanoma of lower leg ([place]) 2013  . Shingles 2013  . Shingles   . Spinal stenosis in cervical region    Dr. Sharlet Salina  . Subdural hematoma [facility])    Past Surgical History:  Procedure Laterality Date  . ABDOMINAL HYSTERECTOMY  1974  . ANTERIOR AND POSTERIOR VAGINAL REPAIR    . ANTERIOR AND POSTERIOR VAGINAL REPAIR W/ SACROSPINOUS LIGAMENT SUSPENSION Right   . BREAST BIOPSY  1963   Benign per pt's report  . COLONOSCOPY    . ESOPHAGOGASTRODUODENOSCOPY    . ESOPHAGOGASTRODUODENOSCOPY (EGD) WITH PROPOFOL N/A 11/10/2016   Procedure: ESOPHAGOGASTRODUODENOSCOPY (EGD) WITH PROPOFOL;  Surgeon: Lollie Sails, MD;  Location: [facility] ENDOSCOPY;  Service: Endoscopy;  Laterality: N/A;  . LUNG BIOPSY  2007   Benign per pt's report  . LUNG BIOPSY Right   . OOPHORECTOMY  1970   . PERINEOPLASTY  12/17/2013  . sling for stress incontinence  12/18/2011   Family History  Problem Relation Age of Onset  . Arthritis Mother   . Liver disease Mother   . Cirrhosis Mother   . Heart disease Father   . Heart attack Father   . Depression Brother   . Breast cancer Other   . Breast cancer Cousin        maternal cousins   Social History   Socioeconomic History  . Marital status: Married    Spouse name: Not on file  . Number of children: 2  . Years of education: Not on file  . Highest education level: Not on file  Occupational History  . Occupation: Retired  Scientific laboratory technician  . Financial resource strain: Not on file  . Food insecurity:    Worry: Not on file    Inability: Not on file  . Transportation needs:    Medical: Not on file    Non-medical: Not on file  Tobacco Use  . Smoking status: Never Smoker  . Smokeless tobacco: Never Used  Substance and Sexual Activity  . Alcohol use: No  . Drug use: No  . Sexual activity: Yes  Lifestyle  . Physical activity:    Days  per week: Not on file    Minutes per session: Not on file  . Stress: Not on file  Relationships  . Social connections:    Talks on phone: Not on file    Gets together: Not on file    Attends religious service: Not on file    Active member of club or organization: Not on file    Attends meetings of clubs or organizations: Not on file    Relationship status: Not on file  Other Topics Concern  . Not on file  Social History Narrative   Lives in [place] with husband. Has 2 children boy and girl, 4 grandchildren. Retired Presenter, broadcasting.       Daily Caffeine Use:  NO   Regular Exercise -  Not at this time due to back pain, would like to resume soon    Outpatient Encounter Medications as of 08/19/2017  Medication Sig  . amLODipine (NORVASC) 2.5 MG tablet Take 1 tablet (2.5 mg total) by mouth daily.  Marland Kitchen aspirin 81 MG tablet Take 1 tablet (81 mg total) by mouth daily.  . DULoxetine (CYMBALTA) 60 MG  capsule TAKE 1 CAPSULE BY MOUTH ONCE DAILY  . fexofenadine-pseudoephedrine (ALLEGRA-D 24) 180-240 MG 24 hr tablet Take 1 tablet by mouth daily as needed.  Marland Kitchen glucosamine-chondroitin 500-400 MG tablet Take 1 tablet by mouth 3 (three) times daily.  Marland Kitchen levothyroxine (SYNTHROID, LEVOTHROID) 75 MCG tablet TAKE 1 TABLET EVERY DAY BEFORE BREAKFAST  . losartan (COZAAR) 50 MG tablet Take 1 tablet (50 mg total) by mouth daily.  . Omega-3 Fatty Acids (FISH OIL) 1000 MG CAPS Take 1,000 mg by mouth daily.  . [DISCONTINUED] fluticasone (FLONASE) 50 MCG/ACT nasal spray   . [DISCONTINUED] pantoprazole (PROTONIX) 40 MG tablet    No facility-administered encounter medications on file as of 08/19/2017.     Review of Systems  Constitutional: Negative for appetite change and unexpected weight change.  HENT: Negative for congestion and sinus pressure.   Respiratory: Negative for cough, chest tightness and shortness of breath.   Cardiovascular: Negative for chest pain, palpitations and leg swelling.  Gastrointestinal: Negative for abdominal pain, diarrhea, nausea and vomiting.  Genitourinary: Negative for difficulty urinating and dysuria.  Musculoskeletal: Negative for joint swelling and myalgias.  Skin: Negative for color change and rash.  Neurological: Negative for dizziness, light-headedness and headaches.  Psychiatric/Behavioral: Negative for agitation and dysphoric mood.       Objective:     Blood pressure rechecked by me:  134/80  Physical Exam  Constitutional: She appears well-developed and well-nourished. No distress.  HENT:  Nose: Nose normal.  Mouth/Throat: Oropharynx is clear and moist.  Neck: Neck supple. No thyromegaly present.  Cardiovascular: Normal rate and regular rhythm.  Pulmonary/Chest: Breath sounds normal. No respiratory distress. She has no wheezes.  Abdominal: Soft. Bowel sounds are normal. There is no tenderness.  Musculoskeletal: She exhibits no edema or tenderness.    Lymphadenopathy:    She has no cervical adenopathy.  Skin: No rash noted. No erythema.  Psychiatric: She has a normal mood and affect. Her behavior is normal.    BP 120/72 (BP Location: Left Arm, Patient Position: Sitting, Cuff Size: Normal)   Pulse 79   Temp 98.9 F (37.2 C) (Oral)   Resp 18   Wt 157 lb (71.2 kg)   SpO2 97%   BMI 27.81 kg/m  Wt Readings from Last 3 Encounters:  08/19/17 157 lb (71.2 kg)  06/03/17 156 lb 3.2 oz (70.9 kg)  02/10/17 154 lb 9.6 oz (70.1 kg)     Lab Results  Component Value Date   WBC 4.7 06/09/2017   HGB 12.9 06/09/2017   HCT 38.5 06/09/2017   PLT 199.0 06/09/2017   GLUCOSE 107 (H) 06/01/2017   CHOL 165 06/01/2017   TRIG 177.0 (H) 06/01/2017   HDL 42.10 06/01/2017   LDLDIRECT 88.0 02/02/2017   LDLCALC 88 06/01/2017   ALT 16 06/01/2017   AST 20 06/01/2017   NA 140 06/01/2017   K 4.8 06/01/2017   CL 106 06/01/2017   CREATININE 1.77 (H) 06/01/2017   BUN 31 (H) 06/01/2017   CO2 28 06/01/2017   TSH 1.61 06/01/2017   MICROALBUR 2.1 (H) 03/27/2015    Nm Hepato W/eject Fract  Result Date: 07/20/2017 CLINICAL DATA:  Right upper quadrant pain and nausea. EXAM: NUCLEAR MEDICINE HEPATOBILIARY IMAGING WITH GALLBLADDER EF TECHNIQUE: Sequential images of the abdomen were obtained out to 60 minutes following intravenous administration of radiopharmaceutical. After oral ingestion of Ensure, gallbladder ejection fraction was determined. At 60 min, normal ejection fraction is greater than 33%. RADIOPHARMACEUTICALS:  5.3 mCi Tc-68m Choletec IV COMPARISON:  None. FINDINGS: Prompt uptake and biliary excretion of activity by the liver is seen. Gallbladder activity is visualized, consistent with patency of cystic duct. Biliary activity passes into small bowel, consistent with patent common bile duct. Calculated gallbladder ejection fraction is 52%. (Normal gallbladder ejection fraction with Ensure is greater than 33%.) IMPRESSION: Normal study.  Normal  gallbladder ejection fraction. Electronically Signed   By: DDorise BullionIII M.D   On: 07/20/2017 13:09   UKoreaAbdomen Limited Ruq  Result Date: 07/20/2017 CLINICAL DATA:  Right upper quadrant pain. EXAM: ULTRASOUND ABDOMEN LIMITED RIGHT UPPER QUADRANT COMPARISON:  None. FINDINGS: Gallbladder: No gallstones or wall thickening visualized. No sonographic Murphy sign noted by sonographer. Common bile duct: Diameter: 3.6 mm Liver: No focal lesion identified. Within normal limits in parenchymal echogenicity. Portal vein is patent on color Doppler imaging with normal direction of blood flow towards the liver. IMPRESSION: Normal study. Electronically Signed   By: DDorise BullionIII M.D   On: 07/20/2017 13:10       Assessment & Plan:   Problem List Items Addressed This Visit    Abdominal pain    Extensive w/up by GI as outlined.  Doing well now.  Has adjusted diet.  Taking metamucil to keep bowels regular.  No pain now.  Recommended q 6 month LFTs and f/u in 6 months.        Anemia, iron deficiency    Follow cbc.        Aortic atherosclerosis (HCC)    Not on cholesterol medication.        CKD (chronic kidney disease) stage 3, GFR 30-59 ml/min (HCC)    Followed by nephrology.  Stable.  Last evaluated 06/2017.  Recommended f/u in 4 months.        Fatty liver    Saw GI.  Abdominal ultrasound as outlined.  Fibrosis risk- minimal.  Recommended f/u in 6 months.  Follow LFTs as outlined.        Hypercholesterolemia    Appears to be off lipitor.  Check lipid panel.  Consider adding back statin.        Relevant Orders   Hepatic function panel   Lipid panel   Hypertension (Chronic)    Blood pressure under good control.  Continue same medication regimen.  Follow pressures.  Follow metabolic panel.  Relevant Orders   Basic metabolic panel   Hypothyroidism (Chronic)    On thyroid replacement.  Follow tsh.        Other Visit Diagnoses    Breast cancer screening    -  Primary    Relevant Orders   MM 3D SCREEN BREAST BILATERAL   Hyperglycemia       Check met b and a1c with next labs.     Relevant Orders   Hemoglobin A1c       Einar Pheasant, MD

## 2017-08-22 ENCOUNTER — Encounter: Payer: Self-pay | Admitting: Internal Medicine

## 2017-08-22 NOTE — Assessment & Plan Note (Signed)
Follow cbc.  

## 2017-08-22 NOTE — Assessment & Plan Note (Signed)
Extensive w/up by GI as outlined.  Doing well now.  Has adjusted diet.  Taking metamucil to keep bowels regular.  No pain now.  Recommended q 6 month LFTs and f/u in 6 months.

## 2017-08-22 NOTE — Assessment & Plan Note (Signed)
Blood pressure under good control.  Continue same medication regimen.  Follow pressures.  Follow metabolic panel.   

## 2017-08-22 NOTE — Assessment & Plan Note (Signed)
Not on cholesterol medication.

## 2017-08-22 NOTE — Assessment & Plan Note (Signed)
On thyroid replacement.  Follow tsh.  

## 2017-08-22 NOTE — Assessment & Plan Note (Signed)
Appears to be off lipitor.  Check lipid panel.  Consider adding back statin.

## 2017-08-22 NOTE — Assessment & Plan Note (Signed)
Saw GI.  Abdominal ultrasound as outlined.  Fibrosis risk- minimal.  Recommended f/u in 6 months.  Follow LFTs as outlined.

## 2017-08-22 NOTE — Assessment & Plan Note (Signed)
Followed by nephrology.  Stable.  Last evaluated 06/2017.  Recommended f/u in 4 months.

## 2017-09-17 ENCOUNTER — Ambulatory Visit (INDEPENDENT_AMBULATORY_CARE_PROVIDER_SITE_OTHER): Payer: PPO

## 2017-09-17 VITALS — BP 127/60 | HR 72 | Temp 98.7°F | Resp 15 | Ht 63.0 in | Wt 153.8 lb

## 2017-09-17 DIAGNOSIS — Z0000 Encounter for general adult medical examination without abnormal findings: Secondary | ICD-10-CM

## 2017-09-17 NOTE — Patient Instructions (Addendum)
  Ms. Bermea , Thank you for taking time to come for your Medicare Wellness Visit. I appreciate your ongoing commitment to your health goals. Please review the following plan we discussed and let me know if I can assist you in the future.   These are the goals we discussed: Goals    . Increase physical activity     Chair exercise, daily Walk for exercise as tolerated       This is a list of the screening recommended for you and due dates:  Health Maintenance  Topic Date Due  . Flu Shot  10/14/2017  . Mammogram  10/28/2017  . Tetanus Vaccine  03/07/2022  . DEXA scan (bone density measurement)  Completed  . Pneumonia vaccines  Completed

## 2017-09-17 NOTE — Progress Notes (Addendum)
Subjective:   Caitlin Lin is a 77 y.o. female who presents for Medicare Annual (Subsequent) preventive examination.  Review of Systems:  No ROS.  Medicare Wellness Visit. Additional risk factors are reflected in the social history. Cardiac Risk Factors include: advanced age (>81men, >53 women);hypertension     Objective:     Vitals: BP 127/60 (BP Location: Left Arm, Patient Position: Sitting, Cuff Size: Normal)   Pulse 72   Temp 98.7 F (37.1 C) (Oral)   Resp 15   Ht 5\' 3"  (1.6 m)   Wt 153 lb 12.8 oz (69.8 kg)   SpO2 96%   BMI 27.24 kg/m   Body mass index is 27.24 kg/m.  Advanced Directives 09/17/2017 11/10/2016 09/24/2014  Does Patient Have a Medical Advance Directive? Yes Yes Yes  Type of Paramedic of [place];Living will Living will [place];Living will  Does patient want to make changes to medical advance directive? No - Patient declined - -  Copy of [place] in Chart? Yes - No - copy requested    Tobacco Social History   Tobacco Use  Smoking Status Never Smoker  Smokeless Tobacco Never Used     Counseling given: Not Answered   Clinical Intake:  Pre-visit preparation completed: Yes  Pain : 0-10 Pain Score: 5  Pain Location: Back Pain Radiating Towards: knees Pain Descriptors / Indicators: Aching Pain Onset: More than a month ago Pain Relieving Factors: injection to the site.  Effect of Pain on Daily Activities: self pace.  Pain Relieving Factors: injection to the site.   Nutritional Status: BMI 25 -29 Overweight Diabetes: No  How often do you need to have someone help you when you read instructions, pamphlets, or other written materials from your doctor or pharmacy?: 1 - Never  Interpreter Needed?: No     Past Medical History:  Diagnosis Date  . Chronic kidney disease    Followed by Dr. Holley Raring  . Constipation due to slow transit   . Diarrhea   . Hyperlipidemia   .  Hypertension   . Hypothyroidism   . Melanoma of lower leg ([place]) 2013  . Shingles 2013  . Shingles   . Spinal stenosis in cervical region    Dr. Sharlet Salina  . Subdural hematoma [facility])    Past Surgical History:  Procedure Laterality Date  . ABDOMINAL HYSTERECTOMY  1974  . ANTERIOR AND POSTERIOR VAGINAL REPAIR    . ANTERIOR AND POSTERIOR VAGINAL REPAIR W/ SACROSPINOUS LIGAMENT SUSPENSION Right   . BREAST BIOPSY  1963   Benign per pt's report  . COLONOSCOPY    . ESOPHAGOGASTRODUODENOSCOPY    . ESOPHAGOGASTRODUODENOSCOPY (EGD) WITH PROPOFOL N/A 11/10/2016   Procedure: ESOPHAGOGASTRODUODENOSCOPY (EGD) WITH PROPOFOL;  Surgeon: Lollie Sails, MD;  [facility] ENDOSCOPY;  Service: Endoscopy;  Laterality: N/A;  . LUNG BIOPSY  2007   Benign per pt's report  . LUNG BIOPSY Right   . OOPHORECTOMY  1970  . PERINEOPLASTY  12/17/2013  . sling for stress incontinence  12/18/2011   Family History  Problem Relation Age of Onset  . Arthritis Mother   . Liver disease Mother   . Cirrhosis Mother   . Heart disease Father   . Heart attack Father   . Depression Brother   . Breast cancer Other   . Breast cancer Cousin        maternal cousins   Social History   Socioeconomic History  . Marital status: Married  Spouse name: Not on file  . Number of children: 2  . Years of education: Not on file  . Highest education level: Not on file  Occupational History  . Occupation: Retired  Scientific laboratory technician  . Financial resource strain: Not hard at all  . Food insecurity:    Worry: Never true    Inability: Never true  . Transportation needs:    Medical: No    Non-medical: No  Tobacco Use  . Smoking status: Never Smoker  . Smokeless tobacco: Never Used  Substance and Sexual Activity  . Alcohol use: No  . Drug use: No  . Sexual activity: Yes  Lifestyle  . Physical activity:    Days per week: 0 days    Minutes per session: 0 min  . Stress: Not at all  Relationships  . Social connections:      Talks on phone: Not on file    Gets together: Not on file    Attends religious service: Not on file    Active member of club or organization: Not on file    Attends meetings of clubs or organizations: Not on file    Relationship status: Not on file  Other Topics Concern  . Not on file  Social History Narrative   Lives in [place] with husband. Has 2 children boy and girl, 4 grandchildren. Retired Presenter, broadcasting.       Daily Caffeine Use:  NO   Regular Exercise -  Not at this time due to back pain, would like to resume soon    Outpatient Encounter Medications as of 09/17/2017  Medication Sig  . amLODipine (NORVASC) 2.5 MG tablet Take 1 tablet (2.5 mg total) by mouth daily.  Marland Kitchen aspirin 81 MG tablet Take 1 tablet (81 mg total) by mouth daily.  Marland Kitchen atorvastatin (LIPITOR) 20 MG tablet Take 20 mg by mouth daily.  Marland Kitchen CRANBERRY-CALCIUM PO Take 1 tablet by mouth.  Mariane Baumgarten Sodium (COLACE PO) Take 2 capsules by mouth daily.  . DULoxetine (CYMBALTA) 60 MG capsule TAKE 1 CAPSULE BY MOUTH ONCE DAILY  . fexofenadine-pseudoephedrine (ALLEGRA-D 24) 180-240 MG 24 hr tablet Take 1 tablet by mouth daily as needed.  Marland Kitchen glucosamine-chondroitin 500-400 MG tablet Take 1 tablet by mouth 3 (three) times daily.  Marland Kitchen levothyroxine (SYNTHROID, LEVOTHROID) 75 MCG tablet TAKE 1 TABLET EVERY DAY BEFORE BREAKFAST  . losartan (COZAAR) 50 MG tablet Take 1 tablet (50 mg total) by mouth daily.  . Multiple Vitamin (MULTIVITAMIN) tablet Take 1 tablet by mouth daily.  . Omega-3 Fatty Acids (FISH OIL) 1000 MG CAPS Take 1,000 mg by mouth daily.  . pantoprazole (PROTONIX) 40 MG tablet Take 40 mg by mouth daily.   No facility-administered encounter medications on file as of 09/17/2017.     Activities of Daily Living In your present state of health, do you have any difficulty performing the following activities: 09/17/2017  Hearing? N  Vision? N  Difficulty concentrating or making decisions? N  Walking or climbing stairs? Y   Comment Chronic knee pain  Dressing or bathing? N  Doing errands, shopping? N  Preparing Food and eating ? N  Using the Toilet? N  In the past six months, have you accidently leaked urine? Y  Comment Followed by Dr. Holley Raring and pcp.  Managed with a daily brief.   Do you have problems with loss of bowel control? Y  Comment Followed by GI and pcp.    Managing your Medications? N  Managing your Finances?  N  Housekeeping or managing your Housekeeping? N  Some recent data might be hidden    Patient Care Team: Einar Pheasant, MD as PCP - General (Internal Medicine) Anthonette Legato, MD (Internal Medicine)    Assessment:   This is a routine wellness examination for Sh[place].  The goal of the wellness visit is to assist the patient how to close the gaps in care and create a preventative care plan for the patient.   The roster of all physicians providing medical care to patient is listed in the Snapshot section of the chart.  Osteoporosis risk reviewed.    Safety issues reviewed; Smoke and carbon monoxide detectors in the home. No firearms in the home. Wears seatbelts when driving or riding with others. No violence in the home.  They do not have excessive sun exposure.  Discussed the need for sun protection: hats, long sleeves and the use of sunscreen if there is significant sun exposure.  Patient is alert, normal appearance, oriented to person/place/and time. Correctly identified the president of the [place] and recalls of 3/3 words.Performs simple calculations and can read correct time from watch face. Displays appropriate judgement.  No new identified risk were noted.  No failures at ADL's or IADL's.    BMI- discussed the importance of a healthy diet, water intake and the benefits of aerobic exercise. Educational material provided.   24 hour diet recall: Regular diet  Dental- every 12 months.  Dr. Eartha Inch.   Eye- UTD  Sleep patterns- Sleeps 8-9 hours at night.  Wakes  feeling rested.   Health maintenance gaps- closed.  Patient Concerns: None at this time. Follow up with PCP as needed.  Exercise Activities and Dietary recommendations Current Exercise Habits: The patient does not participate in regular exercise at present  Goals    . Increase physical activity     Chair exercise, daily Walk for exercise as tolerated       Fall Risk Fall Risk  09/17/2017 02/10/2017 02/04/2016 09/24/2014 09/08/2013  Falls in the past year? No No No Yes Yes  Number falls in past yr: - - - 1 1  Injury with Fall? - - - Yes No  Comment - - - Hurt left knee. Did not seek medical attention. Bruising only. -  Risk for fall due to : - - - Other (Comment) Impaired balance/gait  Risk for fall due to: Comment - - - Tripped. -  Follow up - - - Falls evaluation completed;Falls prevention discussed -   Depression Screen PHQ 2/9 Scores 09/17/2017 06/03/2017 02/10/2017 02/04/2016  PHQ - 2 Score 0 0 0 0  PHQ- 9 Score - 4 - -     Cognitive Function MMSE - Mini Mental State Exam 09/24/2014 09/24/2014  Orientation to time 5 5  Orientation to Place 5 -  Registration 3 3  Attention/ Calculation 5 -  Recall 3 3  Language- name 2 objects 2 -  Language- repeat 1 1  Language- follow 3 step command 3 -  Language- read & follow direction 1 1  Write a sentence 1 -  Copy design 1 1  Total score 30 -     6CIT Screen 09/17/2017  What Year? 0 points  What month? 0 points  What time? 0 points  Count back from 20 0 points  Months in reverse 0 points  Repeat phrase 0 points  Total Score 0    Immunization History  Administered Date(s) Administered  . Hepatitis A 07/14/2017, 08/17/2017  .  Hepatitis B 07/14/2017, 08/17/2017  . Influenza Split 12/15/2010, 03/07/2012  . Influenza, High Dose Seasonal PF 12/21/2016  . Influenza,inj,Quad PF,6+ Mos 12/05/2013  . Influenza-Unspecified 12/25/2014, 01/03/2016, 12/28/2016  . Pneumococcal Conjugate-13 03/12/2014  . Pneumococcal  Polysaccharide-23 12/09/2005, 03/03/2013  . Tdap 03/07/2012  . Zoster 12/09/2009   Screening Tests Health Maintenance  Topic Date Due  . INFLUENZA VACCINE  10/14/2017  . MAMMOGRAM  10/28/2017  . TETANUS/TDAP  03/07/2022  . DEXA SCAN  Completed  . PNA vac Low Risk Adult  Completed      Plan:   End of life planning; Advance aging; Advanced directives discussed. Copy of current HCPOA/Living Will on file.    I have personally reviewed and noted the following in the patient's chart:   . Medical and social history . Use of alcohol, tobacco or illicit drugs  . Current medications and supplements . Functional ability and status . Nutritional status . Physical activity . Advanced directives . List of other physicians . Hospitalizations, surgeries, and ER visits in previous 12 months . Vitals . Screenings to include cognitive, depression, and falls . Referrals and appointments  In addition, I have reviewed and discussed with patient certain preventive protocols, quality metrics, and best practice recommendations. A written personalized care plan for preventive services as well as general preventive health recommendations were provided to patient.     OBrien-Blaney, Denisa L, LPN  7/0/3500   Reviewed above information.  Agree with assessment and plan.    Dr Nicki Reaper

## 2017-10-01 DIAGNOSIS — M5136 Other intervertebral disc degeneration, lumbar region: Secondary | ICD-10-CM | POA: Diagnosis not present

## 2017-10-01 DIAGNOSIS — M5416 Radiculopathy, lumbar region: Secondary | ICD-10-CM | POA: Diagnosis not present

## 2017-10-04 DIAGNOSIS — Z961 Presence of intraocular lens: Secondary | ICD-10-CM | POA: Diagnosis not present

## 2017-10-14 DIAGNOSIS — N183 Chronic kidney disease, stage 3 (moderate): Secondary | ICD-10-CM | POA: Diagnosis not present

## 2017-10-14 DIAGNOSIS — I10 Essential (primary) hypertension: Secondary | ICD-10-CM | POA: Diagnosis not present

## 2017-10-14 DIAGNOSIS — N2581 Secondary hyperparathyroidism of renal origin: Secondary | ICD-10-CM | POA: Diagnosis not present

## 2017-10-14 DIAGNOSIS — R809 Proteinuria, unspecified: Secondary | ICD-10-CM | POA: Diagnosis not present

## 2017-10-29 ENCOUNTER — Ambulatory Visit
Admission: RE | Admit: 2017-10-29 | Discharge: 2017-10-29 | Disposition: A | Discharge status: Home / Self Care | Payer: PPO | Source: Ambulatory Visit | Attending: Internal Medicine | Admitting: Internal Medicine

## 2017-10-29 ENCOUNTER — Other Ambulatory Visit: Payer: Self-pay | Admitting: Nephrology

## 2017-10-29 DIAGNOSIS — Z1231 Encounter for screening mammogram for malignant neoplasm of breast: Secondary | ICD-10-CM | POA: Diagnosis not present

## 2017-10-29 DIAGNOSIS — N183 Chronic kidney disease, stage 3 unspecified: Secondary | ICD-10-CM

## 2017-10-29 DIAGNOSIS — Z1239 Encounter for other screening for malignant neoplasm of breast: Secondary | ICD-10-CM

## 2017-11-01 DIAGNOSIS — M5416 Radiculopathy, lumbar region: Secondary | ICD-10-CM | POA: Diagnosis not present

## 2017-11-01 DIAGNOSIS — M48062 Spinal stenosis, lumbar region with neurogenic claudication: Secondary | ICD-10-CM | POA: Diagnosis not present

## 2017-11-01 DIAGNOSIS — M5136 Other intervertebral disc degeneration, lumbar region: Secondary | ICD-10-CM | POA: Diagnosis not present

## 2017-11-02 ENCOUNTER — Ambulatory Visit
Admission: RE | Admit: 2017-11-02 | Discharge: 2017-11-02 | Disposition: A | Discharge status: Home / Self Care | Payer: PPO | Source: Ambulatory Visit | Attending: Nephrology | Admitting: Nephrology

## 2017-11-02 DIAGNOSIS — N183 Chronic kidney disease, stage 3 unspecified: Secondary | ICD-10-CM

## 2017-11-02 DIAGNOSIS — N281 Cyst of kidney, acquired: Secondary | ICD-10-CM | POA: Insufficient documentation

## 2017-11-16 ENCOUNTER — Other Ambulatory Visit (INDEPENDENT_AMBULATORY_CARE_PROVIDER_SITE_OTHER): Payer: PPO

## 2017-11-16 DIAGNOSIS — R739 Hyperglycemia, unspecified: Secondary | ICD-10-CM | POA: Diagnosis not present

## 2017-11-16 DIAGNOSIS — E7800 Pure hypercholesterolemia, unspecified: Secondary | ICD-10-CM | POA: Diagnosis not present

## 2017-11-16 DIAGNOSIS — I10 Essential (primary) hypertension: Secondary | ICD-10-CM | POA: Diagnosis not present

## 2017-11-16 LAB — LIPID PANEL
CHOLESTEROL: 164 mg/dL (ref 0–200)
HDL: 40.4 mg/dL (ref >39.00)
LDL CALC: 84 mg/dL (ref 0–99)
NonHDL: 123.84
TRIGLYCERIDES: 197 mg/dL — AB (ref 0.0–149.0)
Total CHOL/HDL Ratio: 4
VLDL: 39.4 mg/dL (ref 0.0–40.0)

## 2017-11-16 LAB — BASIC METABOLIC PANEL
BUN: 34 mg/dL — ABNORMAL HIGH (ref 6–23)
CHLORIDE: 108 meq/L (ref 96–112)
CO2: 23 mEq/L (ref 19–32)
Calcium: 9.7 mg/dL (ref 8.4–10.5)
Creatinine, Ser: 1.74 mg/dL — ABNORMAL HIGH (ref 0.40–1.20)
GFR: 30.19 mL/min — ABNORMAL LOW (ref >60.00)
Glucose, Bld: 113 mg/dL — ABNORMAL HIGH (ref 70–99)
Potassium: 4.6 mEq/L (ref 3.5–5.1)
SODIUM: 141 meq/L (ref 135–145)

## 2017-11-16 LAB — HEPATIC FUNCTION PANEL
ALT: 17 U/L (ref 0–35)
AST: 22 U/L (ref 0–37)
Albumin: 4 g/dL (ref 3.5–5.2)
Alkaline Phosphatase: 86 U/L (ref 39–117)
BILIRUBIN DIRECT: 0.1 mg/dL (ref 0.0–0.3)
TOTAL PROTEIN: 7.1 g/dL (ref 6.0–8.3)
Total Bilirubin: 0.6 mg/dL (ref 0.2–1.2)

## 2017-11-16 LAB — HEMOGLOBIN A1C: Hgb A1c MFr Bld: 5.8 % (ref 4.6–6.5)

## 2017-11-17 ENCOUNTER — Ambulatory Visit (INDEPENDENT_AMBULATORY_CARE_PROVIDER_SITE_OTHER): Payer: PPO | Admitting: Internal Medicine

## 2017-11-17 ENCOUNTER — Encounter: Payer: Self-pay | Admitting: Internal Medicine

## 2017-11-17 ENCOUNTER — Encounter

## 2017-11-17 VITALS — BP 130/78 | HR 79 | Temp 98.4°F | Resp 18 | Wt 158.2 lb

## 2017-11-17 DIAGNOSIS — K760 Fatty (change of) liver, not elsewhere classified: Secondary | ICD-10-CM | POA: Diagnosis not present

## 2017-11-17 DIAGNOSIS — D509 Iron deficiency anemia, unspecified: Secondary | ICD-10-CM

## 2017-11-17 DIAGNOSIS — I10 Essential (primary) hypertension: Secondary | ICD-10-CM | POA: Diagnosis not present

## 2017-11-17 DIAGNOSIS — K219 Gastro-esophageal reflux disease without esophagitis: Secondary | ICD-10-CM | POA: Diagnosis not present

## 2017-11-17 DIAGNOSIS — E039 Hypothyroidism, unspecified: Secondary | ICD-10-CM

## 2017-11-17 DIAGNOSIS — N183 Chronic kidney disease, stage 3 unspecified: Secondary | ICD-10-CM

## 2017-11-17 DIAGNOSIS — M5441 Lumbago with sciatica, right side: Secondary | ICD-10-CM

## 2017-11-17 DIAGNOSIS — R1084 Generalized abdominal pain: Secondary | ICD-10-CM | POA: Diagnosis not present

## 2017-11-17 DIAGNOSIS — I700 Atherosclerosis of aorta: Secondary | ICD-10-CM

## 2017-11-17 DIAGNOSIS — Z23 Encounter for immunization: Secondary | ICD-10-CM | POA: Diagnosis not present

## 2017-11-17 DIAGNOSIS — E7800 Pure hypercholesterolemia, unspecified: Secondary | ICD-10-CM

## 2017-11-17 DIAGNOSIS — R739 Hyperglycemia, unspecified: Secondary | ICD-10-CM | POA: Diagnosis not present

## 2017-11-17 NOTE — Progress Notes (Signed)
Patient ID: Caitlin Lin, female   DOB: [birth year], 77 y.o.   MRN: [id number]   Subjective:    Patient ID: Caitlin Lin, female    DOB: [birth year], 77 y.o.   MRN: [id number]  HPI  Patient here for a scheduled follow up.  Recently evaluated by Dr Sharlet Salina office with acute on chronic back pain.  She is on gabapentin and tramadol.  Tapering cymbalta.  S/p ESI 11/01/17.  Recommended f/u in 3 months.  Is doing better.  She is exercising.  Riding exercise bike.  She has been seeing GI for some RUQ discomfort.  Note reviewed.  Had ultrasound gallbladder - normal.  Normal HIDA.  With cirrhosis - seen on imaging incidentally.  Recommended f/u LFTs in 6 months.  Overall she feels she is doing better.  No significant abdominal pain.  Eating.  No nausea or vomiting.  Bowels moving.  No urine change.  No chest pain.  No sob.  Saw nephrology 10/14/17.  Stable.  Last evaluated 10/14/17.  Recommended f/u in 3 months.     Past Medical History:  Diagnosis Date  . Chronic kidney disease    Followed by Dr. Holley Raring  . Constipation due to slow transit   . Diarrhea   . Hyperlipidemia   . Hypertension   . Hypothyroidism   . Melanoma of lower leg ([place]) 2013  . Shingles 2013  . Shingles   . Spinal stenosis in cervical region    Dr. Sharlet Salina  . Subdural hematoma [facility])    Past Surgical History:  Procedure Laterality Date  . ABDOMINAL HYSTERECTOMY  1974  . ANTERIOR AND POSTERIOR VAGINAL REPAIR    . ANTERIOR AND POSTERIOR VAGINAL REPAIR W/ SACROSPINOUS LIGAMENT SUSPENSION Right   . BREAST BIOPSY  1963   Benign per pt's report  . COLONOSCOPY    . ESOPHAGOGASTRODUODENOSCOPY    . ESOPHAGOGASTRODUODENOSCOPY (EGD) WITH PROPOFOL N/A 11/10/2016   Procedure: ESOPHAGOGASTRODUODENOSCOPY (EGD) WITH PROPOFOL;  Surgeon: Lollie Sails, MD;  Location: [facility] ENDOSCOPY;  Service: Endoscopy;  Laterality: N/A;  . LUNG BIOPSY  2007   Benign per pt's report  . LUNG BIOPSY Right   . OOPHORECTOMY  1970  . PERINEOPLASTY   12/17/2013  . sling for stress incontinence  12/18/2011   Family History  Problem Relation Age of Onset  . Arthritis Mother   . Liver disease Mother   . Cirrhosis Mother   . Heart disease Father   . Heart attack Father   . Depression Brother   . Breast cancer Other   . Breast cancer Cousin        maternal cousins   Social History   Socioeconomic History  . Marital status: Married    Spouse name: Not on file  . Number of children: 2  . Years of education: Not on file  . Highest education level: Not on file  Occupational History  . Occupation: Retired  Scientific laboratory technician  . Financial resource strain: Not hard at all  . Food insecurity:    Worry: Never true    Inability: Never true  . Transportation needs:    Medical: No    Non-medical: No  Tobacco Use  . Smoking status: Never Smoker  . Smokeless tobacco: Never Used  Substance and Sexual Activity  . Alcohol use: No  . Drug use: No  . Sexual activity: Yes  Lifestyle  . Physical activity:    Days per week: 0 days    Minutes per session: 0 min  .  Stress: Not at all  Relationships  . Social connections:    Talks on phone: Not on file    Gets together: Not on file    Attends religious service: Not on file    Active member of club or organization: Not on file    Attends meetings of clubs or organizations: Not on file    Relationship status: Not on file  Other Topics Concern  . Not on file  Social History Narrative   Lives in [place] with husband. Has 2 children boy and girl, 4 grandchildren. Retired Presenter, broadcasting.       Daily Caffeine Use:  NO   Regular Exercise -  Not at this time due to back pain, would like to resume soon    Outpatient Encounter Medications as of 11/17/2017  Medication Sig  . amLODipine (NORVASC) 2.5 MG tablet Take 1 tablet (2.5 mg total) by mouth daily.  Marland Kitchen aspirin 81 MG tablet Take 1 tablet (81 mg total) by mouth daily.  Marland Kitchen atorvastatin (LIPITOR) 20 MG tablet Take 20 mg by mouth daily.  Marland Kitchen  CRANBERRY-CALCIUM PO Take 1 tablet by mouth.  Mariane Baumgarten Sodium (COLACE PO) Take 2 capsules by mouth daily.  . DULoxetine (CYMBALTA) 60 MG capsule TAKE 1 CAPSULE BY MOUTH ONCE DAILY  . fexofenadine-pseudoephedrine (ALLEGRA-D 24) 180-240 MG 24 hr tablet Take 1 tablet by mouth daily as needed.  Marland Kitchen glucosamine-chondroitin 500-400 MG tablet Take 1 tablet by mouth 3 (three) times daily.  Marland Kitchen levothyroxine (SYNTHROID, LEVOTHROID) 75 MCG tablet TAKE 1 TABLET EVERY DAY BEFORE BREAKFAST  . losartan (COZAAR) 50 MG tablet Take 1 tablet (50 mg total) by mouth daily.  . Multiple Vitamin (MULTIVITAMIN) tablet Take 1 tablet by mouth daily.  . Omega-3 Fatty Acids (FISH OIL) 1000 MG CAPS Take 1,000 mg by mouth daily.  . pantoprazole (PROTONIX) 40 MG tablet Take 40 mg by mouth daily.   No facility-administered encounter medications on file as of 11/17/2017.     Review of Systems  Constitutional: Negative for appetite change and unexpected weight change.  HENT: Negative for congestion and sinus pressure.   Respiratory: Negative for cough, chest tightness and shortness of breath.   Cardiovascular: Negative for chest pain, palpitations and leg swelling.  Gastrointestinal: Negative for abdominal pain, diarrhea, nausea and vomiting.  Genitourinary: Negative for difficulty urinating and dysuria.  Musculoskeletal: Negative for joint swelling and myalgias.  Skin: Negative for color change and rash.  Neurological: Negative for dizziness, light-headedness and headaches.  Psychiatric/Behavioral: Negative for agitation and dysphoric mood.       Objective:    Physical Exam  Constitutional: She appears well-developed and well-nourished. No distress.  HENT:  Nose: Nose normal.  Mouth/Throat: Oropharynx is clear and moist.  Neck: Neck supple. No thyromegaly present.  Cardiovascular: Normal rate and regular rhythm.  Pulmonary/Chest: Breath sounds normal. No respiratory distress. She has no wheezes.  Abdominal: Soft.  Bowel sounds are normal. There is no tenderness.  Musculoskeletal: She exhibits no edema or tenderness.  Lymphadenopathy:    She has no cervical adenopathy.  Skin: No rash noted. No erythema.  Psychiatric: She has a normal mood and affect. Her behavior is normal.    BP 130/78 (BP Location: Left Arm, Patient Position: Sitting, Cuff Size: Normal)   Pulse 79   Temp 98.4 F (36.9 C) (Oral)   Resp 18   Wt 158 lb 3.2 oz (71.8 kg)   SpO2 98%   BMI 28.02 kg/m  Wt Readings from Last 3  Encounters:  11/17/17 158 lb 3.2 oz (71.8 kg)  09/17/17 153 lb 12.8 oz (69.8 kg)  08/19/17 157 lb (71.2 kg)     Lab Results  Component Value Date   WBC 4.7 06/09/2017   HGB 12.9 06/09/2017   HCT 38.5 06/09/2017   PLT 199.0 06/09/2017   GLUCOSE 113 (H) 11/16/2017   CHOL 164 11/16/2017   TRIG 197.0 (H) 11/16/2017   HDL 40.40 11/16/2017   LDLDIRECT 88.0 02/02/2017   LDLCALC 84 11/16/2017   ALT 17 11/16/2017   AST 22 11/16/2017   NA 141 11/16/2017   K 4.6 11/16/2017   CL 108 11/16/2017   CREATININE 1.74 (H) 11/16/2017   BUN 34 (H) 11/16/2017   CO2 23 11/16/2017   TSH 1.61 06/01/2017   HGBA1C 5.8 11/16/2017   MICROALBUR 2.1 (H) 03/27/2015    US Renal  Result Date: 11/03/2017 CLINICAL DATA:  Chronic kidney disease stage 3 EXAM: RENAL / URINARY TRACT ULTRASOUND COMPLETE COMPARISON:  CT 06/15/2017, ultrasound 05/22/2016 FINDINGS: Right Kidney: Length: 9.1 cm. Cortical echogenicity within normal limits. Mild cortical thinning. Prominent extrarenal pelvis. Left Kidney: Length: 9.3 cm. Cortical echogenicity within normal limits. No hydronephrosis. Small cyst in the upper pole measuring 9 x by 9 x 7 mm. Bladder: Appears normal for degree of bladder distention. IMPRESSION: Negative for hydronephrosis. Mild cortical thinning of the right kidney. Small cyst left kidney Electronically Signed   By: Donavan Foil M.D.   On: 11/03/2017 03:51       Assessment & Plan:   Problem List Items Addressed This Visit     Abdominal pain    Extensive w/up by GI.  Has had CT scan as outlined.  Normal ultrasound gallbladder.  Normal HIDA.  Doing well.  Eating.  Continue f/u with GI.        Anemia, iron deficiency    Follow cbc.        Aortic atherosclerosis (HCC)    Not on cholesterol medication.        CKD (chronic kidney disease) stage 3, GFR 30-59 ml/min (HCC)    Followed by nephrology.  Last evaluated 10/2017.  Recommended f/u in 3 months.        Fatty liver    Has been evaluated by GI.  Recommended following liver panel in 6 months.        GERD (gastroesophageal reflux disease)    Controlled on current medication regimen.  Follow.        Hypercholesterolemia    Low cholesterol diet and exercise.  Follow lipid panel.        Relevant Orders   Hepatic function panel   Lipid panel   Hyperglycemia    Low carb diet and exercise.  Follow met b and a1c.        Relevant Orders   Hemoglobin A1c   Hypertension (Chronic)    Blood pressure under good control.  Continue same medication regimen.  Follow pressures.  Follow metabolic panel.        Relevant Orders   Basic metabolic panel   Hypothyroidism (Chronic)    On thyroid replacement.  Follow tsh.       Low back pain    Has been evaluated by Dr Sharlet Salina.  Taking tramadol and gabapentin.  Tapering cymbalta.  Follow.  Stable.  S/p ESI.        Other Visit Diagnoses    Need for influenza vaccination    -  Primary   Relevant Orders   Flu  vaccine HIGH DOSE PF (Fluzone High dose) (Completed)       Einar Pheasant, MD

## 2017-11-20 ENCOUNTER — Encounter: Payer: Self-pay | Admitting: Internal Medicine

## 2017-11-20 DIAGNOSIS — R739 Hyperglycemia, unspecified: Secondary | ICD-10-CM | POA: Insufficient documentation

## 2017-11-20 NOTE — Assessment & Plan Note (Signed)
Has been evaluated by GI.  Recommended following liver panel in 6 months.

## 2017-11-20 NOTE — Assessment & Plan Note (Signed)
Extensive w/up by GI.  Has had CT scan as outlined.  Normal ultrasound gallbladder.  Normal HIDA.  Doing well.  Eating.  Continue f/u with GI.

## 2017-11-20 NOTE — Assessment & Plan Note (Signed)
Blood pressure under good control.  Continue same medication regimen.  Follow pressures.  Follow metabolic panel.   

## 2017-11-20 NOTE — Assessment & Plan Note (Signed)
Has been evaluated by Dr Sharlet Salina.  Taking tramadol and gabapentin.  Tapering cymbalta.  Follow.  Stable.  S/p ESI.

## 2017-11-20 NOTE — Assessment & Plan Note (Signed)
Not on cholesterol medication.

## 2017-11-20 NOTE — Assessment & Plan Note (Signed)
On thyroid replacement.  Follow tsh.  

## 2017-11-20 NOTE — Assessment & Plan Note (Signed)
Controlled on current medication regimen.  Follow.

## 2017-11-20 NOTE — Assessment & Plan Note (Signed)
Followed by nephrology.  Last evaluated 10/2017.  Recommended f/u in 3 months.

## 2017-11-20 NOTE — Assessment & Plan Note (Signed)
Low cholesterol diet and exercise.  Follow lipid panel.   

## 2017-11-20 NOTE — Assessment & Plan Note (Signed)
Follow cbc.  

## 2017-11-20 NOTE — Assessment & Plan Note (Signed)
Low carb diet and exercise.  Follow met b and a1c.   

## 2017-11-22 DIAGNOSIS — M48062 Spinal stenosis, lumbar region with neurogenic claudication: Secondary | ICD-10-CM | POA: Diagnosis not present

## 2017-11-22 DIAGNOSIS — M5416 Radiculopathy, lumbar region: Secondary | ICD-10-CM | POA: Diagnosis not present

## 2017-11-22 DIAGNOSIS — M5136 Other intervertebral disc degeneration, lumbar region: Secondary | ICD-10-CM | POA: Diagnosis not present

## 2017-12-13 DIAGNOSIS — M5136 Other intervertebral disc degeneration, lumbar region: Secondary | ICD-10-CM | POA: Diagnosis not present

## 2017-12-13 DIAGNOSIS — M5416 Radiculopathy, lumbar region: Secondary | ICD-10-CM | POA: Diagnosis not present

## 2017-12-13 DIAGNOSIS — B9689 Other specified bacterial agents as the cause of diseases classified elsewhere: Secondary | ICD-10-CM | POA: Diagnosis not present

## 2017-12-13 DIAGNOSIS — M48062 Spinal stenosis, lumbar region with neurogenic claudication: Secondary | ICD-10-CM | POA: Diagnosis not present

## 2017-12-13 DIAGNOSIS — J019 Acute sinusitis, unspecified: Secondary | ICD-10-CM | POA: Diagnosis not present

## 2017-12-28 DIAGNOSIS — M7541 Impingement syndrome of right shoulder: Secondary | ICD-10-CM | POA: Diagnosis not present

## 2017-12-28 DIAGNOSIS — M25511 Pain in right shoulder: Secondary | ICD-10-CM | POA: Diagnosis not present

## 2018-01-31 DIAGNOSIS — K5909 Other constipation: Secondary | ICD-10-CM | POA: Diagnosis not present

## 2018-01-31 DIAGNOSIS — R932 Abnormal findings on diagnostic imaging of liver and biliary tract: Secondary | ICD-10-CM | POA: Diagnosis not present

## 2018-01-31 DIAGNOSIS — Z23 Encounter for immunization: Secondary | ICD-10-CM | POA: Diagnosis not present

## 2018-01-31 DIAGNOSIS — R1011 Right upper quadrant pain: Secondary | ICD-10-CM | POA: Diagnosis not present

## 2018-01-31 DIAGNOSIS — Z1211 Encounter for screening for malignant neoplasm of colon: Secondary | ICD-10-CM | POA: Diagnosis not present

## 2018-02-19 ENCOUNTER — Other Ambulatory Visit: Payer: Self-pay | Admitting: Internal Medicine

## 2018-02-22 ENCOUNTER — Other Ambulatory Visit: Payer: Self-pay | Admitting: Internal Medicine

## 2018-03-12 ENCOUNTER — Other Ambulatory Visit: Payer: Self-pay | Admitting: Internal Medicine

## 2018-03-14 ENCOUNTER — Other Ambulatory Visit: Payer: Self-pay | Admitting: Internal Medicine

## 2018-03-17 ENCOUNTER — Other Ambulatory Visit: Payer: Self-pay | Admitting: Internal Medicine

## 2018-03-23 ENCOUNTER — Other Ambulatory Visit (INDEPENDENT_AMBULATORY_CARE_PROVIDER_SITE_OTHER): Payer: PPO

## 2018-03-23 DIAGNOSIS — R739 Hyperglycemia, unspecified: Secondary | ICD-10-CM

## 2018-03-23 DIAGNOSIS — I10 Essential (primary) hypertension: Secondary | ICD-10-CM | POA: Diagnosis not present

## 2018-03-23 DIAGNOSIS — E7800 Pure hypercholesterolemia, unspecified: Secondary | ICD-10-CM | POA: Diagnosis not present

## 2018-03-23 LAB — BASIC METABOLIC PANEL
BUN: 35 mg/dL — AB (ref 6–23)
CHLORIDE: 105 meq/L (ref 96–112)
CO2: 26 meq/L (ref 19–32)
CREATININE: 1.81 mg/dL — AB (ref 0.40–1.20)
Calcium: 9.7 mg/dL (ref 8.4–10.5)
GFR: 28.82 mL/min — ABNORMAL LOW (ref >60.00)
GLUCOSE: 86 mg/dL (ref 70–99)
Potassium: 4.3 mEq/L (ref 3.5–5.1)
Sodium: 140 mEq/L (ref 135–145)

## 2018-03-23 LAB — LIPID PANEL
CHOL/HDL RATIO: 4
Cholesterol: 162 mg/dL (ref 0–200)
HDL: 42.4 mg/dL (ref >39.00)
LDL CALC: 90 mg/dL (ref 0–99)
NONHDL: 119.78
Triglycerides: 149 mg/dL (ref 0.0–149.0)
VLDL: 29.8 mg/dL (ref 0.0–40.0)

## 2018-03-23 LAB — HEPATIC FUNCTION PANEL
ALT: 16 U/L (ref 0–35)
AST: 23 U/L (ref 0–37)
Albumin: 3.8 g/dL (ref 3.5–5.2)
Alkaline Phosphatase: 75 U/L (ref 39–117)
BILIRUBIN TOTAL: 0.6 mg/dL (ref 0.2–1.2)
Bilirubin, Direct: 0.1 mg/dL (ref 0.0–0.3)
Total Protein: 6.7 g/dL (ref 6.0–8.3)

## 2018-03-23 LAB — HEMOGLOBIN A1C: HEMOGLOBIN A1C: 5.6 % (ref 4.6–6.5)

## 2018-03-25 ENCOUNTER — Ambulatory Visit (INDEPENDENT_AMBULATORY_CARE_PROVIDER_SITE_OTHER): Payer: PPO | Admitting: Internal Medicine

## 2018-03-25 ENCOUNTER — Encounter: Payer: Self-pay | Admitting: Internal Medicine

## 2018-03-25 VITALS — BP 140/84 | HR 68 | Temp 98.3°F | Resp 16 | Wt 162.2 lb

## 2018-03-25 DIAGNOSIS — K219 Gastro-esophageal reflux disease without esophagitis: Secondary | ICD-10-CM

## 2018-03-25 DIAGNOSIS — E7800 Pure hypercholesterolemia, unspecified: Secondary | ICD-10-CM

## 2018-03-25 DIAGNOSIS — R739 Hyperglycemia, unspecified: Secondary | ICD-10-CM

## 2018-03-25 DIAGNOSIS — E039 Hypothyroidism, unspecified: Secondary | ICD-10-CM | POA: Diagnosis not present

## 2018-03-25 DIAGNOSIS — I700 Atherosclerosis of aorta: Secondary | ICD-10-CM | POA: Diagnosis not present

## 2018-03-25 DIAGNOSIS — I10 Essential (primary) hypertension: Secondary | ICD-10-CM

## 2018-03-25 DIAGNOSIS — N184 Chronic kidney disease, stage 4 (severe): Secondary | ICD-10-CM | POA: Diagnosis not present

## 2018-03-25 DIAGNOSIS — K760 Fatty (change of) liver, not elsewhere classified: Secondary | ICD-10-CM

## 2018-03-25 DIAGNOSIS — D509 Iron deficiency anemia, unspecified: Secondary | ICD-10-CM

## 2018-03-25 DIAGNOSIS — I351 Nonrheumatic aortic (valve) insufficiency: Secondary | ICD-10-CM

## 2018-03-25 NOTE — Progress Notes (Signed)
Patient ID: Caitlin Lin, female   DOB: Apr 12, 1940, 78 y.o.   MRN: [id number]   Subjective:    Patient ID: Caitlin Lin, female    DOB: Oct 26, 1940, 78 y.o.   MRN: [id number]  HPI  Patient here for a scheduled follow up.  She reports she is doing relatively well.  Tries to stay active.  No chest pain.  No sob with increased activity or exertion.  Does report occasionally noticing (while sitting) she will need to take a deep breath.  No cough or congestion.  Discussed further w/up and evaluation.  Discussed checking xray and ekg. She declines.  Wants to monitor.  No acid reflux.  No abdominal pain.  Bowels moving.  GI issues better.  No issues now.  Saw GI.  Recommended continuing fiber and is taking protonix.  Recommended f/u in one year.     Past Medical History:  Diagnosis Date  . Chronic kidney disease    Followed by Dr. Holley Raring  . Constipation due to slow transit   . Diarrhea   . Hyperlipidemia   . Hypertension   . Hypothyroidism   . Melanoma of lower leg ([place]) 2013  . Shingles 2013  . Shingles   . Spinal stenosis in cervical region    Dr. Sharlet Salina  . Subdural hematoma [facility])    Past Surgical History:  Procedure Laterality Date  . ABDOMINAL HYSTERECTOMY  1974  . ANTERIOR AND POSTERIOR VAGINAL REPAIR    . ANTERIOR AND POSTERIOR VAGINAL REPAIR W/ SACROSPINOUS LIGAMENT SUSPENSION Right   . BREAST BIOPSY  1963   Benign per pt's report  . COLONOSCOPY    . ESOPHAGOGASTRODUODENOSCOPY    . ESOPHAGOGASTRODUODENOSCOPY (EGD) WITH PROPOFOL N/A 11/10/2016   Procedure: ESOPHAGOGASTRODUODENOSCOPY (EGD) WITH PROPOFOL;  Surgeon: Lollie Sails, MD;  Location: [facility] ENDOSCOPY;  Service: Endoscopy;  Laterality: N/A;  . LUNG BIOPSY  2007   Benign per pt's report  . LUNG BIOPSY Right   . OOPHORECTOMY  1970  . PERINEOPLASTY  12/17/2013  . sling for stress incontinence  12/18/2011   Family History  Problem Relation Age of Onset  . Arthritis Mother   . Liver disease Mother   . Cirrhosis  Mother   . Heart disease Father   . Heart attack Father   . Depression Brother   . Breast cancer Other   . Breast cancer Cousin        maternal cousins   Social History   Socioeconomic History  . Marital status: Married    Spouse name: Not on file  . Number of children: 2  . Years of education: Not on file  . Highest education level: Not on file  Occupational History  . Occupation: Retired  Scientific laboratory technician  . Financial resource strain: Not hard at all  . Food insecurity:    Worry: Never true    Inability: Never true  . Transportation needs:    Medical: No    Non-medical: No  Tobacco Use  . Smoking status: Never Smoker  . Smokeless tobacco: Never Used  Substance and Sexual Activity  . Alcohol use: No  . Drug use: No  . Sexual activity: Yes  Lifestyle  . Physical activity:    Days per week: 0 days    Minutes per session: 0 min  . Stress: Not at all  Relationships  . Social connections:    Talks on phone: Not on file    Gets together: Not on file    Attends  religious service: Not on file    Active member of club or organization: Not on file    Attends meetings of clubs or organizations: Not on file    Relationship status: Not on file  Other Topics Concern  . Not on file  Social History Narrative   Lives in [place] with husband. Has 2 children boy and girl, 4 grandchildren. Retired Presenter, broadcasting.       Daily Caffeine Use:  NO   Regular Exercise -  Not at this time due to back pain, would like to resume soon    Outpatient Encounter Medications as of 03/25/2018  Medication Sig  . amLODipine (NORVASC) 2.5 MG tablet TAKE 1 TABLET BY MOUTH ONCE DAILY  . aspirin 81 MG tablet Take 1 tablet (81 mg total) by mouth daily.  Marland Kitchen atorvastatin (LIPITOR) 20 MG tablet Take 20 mg by mouth daily.  Marland Kitchen atorvastatin (LIPITOR) 20 MG tablet TAKE 1 TABLET BY MOUTH ONCE DAILY  . CRANBERRY-CALCIUM PO Take 1 tablet by mouth.  Mariane Baumgarten Sodium (COLACE PO) Take 2 capsules by mouth daily.  .  DULoxetine (CYMBALTA) 60 MG capsule TAKE 1 CAPSULE BY MOUTH ONCE DAILY  . fexofenadine-pseudoephedrine (ALLEGRA-D 24) 180-240 MG 24 hr tablet Take 1 tablet by mouth daily as needed.  Marland Kitchen glucosamine-chondroitin 500-400 MG tablet Take 1 tablet by mouth 3 (three) times daily.  Marland Kitchen levothyroxine (SYNTHROID, LEVOTHROID) 75 MCG tablet TAKE 1 TABLET BY MOUTH ONCE DAILY WITH BREAKFAST  . losartan (COZAAR) 50 MG tablet TAKE 1 TABLET BY MOUTH ONCE DAILY  . Multiple Vitamin (MULTIVITAMIN) tablet Take 1 tablet by mouth daily.  . Omega-3 Fatty Acids (FISH OIL) 1000 MG CAPS Take 1,000 mg by mouth daily.  . pantoprazole (PROTONIX) 40 MG tablet Take 40 mg by mouth daily.   No facility-administered encounter medications on file as of 03/25/2018.     Review of Systems  Constitutional: Negative for appetite change and unexpected weight change.  HENT: Negative for congestion and sinus pressure.   Respiratory: Negative for cough, chest tightness and shortness of breath.   Cardiovascular: Negative for chest pain, palpitations and leg swelling.  Gastrointestinal: Negative for abdominal pain, diarrhea, nausea and vomiting.  Genitourinary: Negative for difficulty urinating and dysuria.  Musculoskeletal: Negative for joint swelling and myalgias.  Skin: Negative for color change and rash.  Neurological: Negative for dizziness, light-headedness and headaches.  Psychiatric/Behavioral: Negative for agitation and dysphoric mood.       Objective:     Blood pressure rechecked by me:  140/84  Physical Exam Constitutional:      General: She is not in acute distress.    Appearance: Normal appearance.  HENT:     Nose: Nose normal. No congestion.     Mouth/Throat:     Pharynx: No oropharyngeal exudate or posterior oropharyngeal erythema.  Neck:     Musculoskeletal: Neck supple. No muscular tenderness.     Thyroid: No thyromegaly.  Cardiovascular:     Rate and Rhythm: Normal rate and regular rhythm.  Pulmonary:      Effort: No respiratory distress.     Breath sounds: Normal breath sounds. No wheezing.  Abdominal:     General: Bowel sounds are normal.     Palpations: Abdomen is soft.     Tenderness: There is no abdominal tenderness.  Musculoskeletal:        General: No swelling or tenderness.  Lymphadenopathy:     Cervical: No cervical adenopathy.  Skin:    Findings: No erythema  or rash.  Neurological:     Mental Status: She is alert.  Psychiatric:        Mood and Affect: Mood normal.        Behavior: Behavior normal.     BP 140/84   Pulse 68   Temp 98.3 F (36.8 C) (Oral)   Resp 16   Wt 162 lb 3.2 oz (73.6 kg)   SpO2 97%   BMI 28.73 kg/m  Wt Readings from Last 3 Encounters:  03/25/18 162 lb 3.2 oz (73.6 kg)  11/17/17 158 lb 3.2 oz (71.8 kg)  09/17/17 153 lb 12.8 oz (69.8 kg)     Lab Results  Component Value Date   WBC 4.7 06/09/2017   HGB 12.9 06/09/2017   HCT 38.5 06/09/2017   PLT 199.0 06/09/2017   GLUCOSE 86 03/23/2018   CHOL 162 03/23/2018   TRIG 149.0 03/23/2018   HDL 42.40 03/23/2018   LDLDIRECT 88.0 02/02/2017   LDLCALC 90 03/23/2018   ALT 16 03/23/2018   AST 23 03/23/2018   NA 140 03/23/2018   K 4.3 03/23/2018   CL 105 03/23/2018   CREATININE 1.81 (H) 03/23/2018   BUN 35 (H) 03/23/2018   CO2 26 03/23/2018   TSH 1.61 06/01/2017   HGBA1C 5.6 03/23/2018   MICROALBUR 2.1 (H) 03/27/2015    US Renal  Result Date: 11/03/2017 CLINICAL DATA:  Chronic kidney disease stage 3 EXAM: RENAL / URINARY TRACT ULTRASOUND COMPLETE COMPARISON:  CT 06/15/2017, ultrasound 05/22/2016 FINDINGS: Right Kidney: Length: 9.1 cm. Cortical echogenicity within normal limits. Mild cortical thinning. Prominent extrarenal pelvis. Left Kidney: Length: 9.3 cm. Cortical echogenicity within normal limits. No hydronephrosis. Small cyst in the upper pole measuring 9 x by 9 x 7 mm. Bladder: Appears normal for degree of bladder distention. IMPRESSION: Negative for hydronephrosis. Mild cortical  thinning of the right kidney. Small cyst left kidney Electronically Signed   By: Donavan Foil M.D.   On: 11/03/2017 03:51       Assessment & Plan:   Problem List Items Addressed This Visit    Anemia, iron deficiency    Follow cbc.       Aortic atherosclerosis (HCC)    On lipitor.        Aortic regurgitation    ECHO 06/2016 - aortic regurgitation. Recommended f/u echo in 2 years.        CKD (chronic kidney disease) stage 4, GFR 15-29 ml/min (HCC) - Primary    Has previously been followed by nephrology.  Has requested we follow here.  Last check slightly decreased.  Recheck metabolic panel to confirm stable.  Continue to avoid antiinflammatories.        Relevant Orders   Basic metabolic panel   Fatty liver    Has been evaluated by GI.  Follow liver function tests.  Recent check wnl.       GERD (gastroesophageal reflux disease)    Controlled on current regimen.        Hypercholesterolemia    Low cholesterol diet and exercise.  On lipitor.  Follow lipid panel and liver function tests.        Hyperglycemia    Low carb diet and exercise.  Follow met b and a1c.        Hypertension (Chronic)    Blood pressure as outlined.  Have her spot check her pressure.  Follow. Hold on making changes in her medication.  Follow metabolic panel.        Hypothyroidism (Chronic)  On thyroid replacement.  Follow tsh.           Einar Pheasant, MD

## 2018-03-27 ENCOUNTER — Encounter: Payer: Self-pay | Admitting: Internal Medicine

## 2018-03-27 NOTE — Assessment & Plan Note (Signed)
Follow cbc.  

## 2018-03-27 NOTE — Assessment & Plan Note (Signed)
Low cholesterol diet and exercise.  On lipitor.  Follow lipid panel and liver function tests.   

## 2018-03-27 NOTE — Assessment & Plan Note (Signed)
On thyroid replacement.  Follow tsh.  

## 2018-03-27 NOTE — Assessment & Plan Note (Signed)
Blood pressure as outlined.  Have her spot check her pressure.  Follow. Hold on making changes in her medication.  Follow metabolic panel.

## 2018-03-27 NOTE — Assessment & Plan Note (Signed)
Has previously been followed by nephrology.  Has requested we follow here.  Last check slightly decreased.  Recheck metabolic panel to confirm stable.  Continue to avoid antiinflammatories.

## 2018-03-27 NOTE — Assessment & Plan Note (Signed)
Controlled on current regimen.   

## 2018-03-27 NOTE — Assessment & Plan Note (Signed)
Low carb diet and exercise.  Follow met b and a1c.   

## 2018-03-27 NOTE — Assessment & Plan Note (Signed)
On lipitor

## 2018-03-27 NOTE — Assessment & Plan Note (Signed)
ECHO 06/2016 - aortic regurgitation. Recommended f/u echo in 2 years.

## 2018-03-27 NOTE — Assessment & Plan Note (Signed)
Has been evaluated by GI.  Follow liver function tests.  Recent check wnl.

## 2018-04-26 ENCOUNTER — Other Ambulatory Visit (INDEPENDENT_AMBULATORY_CARE_PROVIDER_SITE_OTHER): Payer: PPO

## 2018-04-26 DIAGNOSIS — N184 Chronic kidney disease, stage 4 (severe): Secondary | ICD-10-CM

## 2018-04-26 LAB — BASIC METABOLIC PANEL
BUN: 32 mg/dL — ABNORMAL HIGH (ref 6–23)
CO2: 26 meq/L (ref 19–32)
Calcium: 9.4 mg/dL (ref 8.4–10.5)
Chloride: 106 mEq/L (ref 96–112)
Creatinine, Ser: 1.73 mg/dL — ABNORMAL HIGH (ref 0.40–1.20)
GFR: 28.56 mL/min — ABNORMAL LOW (ref >60.00)
GLUCOSE: 96 mg/dL (ref 70–99)
POTASSIUM: 4.7 meq/L (ref 3.5–5.1)
SODIUM: 141 meq/L (ref 135–145)

## 2018-05-26 IMAGING — US US ABDOMEN COMPLETE W/ ELASTOGRAPHY
1 series · 12 of 12 positions shown · non-contrast
Comparison: 07/06/2016 and 05/22/2016

CLINICAL DATA: Hepatic fibrosis.  Followup prior examinations.



[Series 1: us abdomen complete w/ elastography · 0.19mm/px · 12 of 12 slices shown]
[im 1/12]
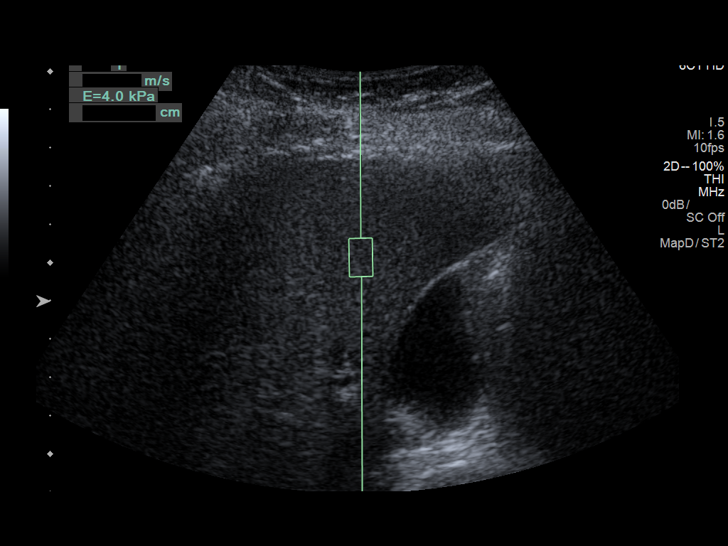
[im 2/12]
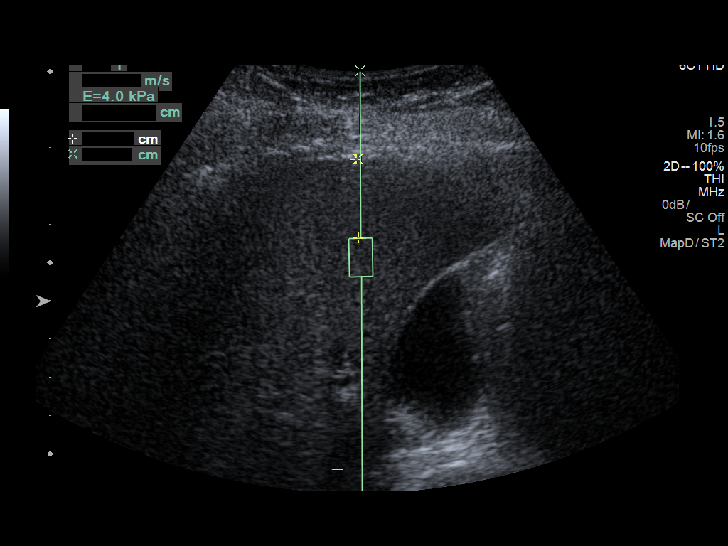
[im 3/12]
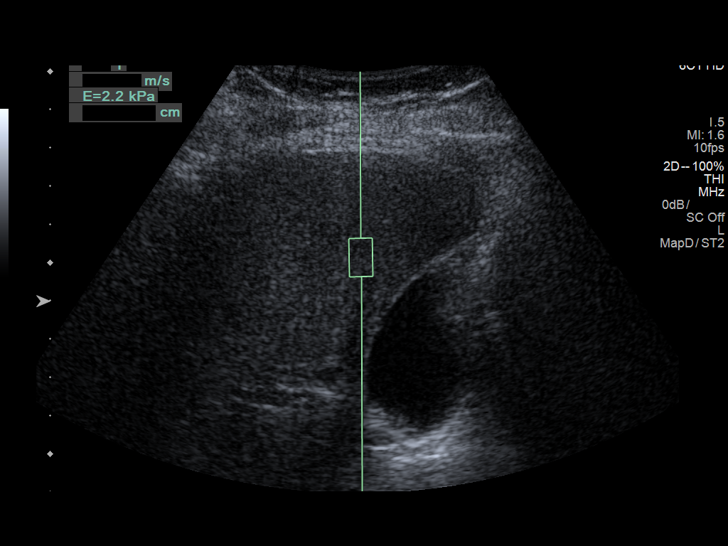
[im 4/12]
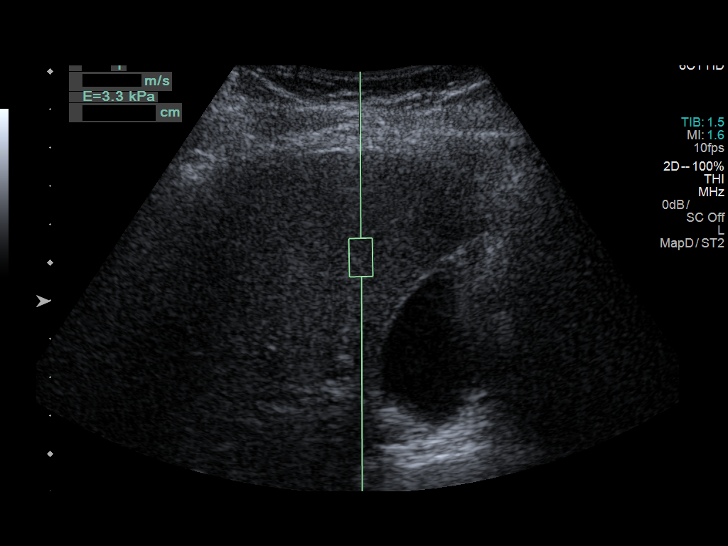
[im 5/12]
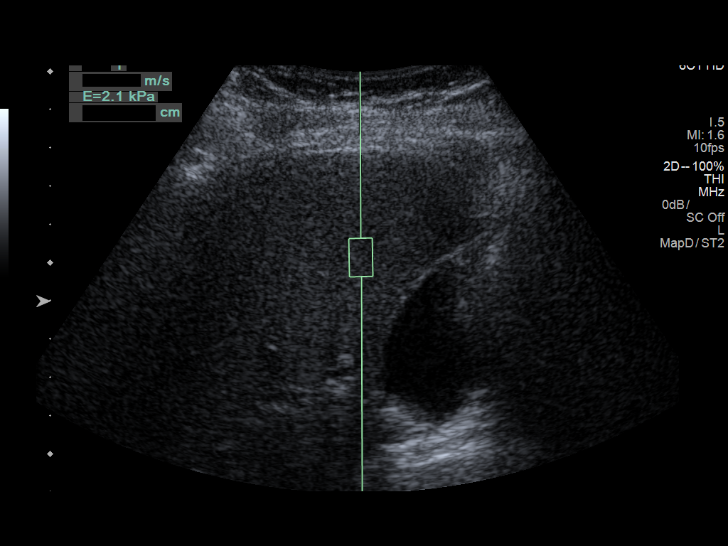
[im 6/12]
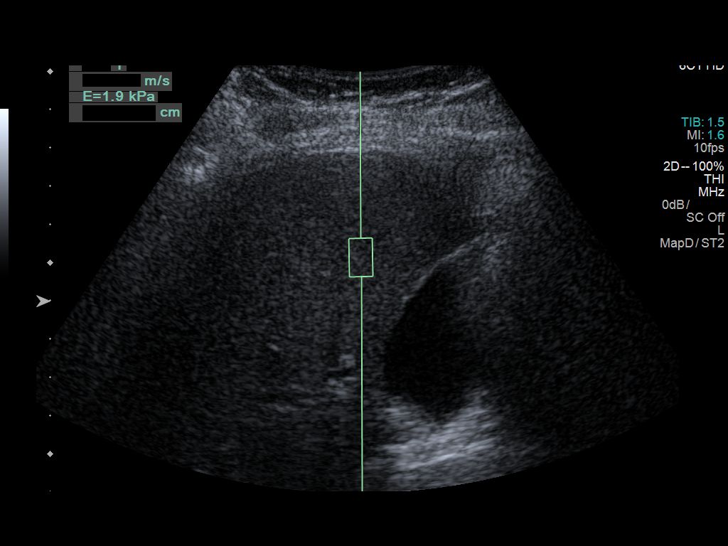
[im 7/12]
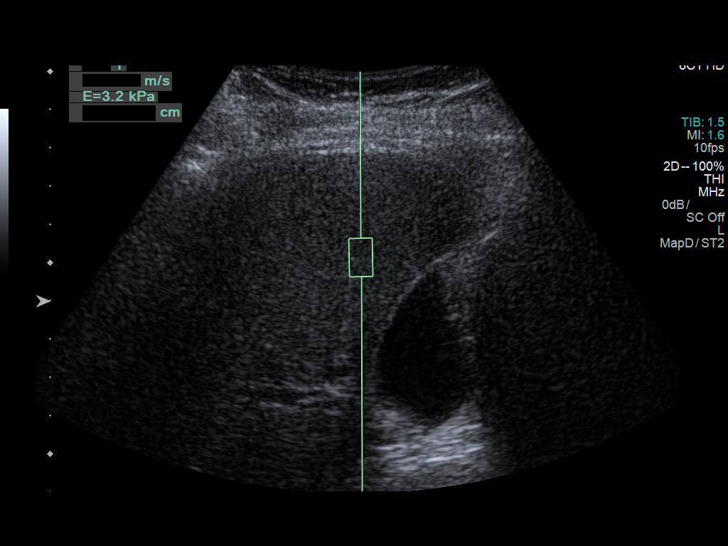
[im 8/12]
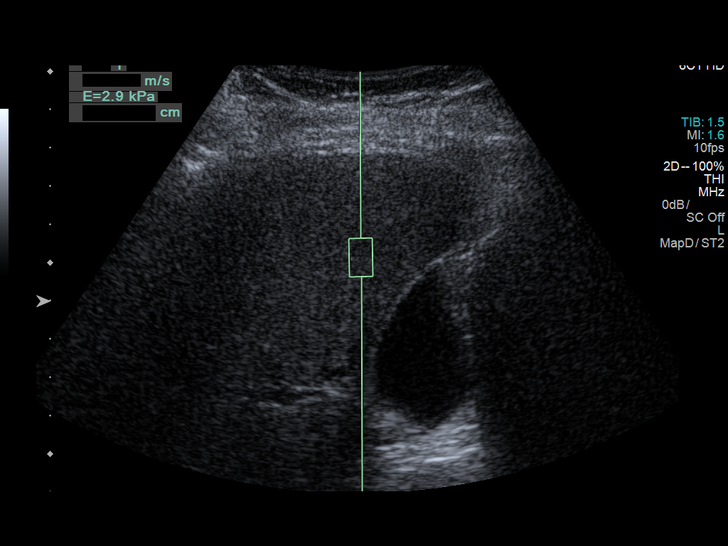
[im 9/12]
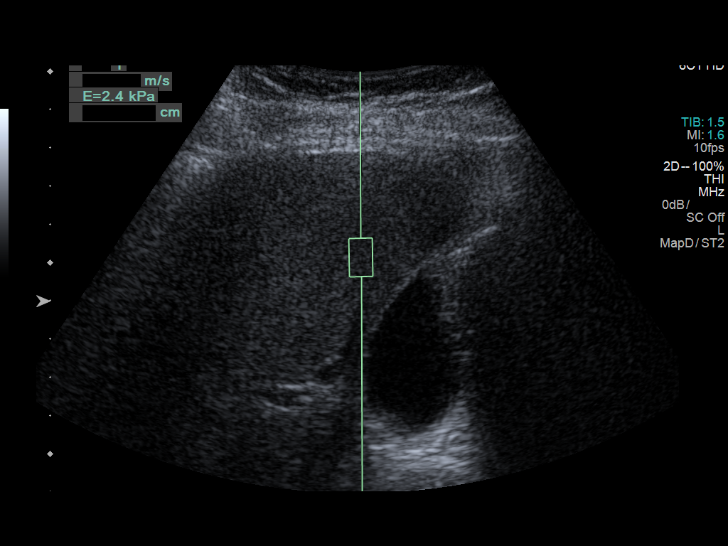
[im 10/12]
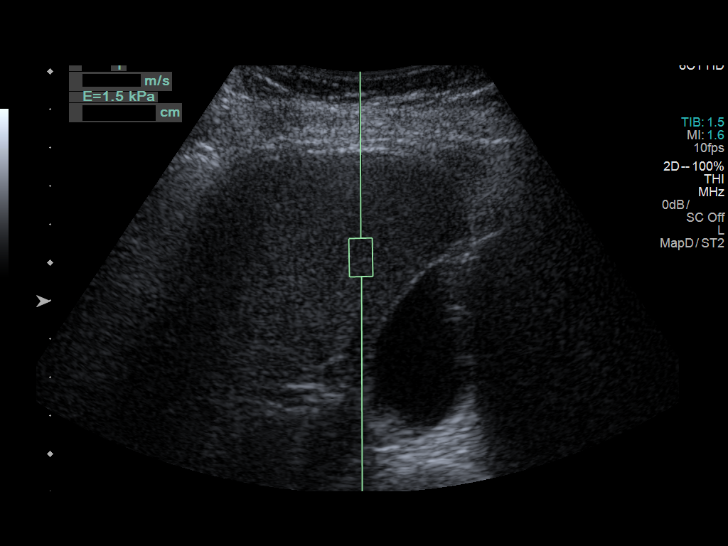
[im 11/12]
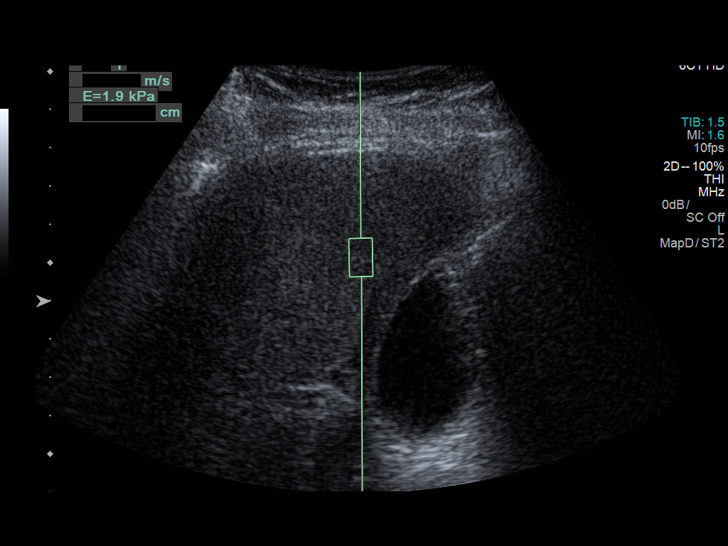
[im 12/12]
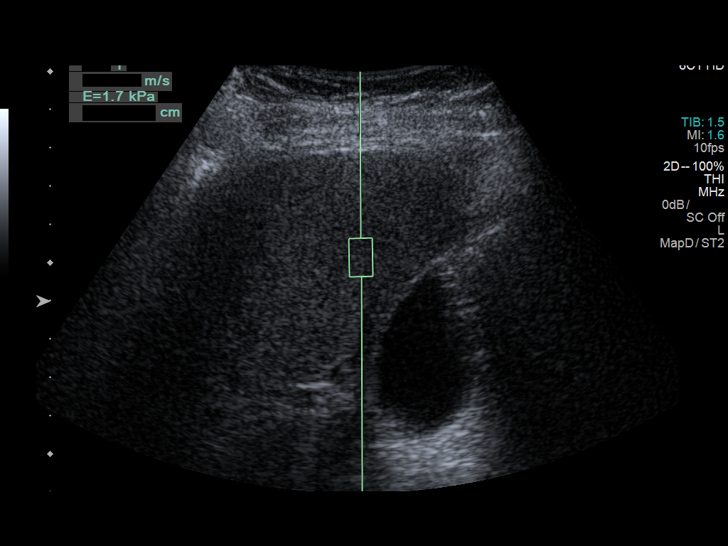

[12 of 12 positions shown; findings below may reference images not displayed]

FINDINGS: ULTRASOUND ABDOMEN

Gallbladder: No gallstones or wall thickening visualized. No
sonographic Murphy sign noted by sonographer.

Common bile duct: Diameter: 1.9 mm

Liver: Diffuse increased echogenicity and coarse echotexture but no
focal hepatic lesions or intrahepatic biliary dilatation. Portal
vein is patent on color Doppler imaging with normal direction of
blood flow towards the liver.

IVC: Normal caliber are

Pancreas: Visualized portion unremarkable.

Spleen: Size and appearance within normal limits.

Right Kidney: Length: 9.2 cm. Normal renal cortical thickness and
echogenicity without focal lesions or hydronephrosis.

Left Kidney: Length: 9.0 cm. Normal renal cortical thickness and
echogenicity without focal lesions or hydronephrosis.

Abdominal aorta: Normal caliber

Other findings: None.

ULTRASOUND HEPATIC ELASTOGRAPHY

Device: Siemens Helix VTQ

Patient position: Oblique

Transducer [id number]

Number of measurements: 10

Hepatic segment:  8

Median velocity:   0.84  m/sec

IQR:

IQR/Median velocity ratio:

Corresponding Metavir fibrosis score:  F0/F1

Risk of fibrosis: Minimal

Limitations of exam: None

Pertinent findings noted on other imaging exams:  None

Please note that abnormal shear wave velocities may also be
identified in clinical settings other than with hepatic fibrosis,
such as: acute hepatitis, elevated right heart and central venous
pressures including use of beta blockers, Baudy disease
([place]), infiltrative processes such as
mastocytosis/amyloidosis/infiltrative tumor, extrahepatic
cholestasis, in the post-prandial state, and liver transplantation.
Correlation with patient history, laboratory data, and clinical
condition recommended.
IMPRESSION: ULTRASOUND ABDOMEN:
Unremarkable abdominal ultrasound examination.

ULTRASOUND HEPATIC ELASTOGRAPHY:

Median hepatic shear wave velocity is calculated at 0.84 m/sec.

Corresponding Metavir fibrosis score is  F0/F1.

Risk of fibrosis is Minimal.

Follow-up: None required

Fairly remarkable decrease in median shear velocity when compared to
prior study. I assume this patient has been treated? Other
possibilities would include that the prior study which showed
fibrosis may have been due to another process such as acute
hepatitis which has resolved. Recommend clinical correlation. A
six-month follow-up ultrasound may be helpful to reassess.
Correlation with liver function studies is also suggested.

## 2018-06-12 ENCOUNTER — Other Ambulatory Visit: Payer: Self-pay | Admitting: Internal Medicine

## 2018-06-27 ENCOUNTER — Ambulatory Visit (INDEPENDENT_AMBULATORY_CARE_PROVIDER_SITE_OTHER): Payer: PPO | Admitting: Internal Medicine

## 2018-06-27 ENCOUNTER — Encounter: Payer: Self-pay | Admitting: Internal Medicine

## 2018-06-27 DIAGNOSIS — K623 Rectal prolapse: Secondary | ICD-10-CM | POA: Diagnosis not present

## 2018-06-27 DIAGNOSIS — K219 Gastro-esophageal reflux disease without esophagitis: Secondary | ICD-10-CM | POA: Diagnosis not present

## 2018-06-27 DIAGNOSIS — N184 Chronic kidney disease, stage 4 (severe): Secondary | ICD-10-CM | POA: Diagnosis not present

## 2018-06-27 DIAGNOSIS — R1030 Lower abdominal pain, unspecified: Secondary | ICD-10-CM | POA: Diagnosis not present

## 2018-06-27 DIAGNOSIS — K760 Fatty (change of) liver, not elsewhere classified: Secondary | ICD-10-CM

## 2018-06-27 NOTE — Progress Notes (Addendum)
Patient ID: Caitlin Lin, female   DOB: 1940-08-25, 78 y.o.   MRN: [id number] Virtual Visit via Video:  Note  This visit type was conducted due to national recommendations for restrictions regarding the COVID-19 pandemic (e.g. social distancing).  This format is felt to be most appropriate for this patient at this time.  All issues noted in this document were discussed and addressed.  No physical exam was performed (except for noted visual exam findings with Video Visits).   I connected with Caitlin Lin on 06/27/18 at 12:00 PM EDT by a video enabled telemedicine application and verified that I am speaking with the correct person using two identifiers. Location patient: home Location provider: work Persons participating in the virtual visit: patient, provider  I discussed the limitations, risks, security and privacy concerns of performing an evaluation and management service by telephone and the availability of in person appointments. The patient expressed understanding and agreed to proceed.   Reason for visit: acute visit for abdominal pain.    HPI: Reports last week she developed increased abdominal pain.  States she noticed increased gas and pressure in her lower abdomen/pelvis.  Some nausea during the day.  Described as a stomach ache - more from her pelvis down.  States is she lies on her right side, noticed more nausea.  Felt pressure in her rectum.  She used hemorrhoid medication (preparation H).  Helped.  Took phazyme.  Pain worse for 2-3 days last week.  No diarrhea. Pain with bowel movement.  (pain in the rectum).   Has noticed some increased acid - up into her throat.  No urinary symptoms.  No blood in her stool.  No fever or body aches.  No vomiting.  She adjusted her diet.  Ate less and more bland foods.  Used the hemorrhoid medication and took phazyme.  Pain improved.  States she took miralax a few days ago and had a small bowel movement - two days ago.  She is staying hydrated.  Has  seen GI previously.  Last colonoscopy 2013.  Last EGD - 2018.  Had w/up last year for abdominal pain (more RUQ pain).  CT revealed multiple diverticula, moderate amount of feces and small hiatal hernia.  Also had ultrasound and HIDA scan - 07/2017.  Does feel better today.  Describes still noticing some soreness, but much improved.     ROS: See pertinent positives and negatives per HPI.  Past Medical History:  Diagnosis Date  . Chronic kidney disease    Followed by Dr. Holley Raring  . Constipation due to slow transit   . Diarrhea   . Hyperlipidemia   . Hypertension   . Hypothyroidism   . Melanoma of lower leg ([place]) 2013  . Shingles 2013  . Shingles   . Spinal stenosis in cervical region    Dr. Sharlet Salina  . Subdural hematoma [facility])     Past Surgical History:  Procedure Laterality Date  . ABDOMINAL HYSTERECTOMY  1974  . ANTERIOR AND POSTERIOR VAGINAL REPAIR    . ANTERIOR AND POSTERIOR VAGINAL REPAIR W/ SACROSPINOUS LIGAMENT SUSPENSION Right   . BREAST BIOPSY  1963   Benign per pt's report  . COLONOSCOPY    . ESOPHAGOGASTRODUODENOSCOPY    . ESOPHAGOGASTRODUODENOSCOPY (EGD) WITH PROPOFOL N/A 11/10/2016   Procedure: ESOPHAGOGASTRODUODENOSCOPY (EGD) WITH PROPOFOL;  Surgeon: Lollie Sails, MD;  Location: [facility] ENDOSCOPY;  Service: Endoscopy;  Laterality: N/A;  . LUNG BIOPSY  2007   Benign per pt's report  . LUNG BIOPSY  Right   . OOPHORECTOMY  1970  . PERINEOPLASTY  12/17/2013  . sling for stress incontinence  12/18/2011    Family History  Problem Relation Age of Onset  . Arthritis Mother   . Liver disease Mother   . Cirrhosis Mother   . Heart disease Father   . Heart attack Father   . Depression Brother   . Breast cancer Other   . Breast cancer Cousin        maternal cousins    SOCIAL HX: reviewed.     Current Outpatient Medications:  .  amLODipine (NORVASC) 2.5 MG tablet, TAKE 1 TABLET BY MOUTH ONCE DAILY, Disp: 90 tablet, Rfl: 1 .  aspirin 81 MG tablet, Take 1 tablet  (81 mg total) by mouth daily., Disp: 30 tablet, Rfl: 3 .  atorvastatin (LIPITOR) 20 MG tablet, TAKE 1 TABLET BY MOUTH ONCE DAILY, Disp: 90 tablet, Rfl: 3 .  CRANBERRY-CALCIUM PO, Take 1 tablet by mouth., Disp: , Rfl:  .  Docusate Sodium (COLACE PO), Take 2 capsules by mouth daily., Disp: , Rfl:  .  fexofenadine-pseudoephedrine (ALLEGRA-D 24) 180-240 MG 24 hr tablet, Take 1 tablet by mouth daily as needed., Disp: 90 tablet, Rfl: 3 .  glucosamine-chondroitin 500-400 MG tablet, Take 1 tablet by mouth 3 (three) times daily., Disp: 90 tablet, Rfl: 3 .  levothyroxine (SYNTHROID, LEVOTHROID) 75 MCG tablet, Take 1 tablet by mouth once daily with breakfast, Disp: 90 tablet, Rfl: 0 .  losartan (COZAAR) 50 MG tablet, Take 1 tablet by mouth once daily, Disp: 90 tablet, Rfl: 0 .  Multiple Vitamin (MULTIVITAMIN) tablet, Take 1 tablet by mouth daily., Disp: , Rfl:  .  Omega-3 Fatty Acids (FISH OIL) 1000 MG CAPS, Take 1,000 mg by mouth daily., Disp: , Rfl:  .  pantoprazole (PROTONIX) 40 MG tablet, Take 40 mg by mouth daily., Disp: , Rfl:   EXAM:  GENERAL: alert, oriented, appears well and in no acute distress  HEENT: atraumatic, conjunttiva clear, no obvious abnormalities on inspection of external nose and ears  NECK: normal movements of the head and neck  LUNGS: on inspection no signs of respiratory distress, breathing rate appears normal, no obvious gross SOB, gasping or wheezing  CV: no obvious cyanosis  PSYCH/NEURO: pleasant and cooperative, no obvious depression or anxiety, speech and thought processing grossly intact  ASSESSMENT AND PLAN:  Discussed the following assessment and plan:  Lower abdominal pain  CKD (chronic kidney disease) stage 4, GFR 15-29 ml/min (HCC)  Fatty liver  Gastroesophageal reflux disease, esophagitis presence not specified  Rectal prolapse  Abdominal pain Describes the lower abdominal pain/pelvic pain as outlined.  Increased gas and some acid reflux.  Has taken  phazyme and used preparation H.  Discussed possible etiologies.  Discussed diverticulitis, constipation, colitis, etc.  Pain is better now.  Discussed possible need for labs and scanning.  She prefers to hold for now, since symptoms have improved.  Discussed using a suppository or enema, to help get bowels moving.  Stay hydrated.  Continue miralax.  Discussed taking daily.  No urinary symptoms.  Treat acid reflux - increase protonix to bid for now.  Call with update over the next few days.  Any worsening symptoms or change in symptoms, she will need to be evaluated.    CKD (chronic kidney disease) stage 4, GFR 15-29 ml/min (HCC) Has previously been followed by nephrology.  Has been relatively stable.  Follow metabolic panel.    Fatty liver Worked up by GI.  Follow liver function tests.    GERD (gastroesophageal reflux disease) Increase protonix to bid - short term.  Call with update.   Rectal prolapse History of rectal prolapse.  S/p urogynecology evaluation and surgery.  Discussed with her today.  Denies any known prolapse now, etc.  See if we can get bowels moving regularly as outlined.  Follow.      I discussed the assessment and treatment plan with the patient. The patient was provided an opportunity to ask questions and all were answered. The patient agreed with the plan and demonstrated an understanding of the instructions.   The patient was advised to call back or seek an in-person evaluation if the symptoms worsen or if the condition fails to improve as anticipated.    Einar Pheasant, MD

## 2018-06-30 ENCOUNTER — Encounter: Payer: Self-pay | Admitting: Internal Medicine

## 2018-06-30 ENCOUNTER — Encounter: Payer: PPO | Admitting: Internal Medicine

## 2018-06-30 NOTE — Assessment & Plan Note (Signed)
Worked up by GI.  Follow liver function tests.

## 2018-06-30 NOTE — Assessment & Plan Note (Signed)
Has previously been followed by nephrology.  Has been relatively stable.  Follow metabolic panel.

## 2018-06-30 NOTE — Assessment & Plan Note (Signed)
Increase protonix to bid - short term.  Call with update.

## 2018-06-30 NOTE — Assessment & Plan Note (Signed)
Describes the lower abdominal pain/pelvic pain as outlined.  Increased gas and some acid reflux.  Has taken phazyme and used preparation H.  Discussed possible etiologies.  Discussed diverticulitis, constipation, colitis, etc.  Pain is better now.  Discussed possible need for labs and scanning.  She prefers to hold for now, since symptoms have improved.  Discussed using a suppository or enema, to help get bowels moving.  Stay hydrated.  Continue miralax.  Discussed taking daily.  No urinary symptoms.  Treat acid reflux - increase protonix to bid for now.  Call with update over the next few days.  Any worsening symptoms or change in symptoms, she will need to be evaluated.

## 2018-06-30 NOTE — Assessment & Plan Note (Signed)
History of rectal prolapse.  S/p urogynecology evaluation and surgery.  Discussed with her today.  Denies any known prolapse now, etc.  See if we can get bowels moving regularly as outlined.  Follow.

## 2018-07-21 ENCOUNTER — Telehealth: Payer: Self-pay

## 2018-07-21 DIAGNOSIS — E039 Hypothyroidism, unspecified: Secondary | ICD-10-CM

## 2018-07-21 DIAGNOSIS — R110 Nausea: Secondary | ICD-10-CM

## 2018-07-21 DIAGNOSIS — R1084 Generalized abdominal pain: Secondary | ICD-10-CM

## 2018-07-21 DIAGNOSIS — R739 Hyperglycemia, unspecified: Secondary | ICD-10-CM

## 2018-07-21 DIAGNOSIS — K760 Fatty (change of) liver, not elsewhere classified: Secondary | ICD-10-CM

## 2018-07-21 DIAGNOSIS — D509 Iron deficiency anemia, unspecified: Secondary | ICD-10-CM

## 2018-07-21 DIAGNOSIS — E7800 Pure hypercholesterolemia, unspecified: Secondary | ICD-10-CM

## 2018-07-21 DIAGNOSIS — R1030 Lower abdominal pain, unspecified: Secondary | ICD-10-CM

## 2018-07-21 DIAGNOSIS — I10 Essential (primary) hypertension: Secondary | ICD-10-CM

## 2018-07-21 NOTE — Telephone Encounter (Signed)
Pt confirmed no fever and bowels are moving regular for her (she does not go everyday.) She has been scheduled for labs and is agreeable to CT scan and f/u with GI

## 2018-07-21 NOTE — Telephone Encounter (Signed)
If persistent pain, I agree with need for further w/up and evaluation.  I have placed order for labs.  She can come in for lab check.  Also, confirm no fever and are her bowels moving?  Given her persistent symptoms, I would recommend a f/u CT scan.  She had one last year, but symptoms have changed.  Also, would she be agreeable for f/u with GI?

## 2018-07-21 NOTE — Telephone Encounter (Signed)
For the last two weeks she wakes up about 6:00-6:30 in the morning with a weird feeling from underneath her breast all the way down to her pelvis. She feels nauseous/weak. Doesn't have much of an appetite. Still has a lot of gas. Increasing protonix has not helped. Denies fever, chills, body aches, vomiting, diarrhea. When she lays on her right side she gets a nauseous. When she is laying down she has a heaviness in her chest- better when she gets up. Also pt would like to have some labs done too because she has not had any in awhile. She has ate toast and crackers today which she seems to tolerate better than a whole meal.

## 2018-07-21 NOTE — Telephone Encounter (Signed)
Copied from [place] [phone]. Topic: General - Inquiry >> Jul 21, 2018  9:32 AM Scherrie Gerlach wrote: Reason for CRM: pt states Dr Nicki Reaper advised her if her symptoms did not get better., to call back and ask or trish. Because she may have to have additional testing. Pt declined to give any other info, states she is to discuss with Trish, per Dr Nicki Reaper Pt would like a call back, thanks

## 2018-07-22 ENCOUNTER — Other Ambulatory Visit: Payer: PPO

## 2018-07-22 ENCOUNTER — Telehealth: Payer: Self-pay

## 2018-07-22 ENCOUNTER — Other Ambulatory Visit (INDEPENDENT_AMBULATORY_CARE_PROVIDER_SITE_OTHER): Payer: PPO

## 2018-07-22 ENCOUNTER — Other Ambulatory Visit: Payer: Self-pay | Admitting: Internal Medicine

## 2018-07-22 ENCOUNTER — Other Ambulatory Visit: Payer: Self-pay

## 2018-07-22 DIAGNOSIS — R1030 Lower abdominal pain, unspecified: Secondary | ICD-10-CM

## 2018-07-22 DIAGNOSIS — R319 Hematuria, unspecified: Secondary | ICD-10-CM | POA: Diagnosis not present

## 2018-07-22 DIAGNOSIS — E7800 Pure hypercholesterolemia, unspecified: Secondary | ICD-10-CM

## 2018-07-22 DIAGNOSIS — I10 Essential (primary) hypertension: Secondary | ICD-10-CM | POA: Diagnosis not present

## 2018-07-22 DIAGNOSIS — E039 Hypothyroidism, unspecified: Secondary | ICD-10-CM

## 2018-07-22 DIAGNOSIS — R739 Hyperglycemia, unspecified: Secondary | ICD-10-CM | POA: Diagnosis not present

## 2018-07-22 LAB — CBC WITH DIFFERENTIAL/PLATELET
Basophils Absolute: 0 10*3/uL (ref 0.0–0.1)
Basophils Relative: 0.8 % (ref 0.0–3.0)
Eosinophils Absolute: 0.2 10*3/uL (ref 0.0–0.7)
Eosinophils Relative: 3.4 % (ref 0.0–5.0)
HCT: 38.2 % (ref 36.0–46.0)
Hemoglobin: 12.8 g/dL (ref 12.0–15.0)
Lymphocytes Relative: 54.5 % — ABNORMAL HIGH (ref 12.0–46.0)
Lymphs Abs: 2.6 10*3/uL (ref 0.7–4.0)
MCHC: 33.6 g/dL (ref 30.0–36.0)
MCV: 87.9 fl (ref 78.0–100.0)
Monocytes Absolute: 0.7 10*3/uL (ref 0.1–1.0)
Monocytes Relative: 15.5 % — ABNORMAL HIGH (ref 3.0–12.0)
Neutro Abs: 1.2 10*3/uL — ABNORMAL LOW (ref 1.4–7.7)
Neutrophils Relative %: 25.8 % — ABNORMAL LOW (ref 43.0–77.0)
Platelets: 200 10*3/uL (ref 150.0–400.0)
RBC: 4.35 Mil/uL (ref 3.87–5.11)
RDW: 13.8 % (ref 11.5–15.5)
WBC: 4.7 10*3/uL (ref 4.0–10.5)

## 2018-07-22 LAB — URINALYSIS, ROUTINE W REFLEX MICROSCOPIC
Bilirubin Urine: NEGATIVE
Ketones, ur: NEGATIVE
Nitrite: NEGATIVE
Specific Gravity, Urine: <=1.005 — AB (ref 1.000–1.030)
Total Protein, Urine: NEGATIVE
Urine Glucose: NEGATIVE
Urobilinogen, UA: 0.2 (ref 0.0–1.0)
pH: 6 (ref 5.0–8.0)

## 2018-07-22 LAB — LIPID PANEL
Cholesterol: 174 mg/dL (ref 0–200)
HDL: 39.5 mg/dL (ref >39.00)
NonHDL: 134.19
Total CHOL/HDL Ratio: 4
Triglycerides: 225 mg/dL — ABNORMAL HIGH (ref 0.0–149.0)
VLDL: 45 mg/dL — ABNORMAL HIGH (ref 0.0–40.0)

## 2018-07-22 LAB — BASIC METABOLIC PANEL
BUN: 26 mg/dL — ABNORMAL HIGH (ref 6–23)
CO2: 27 mEq/L (ref 19–32)
Calcium: 9.7 mg/dL (ref 8.4–10.5)
Chloride: 104 mEq/L (ref 96–112)
Creatinine, Ser: 1.72 mg/dL — ABNORMAL HIGH (ref 0.40–1.20)
GFR: 28.73 mL/min — ABNORMAL LOW (ref >60.00)
Glucose, Bld: 99 mg/dL (ref 70–99)
Potassium: 5.1 mEq/L (ref 3.5–5.1)
Sodium: 139 mEq/L (ref 135–145)

## 2018-07-22 LAB — LDL CHOLESTEROL, DIRECT: Direct LDL: 84 mg/dL

## 2018-07-22 LAB — HEPATIC FUNCTION PANEL
ALT: 19 U/L (ref 0–35)
AST: 26 U/L (ref 0–37)
Albumin: 4 g/dL (ref 3.5–5.2)
Alkaline Phosphatase: 100 U/L (ref 39–117)
Bilirubin, Direct: 0.1 mg/dL (ref 0.0–0.3)
Total Bilirubin: 0.6 mg/dL (ref 0.2–1.2)
Total Protein: 7.2 g/dL (ref 6.0–8.3)

## 2018-07-22 LAB — LIPASE: Lipase: 34 U/L (ref 11.0–59.0)

## 2018-07-22 LAB — TSH: TSH: 3.55 u[IU]/mL (ref 0.35–4.50)

## 2018-07-22 LAB — HEMOGLOBIN A1C: Hgb A1c MFr Bld: 5.8 % (ref 4.6–6.5)

## 2018-07-22 LAB — AMYLASE: Amylase: 101 U/L (ref 27–131)

## 2018-07-22 NOTE — Telephone Encounter (Signed)
Pt aware.

## 2018-07-22 NOTE — Progress Notes (Signed)
Order placed for urine culture 

## 2018-07-22 NOTE — Telephone Encounter (Signed)
Called patient from recall list.  No answer. LMOV.  This is the second attempt per recall list.

## 2018-07-22 NOTE — Telephone Encounter (Signed)
Patient returning call.  Patient states she has been following up with her PCP and they have been doing some lab work and testing. She stated that she will wait and see what they say before making an appointment.  Patient states she will give [place] a call when she is ready to schedule.

## 2018-07-22 NOTE — Telephone Encounter (Signed)
Notify pt that I have placed order for CT and for GI referral.  Someone should be contacting her with appt date and time.  If any worsening symptoms, will need to be evaluated.

## 2018-07-25 ENCOUNTER — Other Ambulatory Visit: Payer: Self-pay

## 2018-07-25 LAB — URINE CULTURE
MICRO NUMBER:: 458157
SPECIMEN QUALITY:: ADEQUATE

## 2018-07-25 MED ORDER — CEFDINIR 300 MG PO CAPS: 300.0000 mg | ORAL_CAPSULE | Freq: Two times a day (BID) | ORAL | 0 refills | Status: DC

## 2018-07-28 DIAGNOSIS — R1084 Generalized abdominal pain: Secondary | ICD-10-CM | POA: Diagnosis not present

## 2018-07-28 DIAGNOSIS — K5909 Other constipation: Secondary | ICD-10-CM | POA: Diagnosis not present

## 2018-07-28 DIAGNOSIS — K219 Gastro-esophageal reflux disease without esophagitis: Secondary | ICD-10-CM | POA: Diagnosis not present

## 2018-07-29 ENCOUNTER — Telehealth: Payer: Self-pay | Admitting: *Deleted

## 2018-07-29 DIAGNOSIS — E875 Hyperkalemia: Secondary | ICD-10-CM

## 2018-07-29 NOTE — Telephone Encounter (Signed)
Please place future orders for lab appt on Monday (appt note states: Potassium)

## 2018-07-30 NOTE — Telephone Encounter (Signed)
Order placed for stat potassium.

## 2018-08-01 ENCOUNTER — Other Ambulatory Visit: Payer: Self-pay

## 2018-08-01 ENCOUNTER — Other Ambulatory Visit (INDEPENDENT_AMBULATORY_CARE_PROVIDER_SITE_OTHER): Payer: PPO

## 2018-08-01 DIAGNOSIS — E875 Hyperkalemia: Secondary | ICD-10-CM

## 2018-08-01 LAB — POTASSIUM: Potassium: 4.7 mEq/L (ref 3.5–5.1)

## 2018-08-03 ENCOUNTER — Ambulatory Visit
Admission: RE | Admit: 2018-08-03 | Discharge: 2018-08-03 | Disposition: A | Discharge status: Home / Self Care | Payer: PPO | Source: Ambulatory Visit | Attending: Internal Medicine | Admitting: Internal Medicine

## 2018-08-03 ENCOUNTER — Other Ambulatory Visit: Payer: Self-pay

## 2018-08-03 DIAGNOSIS — R110 Nausea: Secondary | ICD-10-CM | POA: Insufficient documentation

## 2018-08-03 DIAGNOSIS — R109 Unspecified abdominal pain: Secondary | ICD-10-CM | POA: Diagnosis not present

## 2018-08-03 DIAGNOSIS — R1084 Generalized abdominal pain: Secondary | ICD-10-CM

## 2018-08-17 ENCOUNTER — Telehealth: Payer: Self-pay | Admitting: *Deleted

## 2018-08-17 ENCOUNTER — Other Ambulatory Visit: Payer: Self-pay | Admitting: Internal Medicine

## 2018-08-17 ENCOUNTER — Telehealth: Payer: Self-pay | Admitting: Internal Medicine

## 2018-08-17 DIAGNOSIS — R911 Solitary pulmonary nodule: Secondary | ICD-10-CM

## 2018-08-17 DIAGNOSIS — R918 Other nonspecific abnormal finding of lung field: Secondary | ICD-10-CM

## 2018-08-17 NOTE — Telephone Encounter (Signed)
Caitlin Lin, Dr Genevive Bi reviewed this pts CT scan and recommended a f/u chest CT in 6 weeks and then f/u appt with him.  I have placed the order for the CT chest - without contrast and an order for a referral to Dr Genevive Bi.  She needs the CT in 6 weeks and then f/u with Dr Genevive Bi after.  See me if any questions.  Thanks.

## 2018-08-17 NOTE — Telephone Encounter (Signed)
-----   Message from Lars Masson, LPN sent at 0/05/2120 10:35 AM EDT ----- Regarding: RE: review Ct Pt is agreeable to f/u chest CT and f/u with Dr Genevive Bi ----- Message ----- From: Einar Pheasant, MD Sent: 08/15/2018   9:15 PM EDT To: Lars Masson, LPN Subject: FW: review Ct                                  Please call [name] and notify her that Dr Genevive Bi did review her scan.  (she knew that I was going to have him review).  He is recommending a follow up chest CT in 6 weeks and then a f/u appt with him.  If she is agreeable, let me know and I will place the order and arrange the appt.  Let me know either way.  (please send message back to me).  Thanks    Dr Nicki Reaper ----- Message ----- From: Lieutenant Diego, RN Sent: 08/15/2018   8:21 AM EDT To: Einar Pheasant, MD Subject: RE: review Ct                                  Dr. Nicki Reaper, here is what Dr. Genevive Bi said, "Sorry for the delay but I am on vacation this week so not checking my mail as frequently. I would recommend that she have a dedicated chest ct with contrast if possible in 6 weeks with a visit with me in person.  Thank you.  Tim "  Hope you have a good week, Shawn ----- Message ----- From: Einar Pheasant, MD Sent: 08/10/2018   1:55 PM EDT To: Lieutenant Diego, RN Subject: RE: review Ct                                  Thank you.  Let me know if I need to do anything.    Dr Nicki Reaper ----- Message ----- From: Lieutenant Diego, RN Sent: 08/10/2018   1:46 PM EDT To: Einar Pheasant, MD Subject: RE: review Ct                                  Sure, I'm happy to help.  ----- Message ----- From: Einar Pheasant, MD Sent: 08/10/2018   1:31 PM EDT To: Lieutenant Diego, RN Subject: review Ct                                      This pt had a CT abdomen and pelvis secondary to some ongoing GI issues.  On the abdominal CT, there was note of a lung nodule.  Also in the body of the report, they mentioned a possible lytic lucency - not  mentioned in the impression.  She is following up with GI regarding the GI findings, but do you mind having Dr Genevive Bi review the lung nodule.  The radiologist suggested several different tests that I could order to follow this, but I would like to get his opinion.  Thank you for your help.    Thanks  Dr Nicki Reaper

## 2018-08-17 NOTE — Progress Notes (Signed)
Order placed for CT chest without contrast.

## 2018-08-17 NOTE — Telephone Encounter (Signed)
msg sent back to PCP

## 2018-08-17 NOTE — Telephone Encounter (Signed)
Copied from [facility] [id number]. Topic: General - Call Back - No Documentation >> Aug 17, 2018  9:40 AM Reyne Dumas L wrote: Reason for CRM:   Pt states she is returning a call to [place], she thinks it might be about test she had done.

## 2018-08-22 ENCOUNTER — Other Ambulatory Visit: Payer: Self-pay

## 2018-08-22 ENCOUNTER — Encounter: Payer: Self-pay | Admitting: Internal Medicine

## 2018-08-22 ENCOUNTER — Ambulatory Visit (INDEPENDENT_AMBULATORY_CARE_PROVIDER_SITE_OTHER): Payer: PPO | Admitting: Internal Medicine

## 2018-08-22 DIAGNOSIS — K219 Gastro-esophageal reflux disease without esophagitis: Secondary | ICD-10-CM

## 2018-08-22 DIAGNOSIS — K760 Fatty (change of) liver, not elsewhere classified: Secondary | ICD-10-CM | POA: Diagnosis not present

## 2018-08-22 DIAGNOSIS — I700 Atherosclerosis of aorta: Secondary | ICD-10-CM | POA: Diagnosis not present

## 2018-08-22 DIAGNOSIS — R102 Pelvic and perineal pain: Secondary | ICD-10-CM

## 2018-08-22 DIAGNOSIS — R739 Hyperglycemia, unspecified: Secondary | ICD-10-CM | POA: Diagnosis not present

## 2018-08-22 DIAGNOSIS — N184 Chronic kidney disease, stage 4 (severe): Secondary | ICD-10-CM | POA: Diagnosis not present

## 2018-08-22 DIAGNOSIS — D509 Iron deficiency anemia, unspecified: Secondary | ICD-10-CM

## 2018-08-22 DIAGNOSIS — I10 Essential (primary) hypertension: Secondary | ICD-10-CM

## 2018-08-22 DIAGNOSIS — I351 Nonrheumatic aortic (valve) insufficiency: Secondary | ICD-10-CM | POA: Diagnosis not present

## 2018-08-22 DIAGNOSIS — E7800 Pure hypercholesterolemia, unspecified: Secondary | ICD-10-CM | POA: Diagnosis not present

## 2018-08-22 DIAGNOSIS — R1030 Lower abdominal pain, unspecified: Secondary | ICD-10-CM

## 2018-08-22 NOTE — Progress Notes (Signed)
Patient ID: Caitlin Lin, female   DOB: [birth year], 78 y.o.   MRN: [id number]   Virtual Visit via video Note  This visit type was conducted due to national recommendations for restrictions regarding the COVID-19 pandemic (e.g. social distancing).  This format is felt to be most appropriate for this patient at this time.  All issues noted in this document were discussed and addressed.  No physical exam was performed (except for noted visual exam findings with Video Visits).   I connected with [name] by a video enabled telemedicine application and verified that I am speaking with the correct person using two identifiers. Location patient: home Location provider: work Persons participating in the virtual visit: patient, provider  I discussed the limitations, risks, security and privacy concerns of performing an evaluation and management service by video and the availability of in person appointments. The patient expressed understanding and agreed to proceed.   Reason for visit: scheduled follow up.    HPI: She was recently evaluated for abdominal pain, nausea and gas.  Started on protonix.  Taking bid now.  Decreased gas.  Still with some increased burping.  Saw GI 07/28/18.  Discussed starting reglan.  Also discussed taking stool softener and miralax to help keep bowels moving.  Reflux controlled on protonix.  Had CT scan that revealed question bowel wall thickening/mass versus under distension in the stomach antrum/pylorus, small hiatal hernia.  She reports eating prunes and that this has helped her bowels.  Some nausea - worse if lies on right side/lying down.  She is eating.  Early satiety.  Sleeping ok.  No chest pain.  No sob.  No known covid exposure.  No fever.  Described pelvic pressure.  Fullness in vaginal area.  Desires gyn evaluation.     ROS: See pertinent positives and negatives per HPI.   Past Medical History:  Diagnosis Date  . Chronic kidney disease    Followed by  Dr. Holley Raring  . Constipation due to slow transit   . Diarrhea   . Hyperlipidemia   . Hypertension   . Hypothyroidism   . Melan[place] of [place] leg [place] 2013  . Shingles 2013  . Shingles   . Spinal stenosis in cervical region    Dr. Sharlet Salina  . Subdural hematoma [facility])     Past Surgical History:  Procedure Laterality Date  . ABDOMINAL HYSTERECTOMY  1974  . ANTERIOR AND POSTERIOR VAGINAL REPAIR    . ANTERIOR AND POSTERIOR VAGINAL REPAIR W/ SACROSPINOUS LIGAMENT SUSPENSION Right   . BREAST BIOPSY  1963   Benign per pt's report  . COLONOSCOPY    . ESOPHAGOGASTRODUODENOSCOPY    . ESOPHAGOGASTRODUODENOSCOPY (EGD) WITH PROPOFOL N/A 11/10/2016   Procedure: ESOPHAGOGASTRODUODENOSCOPY (EGD) WITH PROPOFOL;  Surgeon: Lollie Sails, MD;  Location: [facility] ENDOSCOPY;  Service: Endoscopy;  Laterality: N/A;  . LUNG BIOPSY  2007   Benign per pt's report  . LUNG BIOPSY Right   . OOPHORECTOMY  1970  . PERINEOPLASTY  12/17/2013  . sling for stress incontinence  12/18/2011    Family History  Problem Relation Age of Onset  . Arthritis Mother   . Liver disease Mother   . Cirrhosis Mother   . Heart disease Father   . Heart attack Father   . Depression Brother   . Breast cancer Other   . Breast cancer Cousin        maternal cousins    SOCIAL HX: reviewed.    Current Outpatient Medications:  .  amLODipine (NORVASC) 2.5 MG tablet, TAKE 1 TABLET BY MOUTH ONCE DAILY, Disp: 90 tablet, Rfl: 1 .  aspirin 81 MG tablet, Take 1 tablet (81 mg total) by mouth daily., Disp: 30 tablet, Rfl: 3 .  atorvastatin (LIPITOR) 20 MG tablet, TAKE 1 TABLET BY MOUTH ONCE DAILY, Disp: 90 tablet, Rfl: 3 .  CRANBERRY-CALCIUM PO, Take 1 tablet by mouth., Disp: , Rfl:  .  Docusate Sodium (COLACE PO), Take 2 capsules by mouth daily., Disp: , Rfl:  .  fexofenadine-pseudoephedrine (ALLEGRA-D 24) 180-240 MG 24 hr tablet, Take 1 tablet by mouth daily as needed., Disp: 90 tablet, Rfl: 3 .  glucosamine-chondroitin 500-400 MG  tablet, Take 1 tablet by mouth 3 (three) times daily., Disp: 90 tablet, Rfl: 3 .  levothyroxine (SYNTHROID, LEVOTHROID) 75 MCG tablet, Take 1 tablet by mouth once daily with breakfast, Disp: 90 tablet, Rfl: 0 .  losartan (COZAAR) 50 MG tablet, Take 1 tablet by mouth once daily, Disp: 90 tablet, Rfl: 0 .  Multiple Vitamin (MULTIVITAMIN) tablet, Take 1 tablet by mouth daily., Disp: , Rfl:  .  Omega-3 Fatty Acids (FISH OIL) 1000 MG CAPS, Take 1,000 mg by mouth daily., Disp: , Rfl:  .  pantoprazole (PROTONIX) 40 MG tablet, Take 40 mg by mouth daily., Disp: , Rfl:   EXAM:  GENERAL: alert, oriented, appears well and in no acute distress  HEENT: atraumatic, conjunttiva clear, no obvious abnormalities on inspection of external nose and ears  NECK: normal movements of the head and neck  LUNGS: on inspection no signs of respiratory distress, breathing rate appears normal, no obvious gross SOB, gasping or wheezing  CV: no obvious cyanosis  PSYCH/NEURO: pleasant and cooperative, no obvious depression or anxiety, speech and thought processing grossly intact  ASSESSMENT AND PLAN:  Discussed the following assessment and plan:   Abdominal pain Symptoms persist as outlined.  CT as outlined.  Has seen GI.  Continue protonix.  Continue f/u with GI - question of need for EGD.    Anemia, iron deficiency Follow cbc.   Aortic atherosclerosis (HCC) On lipitor.   Aortic regurgitation ECHO 06/2016 - aortic regurgitation.  Recommended f/u echo in 2 years.  Confirm if f/u scheduled    CKD (chronic kidney disease) stage 4, GFR 15-29 ml/min (HCC) Has been followed by nephrology.  Avoid antiinflammatories.  Follow metabolic panel.    Fatty liver Has seen GI.  Follow liver function tests.    GERD (gastroesophageal reflux disease) Controlled on protonix.    Hypercholesterolemia On lipitor.  Follow lipid panel and liver function tests.    Hyperglycemia Follow met b and a1c.   Hypertension Blood  pressure has been under reasonable control.  Follow pressures.  Continue current medication regimen.   Pelvic pressure in female Described lower pelvic pressure.  Persistent.  Had CT recently for her GI issues.  No acute abnormality noted - pelvic.  Will have gyn evaluate.      I discussed the assessment and treatment plan with the patient. The patient was provided an opportunity to ask questions and all were answered. The patient agreed with the plan and demonstrated an understanding of the instructions.   The patient was advised to call back or seek an in-person evaluation if the symptoms worsen or if the condition fails to improve as anticipated.    Einar Pheasant, MD

## 2018-08-26 ENCOUNTER — Ambulatory Visit (INDEPENDENT_AMBULATORY_CARE_PROVIDER_SITE_OTHER): Payer: PPO | Admitting: Cardiothoracic Surgery

## 2018-08-26 ENCOUNTER — Encounter: Payer: Self-pay | Admitting: Cardiothoracic Surgery

## 2018-08-26 ENCOUNTER — Other Ambulatory Visit: Payer: Self-pay

## 2018-08-26 VITALS — BP 147/85 | HR 85 | Temp 97.5°F | Ht 65.0 in | Wt 160.0 lb

## 2018-08-26 DIAGNOSIS — R918 Other nonspecific abnormal finding of lung field: Secondary | ICD-10-CM | POA: Diagnosis not present

## 2018-08-26 NOTE — Patient Instructions (Signed)
Patient scheduled 09/02/2018 @ 8:00.

## 2018-08-26 NOTE — Progress Notes (Signed)
Patient ID: Caitlin Lin, female   DOB: [birth year], 78 y.o.   MRN: [id number]  Chief Complaint  Patient presents with  . Other    lung nodule    Referred By Dr. Einar Pheasant Reason for Referral left lung mass  HPI Location, Quality, Duration, Severity, Timing, Context, Modifying Factors, Associated Signs and Symptoms.  Caitlin Lin is a 78 y.o. female.  Her problems began several months ago with some vague symptoms of abdominal discomfort.  She describes some abdominal pain mostly mid epigastric and this persisted over the course of several weeks.  She describes a sensation of a weakness in her upper abdomen and upper chest.  She presented to Dr. Nicki Reaper who recommended that she get an abdominal CT scan.  That was performed.  The most remarkable finding on that scan was a new left lung lesion.  The lesion itself is incompletely characterized on the abdominal scan.  Of note is that the patient did undergo a right thoracotomy and lung resection several years ago for a benign lesion.  She states that she does not smoke.  She has no history of smoking.  There is no family history of lung cancer.  She has no known risk factors for lung cancer.   Past Medical History:  Diagnosis Date  . Chronic kidney disease    Followed by Dr. Holley Raring  . Constipation due to slow transit   . Diarrhea   . Hyperlipidemia   . Hypertension   . Hypothyroidism   . Melanoma of lower leg ([employer]) 2013  . Shingles 2013  . Shingles   . Spinal stenosis in cervical region    Dr. Sharlet Salina  . Subdural hematoma [facility])     Past Surgical History:  Procedure Laterality Date  . ABDOMINAL HYSTERECTOMY  1974  . ANTERIOR AND POSTERIOR VAGINAL REPAIR    . ANTERIOR AND POSTERIOR VAGINAL REPAIR W/ SACROSPINOUS LIGAMENT SUSPENSION Right   . BREAST BIOPSY  1963   Benign per pt's report  . COLONOSCOPY    . ESOPHAGOGASTRODUODENOSCOPY    . ESOPHAGOGASTRODUODENOSCOPY (EGD) WITH PROPOFOL N/A 11/10/2016   Procedure:  ESOPHAGOGASTRODUODENOSCOPY (EGD) WITH PROPOFOL;  Surgeon: Lollie Sails, MD;  Location: [facility] ENDOSCOPY;  Service: Endoscopy;  Laterality: N/A;  . LUNG BIOPSY  2007   Benign per pt's report  . LUNG BIOPSY Right   . OOPHORECTOMY  1970  . PERINEOPLASTY  12/17/2013  . sling for stress incontinence  12/18/2011    Family History  Problem Relation Age of Onset  . Arthritis Mother   . Liver disease Mother   . Cirrhosis Mother   . Heart disease Father   . Heart attack Father   . Depression Brother   . Breast cancer Other   . Breast cancer Cousin        maternal cousins    Social History Social History   Tobacco Use  . Smoking status: Never Smoker  . Smokeless tobacco: Never Used  Substance Use Topics  . Alcohol use: No  . Drug use: No    Allergies  Allergen Reactions  . Quinapril Other (See Comments)    Hyperkalemia  . Codeine Rash    Current Outpatient Medications  Medication Sig Dispense Refill  . amLODipine (NORVASC) 2.5 MG tablet TAKE 1 TABLET BY MOUTH ONCE DAILY 90 tablet 1  . aspirin 81 MG tablet Take 1 tablet (81 mg total) by mouth daily. 30 tablet 3  . atorvastatin (LIPITOR) 20 MG tablet TAKE 1 TABLET BY  MOUTH ONCE DAILY 90 tablet 3  . CRANBERRY-CALCIUM PO Take 1 tablet by mouth.    Mariane Baumgarten Sodium (COLACE PO) Take 2 capsules by mouth daily.    . fexofenadine-pseudoephedrine (ALLEGRA-D 24) 180-240 MG 24 hr tablet Take 1 tablet by mouth daily as needed. 90 tablet 3  . glucosamine-chondroitin 500-400 MG tablet Take 1 tablet by mouth 3 (three) times daily. 90 tablet 3  . levothyroxine (SYNTHROID, LEVOTHROID) 75 MCG tablet Take 1 tablet by mouth once daily with breakfast 90 tablet 0  . losartan (COZAAR) 50 MG tablet Take 1 tablet by mouth once daily 90 tablet 0  . Multiple Vitamin (MULTIVITAMIN) tablet Take 1 tablet by mouth daily.    . Omega-3 Fatty Acids (FISH OIL) 1000 MG CAPS Take 1,000 mg by mouth daily.    . pantoprazole (PROTONIX) 40 MG tablet Take 40 mg  by mouth daily.     No current facility-administered medications for this visit.       Review of Systems A complete review of systems was asked and was negative except for the following positive findings easy fatigue, kidney stones, incontinence, reflux symptoms, shortness of breath, hypothyroidism, reading glasses, joint pain in her knees.  Blood pressure (!) 147/85, pulse 85, temperature (!) 97.5 F (36.4 C), temperature source Skin, height 5\' 5"  (1.651 m), weight 160 lb (72.6 kg), SpO2 96 %.  Physical Exam CONSTITUTIONAL:  Pleasant, well-developed, well-nourished, and in no acute distress. EYES: Pupils equal and reactive to light, Sclera non-icteric EARS, NOSE, MOUTH AND THROAT:  The oropharynx was clear.  Dentition is good repair.  Oral mucosa pink and moist. LYMPH NODES:  Lymph nodes in the neck and axillae were normal RESPIRATORY:  Lungs were clear.  Normal respiratory effort without pathologic use of accessory muscles of respiration CARDIOVASCULAR: Heart was regular without murmurs.  There were no carotid bruits. GI: The abdomen was soft, nontender, and nondistended. There were no palpable masses. There was no hepatosplenomegaly. There were normal bowel sounds in all quadrants. GU:  Rectal deferred.   MUSCULOSKELETAL:  Normal muscle strength and tone.  No clubbing or cyanosis.   SKIN:  There were no pathologic skin lesions.  There were no nodules on palpation.  Well-healed right thoracotomy scar NEUROLOGIC:  Sensation is normal.  Cranial nerves are grossly intact. PSYCH:  Oriented to person, place and time.  Mood and affect are normal.  Data Reviewed CT scan of the abdomen  I have personally reviewed the patient's imaging, laboratory findings and medical records.    Assessment    I have independently reviewed her CT scan.  In the left lung there is an irregularly-shaped nodule measuring about 1.5 cm.  It is incompletely characterized on the abdominal scan.    Plan    The  patient is scheduled to undergo endoscopy later this month.  I would like to obtain a chest CT with contrast.  She will have this performed next week and a virtual visit will follow.  All of her questions were answered.      Nestor Lewandowsky, MD 08/26/2018, 10:56 AM

## 2018-08-28 ENCOUNTER — Encounter: Payer: Self-pay | Admitting: Internal Medicine

## 2018-08-28 DIAGNOSIS — R102 Pelvic and perineal pain unspecified side: Secondary | ICD-10-CM | POA: Insufficient documentation

## 2018-08-28 NOTE — Assessment & Plan Note (Signed)
Has been followed by nephrology.  Avoid antiinflammatories.  Follow metabolic panel.

## 2018-08-28 NOTE — Assessment & Plan Note (Signed)
On lipitor

## 2018-08-28 NOTE — Assessment & Plan Note (Signed)
Follow cbc.  

## 2018-08-28 NOTE — Assessment & Plan Note (Signed)
Symptoms persist as outlined.  CT as outlined.  Has seen GI.  Continue protonix.  Continue f/u with GI - question of need for EGD.

## 2018-08-28 NOTE — Assessment & Plan Note (Signed)
Has seen GI.  Follow liver function tests.

## 2018-08-28 NOTE — Assessment & Plan Note (Signed)
On lipitor.  Follow lipid panel and liver function tests.   

## 2018-08-28 NOTE — Assessment & Plan Note (Signed)
Follow met b and a1c.  

## 2018-08-28 NOTE — Assessment & Plan Note (Signed)
Blood pressure has been under reasonable control.  Follow pressures.  Continue current medication regimen.

## 2018-08-28 NOTE — Assessment & Plan Note (Signed)
Controlled on protonix.   

## 2018-08-28 NOTE — Assessment & Plan Note (Signed)
Described lower pelvic pressure.  Persistent.  Had CT recently for her GI issues.  No acute abnormality noted - pelvic.  Will have gyn evaluate.

## 2018-08-28 NOTE — Assessment & Plan Note (Signed)
ECHO 06/2016 - aortic regurgitation.  Recommended f/u echo in 2 years.  Confirm if f/u scheduled

## 2018-08-29 DIAGNOSIS — K5900 Constipation, unspecified: Secondary | ICD-10-CM | POA: Diagnosis not present

## 2018-08-29 DIAGNOSIS — R933 Abnormal findings on diagnostic imaging of other parts of digestive tract: Secondary | ICD-10-CM | POA: Diagnosis not present

## 2018-08-29 DIAGNOSIS — R110 Nausea: Secondary | ICD-10-CM | POA: Diagnosis not present

## 2018-09-02 ENCOUNTER — Other Ambulatory Visit: Payer: Self-pay

## 2018-09-02 ENCOUNTER — Ambulatory Visit
Admission: RE | Admit: 2018-09-02 | Discharge: 2018-09-02 | Disposition: A | Discharge status: Home / Self Care | Payer: PPO | Source: Ambulatory Visit | Attending: Internal Medicine | Admitting: Internal Medicine

## 2018-09-02 DIAGNOSIS — R918 Other nonspecific abnormal finding of lung field: Secondary | ICD-10-CM | POA: Diagnosis not present

## 2018-09-02 DIAGNOSIS — R911 Solitary pulmonary nodule: Secondary | ICD-10-CM | POA: Diagnosis not present

## 2018-09-02 DIAGNOSIS — I700 Atherosclerosis of aorta: Secondary | ICD-10-CM | POA: Diagnosis not present

## 2018-09-06 ENCOUNTER — Other Ambulatory Visit: Payer: Self-pay

## 2018-09-06 ENCOUNTER — Telehealth: Payer: Self-pay | Admitting: *Deleted

## 2018-09-06 ENCOUNTER — Telehealth: Payer: Self-pay | Admitting: Internal Medicine

## 2018-09-06 ENCOUNTER — Telehealth (INDEPENDENT_AMBULATORY_CARE_PROVIDER_SITE_OTHER): Payer: PPO | Admitting: Cardiothoracic Surgery

## 2018-09-06 ENCOUNTER — Other Ambulatory Visit
Admission: RE | Admit: 2018-09-06 | Discharge: 2018-09-06 | Disposition: A | Discharge status: Home / Self Care | Payer: PPO | Source: Ambulatory Visit | Attending: Gastroenterology | Admitting: Gastroenterology

## 2018-09-06 DIAGNOSIS — R918 Other nonspecific abnormal finding of lung field: Secondary | ICD-10-CM

## 2018-09-06 DIAGNOSIS — Z1159 Encounter for screening for other viral diseases: Secondary | ICD-10-CM | POA: Insufficient documentation

## 2018-09-06 DIAGNOSIS — R9389 Abnormal findings on diagnostic imaging of other specified body structures: Secondary | ICD-10-CM

## 2018-09-06 NOTE — Telephone Encounter (Signed)
Patient was contacted today and notified that Dr. Genevive Bi would like for her to have a PET scan.   The patient's PET scan has been scheduled for 09-14-18 at 8:30 am (arrive 8 am). Prep: NPO after midnight.   Patient has also been scheduled for a face to face visit on 09-19-18 at 9 am.   The patient verbalizes understanding of the above.

## 2018-09-06 NOTE — Progress Notes (Signed)
Virtual Visit via Telephone Note  I connected with@ on 09/06/18 at 10:00 AM EDT by telephone and verified that I am speaking with the correct person using two identifiers.   I discussed the limitations, risks, security and privacy concerns of performing an evaluation and management service by telephone and the availability of in person appointments. I also discussed with the patient that there may be a patient responsible charge related to this service. The patient expressed understanding and agreed to proceed.  This service was provided via telemedicine.  The patient consented to the visit being carried via telemedicine.  Patient's location: Home  Provider's location: Office  Referring Provider: Dr. Einar Pheasant  People participating in this telemedicine visit: Me and the patient   History of Present Illness: I had the opportunity to see Mrs. Dasch after an abdominal CT scan showed a possible lesion in the left lung.  She has a prior history of a thoracotomy on the right with resection of a benign tumor approximately 10 years ago.  She had the abdominal scan done for some upper abdominal pain and she is scheduled to undergo an EGD later this week.  When I saw her I recommended that we obtain a CT scan of the chest which she has done.  She states that overall she has no symptoms relative to her heart or lungs and states that her reflux type symptoms have improved overall.    Observations/Objective: I have independently reviewed the patient's CT scan.  There are 2 lesions present on the scan.  One is in the left upper lobe lingular segment.  There is a multinodular area.  There is a similar finding on the right with some calcifications.  Although none of these are obvious malignancies they certainly require additional follow-up.   Assessment and Plan: I explained to Mrs. Bollier that I would like to obtain a PET scan.  She understands the rationale for that.  We will schedule that next week  and we will schedule her to come back and visit with me again so that I can review the studies with her.  She is agreeable to doing so.     I discussed the assessment and treatment plan with the patient. The patient was provided an opportunity to ask questions and all were answered. The patient agreed with the plan and demonstrated an understanding of the instructions.   The patient was advised to call back or seek an in-person evaluation if the symptoms worsen or if the condition fails to improve as anticipated.  I provided 15 minutes of non-face-to-face time during this encounter.   Nestor Lewandowsky, MD

## 2018-09-06 NOTE — Telephone Encounter (Signed)
-----   Message from Lieutenant Diego, RN sent at 09/06/2018 10:16 AM EDT ----- Regarding: RE: question Dr. Genevive Bi said that was no problem and he would take care of it. Shawn ----- Message ----- From: Einar Pheasant, MD Sent: 09/06/2018   3:58 AM EDT To: Lieutenant Diego, RN Subject: question                                       Dr Genevive Bi had recommended a chest CT and then a f/u with him.  Ms Claudio has a follow up scheduled with Dr Genevive Bi 09/09/18.  The CT results are in the chart.  She is scheduled for an EGD 09/09/18 - per chart.  Do you mind having him review the CT prior to her EGD and confirm no reason (from his review) that we need to postpone the EGD.  Just let me know and I will do whatever I need to do.  Thank you for your help.    Einar Pheasant

## 2018-09-06 NOTE — Telephone Encounter (Signed)
-----   Message from Nestor Lewandowsky, MD sent at 09/06/2018 10:23 AM EDT ----- Please schedule this patient for a PET scan and a face to face visit with me when I return to the office.  Thanks.  Tim

## 2018-09-08 ENCOUNTER — Encounter: Payer: Self-pay | Admitting: *Deleted

## 2018-09-08 LAB — NOVEL CORONAVIRUS, NAA (HOSP ORDER, SEND-OUT TO REF LAB; TAT 18-24 HRS): SARS-CoV-2, NAA: NOT DETECTED

## 2018-09-09 ENCOUNTER — Other Ambulatory Visit: Payer: Self-pay | Admitting: Gastroenterology

## 2018-09-09 ENCOUNTER — Encounter
Admission: RE | Disposition: A | Discharge status: Home / Self Care | Payer: Self-pay | Source: Home / Self Care | Attending: Gastroenterology

## 2018-09-09 ENCOUNTER — Ambulatory Visit: Payer: PPO | Admitting: Anesthesiology

## 2018-09-09 ENCOUNTER — Telehealth: Payer: PPO | Admitting: Cardiothoracic Surgery

## 2018-09-09 ENCOUNTER — Ambulatory Visit
Admission: RE | Admit: 2018-09-09 | Discharge: 2018-09-09 | Disposition: A | Discharge status: Home / Self Care | Payer: PPO | Attending: Gastroenterology | Admitting: Gastroenterology

## 2018-09-09 DIAGNOSIS — E039 Hypothyroidism, unspecified: Secondary | ICD-10-CM | POA: Insufficient documentation

## 2018-09-09 DIAGNOSIS — K2970 Gastritis, unspecified, without bleeding: Secondary | ICD-10-CM | POA: Diagnosis not present

## 2018-09-09 DIAGNOSIS — R933 Abnormal findings on diagnostic imaging of other parts of digestive tract: Secondary | ICD-10-CM | POA: Diagnosis not present

## 2018-09-09 DIAGNOSIS — K2950 Unspecified chronic gastritis without bleeding: Secondary | ICD-10-CM | POA: Insufficient documentation

## 2018-09-09 DIAGNOSIS — Z79899 Other long term (current) drug therapy: Secondary | ICD-10-CM | POA: Insufficient documentation

## 2018-09-09 DIAGNOSIS — Z79890 Hormone replacement therapy (postmenopausal): Secondary | ICD-10-CM | POA: Diagnosis not present

## 2018-09-09 DIAGNOSIS — K3189 Other diseases of stomach and duodenum: Secondary | ICD-10-CM | POA: Diagnosis not present

## 2018-09-09 DIAGNOSIS — K317 Polyp of stomach and duodenum: Secondary | ICD-10-CM | POA: Diagnosis not present

## 2018-09-09 DIAGNOSIS — I129 Hypertensive chronic kidney disease with stage 1 through stage 4 chronic kidney disease, or unspecified chronic kidney disease: Secondary | ICD-10-CM | POA: Diagnosis not present

## 2018-09-09 DIAGNOSIS — E7800 Pure hypercholesterolemia, unspecified: Secondary | ICD-10-CM | POA: Diagnosis not present

## 2018-09-09 DIAGNOSIS — N189 Chronic kidney disease, unspecified: Secondary | ICD-10-CM | POA: Insufficient documentation

## 2018-09-09 DIAGNOSIS — I252 Old myocardial infarction: Secondary | ICD-10-CM | POA: Diagnosis not present

## 2018-09-09 DIAGNOSIS — Z7982 Long term (current) use of aspirin: Secondary | ICD-10-CM | POA: Diagnosis not present

## 2018-09-09 DIAGNOSIS — Z85820 Personal history of malignant melanoma of skin: Secondary | ICD-10-CM | POA: Insufficient documentation

## 2018-09-09 DIAGNOSIS — E785 Hyperlipidemia, unspecified: Secondary | ICD-10-CM | POA: Diagnosis not present

## 2018-09-09 DIAGNOSIS — K219 Gastro-esophageal reflux disease without esophagitis: Secondary | ICD-10-CM | POA: Insufficient documentation

## 2018-09-09 DIAGNOSIS — R1011 Right upper quadrant pain: Secondary | ICD-10-CM

## 2018-09-09 DIAGNOSIS — D131 Benign neoplasm of stomach: Secondary | ICD-10-CM | POA: Diagnosis not present

## 2018-09-09 DIAGNOSIS — N184 Chronic kidney disease, stage 4 (severe): Secondary | ICD-10-CM | POA: Diagnosis not present

## 2018-09-09 DIAGNOSIS — K449 Diaphragmatic hernia without obstruction or gangrene: Secondary | ICD-10-CM | POA: Diagnosis not present

## 2018-09-09 DIAGNOSIS — Z8719 Personal history of other diseases of the digestive system: Secondary | ICD-10-CM | POA: Diagnosis not present

## 2018-09-09 DIAGNOSIS — R1013 Epigastric pain: Secondary | ICD-10-CM | POA: Diagnosis not present

## 2018-09-09 DIAGNOSIS — K2960 Other gastritis without bleeding: Secondary | ICD-10-CM | POA: Diagnosis not present

## 2018-09-09 HISTORY — DX: Polyp of colon: K63.5

## 2018-09-09 HISTORY — DX: Unspecified cataract: H26.9

## 2018-09-09 HISTORY — DX: Disorder of thyroid, unspecified: E07.9

## 2018-09-09 HISTORY — DX: Gastro-esophageal reflux disease without esophagitis: K21.9

## 2018-09-09 HISTORY — DX: Personal history of diseases of the skin and subcutaneous tissue: Z87.2

## 2018-09-09 HISTORY — PX: ESOPHAGOGASTRODUODENOSCOPY (EGD) WITH PROPOFOL: SHX5813

## 2018-09-09 SURGERY — ESOPHAGOGASTRODUODENOSCOPY (EGD) WITH PROPOFOL
Anesthesia: General

## 2018-09-09 MED ORDER — LIDOCAINE HCL (CARDIAC) PF 100 MG/5ML IV SOSY
PREFILLED_SYRINGE | INTRAVENOUS | Status: DC | PRN
  Administered 2018-09-09: 35 mg via INTRAVENOUS

## 2018-09-09 MED ORDER — PROPOFOL 500 MG/50ML IV EMUL
INTRAVENOUS | Status: DC | PRN
  Administered 2018-09-09: 150 ug/kg/min via INTRAVENOUS

## 2018-09-09 MED ORDER — PROPOFOL 10 MG/ML IV BOLUS
INTRAVENOUS | Status: DC | PRN
  Administered 2018-09-09: 50 mg via INTRAVENOUS

## 2018-09-09 MED ORDER — SODIUM CHLORIDE 0.9 % IV SOLN
INTRAVENOUS | Status: DC
  Administered 2018-09-09: 09:00:00 via INTRAVENOUS

## 2018-09-09 NOTE — Op Note (Signed)
[facility] Gastroenterology Patient Name: Caitlin Lin Procedure Date: 09/09/2018 10:02 AM MRN: [id number] Account #: [id number] Date of Birth: [birth year] Admit Type: Outpatient Age: 78 Room: [facility] ENDO ROOM 2 Gender: Female Note Status: Finalized Procedure:            Upper GI endoscopy Indications:          Epigastric abdominal pain, Abnormal CT of the GI tract Providers:            Lollie Sails, MD Medicines:            Monitored Anesthesia Care Complications:        No immediate complications. Procedure:            Pre-Anesthesia Assessment:                       - ASA Grade Assessment: III - A patient with severe                        systemic disease.                       After obtaining informed consent, the endoscope was                        passed under direct vision. Throughout the procedure,                        the patient's blood pressure, pulse, and oxygen                        saturations were monitored continuously. The Endoscope                        was introduced through the mouth, and advanced to the                        third part of duodenum. The upper GI endoscopy was                        accomplished without difficulty. The patient tolerated                        the procedure well. Findings:      The Z-line was variable. Biopsies were taken with a cold forceps for       histology.      A small to medium size sliding hiatal hernia was present.      Minimal erythematous mucosa without bleeding was found in the gastric       body.      Biopsies were taken with a cold forceps in the gastric body and in the       gastric antrum for histology.      Multiple small sessile polyps with no bleeding and no stigmata of recent       bleeding were found in the gastric body. Biopsies were taken with a cold       forceps for histology.      The cardia and gastric fundus were normal on retroflexion otherwise.      The examined  duodenum was normal. Of note there is no anomally in the       distal stomach/pylorus  area, as noted on CT. Impression:           - Z-line variable. Biopsied.                       - Small to medium size sliding hiatal hernia.                       - Erythematous mucosa in the gastric body.                       - Multiple gastric polyps. Biopsied.                       - Normal examined duodenum.                       - Biopsies were taken with a cold forceps for histology                        in the gastric body and in the gastric antrum. Recommendation:       - Discharge patient to home.                       - Continue present medications.                       - Perform an abdominal ultrasound at appointment to be                        scheduled.                       - Perform a HIDA (hepatobiliary iminodiacetic acid)                        scan at appointment to be scheduled. Procedure Code(s):    --- Professional ---                       [phone], Esophagogastroduodenoscopy, flexible, transoral;                        with biopsy, single or multiple Diagnosis Code(s):    --- Professional ---                       K22.8, Other specified diseases of esophagus                       K44.9, Diaphragmatic hernia without obstruction or                        gangrene                       K31.89, Other diseases of stomach and duodenum                       K31.7, Polyp of stomach and duodenum                       R10.13, Epigastric pain  R93.3, Abnormal findings on diagnostic imaging of other                        parts of digestive tract CPT copyright 2019 [employer]. All rights reserved. The codes documented in this report are preliminary and upon coder review may  be revised to meet current compliance requirements. Lollie Sails, MD 09/09/2018 10:42:17 AM This report has been signed electronically. Number of Addenda: 0 Note Initiated  On: 09/09/2018 10:02 AM      [facility]

## 2018-09-09 NOTE — Anesthesia Preprocedure Evaluation (Addendum)
Anesthesia Evaluation  Patient identified by MRN, date of birth, ID band Patient awake    Reviewed: Allergy & Precautions, H&P , NPO status , Patient's Chart, lab work & pertinent test results  Airway Mallampati: II  TM Distance: >3 FB     Dental   Pulmonary neg pulmonary ROS, neg shortness of breath, neg COPD, neg recent URI,           Cardiovascular hypertension, (-) angina+ Past MI  (-) Cardiac Stents (-) dysrhythmias      Neuro/Psych  Headaches, negative psych ROS   GI/Hepatic Neg liver ROS, GERD  Controlled,  Endo/Other  Hypothyroidism   Renal/GU CRFRenal disease  negative genitourinary   Musculoskeletal   Abdominal   Peds  Hematology  (+) Blood dyscrasia, anemia ,   Anesthesia Other Findings Past Medical History: No date: Cataracts, bilateral No date: Chronic kidney disease     Comment:  Followed by Dr. Holley Raring No date: Chronic kidney disease No date: Colon polyp No date: Constipation due to slow transit No date: Diarrhea No date: Diarrhea No date: GERD (gastroesophageal reflux disease) No date: History of cyst of breast No date: Hyperlipidemia No date: Hypertension No date: Hypothyroidism 2013: Melanoma of lower leg ([place]) 2013: Shingles No date: Shingles No date: Spinal stenosis in cervical region     Comment:  Dr. Sharlet Salina No date: Subdural hematoma ([place]) No date: Thyroid disease  Past Surgical History: 1974: ABDOMINAL HYSTERECTOMY No date: ANTERIOR AND POSTERIOR VAGINAL REPAIR No date: ANTERIOR AND POSTERIOR VAGINAL REPAIR W/ SACROSPINOUS  LIGAMENT SUSPENSION; Right 1963: BREAST BIOPSY     Comment:  Benign per pt's report No date: COLONOSCOPY No date: ESOPHAGOGASTRODUODENOSCOPY 11/10/2016: ESOPHAGOGASTRODUODENOSCOPY (EGD) WITH PROPOFOL; N/A     Comment:  Procedure: ESOPHAGOGASTRODUODENOSCOPY (EGD) WITH               PROPOFOL;  Surgeon: Lollie Sails, MD;  Location:               [facility]  ENDOSCOPY;  Service: Endoscopy;  Laterality: N/A; 2007: LUNG BIOPSY     Comment:  Benign per pt's report No date: LUNG BIOPSY; Right 1970: OOPHORECTOMY 12/17/2013: PERINEOPLASTY 12/18/2011: sling for stress incontinence     Reproductive/Obstetrics negative OB ROS                           Anesthesia Physical Anesthesia Plan  ASA: III  Anesthesia Plan: General   Post-op Pain Management:    Induction:   PONV Risk Score and Plan: Propofol infusion and TIVA  Airway Management Planned: Natural Airway and Nasal Cannula  Additional Equipment:   Intra-op Plan:   Post-operative Plan:   Informed Consent: I have reviewed the patients History and Physical, chart, labs and discussed the procedure including the risks, benefits and alternatives for the proposed anesthesia with the patient or authorized representative who has indicated his/her understanding and acceptance.     Dental Advisory Given  Plan Discussed with: Anesthesiologist and CRNA  Anesthesia Plan Comments:         Anesthesia Quick Evaluation

## 2018-09-09 NOTE — H&P (Signed)
Outpatient short stay form Pre-procedure 09/09/2018 10:08 AM Lollie Sails MD   Primary Physician: Dr. Einar Pheasant  Reason for visit: EGD  History of present illness: Patient is a 78 year old female presenting today for an EGD in regards to history of epigastric pain.  Been in the upper epigastric region and is atypical in nature.  She also however describes a mild right upper quadrant pain that will at times radiate into the area under the left right shoulder blade.  Has had ultrasound in the past but has not had a HIDA CCK study.  She had a CT scan of the abdomen that showed a questionable bowel wall thickening/mass appearance in the versus under distention in the stomach antrum area.  Other evaluation was suggested.  She also is noted to have a small hiatal hernia.  He takes no aspirin or blood thinning agent with the exception of the 81 mg aspirin that was held for today's procedure.  She has been taking Protonix 40 mg once a day in the morning as well as 5 mg of Reglan twice a day.  Her symptoms seem to have improved somewhat in the past couple of weeks.    Current Facility-Administered Medications:  .  0.9 %  sodium chloride infusion, , Intravenous, Continuous, Lollie Sails, MD, Last Rate: 20 mL/hr at 09/09/18 4765  Medications Prior to Admission  Medication Sig Dispense Refill Last Dose  . amLODipine (NORVASC) 2.5 MG tablet TAKE 1 TABLET BY MOUTH ONCE DAILY 90 tablet 1 09/09/2018 at Unknown time  . aspirin 81 MG tablet Take 1 tablet (81 mg total) by mouth daily. 30 tablet 3 09/08/2018 at Unknown time  . atorvastatin (LIPITOR) 20 MG tablet TAKE 1 TABLET BY MOUTH ONCE DAILY 90 tablet 3 09/09/2018 at Unknown time  . Calcium Carb-Cholecalciferol (CALCIUM CARBONATE-VITAMIN D3 PO) Take by mouth daily.   Past Week at Unknown time  . CHOLECALCIFEROL PO Take 1,000 Units by mouth daily.   Past Week at Unknown time  . CRANBERRY-CALCIUM PO Take 1 tablet by mouth.   Past Week at Unknown time   . Docusate Sodium (COLACE PO) Take 2 capsules by mouth daily.   Past Month at Unknown time  . fexofenadine-pseudoephedrine (ALLEGRA-D 24) 180-240 MG 24 hr tablet Take 1 tablet by mouth daily as needed. 90 tablet 3 Past Week at Unknown time  . gabapentin (NEURONTIN) 100 MG capsule Take 100 mg by mouth 3 (three) times daily.   Past Week at Unknown time  . glucosamine-chondroitin 500-400 MG tablet Take 1 tablet by mouth 3 (three) times daily. 90 tablet 3 Past Week at Unknown time  . ipratropium (ATROVENT) 0.03 % nasal spray Place 2 sprays into both nostrils every 12 (twelve) hours.   Past Week at Unknown time  . levothyroxine (SYNTHROID, LEVOTHROID) 75 MCG tablet Take 1 tablet by mouth once daily with breakfast 90 tablet 0 09/08/2018 at Unknown time  . losartan (COZAAR) 50 MG tablet Take 1 tablet by mouth once daily 90 tablet 0 09/08/2018 at Unknown time  . metoCLOPramide (REGLAN) 5 MG tablet Take 5 mg by mouth 2 (two) times daily.   09/08/2018 at Unknown time  . Multiple Vitamin (MULTIVITAMIN) tablet Take 1 tablet by mouth daily.   Past Week at Unknown time  . pantoprazole (PROTONIX) 40 MG tablet Take 40 mg by mouth daily.   09/08/2018 at Unknown time  . traMADol (ULTRAM) 50 MG tablet Take by mouth 2 (two) times daily as needed.   Past  Week at Unknown time  . Omega-3 Fatty Acids (FISH OIL) 1000 MG CAPS Take 1,000 mg by mouth daily.     . quinapril (ACCUPRIL) 20 MG tablet Take 20 mg by mouth daily.   Not Taking at Unknown time     Allergies  Allergen Reactions  . Quinapril Other (See Comments)    Hyperkalemia  . Codeine Rash     Past Medical History:  Diagnosis Date  . Cataracts, bilateral   . Chronic kidney disease    Followed by Dr. Holley Raring  . Chronic kidney disease   . Colon polyp   . Constipation due to slow transit   . Diarrhea   . Diarrhea   . GERD (gastroesophageal reflux disease)   . History of cyst of breast   . Hyperlipidemia   . Hypertension   . Hypothyroidism   . Melanoma  of lower leg ([place]) 2013  . Shingles 2013  . Shingles   . Spinal stenosis in cervical region    Dr. Sharlet Salina  . Subdural hematoma ([place])   . Thyroid disease     Review of systems:      Physical Exam    Heart and lungs: Regular rate and rhythm without rub or gallop, lungs are bilaterally clear.    HEENT: Normocephalic atraumatic eyes are anicteric    Other:    Pertinant exam for procedure: Soft nontender nondistended bowel sounds positive normoactive.  Mild discomfort to palpation the right upper quadrant.  Masses or rebound.    Planned proceedures: EGD and indicated procedures. I have discussed the risks benefits and complications of procedures to include not limited to bleeding, infection, perforation and the risk of sedation and the patient wishes to proceed.    Lollie Sails, MD Gastroenterology 09/09/2018  10:08 AM

## 2018-09-09 NOTE — Transfer of Care (Signed)
Immediate Anesthesia Transfer of Care Note  Patient: Caitlin Lin  Procedure(s) Performed: ESOPHAGOGASTRODUODENOSCOPY (EGD) WITH PROPOFOL (N/A )  Patient Location: PACU and Endoscopy Unit  Anesthesia Type:General  Level of Consciousness: awake  Airway & Oxygen Therapy: Patient Spontanous Breathing  Post-op Assessment: Report given to RN  Post vital signs: stable  Last Vitals:  Vitals Value Taken Time  BP 122/72 09/09/18 1043  Temp 35.8 C 09/09/18 1042  Pulse 80 09/09/18 1043  Resp 21 09/09/18 1043  SpO2 96 % 09/09/18 1043  Vitals shown include unvalidated device data.  Last Pain:  Vitals:   09/09/18 0914  TempSrc: Tympanic  PainSc: 0-No pain         Complications: No apparent anesthesia complications

## 2018-09-09 NOTE — Anesthesia Post-op Follow-up Note (Signed)
Anesthesia QCDR form completed.        

## 2018-09-09 NOTE — Anesthesia Postprocedure Evaluation (Signed)
Anesthesia Post Note  Patient: Caitlin Lin  Procedure(s) Performed: ESOPHAGOGASTRODUODENOSCOPY (EGD) WITH PROPOFOL (N/A )  Patient location during evaluation: PACU Anesthesia Type: General Level of consciousness: awake and alert Pain management: pain level controlled Vital Signs Assessment: post-procedure vital signs reviewed and stable Respiratory status: spontaneous breathing, nonlabored ventilation and respiratory function stable Cardiovascular status: blood pressure returned to baseline and stable Postop Assessment: no apparent nausea or vomiting Anesthetic complications: no     Last Vitals:  Vitals:   09/09/18 0914 09/09/18 1042  BP: (!) 147/81 122/72  Pulse: 83   Resp: 18   Temp: (!) 35.9 C (!) 35.8 C  SpO2: 100%     Last Pain:  Vitals:   09/09/18 1102  TempSrc:   PainSc: 0-No pain                 Durenda Hurt

## 2018-09-12 DIAGNOSIS — R102 Pelvic and perineal pain: Secondary | ICD-10-CM | POA: Diagnosis not present

## 2018-09-12 DIAGNOSIS — N952 Postmenopausal atrophic vaginitis: Secondary | ICD-10-CM | POA: Diagnosis not present

## 2018-09-12 LAB — SURGICAL PATHOLOGY

## 2018-09-14 ENCOUNTER — Other Ambulatory Visit: Payer: Self-pay

## 2018-09-14 ENCOUNTER — Ambulatory Visit
Admission: RE | Admit: 2018-09-14 | Discharge: 2018-09-14 | Disposition: A | Discharge status: Home / Self Care | Payer: PPO | Source: Ambulatory Visit | Attending: Cardiothoracic Surgery | Admitting: Cardiothoracic Surgery

## 2018-09-14 DIAGNOSIS — R9389 Abnormal findings on diagnostic imaging of other specified body structures: Secondary | ICD-10-CM | POA: Insufficient documentation

## 2018-09-14 DIAGNOSIS — R918 Other nonspecific abnormal finding of lung field: Secondary | ICD-10-CM | POA: Diagnosis not present

## 2018-09-14 DIAGNOSIS — R911 Solitary pulmonary nodule: Secondary | ICD-10-CM | POA: Diagnosis not present

## 2018-09-14 LAB — GLUCOSE, CAPILLARY: Glucose-Capillary: 98 mg/dL (ref 70–99)

## 2018-09-14 MED ORDER — FLUDEOXYGLUCOSE F - 18 (FDG) INJECTION
8.3000 | Freq: Once | INTRAVENOUS | Status: AC | PRN
  Administered 2018-09-14: 8.9 via INTRAVENOUS

## 2018-09-15 ENCOUNTER — Other Ambulatory Visit: Payer: Self-pay | Admitting: Internal Medicine

## 2018-09-19 ENCOUNTER — Encounter: Payer: Self-pay | Admitting: Cardiothoracic Surgery

## 2018-09-19 ENCOUNTER — Ambulatory Visit (INDEPENDENT_AMBULATORY_CARE_PROVIDER_SITE_OTHER): Payer: PPO | Admitting: Cardiothoracic Surgery

## 2018-09-19 ENCOUNTER — Other Ambulatory Visit: Payer: Self-pay

## 2018-09-19 ENCOUNTER — Ambulatory Visit: Payer: PPO | Admitting: Cardiothoracic Surgery

## 2018-09-19 VITALS — BP 183/90 | HR 76 | Temp 97.7°F | Resp 14 | Ht 65.0 in | Wt 163.0 lb

## 2018-09-19 DIAGNOSIS — R918 Other nonspecific abnormal finding of lung field: Secondary | ICD-10-CM | POA: Diagnosis not present

## 2018-09-19 DIAGNOSIS — R9389 Abnormal findings on diagnostic imaging of other specified body structures: Secondary | ICD-10-CM

## 2018-09-19 NOTE — Progress Notes (Signed)
  Patient ID: Caitlin Lin, female   DOB: 15-Mar-1941, 78 y.o.   MRN: [id number]  HISTORY: This patient returns today in follow-up.  She states that she has been more short of breath than usual but she attributes this to being nervous about the potential diagnoses.  She has smoked in the past but has not smoked for over 20 years.   Vitals:   09/19/18 1357  BP: (!) 183/90  Pulse: 76  Resp: 14  Temp: 97.7 F (36.5 C)  SpO2: 96%     EXAM:    Resp: Lungs are clear bilaterally.  No respiratory distress, normal effort. Heart:  Regular without murmurs Abd:  Abdomen is soft, non distended and non tender. No masses are palpable.  There is no rebound and no guarding.  Neurological: Alert and oriented to person, place, and time. Coordination normal.  Skin: Skin is warm and dry. No rash noted. No diaphoretic. No erythema. No pallor.  Psychiatric: Normal mood and affect. Normal behavior. Judgment and thought content normal.    ASSESSMENT: I have independently reviewed the patient's PET scan.  There is mild hypermetabolic activity in both the right and left lung areas in question.  The appearance of these however does not appear to be that of a malignancy.   PLAN:   I reviewed with her the options including a percutaneous biopsy or surveillance.  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  This was back in 2008.  The appearance of these lesions that are currently seen on the scan suggest more of an inflammatory process.  I explained this to her and she would like to just have a CT scan performed in 3 months for follow-up.    Nestor Lewandowsky, MD

## 2018-09-19 NOTE — Patient Instructions (Signed)
CT Chest w/o contrast scheduled 12/19/2018.  Please see appointment listed below.

## 2018-09-22 ENCOUNTER — Ambulatory Visit
Admission: RE | Admit: 2018-09-22 | Discharge: 2018-09-22 | Disposition: A | Discharge status: Home / Self Care | Payer: PPO | Source: Ambulatory Visit | Attending: Gastroenterology | Admitting: Gastroenterology

## 2018-09-22 ENCOUNTER — Encounter
Admission: RE | Admit: 2018-09-22 | Discharge: 2018-09-22 | Disposition: A | Discharge status: Home / Self Care | Payer: PPO | Source: Ambulatory Visit | Attending: Gastroenterology | Admitting: Gastroenterology

## 2018-09-22 ENCOUNTER — Other Ambulatory Visit: Payer: Self-pay

## 2018-09-22 DIAGNOSIS — R1011 Right upper quadrant pain: Secondary | ICD-10-CM | POA: Insufficient documentation

## 2018-09-22 MED ORDER — TECHNETIUM TC 99M MEBROFENIN IV KIT
5.0000 | PACK | Freq: Once | INTRAVENOUS | Status: AC | PRN
  Administered 2018-09-22: 4.787 via INTRAVENOUS

## 2018-09-23 ENCOUNTER — Other Ambulatory Visit: Payer: PPO

## 2018-09-23 ENCOUNTER — Other Ambulatory Visit: Payer: Self-pay | Admitting: Internal Medicine

## 2018-09-23 DIAGNOSIS — Z1231 Encounter for screening mammogram for malignant neoplasm of breast: Secondary | ICD-10-CM

## 2018-10-10 DIAGNOSIS — K5909 Other constipation: Secondary | ICD-10-CM | POA: Diagnosis not present

## 2018-10-10 DIAGNOSIS — K219 Gastro-esophageal reflux disease without esophagitis: Secondary | ICD-10-CM | POA: Diagnosis not present

## 2018-11-03 ENCOUNTER — Other Ambulatory Visit: Payer: Self-pay

## 2018-11-03 ENCOUNTER — Ambulatory Visit
Admission: RE | Admit: 2018-11-03 | Discharge: 2018-11-03 | Disposition: A | Discharge status: Home / Self Care | Payer: PPO | Source: Ambulatory Visit | Attending: Internal Medicine | Admitting: Internal Medicine

## 2018-11-03 DIAGNOSIS — Z1231 Encounter for screening mammogram for malignant neoplasm of breast: Secondary | ICD-10-CM | POA: Insufficient documentation

## 2018-11-22 ENCOUNTER — Ambulatory Visit (INDEPENDENT_AMBULATORY_CARE_PROVIDER_SITE_OTHER): Payer: PPO

## 2018-11-22 ENCOUNTER — Ambulatory Visit (INDEPENDENT_AMBULATORY_CARE_PROVIDER_SITE_OTHER): Payer: PPO | Admitting: Internal Medicine

## 2018-11-22 ENCOUNTER — Other Ambulatory Visit: Payer: Self-pay

## 2018-11-22 DIAGNOSIS — K219 Gastro-esophageal reflux disease without esophagitis: Secondary | ICD-10-CM

## 2018-11-22 DIAGNOSIS — I351 Nonrheumatic aortic (valve) insufficiency: Secondary | ICD-10-CM

## 2018-11-22 DIAGNOSIS — G479 Sleep disorder, unspecified: Secondary | ICD-10-CM | POA: Diagnosis not present

## 2018-11-22 DIAGNOSIS — Z0000 Encounter for general adult medical examination without abnormal findings: Secondary | ICD-10-CM | POA: Diagnosis not present

## 2018-11-22 DIAGNOSIS — E7800 Pure hypercholesterolemia, unspecified: Secondary | ICD-10-CM | POA: Diagnosis not present

## 2018-11-22 DIAGNOSIS — R1030 Lower abdominal pain, unspecified: Secondary | ICD-10-CM

## 2018-11-22 DIAGNOSIS — I10 Essential (primary) hypertension: Secondary | ICD-10-CM

## 2018-11-22 DIAGNOSIS — N184 Chronic kidney disease, stage 4 (severe): Secondary | ICD-10-CM

## 2018-11-22 DIAGNOSIS — D509 Iron deficiency anemia, unspecified: Secondary | ICD-10-CM | POA: Diagnosis not present

## 2018-11-22 DIAGNOSIS — R739 Hyperglycemia, unspecified: Secondary | ICD-10-CM | POA: Diagnosis not present

## 2018-11-22 DIAGNOSIS — K760 Fatty (change of) liver, not elsewhere classified: Secondary | ICD-10-CM | POA: Diagnosis not present

## 2018-11-22 DIAGNOSIS — I700 Atherosclerosis of aorta: Secondary | ICD-10-CM

## 2018-11-22 DIAGNOSIS — R200 Anesthesia of skin: Secondary | ICD-10-CM | POA: Diagnosis not present

## 2018-11-22 NOTE — Progress Notes (Addendum)
Subjective:   Caitlin Lin is a 78 y.o. female who presents for Medicare Annual (Subsequent) preventive examination.  Review of Systems:  No ROS.  Medicare Wellness Virtual Visit.  Visual/audio telehealth visit, UTA vital signs.   See social history for additional risk factors.   Cardiac Risk Factors include: advanced age (>68men, >80 women);hypertension     Objective:     Vitals: There were no vitals taken for this visit.  There is no height or weight on file to calculate BMI.  Advanced Directives 11/22/2018 09/09/2018 09/17/2017 11/10/2016 09/24/2014  Does Patient Have a Medical Advance Directive? Yes Yes Yes Yes Yes  Type of Paramedic of [place];Living will Living will [place];Living will Living will [place];Living will  Does patient want to make changes to medical advance directive? No - Patient declined - No - Patient declined - -  Copy of [place] in Chart? Yes - validated most recent copy scanned in chart (See row information) - Yes - No - copy requested    Tobacco Social History   Tobacco Use  Smoking Status Never Smoker  Smokeless Tobacco Never Used     Counseling given: Not Answered   Clinical Intake:  Pre-visit preparation completed: Yes        Diabetes: No  How often do you need to have someone help you when you read instructions, pamphlets, or other written materials from your doctor or pharmacy?: 1 - Never  Interpreter Needed?: No     Past Medical History:  Diagnosis Date  . Cataracts, bilateral   . Chronic kidney disease    Followed by Dr. Holley Raring  . Chronic kidney disease   . Colon polyp   . Constipation due to slow transit   . Diarrhea   . Diarrhea   . GERD (gastroesophageal reflux disease)   . History of cyst of breast   . Hyperlipidemia   . Hypertension   . Hypothyroidism   . Melanoma of lower leg ([place]) 2013  . Shingles 2013  . Shingles   .  Spinal stenosis in cervical region    Dr. Sharlet Salina  . Subdural hematoma ([place])   . Thyroid disease    Past Surgical History:  Procedure Laterality Date  . ABDOMINAL HYSTERECTOMY  1974  . ANTERIOR AND POSTERIOR VAGINAL REPAIR    . ANTERIOR AND POSTERIOR VAGINAL REPAIR W/ SACROSPINOUS LIGAMENT SUSPENSION Right   . BREAST BIOPSY  1963   Benign per pt's report  . COLONOSCOPY    . ESOPHAGOGASTRODUODENOSCOPY    . ESOPHAGOGASTRODUODENOSCOPY (EGD) WITH PROPOFOL N/A 11/10/2016   Procedure: ESOPHAGOGASTRODUODENOSCOPY (EGD) WITH PROPOFOL;  Surgeon: Lollie Sails, MD;  Location: [facility] ENDOSCOPY;  Service: Endoscopy;  Laterality: N/A;  . ESOPHAGOGASTRODUODENOSCOPY (EGD) WITH PROPOFOL N/A 09/09/2018   Procedure: ESOPHAGOGASTRODUODENOSCOPY (EGD) WITH PROPOFOL;  Surgeon: Lollie Sails, MD;  Location: [facility] ENDOSCOPY;  Service: Endoscopy;  Laterality: N/A;  . LUNG BIOPSY  2007   Benign per pt's report  . LUNG BIOPSY Right   . OOPHORECTOMY  1970  . PERINEOPLASTY  12/17/2013  . sling for stress incontinence  12/18/2011   Family History  Problem Relation Age of Onset  . Arthritis Mother   . Liver disease Mother   . Cirrhosis Mother   . Liver cancer Mother   . Heart disease Father   . Heart attack Father   . Coronary artery disease Father   . Depression Brother   . Breast cancer  Other   . Breast cancer Cousin        maternal cousins  . Liver cancer Cousin   . Diabetes Maternal Aunt    Social History   Socioeconomic History  . Marital status: Married    Spouse name: Not on file  . Number of children: 2  . Years of education: Not on file  . Highest education level: Not on file  Occupational History  . Occupation: Retired  Scientific laboratory technician  . Financial resource strain: Not hard at all  . Food insecurity    Worry: Never true    Inability: Never true  . Transportation needs    Medical: No    Non-medical: No  Tobacco Use  . Smoking status: Never Smoker  . Smokeless tobacco: Never  Used  Substance and Sexual Activity  . Alcohol use: No  . Drug use: No  . Sexual activity: Yes  Lifestyle  . Physical activity    Days per week: 0 days    Minutes per session: 0 min  . Stress: Not at all  Relationships  . Social Herbalist on phone: Three times a week    Gets together: Once a week    Attends religious service: More than 4 times per year    Active member of club or organization: Yes    Attends meetings of clubs or organizations: More than 4 times per year    Relationship status: Married  Other Topics Concern  . Not on file  Social History Narrative   Lives in [place] with husband. Has 2 children boy and girl, 4 grandchildren. Retired Presenter, broadcasting.       Daily Caffeine Use:  NO   Regular Exercise -  Not at this time due to back pain, would like to resume soon    Outpatient Encounter Medications as of 11/22/2018  Medication Sig  . amLODipine (NORVASC) 2.5 MG tablet Take 1 tablet by mouth once daily  . aspirin 81 MG tablet Take 1 tablet (81 mg total) by mouth daily.  Marland Kitchen atorvastatin (LIPITOR) 20 MG tablet TAKE 1 TABLET BY MOUTH ONCE DAILY  . Calcium Carb-Cholecalciferol (CALCIUM CARBONATE-VITAMIN D3 PO) Take by mouth daily.  . CHOLECALCIFEROL PO Take 1,000 Units by mouth daily.  Marland Kitchen CRANBERRY-CALCIUM PO Take 1 tablet by mouth.  Mariane Baumgarten Sodium (COLACE PO) Take 2 capsules by mouth daily.  Marland Kitchen estradiol (ESTRACE) 0.1 MG/GM vaginal cream Insert pea size amount nightly x 2 weeks then every other night x 2 weeks then 2-3 times weekly for maintenance  . EUTHYROX 75 MCG tablet Take 1 tablet by mouth once daily with breakfast  . fexofenadine-pseudoephedrine (ALLEGRA-D 24) 180-240 MG 24 hr tablet Take 1 tablet by mouth daily as needed.  . gabapentin (NEURONTIN) 100 MG capsule Take 100 mg by mouth 3 (three) times daily.  Marland Kitchen glucosamine-chondroitin 500-400 MG tablet Take 1 tablet by mouth 3 (three) times daily.  Marland Kitchen ipratropium (ATROVENT) 0.03 % nasal spray Place 2 sprays  into both nostrils every 12 (twelve) hours.  Marland Kitchen losartan (COZAAR) 50 MG tablet Take 1 tablet by mouth once daily  . metoCLOPramide (REGLAN) 5 MG tablet Take 5 mg by mouth 2 (two) times daily.  . Multiple Vitamin (MULTIVITAMIN) tablet Take 1 tablet by mouth daily.  . Omega-3 Fatty Acids (FISH OIL) 1000 MG CAPS Take 1,000 mg by mouth daily.  . pantoprazole (PROTONIX) 40 MG tablet Take 40 mg by mouth daily.  . quinapril (ACCUPRIL) 20 MG tablet  Take 20 mg by mouth daily.  . traMADol (ULTRAM) 50 MG tablet Take by mouth 2 (two) times daily as needed.   No facility-administered encounter medications on file as of 11/22/2018.     Activities of Daily Living In your present state of health, do you have any difficulty performing the following activities: 11/22/2018  Hearing? N  Vision? N  Difficulty concentrating or making decisions? N  Walking or climbing stairs? N  Dressing or bathing? N  Doing errands, shopping? N  Preparing Food and eating ? N  Using the Toilet? N  In the past six months, have you accidently leaked urine? N  Do you have problems with loss of bowel control? N  Managing your Medications? N  Managing your Finances? N  Housekeeping or managing your Housekeeping? N  Some recent data might be hidden    Patient Care Team: Einar Pheasant, MD as PCP - General (Internal Medicine) Anthonette Legato, MD (Internal Medicine)    Assessment:   This is a routine wellness examination for [place].  I connected with patient 11/22/18 at  8:30 AM EDT by a video/audio enabled telemedicine application and verified that I am speaking with the correct person using two identifiers. Patient stated full name and DOB. Patient gave permission to continue with virtual visit. Patient's location was at home and Nurse's location was at [place] office.   Health Maintenance Due: Influenza vaccine 2020- discussed; to be completed in season with doctor or local pharmacy.   Update all pending maintenance due as  appropriate.   See completed HM at the end of note.   Eye: Visual acuity not assessed. Virtual visit. Wears readers. Followed by their ophthalmologist every 12 months. Cataracts extracted.   Dental: Visits every 6 months.    Hearing: Demonstrates normal hearing during visit.  Safety:  Patient feels safe at home- yes Patient does have smoke detectors at home- yes Patient does wear sunscreen or protective clothing when in direct sunlight - yes Patient does wear seat belt when in a moving vehicle - yes Patient drives- yes Adequate lighting in walkways free from debris- yes Grab bars and handrails used as appropriate- yes Ambulates with no assistive device Cell phone or lifeline/life alert/medic alert on person when ambulating outside of the home- yes  Social: Alcohol intake - no  Smoking history- never   Smokers in home? None Illicit drug use? None  Depression: PHQ 2 &9 complete. See screening below. Denies irritability, anhedonia, sadness/tearfullness.  Stable.   Falls: See screening below.    Medication: Taking as directed and without issues.   Covid-19: Precautions and sickness symptoms discussed. Wears mask, social distancing, hand hygiene as appropriate.   Activities of Daily Living Patient denies needing assistance with: household chores, feeding themselves, getting from bed to chair, getting to the toilet, bathing/showering, dressing, managing money, or preparing meals.   Memory: Patient is alert. Patient denies difficulty focusing or concentrating. Correctly identified the president of the [place], season and recall. Patient likes to read and play games on her tablet for brain stimulation.  BMI- discussed the importance of a healthy diet, water intake and the benefits of aerobic exercise.  Educational material provided.  Physical activity- none. Encouraged to walk for exercise.   Diet:  Low carb Water: good intake Caffeine: no Ensure/Protein supplement: no   Advanced Directive: End of life planning; Advance aging; Advanced directives discussed.  Copy of current HCPOA/Living Will on file.  Other Providers Patient Care Team: Einar Pheasant, MD as PCP -  General (Internal Medicine) Anthonette Legato, MD (Internal Medicine)  Exercise Activities and Dietary recommendations Current Exercise Habits: The patient does not participate in regular exercise at present  Goals      Patient Stated   . Increase physical activity (pt-stated)     Walk for exercise        Fall Risk Fall Risk  11/22/2018 09/19/2018 09/17/2017 02/10/2017 02/04/2016  Falls in the past year? 0 0 No No No  Number falls in past yr: - 0 - - -  Injury with Fall? - 0 - - -  Comment - - - - -  Risk for fall due to : - - - - -  Risk for fall due to: Comment - - - - -  Follow up - - - - -   Timed Get Up and Go performed: no, virtual visit  Depression Screen PHQ 2/9 Scores 11/22/2018 09/17/2017 06/03/2017 02/10/2017  PHQ - 2 Score 0 0 0 0  PHQ- 9 Score - - 4 -     Cognitive Function MMSE - Mini Mental State Exam 09/24/2014 09/24/2014  Orientation to time 5 5  Orientation to Place 5 -  Registration 3 3  Attention/ Calculation 5 -  Recall 3 3  Language- name 2 objects 2 -  Language- repeat 1 1  Language- follow 3 step command 3 -  Language- read & follow direction 1 1  Write a sentence 1 -  Copy design 1 1  Total score 30 -     6CIT Screen 11/22/2018 09/17/2017  What Year? 0 points 0 points  What month? 0 points 0 points  What time? 0 points 0 points  Count back from 20 0 points 0 points  Months in reverse 0 points 0 points  Repeat phrase 0 points 0 points  Total Score 0 0    Immunization History  Administered Date(s) Administered  . Hepatitis A 07/14/2017, 08/17/2017  . Hepatitis B 07/14/2017, 08/17/2017  . Influenza Split 12/15/2010, 03/07/2012  . Influenza, High Dose Seasonal PF 12/21/2016, 11/17/2017  . Influenza,inj,Quad PF,6+ Mos 12/05/2013  .  Influenza-Unspecified 12/25/2014, 01/03/2016, 12/28/2016  . Pneumococcal Conjugate-13 03/12/2014  . Pneumococcal Polysaccharide-23 12/09/2005, 03/03/2013  . Tdap 03/07/2012  . Zoster 12/09/2009   Screening Tests Health Maintenance  Topic Date Due  . INFLUENZA VACCINE  06/14/2019 (Originally 10/15/2018)  . MAMMOGRAM  11/03/2019  . TETANUS/TDAP  03/07/2022  . DEXA SCAN  Completed  . PNA vac Low Risk Adult  Completed      Plan:   Keep all routine maintenance appointments.   Follow up today with pcp  Medicare Attestation I have personally reviewed: The patient's medical and social history Their use of alcohol, tobacco or illicit drugs Their current medications and supplements The patient's functional ability including ADLs,fall risks, home safety risks, cognitive, and hearing and visual impairment Diet and physical activities Evidence for depression       OBrien-Blaney, Denisa L, LPN  06/22/7024   Reviewed above information.  Agree with assessment and plan.    Dr Nicki Reaper

## 2018-11-22 NOTE — Progress Notes (Addendum)
Patient ID: Caitlin Lin, female   DOB: 04/06/1940, 78 y.o.   MRN: [id number]   Virtual Visit via video Note  This visit type was conducted due to national recommendations for restrictions regarding the COVID-19 pandemic (e.g. social distancing).  This format is felt to be most appropriate for this patient at this time.  All issues noted in this document were discussed and addressed.  No physical exam was performed (except for noted visual exam findings with Video Visits).   I connected with Caitlin Lin by a video enabled telemedicine application or telephone and verified that I am speaking with the correct person using two identifiers. Location patient: home Location provider: work Persons participating in the virtual visit: patient, provider  I discussed the limitations, risks, security and privacy concerns of performing an evaluation and management service by video and the availability of in person appointments. The patient expressed understanding and agreed to proceed.   Reason for visit: scheduled follow up.    HPI: She has recently been evaluated by GI for f/u of bowel wall thickening/mass versus distension in the stomach antrum/pylorus.  Is s/p EGD, US, HIDA - per GI - unremarkable.  On protnix and reglan.  Denies acid reflux and denies epigastric pain now.  Still has some intermittent "sensation change" under her right breast - RUQ.  Only occurs at night.  No known trigger.  Denies pain.  Has had extensive testing.  Declines further testing.  Will monitor.  Some issues with constipation.  Taking miralax.  Improved.  Eating well.  Good appetite.  No nausea or vomiting.  No chest pain.  No sob.  Is not sleeping as well.  States she will go to sleep around 11:00 and will sleep until 3:00.  Wakes up and stays up for a while and then back to sleep early am.  Taking melatonin.  This does not occur every night.  No known triggers.  Has recently had headache and head fullness.  Some  allergy/sinus symptoms.  Taking allegra.  Using saline nasal spray.  Helping.  No significant headache.  Some discomfort right hip/leg at times.  Does not limit her activity.  Does report some numbness in her toes and "pads of her feet" - bilaterally.  Saw Dr Oaks 09/19/18.  Recommended f/u chest CT in 3 months.  Scheduled for 12/19/18.  Also saw gyn 08/2018.  Recommended vaginal estrogen.  Previously evaluated by cardiology - found to have aortic regurgitation.  Last checked - 2018.  Recommended f/u echo in two years.  Due.      ROS: See pertinent positives and negatives per HPI.  Past Medical History:  Diagnosis Date  . Cataracts, bilateral   . Chronic kidney disease    Followed by Dr. Lateef  . Chronic kidney disease   . Colon polyp   . Constipation due to slow transit   . Diarrhea   . Diarrhea   . GERD (gastroesophageal reflux disease)   . History of cyst of breast   . Hyperlipidemia   . Hypertension   . Hypothyroidism   . Melanoma of lower leg ([facility]) 2013  . Shingles 2013  . Shingles   . Spinal stenosis in cervical region    Dr. Chasnis  . Subdural hematoma ([facility])   . Thyroid disease     Past Surgical History:  Procedure Laterality Date  . ABDOMINAL HYSTERECTOMY  1974  . ANTERIOR AND POSTERIOR VAGINAL REPAIR    . ANTERIOR AND POSTERIOR VAGINAL REPAIR W/ SACROSPINOUS   LIGAMENT SUSPENSION Right   . BREAST BIOPSY  1963   Benign per pt's report  . COLONOSCOPY    . ESOPHAGOGASTRODUODENOSCOPY    . ESOPHAGOGASTRODUODENOSCOPY (EGD) WITH PROPOFOL N/A 11/10/2016   Procedure: ESOPHAGOGASTRODUODENOSCOPY (EGD) WITH PROPOFOL;  Surgeon: Lollie Sails, MD;  [facility] ENDOSCOPY;  Service: Endoscopy;  Laterality: N/A;  . ESOPHAGOGASTRODUODENOSCOPY (EGD) WITH PROPOFOL N/A 09/09/2018   Procedure: ESOPHAGOGASTRODUODENOSCOPY (EGD) WITH PROPOFOL;  Surgeon: Lollie Sails, MD;  Location: [facility] ENDOSCOPY;  Service: Endoscopy;  Laterality: N/A;  . LUNG BIOPSY  2007   Benign per pt's report   . LUNG BIOPSY Right   . OOPHORECTOMY  1970  . PERINEOPLASTY  12/17/2013  . sling for stress incontinence  12/18/2011    Family History  Problem Relation Age of Onset  . Arthritis Mother   . Liver disease Mother   . Cirrhosis Mother   . Liver cancer Mother   . Heart disease Father   . Heart attack Father   . Coronary artery disease Father   . Depression Brother   . Breast cancer Other   . Breast cancer Cousin        maternal cousins  . Liver cancer Cousin   . Diabetes Maternal Aunt     SOCIAL HX: reviewed.    Current Outpatient Medications:  .  amLODipine (NORVASC) 2.5 MG tablet, Take 1 tablet by mouth once daily, Disp: 90 tablet, Rfl: 0 .  aspirin 81 MG tablet, Take 1 tablet (81 mg total) by mouth daily., Disp: 30 tablet, Rfl: 3 .  atorvastatin (LIPITOR) 20 MG tablet, TAKE 1 TABLET BY MOUTH ONCE DAILY, Disp: 90 tablet, Rfl: 3 .  Calcium Carb-Cholecalciferol (CALCIUM CARBONATE-VITAMIN D3 PO), Take by mouth daily., Disp: , Rfl:  .  CHOLECALCIFEROL PO, Take 1,000 Units by mouth daily., Disp: , Rfl:  .  CRANBERRY-CALCIUM PO, Take 1 tablet by mouth., Disp: , Rfl:  .  Docusate Sodium (COLACE PO), Take 2 capsules by mouth daily., Disp: , Rfl:  .  estradiol (ESTRACE) 0.1 MG/GM vaginal cream, Insert pea size amount nightly x 2 weeks then every other night x 2 weeks then 2-3 times weekly for maintenance, Disp: , Rfl:  .  EUTHYROX 75 MCG tablet, Take 1 tablet by mouth once daily with breakfast, Disp: 90 tablet, Rfl: 0 .  fexofenadine-pseudoephedrine (ALLEGRA-D 24) 180-240 MG 24 hr tablet, Take 1 tablet by mouth daily as needed., Disp: 90 tablet, Rfl: 3 .  gabapentin (NEURONTIN) 100 MG capsule, Take 100 mg by mouth 3 (three) times daily., Disp: , Rfl:  .  glucosamine-chondroitin 500-400 MG tablet, Take 1 tablet by mouth 3 (three) times daily., Disp: 90 tablet, Rfl: 3 .  ipratropium (ATROVENT) 0.03 % nasal spray, Place 2 sprays into both nostrils every 12 (twelve) hours., Disp: , Rfl:  .   losartan (COZAAR) 50 MG tablet, Take 1 tablet by mouth once daily, Disp: 90 tablet, Rfl: 0 .  metoCLOPramide (REGLAN) 5 MG tablet, Take 5 mg by mouth 2 (two) times daily., Disp: , Rfl:  .  Multiple Vitamin (MULTIVITAMIN) tablet, Take 1 tablet by mouth daily., Disp: , Rfl:  .  Omega-3 Fatty Acids (FISH OIL) 1000 MG CAPS, Take 1,000 mg by mouth daily., Disp: , Rfl:  .  pantoprazole (PROTONIX) 40 MG tablet, Take 40 mg by mouth daily., Disp: , Rfl:  .  quinapril (ACCUPRIL) 20 MG tablet, Take 20 mg by mouth daily., Disp: , Rfl:  .  traMADol (ULTRAM) 50 MG tablet, Take  by mouth 2 (two) times daily as needed., Disp: , Rfl:   EXAM:  GENERAL: alert, oriented, appears well and in no acute distress  HEENT: atraumatic, conjunttiva clear, no obvious abnormalities on inspection of external nose and ears  NECK: normal movements of the head and neck  LUNGS: on inspection no signs of respiratory distress, breathing rate appears normal, no obvious gross SOB, gasping or wheezing  CV: no obvious cyanosis  PSYCH/NEURO: pleasant and cooperative, no obvious depression or anxiety, speech and thought processing grossly intact  ASSESSMENT AND PLAN:  Discussed the following assessment and plan:  Anemia, iron deficiency Follow cbc.   Aortic atherosclerosis (HCC) On lipitor.   Aortic regurgitation Saw cardiology (Dr Ingal) 06/2016.  Recommended f/u ECHO in 2 years.  Due f/u.  Schedule f/u with cardiology.    CKD (chronic kidney disease) stage 4, GFR 15-29 ml/min (HCC) Has been followed by nephrology.  Avoid antiinflammatories.  Follow metabolic panel.   Fatty liver Has seen GI.  Follow liver function tests.    GERD (gastroesophageal reflux disease) Controlled on protonix.    Hypercholesterolemia On lipitor.  Follow lipid panel and liver function tests.    Hyperglycemia Low carb diet and exercise.  Follow met b and a1c.   Hypertension Blood pressure has been under reasonable control.  Continue  current medication.  Follow pressures.  Follow metabolic panel.      Abdominal pain Epigastric pain improved/resolved as outlined.  On protonix and reglan.  W/up as outlined.  Still with RUQ sensation as outlined.  Continues f/u with GI.  Wants to follow.    Sleep difficulties Sleep issues as outlined.  On melatonin.  Continue. Desires no further intervention.  Treat nasal congestion - saline nasal spray and allegra.  Follow.    Numbness of toes Numbness of toes as outlined.  Check b12 with next labs.      I discussed the assessment and treatment plan with the patient. The patient was provided an opportunity to ask questions and all were answered. The patient agreed with the plan and demonstrated an understanding of the instructions.   The patient was advised to call back or seek an in-person evaluation if the symptoms worsen or if the condition fails to improve as anticipated.   Charlene Scott, MD  

## 2018-11-22 NOTE — Patient Instructions (Addendum)
  Ms. Hamric , Thank you for taking time to come for your Medicare Wellness Visit. I appreciate your ongoing commitment to your health goals. Please review the following plan we discussed and let me know if I can assist you in the future.   These are the goals we discussed: Goals      Patient Stated   . Increase physical activity (pt-stated)     Walk for exercise        This is a list of the screening recommended for you and due dates:  Health Maintenance  Topic Date Due  . Flu Shot  10/15/2018  . Mammogram  11/03/2019  . Tetanus Vaccine  03/07/2022  . DEXA scan (bone density measurement)  Completed  . Pneumonia vaccines  Completed

## 2018-11-23 ENCOUNTER — Encounter: Payer: Self-pay | Admitting: Internal Medicine

## 2018-11-23 DIAGNOSIS — G479 Sleep disorder, unspecified: Secondary | ICD-10-CM | POA: Insufficient documentation

## 2018-11-23 DIAGNOSIS — R200 Anesthesia of skin: Secondary | ICD-10-CM | POA: Insufficient documentation

## 2018-11-23 NOTE — Assessment & Plan Note (Signed)
Follow cbc.  

## 2018-11-23 NOTE — Assessment & Plan Note (Signed)
Epigastric pain improved/resolved as outlined.  On protonix and reglan.  W/up as outlined.  Still with RUQ sensation as outlined.  Continues f/u with GI.  Wants to follow.

## 2018-11-23 NOTE — Assessment & Plan Note (Signed)
Has seen GI.  Follow liver function tests.

## 2018-11-23 NOTE — Assessment & Plan Note (Signed)
Has been followed by nephrology.  Avoid antiinflammatories.  Follow metabolic panel.

## 2018-11-23 NOTE — Assessment & Plan Note (Signed)
Saw cardiology (Dr Yvone Neu) 06/2016.  Recommended f/u ECHO in 2 years.  Due f/u.  Schedule f/u with cardiology.

## 2018-11-23 NOTE — Assessment & Plan Note (Signed)
Numbness of toes as outlined.  Check b12 with next labs.

## 2018-11-23 NOTE — Assessment & Plan Note (Signed)
Low carb diet and exercise.  Follow met b and a1c.  

## 2018-11-23 NOTE — Assessment & Plan Note (Signed)
On lipitor

## 2018-11-23 NOTE — Assessment & Plan Note (Signed)
On lipitor.  Follow lipid panel and liver function tests.   

## 2018-11-23 NOTE — Assessment & Plan Note (Signed)
Blood pressure has been under reasonable control.  Continue current medication.  Follow pressures.  Follow metabolic panel.

## 2018-11-23 NOTE — Assessment & Plan Note (Signed)
Controlled on protonix.   

## 2018-11-23 NOTE — Assessment & Plan Note (Signed)
Sleep issues as outlined.  On melatonin.  Continue. Desires no further intervention.  Treat nasal congestion - saline nasal spray and allegra.  Follow.

## 2018-11-28 NOTE — Addendum Note (Signed)
Addended by: Alisa Graff on: 11/28/2018 03:28 PM   Modules accepted: Orders

## 2018-12-01 ENCOUNTER — Encounter: Payer: Self-pay | Admitting: Cardiology

## 2018-12-01 ENCOUNTER — Other Ambulatory Visit: Payer: Self-pay

## 2018-12-01 ENCOUNTER — Ambulatory Visit (INDEPENDENT_AMBULATORY_CARE_PROVIDER_SITE_OTHER): Payer: PPO | Admitting: Cardiology

## 2018-12-01 VITALS — BP 140/80 | HR 73 | Temp 97.6°F | Ht 64.0 in | Wt 165.2 lb

## 2018-12-01 DIAGNOSIS — I351 Nonrheumatic aortic (valve) insufficiency: Secondary | ICD-10-CM

## 2018-12-01 DIAGNOSIS — I10 Essential (primary) hypertension: Secondary | ICD-10-CM | POA: Diagnosis not present

## 2018-12-01 DIAGNOSIS — I8393 Asymptomatic varicose veins of bilateral lower extremities: Secondary | ICD-10-CM | POA: Diagnosis not present

## 2018-12-01 NOTE — Progress Notes (Signed)
Cardiology Office Note:    Date:  12/01/2018   ID:  Caitlin Lin, DOB [birth year], MRN [id number]  PCP:  Einar Pheasant, MD  Cardiologist:  Kate Sable, MD  Electrophysiologist:  None   Referring MD: Einar Pheasant, MD   Chief Complaint  Patient presents with   New Patient (Initial Visit)    Aortic valve insufficiency    History of Present Illness:    Caitlin Lin is a 78 y.o. female with a hx of hypertension, hyperlipidemia, aortic regurgitation, mitral regurgitation.  She was originally seen about 2 years ago by Dr. Yvone Neu.  At that time an echocardiogram did reveal mild aortic regurgitation, and mild mitral regurgitation.  Ejection fraction was normal.  She has since been doing okay, denies chest pain, shortness of breath, nausea, vomiting.  She states having lower extremity edema which gets worse throughout the day, most likely due to bad veins.  She usually raises her leg when sitting and this helps.  Past Medical History:  Diagnosis Date   Cataracts, bilateral    Chronic kidney disease    Followed by Dr. Holley Raring   Chronic kidney disease    Colon polyp    Constipation due to slow transit    Diarrhea    Diarrhea    GERD (gastroesophageal reflux disease)    History of cyst of breast    Hyperlipidemia    Hypertension    Hypothyroidism    Melanoma of lower leg ([place]) 2013   Shingles 2013   Shingles    Spinal stenosis in cervical region    Dr. Sharlet Salina   Subdural hematoma [facility])    Thyroid disease     Past Surgical History:  Procedure Laterality Date   ABDOMINAL HYSTERECTOMY  1974   ANTERIOR AND POSTERIOR VAGINAL REPAIR     ANTERIOR AND POSTERIOR VAGINAL REPAIR W/ SACROSPINOUS LIGAMENT SUSPENSION Right    BREAST BIOPSY  1963   Benign per pt's report   COLONOSCOPY     ESOPHAGOGASTRODUODENOSCOPY     ESOPHAGOGASTRODUODENOSCOPY (EGD) WITH PROPOFOL N/A 11/10/2016   Procedure: ESOPHAGOGASTRODUODENOSCOPY (EGD) WITH PROPOFOL;  Surgeon:  Lollie Sails, MD;  Location: [facility] ENDOSCOPY;  Service: Endoscopy;  Laterality: N/A;   ESOPHAGOGASTRODUODENOSCOPY (EGD) WITH PROPOFOL N/A 09/09/2018   Procedure: ESOPHAGOGASTRODUODENOSCOPY (EGD) WITH PROPOFOL;  Surgeon: Lollie Sails, MD;  Location: [facility] ENDOSCOPY;  Service: Endoscopy;  Laterality: N/A;   LUNG BIOPSY  2007   Benign per pt's report   LUNG BIOPSY Right    OOPHORECTOMY  1970   PERINEOPLASTY  12/17/2013   sling for stress incontinence  12/18/2011    Current Medications: Current Meds  Medication Sig   amLODipine (NORVASC) 2.5 MG tablet Take 1 tablet by mouth once daily   aspirin 81 MG tablet Take 1 tablet (81 mg total) by mouth daily.   atorvastatin (LIPITOR) 20 MG tablet TAKE 1 TABLET BY MOUTH ONCE DAILY   Calcium Carb-Cholecalciferol (CALCIUM CARBONATE-VITAMIN D3 PO) Take by mouth daily.   CHOLECALCIFEROL PO Take 1,000 Units by mouth daily.   CRANBERRY-CALCIUM PO Take 1 tablet by mouth.   Docusate Sodium (COLACE PO) Take 2 capsules by mouth daily.   EUTHYROX 75 MCG tablet Take 1 tablet by mouth once daily with breakfast   fexofenadine-pseudoephedrine (ALLEGRA-D 24) 180-240 MG 24 hr tablet Take 1 tablet by mouth daily as needed.   glucosamine-chondroitin 500-400 MG tablet Take 1 tablet by mouth 3 (three) times daily.   ipratropium (ATROVENT) 0.03 % nasal spray Place 2 sprays into  both nostrils every 12 (twelve) hours.   losartan (COZAAR) 50 MG tablet Take 1 tablet by mouth once daily   metoCLOPramide (REGLAN) 5 MG tablet Take 5 mg by mouth 2 (two) times daily.   Multiple Vitamin (MULTIVITAMIN) tablet Take 1 tablet by mouth daily.   Omega-3 Fatty Acids (FISH OIL) 1000 MG CAPS Take 1,000 mg by mouth daily.   pantoprazole (PROTONIX) 40 MG tablet Take 40 mg by mouth daily.   quinapril (ACCUPRIL) 20 MG tablet Take 20 mg by mouth daily.     Allergies:   Quinapril and Codeine   Social History   Socioeconomic History   Marital status:  Married    Spouse name: Not on file   Number of children: 2   Years of education: Not on file   Highest education level: Not on file  Occupational History   Occupation: Retired  Scientist, product/process development strain: Not hard at [employer]    Worry: Never true    Inability: Never true   Transportation needs    Medical: No    Non-medical: No  Tobacco Use   Smoking status: Never Smoker   Smokeless tobacco: Never Used  Substance and Sexual Activity   Alcohol use: No   Drug use: No   Sexual activity: Yes  Lifestyle   Physical activity    Days per week: 0 days    Minutes per session: 0 min   Stress: Not at all  Relationships   Social connections    Talks on phone: Three times a week    Gets together: Once a week    Attends religious service: More than 4 times per year    Active member of club or organization: Yes    Attends meetings of clubs or organizations: More than 4 times per year    Relationship status: Married  Other Topics Concern   Not on file  Social History Narrative   Lives in [place] with husband. Has 2 children boy and girl, 4 grandchildren. Retired Presenter, broadcasting.       Daily Caffeine Use:  NO   Regular Exercise -  Not at this time due to back pain, would like to resume soon     Family History: The patient's family history includes Arthritis in her mother; Breast cancer in her cousin and another family member; Cirrhosis in her mother; Coronary artery disease in her father; Depression in her brother; Diabetes in her maternal aunt; Heart attack in her father; Heart disease in her father; Liver cancer in her cousin and mother; Liver disease in her mother.  ROS:   Please see the history of present illness.     All other systems reviewed and are negative.  EKGs/Labs/Other Studies Reviewed:    The following studies were reviewed today:   EKG:  EKG is  ordered today.  The ekg ordered today demonstrates normal sinus rhythm, low  voltage in precordial leads, occasional PACs.  Echo 06/16/2016 Study Conclusions  - Left ventricle: The cavity size was normal. Systolic function was   normal. The estimated ejection fraction was in the range of 60%   to 65%. Wall motion was normal; there were no regional wall   motion abnormalities. Doppler parameters are consistent with   abnormal left ventricular relaxation (grade 1 diastolic   dysfunction). - Aortic valve: There was mild regurgitation. - Mitral valve: Calcified annulus. There was mild regurgitation. - Left atrium: The atrium was normal in size. -  Right ventricle: Systolic function was normal. - Pulmonary arteries: Systolic pressure was within the normal   range.  Recent Labs: 07/22/2018: ALT 19; BUN 26; Creatinine, Ser 1.72; Hemoglobin 12.8; Platelets 200.0; Sodium 139; TSH 3.55 08/01/2018: Potassium 4.7  Recent Lipid Panel    Component Value Date/Time   CHOL 174 07/22/2018 0839   TRIG 225.0 (H) 07/22/2018 0839   HDL 39.50 07/22/2018 0839   CHOLHDL 4 07/22/2018 0839   VLDL 45.0 (H) 07/22/2018 0839   LDLCALC 90 03/23/2018 0856   LDLDIRECT 84.0 07/22/2018 0839    Physical Exam:    VS:  BP 140/80 (BP Location: Right Arm, Patient Position: Sitting, Cuff Size: Normal)    Pulse 73    Temp 97.6 F (36.4 C)    Ht 5\' 4"  (1.626 m)    Wt 165 lb 4 oz (75 kg)    SpO2 95%    BMI 28.37 kg/m     Wt Readings from Last 3 Encounters:  12/01/18 165 lb 4 oz (75 kg)  11/23/18 163 lb (73.9 kg)  09/19/18 163 lb (73.9 kg)     GEN:  Well nourished, well developed in no acute distress HEENT: Normal NECK: No JVD; No carotid bruits LYMPHATICS: No lymphadenopathy CARDIAC: RRR, 1/6 systolic murmur right upper sternal border.  No rubs, gallops RESPIRATORY:  Clear to auscultation without rales, wheezing or rhonchi  ABDOMEN: Soft, non-tender, non-distended MUSCULOSKELETAL:  No edema; varicose veins noted in lower extremity. SKIN: Warm and dry NEUROLOGIC:  Alert and oriented x  3 PSYCHIATRIC:  Normal affect   ASSESSMENT:   Faint systolic murmur right upper sternal border, possibly aortic valve sclerosis. 1. Aortic valve insufficiency, etiology of cardiac valve disease unspecified   2. Asymptomatic varicose veins of both lower extremities   3. Essential hypertension    PLAN:    In order of problems listed above:  1. We will get echocardiogram, to monitor valve disease. 2. Patient advised to continue keeping her leg raise when watching TV or reading.  If symptoms persist she can use compression stockings. 3. Blood pressure reasonably controlled.  Continue current blood pressure meds.  Follow-up after echocardiogram.   Medication Adjustments/Labs and Tests Ordered: Current medicines are reviewed at length with the patient today.  Concerns regarding medicines are outlined above.  Orders Placed This Encounter  Procedures   EKG 12-Lead   ECHOCARDIOGRAM COMPLETE   No orders of the defined types were placed in this encounter.   Patient Instructions  Medication Instructions:  - Your physician recommends that you continue on your current medications as directed. Please refer to the Current Medication list given to you today.  If you need a refill on your cardiac medications before your next appointment, please call your pharmacy.   Lab work: - none ordered  If you have labs (blood work) drawn today and your tests are completely normal, you will receive your results only by:  Echo (if you have MyChart) OR  A paper copy in the mail If you have any lab test that is abnormal or we need to change your treatment, we will call you to review the results.  Testing/Procedures: - Your physician has requested that you have an echocardiogram. Echocardiography is a painless test that uses sound waves to create images of your heart. It provides your doctor with information about the size and shape of your heart and how well your hearts chambers and  valves are working. This procedure takes approximately one hour. There are no  restrictions for this procedure.   Follow-Up: At [facility], you and your health needs are our priority.  As part of our continuing mission to provide you with exceptional heart care, we have created designated Provider Care Teams.  These Care Teams include your primary Cardiologist (physician) and Advanced Practice Providers (APPs -  Physician Assistants and Nurse Practitioners) who all work together to provide you with the care you need, when you need it.  in 4-6 weeks (after your echo is completed)   Any Other Special Instructions Will Be Listed Below (If Applicable). - N/A   Echocardiogram An echocardiogram is a procedure that uses painless sound waves (ultrasound) to produce an image of the heart. Images from an echocardiogram can provide important information about:  Signs of coronary artery disease (CAD).  Aneurysm detection. An aneurysm is a weak or damaged part of an artery wall that bulges out from the normal force of blood pumping through the body.  Heart size and shape. Changes in the size or shape of the heart can be associated with certain conditions, including heart failure, aneurysm, and CAD.  Heart muscle function.  Heart valve function.  Signs of a past heart attack.  Fluid buildup around the heart.  Thickening of the heart muscle.  A tumor or infectious growth around the heart valves. Tell a health care provider about:  Any allergies you have.  All medicines you are taking, including vitamins, herbs, eye drops, creams, and over-the-counter medicines.  Any blood disorders you have.  Any surgeries you have had.  Any medical conditions you have.  Whether you are pregnant or may be pregnant. What are the risks? Generally, this is a safe procedure. However, problems may occur, including:  Allergic reaction to dye (contrast) that may be used during the procedure. What  happens before the procedure? No specific preparation is needed. You may eat and drink normally. What happens during the procedure?   An IV tube may be inserted into one of your veins.  You may receive contrast through this tube. A contrast is an injection that improves the quality of the pictures from your heart.  A gel will be applied to your chest.  A wand-like tool (transducer) will be moved over your chest. The gel will help to transmit the sound waves from the transducer.  The sound waves will harmlessly bounce off of your heart to allow the heart images to be captured in real-time motion. The images will be recorded on a computer. The procedure may vary among health care providers and hospitals. What happens after the procedure?  You may return to your normal, everyday life, including diet, activities, and medicines, unless your health care provider tells you not to do that. Summary  An echocardiogram is a procedure that uses painless sound waves (ultrasound) to produce an image of the heart.  Images from an echocardiogram can provide important information about the size and shape of your heart, heart muscle function, heart valve function, and fluid buildup around your heart.  You do not need to do anything to prepare before this procedure. You may eat and drink normally.  After the echocardiogram is completed, you may return to your normal, everyday life, unless your health care provider tells you not to do that. This information is not intended to replace advice given to you by your health care provider. Make sure you discuss any questions you have with your health care provider. Document Released: 02/28/2000 Document Revised: 06/23/2018 Document Reviewed: 04/04/2016 Elsevier  Patient Education  2020 [place], Kate Sable, MD  12/01/2018 10:07 AM    [place]

## 2018-12-01 NOTE — Patient Instructions (Addendum)
Medication Instructions:  - Your physician recommends that you continue on your current medications as directed. Please refer to the Current Medication list given to you today.  If you need a refill on your cardiac medications before your next appointment, please call your pharmacy.   Lab work: - none ordered  If you have labs (blood work) drawn today and your tests are completely normal, you will receive your results only by: Marland Kitchen MyChart Message (if you have MyChart) OR . A paper copy in the mail If you have any lab test that is abnormal or we need to change your treatment, we will call you to review the results.  Testing/Procedures: - Your physician has requested that you have an echocardiogram. Echocardiography is a painless test that uses sound waves to create images of your heart. It provides your doctor with information about the size and shape of your heart and how well your heart's chambers and valves are working. This procedure takes approximately one hour. There are no restrictions for this procedure.   Follow-Up: At [facility], you and your health needs are our priority.  As part of our continuing mission to provide you with exceptional heart care, we have created designated Provider Care Teams.  These Care Teams include your primary Cardiologist (physician) and Advanced Practice Providers (APPs -  Physician Assistants and Nurse Practitioners) who all work together to provide you with the care you need, when you need it. . in 4-6 weeks (after your echo is completed)   Any Other Special Instructions Will Be Listed Below (If Applicable). - N/A   Echocardiogram An echocardiogram is a procedure that uses painless sound waves (ultrasound) to produce an image of the heart. Images from an echocardiogram can provide important information about:  Signs of coronary artery disease (CAD).  Aneurysm detection. An aneurysm is a weak or damaged part of an artery wall that bulges out from  the normal force of blood pumping through the body.  Heart size and shape. Changes in the size or shape of the heart can be associated with certain conditions, including heart failure, aneurysm, and CAD.  Heart muscle function.  Heart valve function.  Signs of a past heart attack.  Fluid buildup around the heart.  Thickening of the heart muscle.  A tumor or infectious growth around the heart valves. Tell a health care provider about:  Any allergies you have.  All medicines you are taking, including vitamins, herbs, eye drops, creams, and over-the-counter medicines.  Any blood disorders you have.  Any surgeries you have had.  Any medical conditions you have.  Whether you are pregnant or may be pregnant. What are the risks? Generally, this is a safe procedure. However, problems may occur, including:  Allergic reaction to dye (contrast) that may be used during the procedure. What happens before the procedure? No specific preparation is needed. You may eat and drink normally. What happens during the procedure?   An IV tube may be inserted into one of your veins.  You may receive contrast through this tube. A contrast is an injection that improves the quality of the pictures from your heart.  A gel will be applied to your chest.  A wand-like tool (transducer) will be moved over your chest. The gel will help to transmit the sound waves from the transducer.  The sound waves will harmlessly bounce off of your heart to allow the heart images to be captured in real-time motion. The images will be recorded on a  computer. The procedure may vary among health care providers and hospitals. What happens after the procedure?  You may return to your normal, everyday life, including diet, activities, and medicines, unless your health care provider tells you not to do that. Summary  An echocardiogram is a procedure that uses painless sound waves (ultrasound) to produce an image of the  heart.  Images from an echocardiogram can provide important information about the size and shape of your heart, heart muscle function, heart valve function, and fluid buildup around your heart.  You do not need to do anything to prepare before this procedure. You may eat and drink normally.  After the echocardiogram is completed, you may return to your normal, everyday life, unless your health care provider tells you not to do that. This information is not intended to replace advice given to you by your health care provider. Make sure you discuss any questions you have with your health care provider. Document Released: 02/28/2000 Document Revised: 06/23/2018 Document Reviewed: 04/04/2016 Elsevier Patient Education  2020 [employer].

## 2018-12-07 ENCOUNTER — Other Ambulatory Visit: Payer: Self-pay

## 2018-12-07 ENCOUNTER — Other Ambulatory Visit (INDEPENDENT_AMBULATORY_CARE_PROVIDER_SITE_OTHER): Payer: PPO

## 2018-12-07 DIAGNOSIS — I10 Essential (primary) hypertension: Secondary | ICD-10-CM | POA: Diagnosis not present

## 2018-12-07 DIAGNOSIS — R200 Anesthesia of skin: Secondary | ICD-10-CM

## 2018-12-07 DIAGNOSIS — E7800 Pure hypercholesterolemia, unspecified: Secondary | ICD-10-CM | POA: Diagnosis not present

## 2018-12-07 DIAGNOSIS — R739 Hyperglycemia, unspecified: Secondary | ICD-10-CM | POA: Diagnosis not present

## 2018-12-07 LAB — LIPID PANEL
Cholesterol: 153 mg/dL (ref 0–200)
HDL: 38.8 mg/dL — ABNORMAL LOW (ref >39.00)
LDL Cholesterol: 74 mg/dL (ref 0–99)
NonHDL: 113.75
Total CHOL/HDL Ratio: 4
Triglycerides: 197 mg/dL — ABNORMAL HIGH (ref 0.0–149.0)
VLDL: 39.4 mg/dL (ref 0.0–40.0)

## 2018-12-07 LAB — CBC WITH DIFFERENTIAL/PLATELET
Basophils Absolute: 0 10*3/uL (ref 0.0–0.1)
Basophils Relative: 0.4 % (ref 0.0–3.0)
Eosinophils Absolute: 0.1 10*3/uL (ref 0.0–0.7)
Eosinophils Relative: 1.7 % (ref 0.0–5.0)
HCT: 38.3 % (ref 36.0–46.0)
Hemoglobin: 12.7 g/dL (ref 12.0–15.0)
Lymphocytes Relative: 50.1 % — ABNORMAL HIGH (ref 12.0–46.0)
Lymphs Abs: 2.4 10*3/uL (ref 0.7–4.0)
MCHC: 33.2 g/dL (ref 30.0–36.0)
MCV: 87.8 fl (ref 78.0–100.0)
Monocytes Absolute: 0.7 10*3/uL (ref 0.1–1.0)
Monocytes Relative: 15.3 % — ABNORMAL HIGH (ref 3.0–12.0)
Neutro Abs: 1.6 10*3/uL (ref 1.4–7.7)
Neutrophils Relative %: 32.5 % — ABNORMAL LOW (ref 43.0–77.0)
Platelets: 192 10*3/uL (ref 150.0–400.0)
RBC: 4.37 Mil/uL (ref 3.87–5.11)
RDW: 14 % (ref 11.5–15.5)
WBC: 4.9 10*3/uL (ref 4.0–10.5)

## 2018-12-07 LAB — HEPATIC FUNCTION PANEL
ALT: 15 U/L (ref 0–35)
AST: 23 U/L (ref 0–37)
Albumin: 4 g/dL (ref 3.5–5.2)
Alkaline Phosphatase: 115 U/L (ref 39–117)
Bilirubin, Direct: 0.1 mg/dL (ref 0.0–0.3)
Total Bilirubin: 0.6 mg/dL (ref 0.2–1.2)
Total Protein: 6.9 g/dL (ref 6.0–8.3)

## 2018-12-07 LAB — HEMOGLOBIN A1C: Hgb A1c MFr Bld: 5.8 % (ref 4.6–6.5)

## 2018-12-07 LAB — BASIC METABOLIC PANEL
BUN: 29 mg/dL — ABNORMAL HIGH (ref 6–23)
CO2: 25 mEq/L (ref 19–32)
Calcium: 9.7 mg/dL (ref 8.4–10.5)
Chloride: 100 mEq/L (ref 96–112)
Creatinine, Ser: 1.73 mg/dL — ABNORMAL HIGH (ref 0.40–1.20)
GFR: 28.51 mL/min — ABNORMAL LOW (ref >60.00)
Glucose, Bld: 107 mg/dL — ABNORMAL HIGH (ref 70–99)
Potassium: 4.6 mEq/L (ref 3.5–5.1)
Sodium: 136 mEq/L (ref 135–145)

## 2018-12-07 LAB — VITAMIN B12: Vitamin B-12: >1500 pg/mL — ABNORMAL HIGH (ref 211–911)

## 2018-12-08 ENCOUNTER — Encounter: Payer: Self-pay | Admitting: *Deleted

## 2018-12-12 ENCOUNTER — Other Ambulatory Visit: Payer: Self-pay | Admitting: Internal Medicine

## 2018-12-19 ENCOUNTER — Other Ambulatory Visit: Payer: Self-pay

## 2018-12-19 ENCOUNTER — Ambulatory Visit
Admission: RE | Admit: 2018-12-19 | Discharge: 2018-12-19 | Disposition: A | Discharge status: Home / Self Care | Payer: PPO | Source: Ambulatory Visit | Attending: Cardiothoracic Surgery | Admitting: Cardiothoracic Surgery

## 2018-12-19 ENCOUNTER — Other Ambulatory Visit: Payer: Self-pay | Admitting: Internal Medicine

## 2018-12-19 DIAGNOSIS — R918 Other nonspecific abnormal finding of lung field: Secondary | ICD-10-CM | POA: Insufficient documentation

## 2018-12-19 DIAGNOSIS — C3490 Malignant neoplasm of unspecified part of unspecified bronchus or lung: Secondary | ICD-10-CM | POA: Diagnosis not present

## 2018-12-23 ENCOUNTER — Ambulatory Visit: Payer: Self-pay | Admitting: Cardiothoracic Surgery

## 2018-12-26 ENCOUNTER — Other Ambulatory Visit: Payer: Self-pay

## 2018-12-26 ENCOUNTER — Encounter: Payer: Self-pay | Admitting: Cardiothoracic Surgery

## 2018-12-26 ENCOUNTER — Ambulatory Visit: Payer: PPO | Admitting: Cardiothoracic Surgery

## 2018-12-26 VITALS — BP 174/87 | HR 80 | Temp 97.5°F | Resp 16 | Ht 64.0 in | Wt 163.2 lb

## 2018-12-26 DIAGNOSIS — R918 Other nonspecific abnormal finding of lung field: Secondary | ICD-10-CM

## 2018-12-26 NOTE — Patient Instructions (Addendum)
Please call our office if you have questions or concerns.  We will send the referral to pulmonary. Someone form their office will call to schedule an appointment. Please call our office if you have not heard from his office by Friday.  We will contact you in December 2020 to schedule CT lung in January 2021.

## 2018-12-26 NOTE — Progress Notes (Signed)
  Patient ID: Caitlin Lin, female   DOB: 1940/08/05, 78 y.o.   MRN: [id number]  HISTORY: She returns today in follow-up.  I had previously seen her several months ago for a nodular opacification in her right upper lobe.  She has no complaints.  She states that she has some seasonal allergies and that she might cough every now and then but does not feel like this is a predominant symptom of hers.  She has had no fevers or chills.  She is not short of breath.  She did have a repeat CT scan made last week.  She comes in today to discuss those results.   Vitals:   12/26/18 0935  BP: (!) 174/87  Pulse: 80  Resp: 16  Temp: (!) 97.5 F (36.4 C)  SpO2: 98%     EXAM:    Resp: Lungs are clear bilaterally.  No respiratory distress, normal effort. Heart:  Regular without murmurs Abd:  Abdomen is soft, non distended and non tender. No masses are palpable.  There is no rebound and no guarding.  Neurological: Alert and oriented to person, place, and time. Coordination normal.  Skin: Skin is warm and dry. No rash noted. No diaphoretic. No erythema. No pallor.  Psychiatric: Normal mood and affect. Normal behavior. Judgment and thought content normal.    ASSESSMENT: I have independently reviewed the patient's CT scan.  The nodular opacity in the right upper lobe is coalesced into a larger area now measuring about 3.5 cm.  It has the typical appearance of an inflammatory or infectious process although malignancy cannot be totally excluded.   PLAN:   I had a long discussion with her and her husband today regarding options.  I think that given the persistence of this opacity that a bronchoscopy may be in order.  I would like to ask Dr. Woody Seller to see the patient for his consultation regarding the need for additional diagnostic procedures.  The husband has an established relationship with him and has requested him to evaluate the patient.  I will have her come back to see me in 3 months but will  await Dr. Laurin Coder further recommendations    Nestor Lewandowsky, MD

## 2018-12-30 ENCOUNTER — Telehealth: Payer: Self-pay

## 2018-12-30 NOTE — Telephone Encounter (Signed)
Spoke with Melina Schools office regarding referral that was sent to [facility] on 12/26/2018. She will reach out to patient today to schedule an appointment.

## 2019-01-03 ENCOUNTER — Telehealth: Payer: Self-pay | Admitting: Pulmonary Disease

## 2019-01-03 ENCOUNTER — Ambulatory Visit: Payer: PPO | Admitting: Pulmonary Disease

## 2019-01-03 ENCOUNTER — Encounter: Payer: Self-pay | Admitting: Pulmonary Disease

## 2019-01-03 ENCOUNTER — Other Ambulatory Visit: Payer: Self-pay

## 2019-01-03 ENCOUNTER — Other Ambulatory Visit: Payer: PPO

## 2019-01-03 VITALS — BP 146/88 | HR 83 | Temp 97.6°F | Ht 64.0 in | Wt 162.0 lb

## 2019-01-03 DIAGNOSIS — Z85820 Personal history of malignant melanoma of skin: Secondary | ICD-10-CM

## 2019-01-03 DIAGNOSIS — R918 Other nonspecific abnormal finding of lung field: Secondary | ICD-10-CM | POA: Diagnosis not present

## 2019-01-03 DIAGNOSIS — Z9889 Other specified postprocedural states: Secondary | ICD-10-CM

## 2019-01-03 DIAGNOSIS — K449 Diaphragmatic hernia without obstruction or gangrene: Secondary | ICD-10-CM | POA: Diagnosis not present

## 2019-01-03 DIAGNOSIS — K219 Gastro-esophageal reflux disease without esophagitis: Secondary | ICD-10-CM | POA: Diagnosis not present

## 2019-01-03 NOTE — Telephone Encounter (Signed)
Bronch with nav and cellvizio scheduled for 01/09/2019 at 1:00. Dx lung mass [id number]

## 2019-01-03 NOTE — Patient Instructions (Signed)
1.  We have reviewed your CT scans today.  As we discussed it appears that this could be either an infection however we cannot exclude anything more serious.  Since you are not bringing up any sputum or coughing, the best way to sample this would be with bronchoscopy which would be done under general anesthesia as we would have to do what is called navigational bronchoscopy.  We will schedule the procedure for 23 October at 1 PM.  2.  During the procedure we will collect samples for cultures as well as to be seen and checked under the microscope.  3.  We will see you in follow-up in 1 to 2 weeks after the procedure.

## 2019-01-03 NOTE — H&P (View-Only) (Signed)
Subjective:    Patient ID: Caitlin Lin, female    DOB: 1940-12-01, 78 y.o.   MRN: [id number]  HPI [name] is a delightful 78 year old, lifelong never smoker, who presents for evaluation of an abnormal CT of the chest.  In particular nodular densities as noted below.  She is kindly referred by Dr. Nestor Lewandowsky, primary care physician is Dr. Einar Pheasant.  Patient was noted to have nodular lesions in the LEFT and RIGHT upper lobes were an incidental finding when she had a CT abdomen and pelvis performed on May 2020.  This abdominal CT was performed for evaluation of gallbladder and abdominal discomfort.  The patient has been asymptomatic with regards to these lesions.  She underwent dedicated CT chest without contrast on 02 September 2018 and this showed multinodular lingular (LEFT) lesion measuring up to 1.4 cm and cluster of tree-in-bud nodularity in the posterior RIGHT upper lobe.  These were consistent with atypical infection.  A PET/CT however, was performed on July 1 and showed the branching lesions as noted with moderate FDG uptake.  The recommendation at that time was to repeat CT in 3 months to undergo percutaneous biopsy.  At that time the decision was to repeat CT in 3 months which was reasonable.  Patient had CT scan on 5 October that showed progressive nodular consolidation in the posterior segment of the RIGHT upper lobe had increased in size, and has now coalesced and is measuring 2.1 x 2.7 cm.  There are also some post operative changes on the right lower lobe (from prior benign lesion excision).  The LEFT upper lobe nodular consolidation remains stable from June 2020.  We discussed the potential etiologies of these findings.  Likelihood is either atypical infection or potential carcinoma.  The best course of action in this regard would be to proceed with bronchoscopy for biopsies AND cultures.  Given that the lesion on the RIGHT upper lobe has increased in size this should be pursued.   The patient also has a history of melanoma excised in 2013 and melanoma has a tendency to have unusual metastases in the chest.  The patient understands the potential benefits, limitations and complications of navigational bronchoscopy under general anesthesia.  She would like to proceed with this.  Patient does not endorse any fevers, chills or sweats.  She has had issues with mild shortness of breath for approximately 4 months but no chest pain, paroxysmal nocturnal dyspnea or orthopnea.  Feels that her shortness of breath is related to anxiety as she is very concerned about her CT findings.  She notes that when she is "relaxed" she does not feel short of breath.  Patient does have issues with gastroesophageal reflux and has a known hiatal hernia.  She is currently on Protonix and Reglan for these issues.  Past medical history, surgical history and family history have been reviewed.  They are as noted.  Social history she is a lifelong never smoker.  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  She does not have any exotic pets.  Has a cat in the home.  She has resited in [place] most of her life just a brief time in [place] between [phone].  No exposure to TB.  No unusual hobbies.   Pertinent past medical history: Patient had thoracotomy on the right with resection of a benign tumor approximately 10 years ago.   Review of Systems  Constitutional: Negative.   HENT: Positive for  congestion and postnasal drip.   Eyes: Positive for itching.  Respiratory: Positive for shortness of breath.   Cardiovascular: Negative.   Gastrointestinal:       Significant reflux symptoms.  Endocrine: Negative.   Genitourinary: Negative.   Musculoskeletal: Negative.   Skin: Negative.   Allergic/Immunologic: Negative.   Neurological: Negative.   Hematological: Negative.   Psychiatric/Behavioral: Negative.   All other systems reviewed and are negative.      Objective:   Physical  Exam Vitals signs and nursing note reviewed.  Constitutional:      General: She is not in acute distress.    Appearance: Normal appearance.  HENT:     Head: Normocephalic and atraumatic.     Right Ear: External ear normal.     Left Ear: External ear normal.     Nose:     Comments: Nose/mouth/throat not examined due to masking requirements for COVID 19. Eyes:     General: No scleral icterus.    Conjunctiva/sclera:     Right eye: Right conjunctiva is injected (Mildly).     Left eye: Left conjunctiva is injected (Mildly).     Pupils: Pupils are equal, round, and reactive to light.  Neck:     Musculoskeletal: Neck supple.     Thyroid: No thyromegaly.     Trachea: Trachea and phonation normal.  Cardiovascular:     Rate and Rhythm: Normal rate and regular rhythm.     Pulses: Normal pulses.     Heart sounds: Normal heart sounds. No murmur.  Pulmonary:     Effort: Pulmonary effort is normal.     Breath sounds: Normal breath sounds.  Abdominal:     General: There is no distension.  Musculoskeletal: Normal range of motion.     Right lower leg: No edema.     Left lower leg: No edema.  Lymphadenopathy:     Cervical: No cervical adenopathy.  Skin:    General: Skin is warm and dry.  Neurological:     General: No focal deficit present.     Mental Status: She is alert and oriented to person, place, and time.  Psychiatric:        Mood and Affect: Mood normal.        Behavior: Behavior normal.       Assessment & Plan:   1.  Lung nodules: Patient has BILATERAL lung nodules.  However, the most dominant nodule is on the RIGHT upper lobe which has grown since first seen on CT in June 2020.  This lesion now measures 2.1 cm x 2.7 cm.  It has moderate FDG avidity by prior PET/CT.  Differential diagnosis includes atypical infection (fungal/MAI) versus malignancy.  Note that the patient has prior history of melanoma.  Evaluation of this nodule/mass will require biopsy for histology and cytology  as well as for cultures.  This can be achieved via navigational bronchoscopy under general anesthesia.  Benefits, limitations and potential complications of the procedure were discussed with the patient/family  including, but not limited to bleeding, hemoptysis, respiratory failure requiring intubation and/or prolongued mechanical ventilation, infection, pneumothorax (collapse of lung) requiring chest tube placement, stroke from air embolism or even death.  Patient has agreed to proceed with navigational bronchoscopy.  Patient was initially scheduled for 23 October however due to a scheduling conflict will reschedule for 26 October at 1 PM.  Patient will need COVID-19 testing prior to the procedure, this will be scheduled. The previously noted LEFT lingular mass has remained stable  and has less FDG avidity.  It is consistent with an inflammatory/infectious process.  This mass will not be biopsied.  2.  Gastroesophageal reflux with hiatal hernia: This issue adds complexity to her management.  We will have to take this into consideration being anesthesia induction.  Patient must follow strict n.p.o. prior to procedure.  3.  History of melanoma lower extremity, excised 2013: This issue adds complexity to her management vis--vis her lung nodules.  Thank you for allowing me to participate in this patient's care.  See orders for details.   This chart was dictated using voice recognition software/Dragon.  Despite best efforts to proofread, errors can occur which can change the meaning.  Any change was purely unintentional.

## 2019-01-03 NOTE — Telephone Encounter (Signed)
Case started with [facility]. Case # [id number]. Caitlin Lin

## 2019-01-03 NOTE — Telephone Encounter (Signed)
Pre admit testing is scheduled for 10/21/020 at 1:30.  covid testing 01/05/2019 between 12:30 and 2:30. Left message to make pt aware.

## 2019-01-03 NOTE — Telephone Encounter (Signed)
Pt is aware of below date/times and voiced her understanding.

## 2019-01-03 NOTE — Telephone Encounter (Signed)
Left message x2 for pt. 

## 2019-01-03 NOTE — Progress Notes (Signed)
Subjective:    Patient ID: Caitlin Lin, female    DOB: 03/20/1940, 78 y.o.   MRN: [id number]  HPI Caitlin Lin is a delightful 78 year old, lifelong never smoker, who presents for evaluation of an abnormal CT of the chest.  In particular nodular densities as noted below.  She is kindly referred by Dr. Nestor Lewandowsky, primary care physician is Dr. Einar Pheasant.  Patient was noted to have nodular lesions in the LEFT and RIGHT upper lobes were an incidental finding when she had a CT abdomen and pelvis performed on May 2020.  This abdominal CT was performed for evaluation of gallbladder and abdominal discomfort.  The patient has been asymptomatic with regards to these lesions.  She underwent dedicated CT chest without contrast on 02 September 2018 and this showed multinodular lingular (LEFT) lesion measuring up to 1.4 cm and cluster of tree-in-bud nodularity in the posterior RIGHT upper lobe.  These were consistent with atypical infection.  A PET/CT however, was performed on July 1 and showed the branching lesions as noted with moderate FDG uptake.  The recommendation at that time was to repeat CT in 3 months to undergo percutaneous biopsy.  At that time the decision was to repeat CT in 3 months which was reasonable.  Patient had CT scan on 5 October that showed progressive nodular consolidation in the posterior segment of the RIGHT upper lobe had increased in size, and has now coalesced and is measuring 2.1 x 2.7 cm.  There are also some post operative changes on the right lower lobe (from prior benign lesion excision).  The LEFT upper lobe nodular consolidation remains stable from June 2020.  We discussed the potential etiologies of these findings.  Likelihood is either atypical infection or potential carcinoma.  The best course of action in this regard would be to proceed with bronchoscopy for biopsies AND cultures.  Given that the lesion on the RIGHT upper lobe has increased in size this should be pursued.   The patient also has a history of melanoma excised in 2013 and melanoma has a tendency to have unusual metastases in the chest.  The patient understands the potential benefits, limitations and complications of navigational bronchoscopy under general anesthesia.  She would like to proceed with this.  Patient does not endorse any fevers, chills or sweats.  She has had issues with mild shortness of breath for approximately 4 months but no chest pain, paroxysmal nocturnal dyspnea or orthopnea.  Feels that her shortness of breath is related to anxiety as she is very concerned about her CT findings.  She notes that when she is "relaxed" she does not feel short of breath.  Patient does have issues with gastroesophageal reflux and has a known hiatal hernia.  She is currently on Protonix and Reglan for these issues.  Past medical history, surgical history and family history have been reviewed.  They are as noted.  Social history she is a lifelong never smoker.  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  She does not have any exotic pets.  Has a cat in the home.  She has resited in [place] most of her life just a brief time in [place] between [phone].  No exposure to TB.  No unusual hobbies.   Pertinent past medical history: Patient had thoracotomy on the right with resection of a benign tumor approximately 10 years ago.   Review of Systems  Constitutional: Negative.   HENT: Positive for  congestion and postnasal drip.   Eyes: Positive for itching.  Respiratory: Positive for shortness of breath.   Cardiovascular: Negative.   Gastrointestinal:       Significant reflux symptoms.  Endocrine: Negative.   Genitourinary: Negative.   Musculoskeletal: Negative.   Skin: Negative.   Allergic/Immunologic: Negative.   Neurological: Negative.   Hematological: Negative.   Psychiatric/Behavioral: Negative.   All other systems reviewed and are negative.      Objective:   Physical  Exam Vitals signs and nursing note reviewed.  Constitutional:      General: She is not in acute distress.    Appearance: Normal appearance.  HENT:     Head: Normocephalic and atraumatic.     Right Ear: External ear normal.     Left Ear: External ear normal.     Nose:     Comments: Nose/mouth/throat not examined due to masking requirements for COVID 19. Eyes:     General: No scleral icterus.    Conjunctiva/sclera:     Right eye: Right conjunctiva is injected (Mildly).     Left eye: Left conjunctiva is injected (Mildly).     Pupils: Pupils are equal, round, and reactive to light.  Neck:     Musculoskeletal: Neck supple.     Thyroid: No thyromegaly.     Trachea: Trachea and phonation normal.  Cardiovascular:     Rate and Rhythm: Normal rate and regular rhythm.     Pulses: Normal pulses.     Heart sounds: Normal heart sounds. No murmur.  Pulmonary:     Effort: Pulmonary effort is normal.     Breath sounds: Normal breath sounds.  Abdominal:     General: There is no distension.  Musculoskeletal: Normal range of motion.     Right lower leg: No edema.     Left lower leg: No edema.  Lymphadenopathy:     Cervical: No cervical adenopathy.  Skin:    General: Skin is warm and dry.  Neurological:     General: No focal deficit present.     Mental Status: She is alert and oriented to person, place, and time.  Psychiatric:        Mood and Affect: Mood normal.        Behavior: Behavior normal.       Assessment & Plan:   1.  Lung nodules: Patient has BILATERAL lung nodules.  However, the most dominant nodule is on the RIGHT upper lobe which has grown since first seen on CT in June 2020.  This lesion now measures 2.1 cm x 2.7 cm.  It has moderate FDG avidity by prior PET/CT.  Differential diagnosis includes atypical infection (fungal/MAI) versus malignancy.  Note that the patient has prior history of melanoma.  Evaluation of this nodule/mass will require biopsy for histology and cytology  as well as for cultures.  This can be achieved via navigational bronchoscopy under general anesthesia.  Benefits, limitations and potential complications of the procedure were discussed with the patient/family  including, but not limited to bleeding, hemoptysis, respiratory failure requiring intubation and/or prolongued mechanical ventilation, infection, pneumothorax (collapse of lung) requiring chest tube placement, stroke from air embolism or even death.  Patient has agreed to proceed with navigational bronchoscopy.  Patient was initially scheduled for 23 October however due to a scheduling conflict will reschedule for 26 October at 1 PM.  Patient will need COVID-19 testing prior to the procedure, this will be scheduled. The previously noted LEFT lingular mass has remained stable  and has less FDG avidity.  It is consistent with an inflammatory/infectious process.  This mass will not be biopsied.  2.  Gastroesophageal reflux with hiatal hernia: This issue adds complexity to her management.  We will have to take this into consideration being anesthesia induction.  Patient must follow strict n.p.o. prior to procedure.  3.  History of melanoma lower extremity, excised 2013: This issue adds complexity to her management vis--vis her lung nodules.  Thank you for allowing me to participate in this patient's care.  See orders for details.   This chart was dictated using voice recognition software/Dragon.  Despite best efforts to proofread, errors can occur which can change the meaning.  Any change was purely unintentional.

## 2019-01-04 ENCOUNTER — Other Ambulatory Visit: Payer: Self-pay

## 2019-01-04 ENCOUNTER — Other Ambulatory Visit: Payer: PPO

## 2019-01-04 ENCOUNTER — Encounter
Admission: RE | Admit: 2019-01-04 | Discharge: 2019-01-04 | Disposition: A | Discharge status: Home / Self Care | Payer: PPO | Source: Ambulatory Visit | Attending: Pulmonary Disease | Admitting: Pulmonary Disease

## 2019-01-04 HISTORY — DX: Personal history of other diseases of the digestive system: Z87.19

## 2019-01-04 HISTORY — DX: Cardiac murmur, unspecified: R01.1

## 2019-01-04 NOTE — Patient Instructions (Addendum)
Your COVID swab is scheduled on: Thursday January 05, 2019. Drive up in front of the [facility].  Your procedure is scheduled on: Monday January 09, 2019 Report to Same Day Surgery 2nd floor Medical Mall [facility] Entrance-take elevator on left to 2nd floor.  Check in with surgery information desk.) To find out your arrival time, call [phone] 1:00-3:00 PM on Friday January 06, 2019  Remember: Instructions that are not followed completely may result in serious medical risk, up to and including death, or upon the discretion of your surgeon and anesthesiologist your surgery may need to be rescheduled.    __x__ 1. Do not eat food (including mints, candies, chewing gum) after midnight the night before your procedure. You may drink clear liquids up to 2 hours before you are scheduled to arrive at the hospital for your procedure.  Do not drink anything within 2 hours of your scheduled arrival to the hospital.  Approved clear liquids:  --Water or Apple juice without pulp  --Clear carbohydrate beverage such as Gatorade or Powerade  --Black Coffee or Clear Tea (No milk, no creamers, do not add anything to the coffee or tea)    __x__ 2. No Alcohol for 24 hours before or after surgery.   __x__ 3. No Smoking or e-cigarettes for 24 hours before surgery.  Do not use any chewable tobacco products for at least 6 hours before surgery.   __x__ 4. Notify your doctor if there is any change in your medical condition (cold, fever, infections).   __x__ 5. On the morning of surgery brush your teeth with toothpaste and water.  You may rinse your mouth with mouthwash if you wish.  Do not swallow any toothpaste or mouthwash.  Please read over the following fact sheets that you were given:   [facility] Preparing for Surgery and/or MRSA Information    __x__ Use antibacterial soap such as Dial if available to shower/bathe on the day of surgery.   Do not wear jewelry, make-up, hairpins, clips or nail  polish on the day of surgery.  Do not wear lotions, powders, deodorant, or perfumes.   Do not shave below the face/neck 48 hours prior to surgery.   Do not bring valuables to the hospital.    [facility] is not responsible for any belongings or valuables.               Contacts, dentures or bridgework may not be worn into surgery.  For patients discharged on the day of surgery, you will NOT be permitted to drive yourself home.  You must have a responsible adult with you for 24 hours after surgery.  __x__ Take these medicines on the morning of surgery with a SMALL SIP OF WATER:  1. Amlodipine (Norvasc)  2. Fexofenadine (Allegra)  3. Levothyroxine (Synthroid)  4. Pantoprazole (Protonix)   Skip your Losartan (Cozaar) only on the morning of surgery.  __x__ Follow recommendations from Cardiologist, Pulmonologist or PCP regarding stopping Aspirin, Coumadin, Plavix, Eliquis, Effient, Pradaxa, and Pletal.  __x__ STARTING TODAY: Stop Anti-inflammatories such as aspirin, Advil, Ibuprofen, Motrin, Aleve, Naproxen, Naprosyn, BC/Goodies powders. You may continue to take Tylenol and Celebrex.   __x__ STARTING TODAY: Stop supplements (Cranberry, Glucosamine- Chondroitin, Omega-3/ Fish Oil) until after surgery. You may continue to take Vitamin D, Vitamin B, and multivitamin.

## 2019-01-05 ENCOUNTER — Other Ambulatory Visit
Admission: RE | Admit: 2019-01-05 | Discharge: 2019-01-05 | Disposition: A | Discharge status: Home / Self Care | Payer: PPO | Source: Ambulatory Visit | Attending: Pulmonary Disease | Admitting: Pulmonary Disease

## 2019-01-05 ENCOUNTER — Ambulatory Visit (INDEPENDENT_AMBULATORY_CARE_PROVIDER_SITE_OTHER): Payer: PPO

## 2019-01-05 DIAGNOSIS — Z01812 Encounter for preprocedural laboratory examination: Secondary | ICD-10-CM | POA: Diagnosis not present

## 2019-01-05 DIAGNOSIS — I351 Nonrheumatic aortic (valve) insufficiency: Secondary | ICD-10-CM

## 2019-01-05 DIAGNOSIS — Z20828 Contact with and (suspected) exposure to other viral communicable diseases: Secondary | ICD-10-CM | POA: Diagnosis not present

## 2019-01-05 LAB — SARS CORONAVIRUS 2 (TAT 6-24 HRS): SARS Coronavirus 2: NEGATIVE

## 2019-01-05 NOTE — Telephone Encounter (Signed)
Procedure code [id number] has been approved by [facility].  Authorization # [id number]. Rhonda J Cobb

## 2019-01-06 ENCOUNTER — Telehealth: Payer: Self-pay

## 2019-01-06 NOTE — Telephone Encounter (Signed)
-----   Message from Kate Sable, MD sent at 01/06/2019 12:51 PM EDT ----- Normal test.

## 2019-01-06 NOTE — Telephone Encounter (Signed)
Patient made aware of echo results with verbalized understanding. 

## 2019-01-09 ENCOUNTER — Ambulatory Visit
Admission: RE | Admit: 2019-01-09 | Discharge: 2019-01-09 | Disposition: A | Discharge status: Home / Self Care | Payer: PPO | Attending: Pulmonary Disease | Admitting: Pulmonary Disease

## 2019-01-09 ENCOUNTER — Ambulatory Visit: Payer: PPO

## 2019-01-09 ENCOUNTER — Ambulatory Visit: Payer: PPO | Admitting: Anesthesiology

## 2019-01-09 ENCOUNTER — Other Ambulatory Visit: Payer: Self-pay

## 2019-01-09 ENCOUNTER — Encounter
Admission: RE | Disposition: A | Discharge status: Home / Self Care | Payer: Self-pay | Source: Home / Self Care | Attending: Pulmonary Disease

## 2019-01-09 DIAGNOSIS — K219 Gastro-esophageal reflux disease without esophagitis: Secondary | ICD-10-CM | POA: Diagnosis not present

## 2019-01-09 DIAGNOSIS — I129 Hypertensive chronic kidney disease with stage 1 through stage 4 chronic kidney disease, or unspecified chronic kidney disease: Secondary | ICD-10-CM | POA: Insufficient documentation

## 2019-01-09 DIAGNOSIS — Z85820 Personal history of malignant melanoma of skin: Secondary | ICD-10-CM | POA: Diagnosis not present

## 2019-01-09 DIAGNOSIS — N184 Chronic kidney disease, stage 4 (severe): Secondary | ICD-10-CM | POA: Diagnosis not present

## 2019-01-09 DIAGNOSIS — N189 Chronic kidney disease, unspecified: Secondary | ICD-10-CM | POA: Insufficient documentation

## 2019-01-09 DIAGNOSIS — K449 Diaphragmatic hernia without obstruction or gangrene: Secondary | ICD-10-CM | POA: Insufficient documentation

## 2019-01-09 DIAGNOSIS — R918 Other nonspecific abnormal finding of lung field: Secondary | ICD-10-CM

## 2019-01-09 DIAGNOSIS — J984 Other disorders of lung: Secondary | ICD-10-CM | POA: Insufficient documentation

## 2019-01-09 DIAGNOSIS — E039 Hypothyroidism, unspecified: Secondary | ICD-10-CM | POA: Diagnosis not present

## 2019-01-09 DIAGNOSIS — Z9889 Other specified postprocedural states: Secondary | ICD-10-CM

## 2019-01-09 DIAGNOSIS — J9811 Atelectasis: Secondary | ICD-10-CM | POA: Diagnosis not present

## 2019-01-09 DIAGNOSIS — R739 Hyperglycemia, unspecified: Secondary | ICD-10-CM | POA: Diagnosis not present

## 2019-01-09 HISTORY — PX: ELECTROMAGNETIC NAVIGATION BROCHOSCOPY: SHX5369

## 2019-01-09 LAB — POCT I-STAT, CHEM 8
BUN: 28 mg/dL — ABNORMAL HIGH (ref 8–23)
Calcium, Ion: 1.27 mmol/L (ref 1.15–1.40)
Chloride: 109 mmol/L (ref 98–111)
Creatinine, Ser: 1.7 mg/dL — ABNORMAL HIGH (ref 0.44–1.00)
Glucose, Bld: 98 mg/dL (ref 70–99)
HCT: 39 % (ref 36.0–46.0)
Hemoglobin: 13.3 g/dL (ref 12.0–15.0)
Potassium: 4 mmol/L (ref 3.5–5.1)
Sodium: 142 mmol/L (ref 135–145)
TCO2: 22 mmol/L (ref 22–32)

## 2019-01-09 SURGERY — ELECTROMAGNETIC NAVIGATION BRONCHOSCOPY
Anesthesia: General | Laterality: Right

## 2019-01-09 MED ORDER — PROPOFOL 10 MG/ML IV BOLUS
INTRAVENOUS | Status: DC | PRN
  Administered 2019-01-09: 140 mg via INTRAVENOUS

## 2019-01-09 MED ORDER — SUGAMMADEX SODIUM 200 MG/2ML IV SOLN
INTRAVENOUS | Status: DC | PRN
  Administered 2019-01-09: 150 mg via INTRAVENOUS

## 2019-01-09 MED ORDER — PHENYLEPHRINE HCL (PRESSORS) 10 MG/ML IV SOLN
INTRAVENOUS | Status: AC
  Filled 2019-01-09: qty 1

## 2019-01-09 MED ORDER — LIDOCAINE HCL (CARDIAC) PF 100 MG/5ML IV SOSY
PREFILLED_SYRINGE | INTRAVENOUS | Status: DC | PRN
  Administered 2019-01-09: 60 mg via INTRAVENOUS

## 2019-01-09 MED ORDER — BUTAMBEN-TETRACAINE-BENZOCAINE 2-2-14 % EX AERO
1.0000 | INHALATION_SPRAY | Freq: Once | CUTANEOUS | Status: DC
  Filled 2019-01-09: qty 20

## 2019-01-09 MED ORDER — PHENYLEPHRINE HCL (PRESSORS) 10 MG/ML IV SOLN
INTRAVENOUS | Status: DC | PRN
  Administered 2019-01-09: 100 ug via INTRAVENOUS

## 2019-01-09 MED ORDER — ONDANSETRON HCL 4 MG/2ML IJ SOLN
INTRAMUSCULAR | Status: DC | PRN
  Administered 2019-01-09: 4 mg via INTRAVENOUS

## 2019-01-09 MED ORDER — LIDOCAINE HCL (PF) 2 % IJ SOLN
INTRAMUSCULAR | Status: AC
  Filled 2019-01-09: qty 10

## 2019-01-09 MED ORDER — SODIUM CHLORIDE 0.9 % IV SOLN
INTRAVENOUS | Status: DC
  Administered 2019-01-09: 12:00:00 via INTRAVENOUS

## 2019-01-09 MED ORDER — SODIUM CHLORIDE 0.9 % IV SOLN: Freq: Once | INTRAVENOUS | Status: DC

## 2019-01-09 MED ORDER — FENTANYL CITRATE (PF) 100 MCG/2ML IJ SOLN
INTRAMUSCULAR | Status: AC
  Filled 2019-01-09: qty 2

## 2019-01-09 MED ORDER — ONDANSETRON HCL 4 MG/2ML IJ SOLN
INTRAMUSCULAR | Status: AC
  Filled 2019-01-09: qty 2

## 2019-01-09 MED ORDER — ROCURONIUM BROMIDE 100 MG/10ML IV SOLN
INTRAVENOUS | Status: DC | PRN
  Administered 2019-01-09: 15 mg via INTRAVENOUS
  Administered 2019-01-09: 5 mg via INTRAVENOUS
  Administered 2019-01-09: 10 mg via INTRAVENOUS

## 2019-01-09 MED ORDER — SUCCINYLCHOLINE CHLORIDE 20 MG/ML IJ SOLN
INTRAMUSCULAR | Status: DC | PRN
  Administered 2019-01-09: 100 mg via INTRAVENOUS

## 2019-01-09 MED ORDER — DEXAMETHASONE SODIUM PHOSPHATE 10 MG/ML IJ SOLN
INTRAMUSCULAR | Status: DC | PRN
  Administered 2019-01-09: 4 mg via INTRAVENOUS

## 2019-01-09 MED ORDER — PROPOFOL 10 MG/ML IV BOLUS
INTRAVENOUS | Status: AC
  Filled 2019-01-09: qty 20

## 2019-01-09 MED ORDER — ONDANSETRON HCL 4 MG/2ML IJ SOLN: 4.0000 mg | Freq: Once | INTRAMUSCULAR | Status: DC | PRN

## 2019-01-09 MED ORDER — FENTANYL CITRATE (PF) 100 MCG/2ML IJ SOLN: 25.0000 ug | INTRAMUSCULAR | Status: DC | PRN

## 2019-01-09 MED ORDER — SUCCINYLCHOLINE CHLORIDE 20 MG/ML IJ SOLN
INTRAMUSCULAR | Status: AC
  Filled 2019-01-09: qty 1

## 2019-01-09 MED ORDER — DEXAMETHASONE SODIUM PHOSPHATE 4 MG/ML IJ SOLN
INTRAMUSCULAR | Status: AC
  Filled 2019-01-09: qty 1

## 2019-01-09 MED ORDER — ROCURONIUM BROMIDE 50 MG/5ML IV SOLN
INTRAVENOUS | Status: AC
  Filled 2019-01-09: qty 1

## 2019-01-09 MED ORDER — SUGAMMADEX SODIUM 200 MG/2ML IV SOLN
INTRAVENOUS | Status: AC
  Filled 2019-01-09: qty 2

## 2019-01-09 MED ORDER — FENTANYL CITRATE (PF) 100 MCG/2ML IJ SOLN
INTRAMUSCULAR | Status: DC | PRN
  Administered 2019-01-09: 50 ug via INTRAVENOUS

## 2019-01-09 NOTE — Interval H&P Note (Signed)
History and Physical Interval Note:  01/09/2019 12:29 PM  Caitlin Lin  has presented today for surgery, with the diagnosis of lung mass.  The various methods of treatment have been discussed with the patient and family. After consideration of risks, benefits and other options for treatment, the patient has consented to  Procedure(s): ELECTROMAGNETIC NAVIGATION BRONCHOSCOPY (Right) as a surgical intervention.  The patient's history has been reviewed, patient examined, no change in status, stable for surgery.  I have reviewed the patient's chart and labs.  Questions were answered to the patient's satisfaction.     Renold Don, MD [place] [facility]

## 2019-01-09 NOTE — Anesthesia Preprocedure Evaluation (Signed)
Anesthesia Evaluation  Patient identified by MRN, date of birth, ID band Patient awake    Reviewed: Allergy & Precautions, H&P , NPO status , Patient's Chart, lab work & pertinent test results, reviewed documented beta blocker date and time   Airway Mallampati: II  TM Distance: >3 FB Neck ROM: full    Dental  (+) Teeth Intact   Pulmonary neg pulmonary ROS,    Pulmonary exam normal        Cardiovascular Exercise Tolerance: Good hypertension, On Medications negative cardio ROS Normal cardiovascular exam+ Valvular Problems/Murmurs  Rhythm:regular Rate:Normal     Neuro/Psych  Headaches, negative neurological ROS  negative psych ROS   GI/Hepatic negative GI ROS, Neg liver ROS, hiatal hernia, GERD  Medicated,  Endo/Other  negative endocrine ROSHypothyroidism   Renal/GU CRFRenal diseasenegative Renal ROS  negative genitourinary   Musculoskeletal   Abdominal   Peds  Hematology negative hematology ROS (+) Blood dyscrasia, anemia ,   Anesthesia Other Findings Past Medical History: No date: Cataracts, bilateral No date: Chronic kidney disease     Comment:  Followed by Dr. Holley Raring No date: Chronic kidney disease No date: Colon polyp No date: Constipation due to slow transit No date: Diarrhea No date: Diarrhea No date: GERD (gastroesophageal reflux disease) No date: Heart murmur No date: History of cyst of breast No date: History of hiatal hernia No date: Hyperlipidemia No date: Hypertension No date: Hypothyroidism 2013: Melanoma of lower leg ([place]) 2013: Shingles No date: Shingles No date: Spinal stenosis in cervical region     Comment:  Dr. Sharlet Salina No date: Subdural hematoma ([place]) No date: Thyroid disease Past Surgical History: 1974: ABDOMINAL HYSTERECTOMY No date: ANTERIOR AND POSTERIOR VAGINAL REPAIR No date: ANTERIOR AND POSTERIOR VAGINAL REPAIR W/ SACROSPINOUS  LIGAMENT SUSPENSION; Right 1963: BREAST  BIOPSY     Comment:  Benign per pt's report No date: COLONOSCOPY No date: ESOPHAGOGASTRODUODENOSCOPY 11/10/2016: ESOPHAGOGASTRODUODENOSCOPY (EGD) WITH PROPOFOL; N/A     Comment:  Procedure: ESOPHAGOGASTRODUODENOSCOPY (EGD) WITH               PROPOFOL;  Surgeon: Lollie Sails, MD;  Location:               [facility] ENDOSCOPY;  Service: Endoscopy;  Laterality: N/A; 09/09/2018: ESOPHAGOGASTRODUODENOSCOPY (EGD) WITH PROPOFOL; N/A     Comment:  Procedure: ESOPHAGOGASTRODUODENOSCOPY (EGD) WITH               PROPOFOL;  Surgeon: Lollie Sails, MD;  Location:               [facility] ENDOSCOPY;  Service: Endoscopy;  Laterality: N/A; 2007: LUNG BIOPSY     Comment:  Benign per pt's report No date: LUNG BIOPSY; Right 1970: OOPHORECTOMY 12/17/2013: PERINEOPLASTY 12/18/2011: sling for stress incontinence   Reproductive/Obstetrics negative OB ROS                             Anesthesia Physical Anesthesia Plan  ASA: III  Anesthesia Plan: General ETT   Post-op Pain Management:    Induction:   PONV Risk Score and Plan:   Airway Management Planned:   Additional Equipment:   Intra-op Plan:   Post-operative Plan:   Informed Consent: I have reviewed the patients History and Physical, chart, labs and discussed the procedure including the risks, benefits and alternatives for the proposed anesthesia with the patient or authorized representative who has indicated his/her understanding and acceptance.     Dental Advisory Given  Plan Discussed with: CRNA  Anesthesia Plan Comments:         Anesthesia Quick Evaluation

## 2019-01-09 NOTE — Op Note (Signed)
1.Electromagnetic Navigation Bronchoscopy 2.Endomicroscopy  Indication: RIGHT upper lobe lung nodule/mass, potential infectious/inflammatory versus malignancy.  Patient has history of melanoma excision in 2013.  Preoperative Diagnosis: RIGHT upper lobe lung nodule/mass Post Procedure Diagnosis: Same as above   Consent: Verbal/Written  Operator: Renold Don, MD CRNA: Dionne Bucy Assistant/scrub: Sullivan Lone, RRT Circulator: None    Benefits, limitations and potential complications of the procedure were discussed with the patient/family  including, but not limited to bleeding, hemoptysis, respiratory failure requiring intubation and/or prolongued mechanical ventilation, infection, pneumothorax (collapse of lung) requiring chest tube placement, stroke from air embolism or even death.  Patient understood the above and agreed to proceed.  Hand washing performed prior to starting the procedure.   Type of Anesthesia: General endotracheal anesthesia   Procedure Performed: 1) virtual Bronchoscopy with Multi-planar Image analysis, 3-D reconstruction of coronal, sagittal and multi-planar images for the purposes of planning real-time bronchoscopy using the iLogic Electromagnetic Navigation Bronchoscopy System (superDimension). 2) EndoMicroscopy-Cellvizio  Description of Procedure: The patient was taken to procedure room 2 in the OR area (bronchoscopy lab).  Appropriate timeout was taken with the staff.  Patient was inducted under general anesthesia by CRNA and anesthesiologist.  She was intubated with an 8.5 ET tube without difficulty.  A Portex adapter was placed on the ET tube.  Once the patient had achieved adequate general anesthesia the Olympus video therapeutic bronchoscope was advanced through the existing Portex adapter into the airway.  The visible trachea was normal.  Carina was sharp.  Right upper lobe, right middle lobe, and right lower lobe subsegment were all without lesions.   Mucosa was somewhat friable.  Bronchoscope was then brought back to the carina level and advanced to the left tracheobronchial tree.  Airway was examined and again no endobronchial lesions were noted.  Some friability of the mucosa was noted.  At this point and extended super dimension working channel and locatable guide (LG) where placed through the working channel of the bronchoscope.  The locatable guide was then placed at the central portion of the trachea.The LG was was then directed to standard registration points at the following centers: main carina, right upper lobe bronchus, right lower lobe bronchus, right middle lobe bronchus, left upper lobe bronchus, and the left lower lobe bronchus. This data was transferred to the i-Logic ENB system for real-time bronchoscopy.  Once registration was completed the bronchoscope was navigated to the RIGHT upper lobe posterior subsegment where the target was acquired.  Confirmation of adequate position was done with the LG in place and fluoroscopic imaging.  Once the confirmation was obtained, LG was removed and the Cellvizio and the microscopy probe was placed through the extended working channel.  The end of microscope showed a solid component with inflammation and this was used as confirmation of target acquisition.  Images were saved to the [facility] tower.  At this point brushings were performed x3 and biopsies x3 were done.  ROSE showed mostly reactive cells and inflammation.  At this point the LG was then placed back into the extended working channel and the probe was redirected utilizing a second pathway on the navigational plan.  Confirmation was done as previous.  Cellvizio on the microscopy was then performed showing again solid component with associated inflammation.  At this point brushings x3 were obtained with fluoroscopic guidance.  In addition, fluoroscopically guided biopsies x2 for culture and biopsies x5 for histology were obtained.  Once this was  completed targeted bronchoalveolar lavage was performed with 40  mL of saline instilled and approximately 9 mL of aliquot obtained, this was all sent for cultures.  Cultures included routine, AFB and fungal.  Having completed this, the extended working channel was removed and the patient received a total of 9 mL of 1% lidocaine via bronchial lavage after adequate hemostasis was confirmed.  Bronchoscope was then retrieved and the patient was allowed to emerge from general anesthesia.  She was extubated in the procedure room without sequela.  She was transferred to the PACU in satisfactory condition.  Postprocedure chest x-ray showed no pneumothorax.  No overt complications noted.  Specimens Obtained:  Transbronchial biopsies: Right upper lobe, x2 for culture, total x8 for histology  Transbronchial brushings: X6 passes total  Targeted bronchoalveolar lavage: Aliquot 9 mL, all for culture (routine, AFB, fungal)  Fluoroscopy:  Fluoroscopy was utilized during the course of this procedure to assure that biopsies were taken in a safe manner under fluoroscopic guidance with spot films as required.   Complications:None, postprocedure chest x-ray showed no pneumothorax.  Estimated Blood Loss:Nil   Impression: 1.  RIGHT upper lobe lesion, atypical infection/inflammation versus cancer. 2.  History of metastatic melanoma excision in the past  Plan:  1.  Await pathology report. 2.  Await culture results.   Renold Don, MD [facility]

## 2019-01-09 NOTE — Anesthesia Post-op Follow-up Note (Signed)
Anesthesia QCDR form completed.        

## 2019-01-09 NOTE — Transfer of Care (Signed)
Immediate Anesthesia Transfer of Care Note  Patient: Caitlin Lin  Procedure(s) Performed: ELECTROMAGNETIC NAVIGATION BRONCHOSCOPY (Right )  Patient Location: PACU  Anesthesia Type:General  Level of Consciousness: oriented, sedated, drowsy and patient cooperative  Airway & Oxygen Therapy: Patient Spontanous Breathing and Patient connected to face mask oxygen  Post-op Assessment: Report given to RN and Post -op Vital signs reviewed and stable  Post vital signs: Reviewed and stable  Last Vitals:  Vitals Value Taken Time  BP 144/72 01/09/19 1421  Temp    Pulse 71 01/09/19 1423  Resp 18 01/09/19 1423  SpO2 100 % 01/09/19 1423  Vitals shown include unvalidated device data.  Last Pain:  Vitals:   01/09/19 1148  TempSrc: Temporal  PainSc: 0-No pain         Complications: No apparent anesthesia complications

## 2019-01-09 NOTE — OR Nursing (Signed)
Anesthesia reviewed Chem 8 results from this am.

## 2019-01-09 NOTE — Anesthesia Procedure Notes (Signed)
Procedure Name: Intubation Date/Time: 01/09/2019 1:01 PM Performed by: Dionne Bucy, CRNA Pre-anesthesia Checklist: Patient identified, Patient being monitored, Timeout performed, Emergency Drugs available and Suction available Patient Re-evaluated:Patient Re-evaluated prior to induction Oxygen Delivery Method: Circle system utilized Preoxygenation: Pre-oxygenation with 100% oxygen Induction Type: IV induction Ventilation: Mask ventilation without difficulty Laryngoscope Size: 3 and McGraph Grade View: Grade I Tube type: Oral Tube size: 8.5 mm Number of attempts: 1 Airway Equipment and Method: Stylet Placement Confirmation: ETT inserted through vocal cords under direct vision,  positive ETCO2 and breath sounds checked- equal and bilateral Secured at: 21 cm Tube secured with: Tape Dental Injury: Teeth and Oropharynx as per pre-operative assessment

## 2019-01-09 NOTE — Discharge Instructions (Signed)

## 2019-01-10 ENCOUNTER — Encounter: Payer: Self-pay | Admitting: Cardiology

## 2019-01-10 ENCOUNTER — Ambulatory Visit (INDEPENDENT_AMBULATORY_CARE_PROVIDER_SITE_OTHER): Payer: PPO | Admitting: Cardiology

## 2019-01-10 VITALS — BP 140/80 | HR 70 | Temp 97.3°F | Ht 64.0 in | Wt 161.5 lb

## 2019-01-10 DIAGNOSIS — I351 Nonrheumatic aortic (valve) insufficiency: Secondary | ICD-10-CM

## 2019-01-10 DIAGNOSIS — I10 Essential (primary) hypertension: Secondary | ICD-10-CM | POA: Diagnosis not present

## 2019-01-10 LAB — ACID FAST SMEAR (AFB, MYCOBACTERIA): Acid Fast Smear: NEGATIVE

## 2019-01-10 LAB — CYTOLOGY - NON PAP

## 2019-01-10 NOTE — Progress Notes (Signed)
Cardiology Office Note:    Date:  01/10/2019   ID:  Caitlin Lin, DOB 18-Feb-1941, MRN [id number]  PCP:  Einar Pheasant, MD  Cardiologist:  Kate Sable, MD  Electrophysiologist:  None   Referring MD: Einar Pheasant, MD   Chief Complaint  Patient presents with  . office visit    6 week F/U after echo; Meds reviewed verbally with patient.    History of Present Illness:    Caitlin Lin is a 78 y.o. female with a hx of hypertension, hyperlipidemia, aortic regurgitation, mitral regurgitation who presents for follow-up for her echocardiogram results.   She was originally seen about 2 years ago by Dr. Yvone Neu.  At that time an echocardiogram did reveal mild aortic regurgitation, and mild mitral regurgitation.  Ejection fraction was normal.   Repeat echocardiogram was ordered.  She has since been doing okay, denies chest pain, shortness of breath, nausea, vomiting.  Past Medical History:  Diagnosis Date  . Cataracts, bilateral   . Chronic kidney disease    Followed by Dr. Holley Raring  . Chronic kidney disease   . Colon polyp   . Constipation due to slow transit   . Diarrhea   . Diarrhea   . GERD (gastroesophageal reflux disease)   . Heart murmur   . History of cyst of breast   . History of hiatal hernia   . Hyperlipidemia   . Hypertension   . Hypothyroidism   . Melanoma of lower leg ([place]) 2013  . Shingles 2013  . Shingles   . Spinal stenosis in cervical region    Dr. Sharlet Salina  . Subdural hematoma ([place])   . Thyroid disease     Past Surgical History:  Procedure Laterality Date  . ABDOMINAL HYSTERECTOMY  1974  . ANTERIOR AND POSTERIOR VAGINAL REPAIR    . ANTERIOR AND POSTERIOR VAGINAL REPAIR W/ SACROSPINOUS LIGAMENT SUSPENSION Right   . BREAST BIOPSY  1963   Benign per pt's report  . COLONOSCOPY    . ESOPHAGOGASTRODUODENOSCOPY    . ESOPHAGOGASTRODUODENOSCOPY (EGD) WITH PROPOFOL N/A 11/10/2016   Procedure: ESOPHAGOGASTRODUODENOSCOPY (EGD) WITH PROPOFOL;  Surgeon:  Lollie Sails, MD;  Location: [facility] ENDOSCOPY;  Service: Endoscopy;  Laterality: N/A;  . ESOPHAGOGASTRODUODENOSCOPY (EGD) WITH PROPOFOL N/A 09/09/2018   Procedure: ESOPHAGOGASTRODUODENOSCOPY (EGD) WITH PROPOFOL;  Surgeon: Lollie Sails, MD;  Location: [facility] ENDOSCOPY;  Service: Endoscopy;  Laterality: N/A;  . LUNG BIOPSY  2007   Benign per pt's report  . LUNG BIOPSY Right   . OOPHORECTOMY  1970  . PERINEOPLASTY  12/17/2013  . sling for stress incontinence  12/18/2011    Current Medications: Current Meds  Medication Sig  . amLODipine (NORVASC) 2.5 MG tablet Take 1 tablet by mouth once daily  . aspirin 81 MG tablet Take 1 tablet (81 mg total) by mouth daily. (Patient taking differently: Take 81 mg by mouth at bedtime. )  . atorvastatin (LIPITOR) 20 MG tablet TAKE 1 TABLET BY MOUTH ONCE DAILY (Patient taking differently: Take 20 mg by mouth at bedtime. )  . calcium carbonate (OSCAL) 1500 (600 Ca) MG TABS tablet Take 600 mg by mouth daily with breakfast.  . Cholecalciferol 25 MCG (1000 UT) capsule Take 2,000 Units by mouth daily.   . Cranberry 400 MG TABS Take 400 mg by mouth daily.  Marland Kitchen docusate sodium (COLACE) 100 MG capsule Take 100 mg by mouth at bedtime.  . fexofenadine (ALLEGRA) 180 MG tablet Take 180 mg by mouth daily as needed for allergies or  rhinitis.  Marland Kitchen glucosamine-chondroitin 500-400 MG tablet Take 1 tablet by mouth 3 (three) times daily. (Patient taking differently: Take 2 tablets by mouth daily. )  . levothyroxine (SYNTHROID) 75 MCG tablet Take 75 mcg by mouth daily before breakfast.  . losartan (COZAAR) 50 MG tablet Take 1 tablet by mouth once daily  . magnesium oxide (MAG-OX) 400 MG tablet Take 400 mg by mouth daily.  . metoCLOPramide (REGLAN) 5 MG tablet Take 5 mg by mouth 2 (two) times daily.  . Multiple Vitamin (MULTIVITAMIN) tablet Take 1 tablet by mouth daily.  . Omega-3 Fatty Acids (FISH OIL) 1000 MG CAPS Take 1,000 mg by mouth daily.  . pantoprazole (PROTONIX) 40  MG tablet Take 40 mg by mouth daily.  . Probiotic Product (PROBIOTIC DAILY PO) Take by mouth daily.     Allergies:   Quinapril and Codeine   Social History   Socioeconomic History  . Marital status: Married    Spouse name: Not on file  . Number of children: 2  . Years of education: Not on file  . Highest education level: Not on file  Occupational History  . Occupation: Retired  Scientific laboratory technician  . Financial resource strain: Not hard at all  . Food insecurity    Worry: Never true    Inability: Never true  . Transportation needs    Medical: No    Non-medical: No  Tobacco Use  . Smoking status: Never Smoker  . Smokeless tobacco: Never Used  Substance and Sexual Activity  . Alcohol use: No  . Drug use: No  . Sexual activity: Yes  Lifestyle  . Physical activity    Days per week: 0 days    Minutes per session: 0 min  . Stress: Not at all  Relationships  . Social Herbalist on phone: Three times a week    Gets together: Once a week    Attends religious service: More than 4 times per year    Active member of club or organization: Yes    Attends meetings of clubs or organizations: More than 4 times per year    Relationship status: Married  Other Topics Concern  . Not on file  Social History Narrative   Lives in [place] with husband. Has 2 children boy and girl, 4 grandchildren. Retired Presenter, broadcasting.       Daily Caffeine Use:  NO   Regular Exercise -  Not at this time due to back pain, would like to resume soon     Family History: The patient's family history includes Arthritis in her mother; Breast cancer in her cousin and another family member; Cirrhosis in her mother; Coronary artery disease in her father; Depression in her brother; Diabetes in her maternal aunt; Heart attack in her father; Heart disease in her father; Liver cancer in her cousin and mother; Liver disease in her mother.  ROS:   Please see the history of present illness.     All other systems  reviewed and are negative.  EKGs/Labs/Other Studies Reviewed:    The following studies were reviewed today: Echo 01/05/2019  1. Left ventricular ejection fraction, by visual estimation, is 60 to 65%. The left ventricle has normal function. Normal left ventricular size. There is no left ventricular hypertrophy.  2. Global right ventricle has normal systolic function.The right ventricular size is normal. No increase in right ventricular wall thickness.  3. Left atrial size was normal.  4. There is mild dilatation of the ascending  aorta measuring 37 mm.  5. Mildly elevated pulmonary artery systolic pressure.  6. Left ventricular diastolic Doppler parameters are consistent with impaired relaxation pattern of LV diastolic filling. Left Atrium: Left atrial size was normal in size.  Right Atrium: Right atrial size was normal in size  Pericardium: There is no evidence of pericardial effusion.  Mitral Valve: The mitral valve is normal in structure. No evidence of mitral valve stenosis by observation. No evidence of mitral valve regurgitation.  Tricuspid Valve: The tricuspid valve is normal in structure. Tricuspid valve regurgitation is mild by color flow Doppler.  Aortic Valve: The aortic valve was not well visualized. Aortic valve regurgitation is mild by color flow Doppler. Aortic regurgitation PHT measures 501 msec. The aortic valve is structurally normal, with no evidence of sclerosis or stenosis. Aortic valve  mean gradient measures 4.0 mmHg. Aortic valve peak gradient measures 7.5 mmHg. Aortic valve area, by VTI measures 2.06 cm.  Pulmonic Valve: The pulmonic valve was normal in structure. Pulmonic valve regurgitation is not visualized by color flow Doppler.  Aorta: The aortic root, ascending aorta and aortic arch are all structurally normal, with no evidence of dilitation or obstruction. There is mild dilatation of the ascending aorta measuring 37 mm.  Echo 06/16/2016 Study  Conclusions  - Left ventricle: The cavity size was normal. Systolic function was   normal. The estimated ejection fraction was in the range of 60%   to 65%. Wall motion was normal; there were no regional wall   motion abnormalities. Doppler parameters are consistent with   abnormal left ventricular relaxation (grade 1 diastolic   dysfunction). - Aortic valve: There was mild regurgitation. - Mitral valve: Calcified annulus. There was mild regurgitation. - Left atrium: The atrium was normal in size. - Right ventricle: Systolic function was normal. - Pulmonary arteries: Systolic pressure was within the normal   range.  EKG:  EKG is  ordered today.  The ekg ordered today demonstrates normal sinus rhythm, low voltage in precordial leads, occasional PACs.    Recent Labs: 07/22/2018: TSH 3.55 12/07/2018: ALT 15; Platelets 192.0 01/09/2019: BUN 28; Creatinine, Ser 1.70; Hemoglobin 13.3; Potassium 4.0; Sodium 142  Recent Lipid Panel    Component Value Date/Time   CHOL 153 12/07/2018 1044   TRIG 197.0 (H) 12/07/2018 1044   HDL 38.80 (L) 12/07/2018 1044   CHOLHDL 4 12/07/2018 1044   VLDL 39.4 12/07/2018 1044   LDLCALC 74 12/07/2018 1044   LDLDIRECT 84.0 07/22/2018 0839    Physical Exam:    VS:  BP 140/80 (BP Location: Left Arm, Patient Position: Sitting, Cuff Size: Normal)   Pulse 70   Temp (!) 97.3 F (36.3 C)   Ht 5\' 4"  (1.626 m)   Wt 161 lb 8 oz (73.3 kg)   SpO2 97%   BMI 27.72 kg/m     Wt Readings from Last 3 Encounters:  01/10/19 161 lb 8 oz (73.3 kg)  01/04/19 161 lb 9.6 oz (73.3 kg)  01/03/19 162 lb (73.5 kg)     GEN:  Well nourished, well developed in no acute distress HEENT: Normal NECK: No JVD; No carotid bruits LYMPHATICS: No lymphadenopathy CARDIAC: RRR, 1/6 systolic murmur right upper sternal border.  No rubs, gallops RESPIRATORY:  Clear to auscultation without rales, wheezing or rhonchi  ABDOMEN: Soft, non-tender, non-distended MUSCULOSKELETAL:  No edema;  varicose veins noted in lower extremity. SKIN: Warm and dry NEUROLOGIC:  Alert and oriented x 3 PSYCHIATRIC:  Normal affect   ASSESSMENT:  Echocardiogram shows normal ejection fraction, mild aortic regurgitation.  Blood Pressure reasonably controlled.  Her blood pressure usually in the 676P systolic at home.  Mild aortic regurgitation hypertension PLAN:    Repeat echocardiogram in about 18 months to 1year Pressures reasonably controlled, continue current blood pressure medications.  Total encounter time more than 25 minutes  Greater than 50% was spent in counseling and coordination of care with the patient   Medication Adjustments/Labs and Tests Ordered: Current medicines are reviewed at length with the patient today.  Concerns regarding medicines are outlined above.  Orders Placed This Encounter  Procedures  . EKG 12-Lead  . ECHOCARDIOGRAM COMPLETE   No orders of the defined types were placed in this encounter.   Patient Instructions  Medication Instructions:  - Your physician recommends that you continue on your current medications as directed. Please refer to the Current Medication list given to you today.  *If you need a refill on your cardiac medications before your next appointment, please call your pharmacy*  Lab Work: - none ordered  If you have labs (blood work) drawn today and your tests are completely normal, you will receive your results only by: Marland Kitchen MyChart Message (if you have MyChart) OR . A paper copy in the mail If you have any lab test that is abnormal or we need to change your treatment, we will call you to review the results.  Testing/Procedures: - Your physician has requested that you have an echocardiogram in 18 months (just prior to your follow up with Dr. Garen Lah). Echocardiography is a painless test that uses sound waves to create images of your heart. It provides your doctor with information about the size and shape of your heart and how well  your heart's chambers and valves are working. This procedure takes approximately one hour. There are no restrictions for this procedure.  Follow-Up: At [facility], you and your health needs are our priority.  As part of our continuing mission to provide you with exceptional heart care, we have created designated Provider Care Teams.  These Care Teams include your primary Cardiologist (physician) and Advanced Practice Providers (APPs -  Physician Assistants and Nurse Practitioners) who all work together to provide you with the care you need, when you need it.  Your next appointment:   18 months  The format for your next appointment:   In Person  Provider:    You may see Kate Sable, MD or one of the following Advanced Practice Providers on your designated Care Team:    Murray Hodgkins, NP  Christell Faith, PA-C  Marrianne Mood, PA-C   Other Instructions - n/a     Signed, Kate Sable, MD  01/10/2019 10:25 AM    [place]

## 2019-01-10 NOTE — Patient Instructions (Signed)
Medication Instructions:  - Your physician recommends that you continue on your current medications as directed. Please refer to the Current Medication list given to you today.  *If you need a refill on your cardiac medications before your next appointment, please call your pharmacy*  Lab Work: - none ordered  If you have labs (blood work) drawn today and your tests are completely normal, you will receive your results only by: Marland Kitchen MyChart Message (if you have MyChart) OR . A paper copy in the mail If you have any lab test that is abnormal or we need to change your treatment, we will call you to review the results.  Testing/Procedures: - Your physician has requested that you have an echocardiogram in 18 months (just prior to your follow up with Dr. Garen Lah). Echocardiography is a painless test that uses sound waves to create images of your heart. It provides your doctor with information about the size and shape of your heart and how well your heart's chambers and valves are working. This procedure takes approximately one hour. There are no restrictions for this procedure.  Follow-Up: At [facility], you and your health needs are our priority.  As part of our continuing mission to provide you with exceptional heart care, we have created designated Provider Care Teams.  These Care Teams include your primary Cardiologist (physician) and Advanced Practice Providers (APPs -  Physician Assistants and Nurse Practitioners) who all work together to provide you with the care you need, when you need it.  Your next appointment:   18 months  The format for your next appointment:   In Person  Provider:    You may see Kate Sable, MD or one of the following Advanced Practice Providers on your designated Care Team:    Murray Hodgkins, NP  Christell Faith, PA-C  Marrianne Mood, PA-C   Other Instructions - n/a

## 2019-01-10 NOTE — Anesthesia Postprocedure Evaluation (Signed)
Anesthesia Post Note  Patient: Caitlin Lin  Procedure(s) Performed: ELECTROMAGNETIC NAVIGATION BRONCHOSCOPY (Right )  Patient location during evaluation: PACU Anesthesia Type: General Level of consciousness: awake and alert Pain management: pain level controlled Vital Signs Assessment: post-procedure vital signs reviewed and stable Respiratory status: spontaneous breathing, nonlabored ventilation and respiratory function stable Cardiovascular status: blood pressure returned to baseline and stable Postop Assessment: no apparent nausea or vomiting Anesthetic complications: no     Last Vitals:  Vitals:   01/09/19 1450 01/09/19 1459  BP: (!) 155/74 (!) 155/78  Pulse: 70 74  Resp: 18 18  Temp:  37.1 C  SpO2: 98% 98%    Last Pain:  Vitals:   01/10/19 0837  TempSrc:   PainSc: 0-No pain                 Durenda Hurt

## 2019-01-11 ENCOUNTER — Encounter: Payer: Self-pay | Admitting: Pulmonary Disease

## 2019-01-11 ENCOUNTER — Telehealth: Payer: Self-pay

## 2019-01-11 LAB — SURGICAL PATHOLOGY

## 2019-01-11 NOTE — Telephone Encounter (Signed)
Spoke with patient to notify her that per Dr.Oaks he would like for patient to come in for an appointment to discuss lung biopsy results. Patient is scheduled for 01/13/19 at 8:00am. Patient verbalizes understanding.

## 2019-01-11 NOTE — Telephone Encounter (Signed)
-----   Message from Nestor Lewandowsky, MD sent at 01/11/2019 10:29 AM EDT ----- May I please see this patient in the office to review lung biopsy results

## 2019-01-12 LAB — CULTURE, BAL-QUANTITATIVE W GRAM STAIN
Culture: NO GROWTH
Gram Stain: NONE SEEN

## 2019-01-13 ENCOUNTER — Other Ambulatory Visit: Payer: Self-pay

## 2019-01-13 ENCOUNTER — Encounter: Payer: Self-pay | Admitting: Cardiothoracic Surgery

## 2019-01-13 ENCOUNTER — Ambulatory Visit (INDEPENDENT_AMBULATORY_CARE_PROVIDER_SITE_OTHER): Payer: PPO | Admitting: Cardiothoracic Surgery

## 2019-01-13 VITALS — BP 131/80 | HR 78 | Temp 97.5°F | Ht 64.0 in | Wt 161.0 lb

## 2019-01-13 DIAGNOSIS — R918 Other nonspecific abnormal finding of lung field: Secondary | ICD-10-CM | POA: Diagnosis not present

## 2019-01-13 NOTE — Patient Instructions (Signed)
Follow up with Dr Patsey Berthold.  Follow up with [place] in 3 months with a non contrast CT of the lung. We will notify you about this.

## 2019-01-13 NOTE — Progress Notes (Signed)
  Patient ID: Caitlin Lin, female   DOB: 31-Jan-1941, 78 y.o.   MRN: [id number]  HISTORY: She returns today in follow-up.  She underwent her bronchoscopy last week.  She has had a little bit of a cough and some hoarseness but that is improving.   Vitals:   01/13/19 0802  BP: 131/80  Pulse: 78  Temp: (!) 97.5 F (36.4 C)  SpO2: 94%     EXAM:    Resp: Lungs are clear bilaterally.  No respiratory distress, normal effort. Heart:  Regular without murmurs Abd:  Abdomen is soft, non distended and non tender. No masses are palpable.  There is no rebound and no guarding.  Neurological: Alert and oriented to person, place, and time. Coordination normal.  Skin: Skin is warm and dry. No rash noted. No diaphoretic. No erythema. No pallor.  Psychiatric: Normal mood and affect. Normal behavior. Judgment and thought content normal.    ASSESSMENT: I have independently reviewed the patient's chest x-rays again as well as her CT scan and PET scan.  I reviewed the results of the bronchoscopy and brushings which reveal evidence of inflammation but no evidence of malignancy.   PLAN:   I explained to the patient that I thought that this was most likely an inflammatory or infectious process.  She is scheduled to see Dr. Patsey Berthold next week and I will bring her back in 3 months with an uninfused chest CT.  She understands that the only way to be absolutely certain that this is a benign process is for right upper lobectomy but I believe that the evidence of point to this being an inflammatory process overall.    Nestor Lewandowsky, MD

## 2019-01-18 ENCOUNTER — Ambulatory Visit: Payer: PPO | Admitting: Pulmonary Disease

## 2019-01-18 ENCOUNTER — Other Ambulatory Visit: Payer: Self-pay

## 2019-01-18 ENCOUNTER — Encounter: Payer: Self-pay | Admitting: Pulmonary Disease

## 2019-01-18 VITALS — BP 126/82 | HR 66 | Temp 98.4°F | Ht 64.0 in | Wt 161.2 lb

## 2019-01-18 DIAGNOSIS — Z9889 Other specified postprocedural states: Secondary | ICD-10-CM

## 2019-01-18 DIAGNOSIS — R918 Other nonspecific abnormal finding of lung field: Secondary | ICD-10-CM

## 2019-01-18 DIAGNOSIS — Z85820 Personal history of malignant melanoma of skin: Secondary | ICD-10-CM | POA: Diagnosis not present

## 2019-01-18 NOTE — Progress Notes (Signed)
Subjective:    Patient ID: Caitlin Lin, female    DOB: 1940/06/10, 78 y.o.   MRN: [id number]  HPI Patient is a 78 year old lifelong never smoker who presents after navigational bronchoscopy performed on 09 January 2019.  Recall that she had nodular lesions noted on the LEFT and RIGHT upper lobes.  These were incidental findings noted in May 2020.  Subsequent dedicated CT chest without contrast on 02 September 2018 showed the lesions.  These were more consistent with atypical infection.  PET/CT performed July 1 showed branching lesions and moderate FDG uptake.  I repeat CT scan in 3 months showed that the RIGHT upper lobe cluster of nodules had increased in size and had coalesced measuring 2.1 x 2.7 cm.  There were also postoperative changes on the right lower lobe from prior benign lesion excision.  The LEFT upper lobe nodular consolidation remained stable.  As noted the patient underwent navigational bronchoscopy.  Biopsies and findings consistent with inflammation at present.  Cultures are still pending particularly AFB cultures.  This may represent atypical mycobacterial infection.  Continues to have no complaints of fevers, chills or sweats.  No chest pain.  No sputum production or hemoptysis.  She looks well and feels well.   Review of Systems A 10 point review of systems was performed and it is as noted above otherwise negative.    Objective:   Physical Exam Vitals signs and nursing note reviewed.  Constitutional:      General: She is not in acute distress.    Appearance: Normal appearance.  HENT:     Head: Normocephalic and atraumatic.     Right Ear: External ear normal.     Left Ear: External ear normal.     Nose:     Comments: Nose/mouth/throat not examined due to masking requirements for COVID 19. Eyes:     General: No scleral icterus.    Pupils: Pupils are equal, round, and reactive to light.  Neck:     Musculoskeletal: Neck supple.     Thyroid: No thyromegaly.     Trachea:  Trachea and phonation normal.  Cardiovascular:     Rate and Rhythm: Normal rate and regular rhythm.     Pulses: Normal pulses.     Heart sounds: Normal heart sounds. No murmur.  Pulmonary:     Effort: Pulmonary effort is normal.     Breath sounds: Normal breath sounds.  Abdominal:     General: There is no distension.  Musculoskeletal: Normal range of motion.     Right lower leg: No edema.     Left lower leg: No edema.  Lymphadenopathy:     Cervical: No cervical adenopathy.  Skin:    General: Skin is warm and dry.  Neurological:     General: No focal deficit present.     Mental Status: She is alert and oriented to person, place, and time.  Psychiatric:        Mood and Affect: Mood normal.        Behavior: Behavior normal.        Assessment & Plan:   1.  Lung nodules: Possible atypical infection particularly atypical mycobacterial infection.  Patient is currently asymptomatic.  Continue close observation.  We will see the patient in follow-up in 3 to 4 weeks time.  She is to contact us prior to that time should any new difficulties arise.  2.  History of melanoma excision in the past: This issue adds complexity to her management.  This chart was dictated using voice recognition software/Dragon.  Despite best efforts to proofread, errors can occur which can change the meaning.  Any change was purely unintentional.    C. Derrill Kay, MD [facility]

## 2019-01-18 NOTE — Patient Instructions (Signed)
We will see her in follow-up in 3 to 4 weeks time call sooner should any new difficulties arise.

## 2019-02-06 ENCOUNTER — Telehealth: Payer: Self-pay | Admitting: Internal Medicine

## 2019-02-06 DIAGNOSIS — N184 Chronic kidney disease, stage 4 (severe): Secondary | ICD-10-CM

## 2019-02-06 NOTE — Telephone Encounter (Signed)
Copied from [place] [id number]. Topic: General - Other >> Feb 06, 2019 11:40 AM Keene Breath wrote: Reason for CRM: Patient called to inform the doctor that she would like the referral to a kidney specialist that the discussed earlier.  Please advise and let patient know when it has been approved.  CB# [phone]

## 2019-02-07 NOTE — Telephone Encounter (Signed)
Patient returning call.

## 2019-02-07 NOTE — Telephone Encounter (Signed)
LMTCB

## 2019-02-10 NOTE — Telephone Encounter (Signed)
Pt called again to speak with [place].  Pt can be reached at [phone]

## 2019-02-13 NOTE — Telephone Encounter (Signed)
I had discussed with pt regarding her persistent pelvic pain and discussed referral to gyn.  She sees nephrology for her kidney function.  Please clarify exactly what the referral if to address.  See me before calling pt.

## 2019-02-13 NOTE — Telephone Encounter (Signed)
Patient said that PCP told her at last visit that she would send her to kidney specialist patient says she is willing to go now nothing has changed just still for the bladder and kidney issue PCP has been treating for.

## 2019-02-14 NOTE — Telephone Encounter (Signed)
Patient returning call from Judson Roch.  please advise.  Call back number [phone]

## 2019-02-14 NOTE — Telephone Encounter (Signed)
LMTCB to see where patient is wanted to be referred.

## 2019-02-15 ENCOUNTER — Ambulatory Visit (INDEPENDENT_AMBULATORY_CARE_PROVIDER_SITE_OTHER): Payer: PPO | Admitting: Pulmonary Disease

## 2019-02-15 ENCOUNTER — Encounter: Payer: Self-pay | Admitting: Pulmonary Disease

## 2019-02-15 ENCOUNTER — Other Ambulatory Visit: Payer: Self-pay

## 2019-02-15 VITALS — BP 132/84 | HR 100 | Temp 97.2°F | Ht 64.0 in | Wt 160.4 lb

## 2019-02-15 DIAGNOSIS — K449 Diaphragmatic hernia without obstruction or gangrene: Secondary | ICD-10-CM | POA: Diagnosis not present

## 2019-02-15 DIAGNOSIS — R918 Other nonspecific abnormal finding of lung field: Secondary | ICD-10-CM | POA: Diagnosis not present

## 2019-02-15 DIAGNOSIS — Z85820 Personal history of malignant melanoma of skin: Secondary | ICD-10-CM | POA: Diagnosis not present

## 2019-02-15 DIAGNOSIS — K219 Gastro-esophageal reflux disease without esophagitis: Secondary | ICD-10-CM | POA: Diagnosis not present

## 2019-02-15 DIAGNOSIS — R911 Solitary pulmonary nodule: Secondary | ICD-10-CM | POA: Diagnosis not present

## 2019-02-15 DIAGNOSIS — J209 Acute bronchitis, unspecified: Secondary | ICD-10-CM

## 2019-02-15 DIAGNOSIS — Z9889 Other specified postprocedural states: Secondary | ICD-10-CM | POA: Diagnosis not present

## 2019-02-15 MED ORDER — CLARITHROMYCIN 500 MG PO TABS: 500.0000 mg | ORAL_TABLET | Freq: Two times a day (BID) | ORAL | 0 refills | Status: AC

## 2019-02-15 NOTE — Telephone Encounter (Signed)
LM once again for patient to call back.

## 2019-02-15 NOTE — Telephone Encounter (Signed)
Pt has been notified that referral has been placed.

## 2019-02-15 NOTE — Patient Instructions (Addendum)
1.  We will give you Biaxin 500 mg twice a day for 10 days.  2.  We will repeat chest CT after that is done.  3.  We will see her in follow-up after the CT is done.  4. Hold Lipitor while on Biaxin.

## 2019-02-15 NOTE — Telephone Encounter (Signed)
Please call and notify pt that I have placed the order for referral to [facility] nephrology.  Someone should be contacting her with an appt date and time.

## 2019-02-15 NOTE — Telephone Encounter (Signed)
Pt states sorry been playing phone tag, pls reach back out on this referral issue to pt.

## 2019-02-15 NOTE — Progress Notes (Addendum)
Subjective:    Patient ID: Caitlin Lin, female    DOB: 04-Mar-1941, 78 y.o.   MRN: [id number]  HPI 78 year old lifelong never smoker with nodule clusters on the LEFT and RIGHT upper lobes.  Underwent navigational bronchoscopy on 09 January 2019.  Biopsy was negative for malignancy positive for inflammation query MAI, cultures have been negative so far.  Findings on CT are consistent with atypical infection.  She underwent navigational bronchoscopy due to the RIGHT upper lobe cluster of nodules had increased in size.  Of note the patient had had prior excision in the remote past of benign lesion in the right lower lobe.  Nodules on the LEFT have remained stable.  Over the last 2 days the patient has felt some malaise and has had yellow sputum production.  No fevers or chills.  No hemoptysis.  She feels that this may have started from a sinus infection which she is prone to.  No other constitutional symptoms.  No GI symptoms.  No dyspnea.   Review of Systems A 10 point review of systems was performed and it is as noted above otherwise negative.    Objective:   Physical Exam Vitals and nursing note reviewed.  Constitutional:      General: She is not in acute distress.    Appearance: Normal appearance.  HENT:     Head: Normocephalic and atraumatic.     Right Ear: External ear normal.     Left Ear: External ear normal.     Nose:     Comments: Nose/mouth/throat not examined due to masking requirements for COVID 19. Eyes:     General: No scleral icterus.    Pupils: Pupils are equal, round, and reactive to light.  Neck:     Thyroid: No thyromegaly.     Trachea: Trachea and phonation normal.  Cardiovascular:     Rate and Rhythm: Normal rate and regular rhythm.     Pulses: Normal pulses.     Heart sounds: Normal heart sounds. No murmur.  Pulmonary:     Effort: Pulmonary effort is normal.     Breath sounds: No wheezing, rhonchi or rales.     Comments: Coarse breath sounds Abdominal:      General: There is no distension.  Musculoskeletal:        General: Normal range of motion.     Cervical back: Neck supple.     Right lower leg: No edema.     Left lower leg: No edema.  Lymphadenopathy:     Cervical: No cervical adenopathy.  Skin:    General: Skin is warm and dry.  Neurological:     General: No focal deficit present.     Mental Status: She is alert and oriented to person, place, and time.  Psychiatric:        Mood and Affect: Mood normal.        Behavior: Behavior normal.    BP 132/84 (BP Location: Left Arm, Patient Position: Sitting, Cuff Size: Normal)   Pulse 100   Temp (!) 97.2 F (36.2 C) (Temporal)   Ht 5\' 4"  (1.626 m)   Wt 160 lb 6.4 oz (72.8 kg)   SpO2 97% Comment: RA  BMI 27.53 kg/m        Assessment & Plan:  Lung nodules Likely due to MAI infection Bronchoscopy 01/09/2019, negative Continue surveillance Reimage after Biaxin therapy below  Acute bronchitis, organism unspecified Biaxin 500 mg twice daily x10 days  Gastroesophageal reflux disease with hiatal  hernia This issue adds complexity to her management It could be an additional etiology for her lung nodules Aspiration bronchiolitis a possible etiology for lung nodules Continue PPI  History of melanoma excision in the past This issue adds complexity to her management vis--vis lung nodules  We will see the patient in follow-up after her Biaxin therapy and repeat CT scan.  She is to contact us prior to that time should any new difficulties arise or symptoms worsen.  Renold Don, MD [facility]   This note was dictated using voice recognition software/Dragon.  Despite best efforts to proofread, errors can occur which can change the meaning.  Any change was purely unintentional.

## 2019-02-16 ENCOUNTER — Other Ambulatory Visit: Payer: Self-pay | Admitting: Internal Medicine

## 2019-02-22 ENCOUNTER — Telehealth: Payer: Self-pay

## 2019-02-22 LAB — ACID FAST CULTURE WITH REFLEXED SENSITIVITIES (MYCOBACTERIA): Acid Fast Culture: NEGATIVE

## 2019-02-22 NOTE — Telephone Encounter (Signed)
Per Dr.Oaks would like patient to schedule a virtual phone visit with him any time after her scheduled CT chest scan on 03/01/19 with Dr.Gonzalez. Left message with patient's husband to give our office a call back at her earliest convenience.

## 2019-02-24 ENCOUNTER — Telehealth: Payer: Self-pay | Admitting: *Deleted

## 2019-02-24 NOTE — Telephone Encounter (Signed)
Pt is following up on nephrology referral.

## 2019-02-24 NOTE — Telephone Encounter (Signed)
Copied from [place] [id number]. Topic: Referral - Status >> Feb 24, 2019 10:00 AM Burchel, Abbi R wrote: Reason for CRM: Pt states she was instructed by Dr Nicki Reaper to call the office if she had not heard from Nephrology referral with in 1 wk. Pt states she has not been contacted. Please advise.

## 2019-02-27 NOTE — Telephone Encounter (Signed)
Referral is in process. It was submitted to [facility] in [place] on 12/9 through [facility]

## 2019-03-01 ENCOUNTER — Ambulatory Visit
Admission: RE | Admit: 2019-03-01 | Discharge: 2019-03-01 | Disposition: A | Discharge status: Home / Self Care | Payer: PPO | Source: Ambulatory Visit | Attending: Pulmonary Disease | Admitting: Pulmonary Disease

## 2019-03-01 ENCOUNTER — Other Ambulatory Visit: Payer: Self-pay

## 2019-03-01 DIAGNOSIS — R911 Solitary pulmonary nodule: Secondary | ICD-10-CM

## 2019-03-01 DIAGNOSIS — R918 Other nonspecific abnormal finding of lung field: Secondary | ICD-10-CM | POA: Diagnosis not present

## 2019-03-03 ENCOUNTER — Other Ambulatory Visit: Payer: Self-pay

## 2019-03-03 ENCOUNTER — Telehealth: Payer: Self-pay

## 2019-03-03 ENCOUNTER — Telehealth (INDEPENDENT_AMBULATORY_CARE_PROVIDER_SITE_OTHER): Payer: PPO | Admitting: Cardiothoracic Surgery

## 2019-03-03 DIAGNOSIS — Z0000 Encounter for general adult medical examination without abnormal findings: Secondary | ICD-10-CM | POA: Diagnosis not present

## 2019-03-03 NOTE — Telephone Encounter (Signed)
Left message on both phone to call office and to apologize for virtual visit not being done. Will need to arrange another appointment to discuss CT results.

## 2019-03-03 NOTE — Progress Notes (Signed)
I reviewed with the patient the results of her CT scan by way of virtual visit by telephone.  The CT scan findings suggest a an inflammatory process in the lungs.  The patient is scheduled to see Dr. Patsey Berthold later this month and she will continue to provide her with pulmonary medicine care.  Right now the patient is essentially asymptomatic and I encouraged her to continue her follow-up with Dr. Patsey Berthold.

## 2019-03-13 ENCOUNTER — Ambulatory Visit: Payer: PPO | Admitting: Pulmonary Disease

## 2019-03-15 ENCOUNTER — Encounter: Payer: Self-pay | Admitting: Pulmonary Disease

## 2019-03-15 ENCOUNTER — Ambulatory Visit (INDEPENDENT_AMBULATORY_CARE_PROVIDER_SITE_OTHER): Payer: PPO | Admitting: Pulmonary Disease

## 2019-03-15 ENCOUNTER — Other Ambulatory Visit: Payer: Self-pay

## 2019-03-15 VITALS — BP 128/76 | HR 74 | Temp 97.8°F | Ht 64.0 in | Wt 162.8 lb

## 2019-03-15 DIAGNOSIS — R059 Cough, unspecified: Secondary | ICD-10-CM

## 2019-03-15 DIAGNOSIS — R918 Other nonspecific abnormal finding of lung field: Secondary | ICD-10-CM

## 2019-03-15 DIAGNOSIS — R05 Cough: Secondary | ICD-10-CM

## 2019-03-15 MED ORDER — ARNUITY ELLIPTA 100 MCG/ACT IN AEPB: 1.0000 | INHALATION_SPRAY | Freq: Every day | RESPIRATORY_TRACT | 3 refills | Status: DC

## 2019-03-15 NOTE — Progress Notes (Signed)
Subjective:    Patient ID: Caitlin Lin, female    DOB: [birth year], 78 y.o.   MRN: [id number]  HPI Caitlin Lin is a 78 year old life long never smoker, with nodular clusters on the LEFT and RIGHT upper lobes.  She had a negative navigational bronchoscopy on 26 October.  Her last visit here was on 2 December at that time she was treated with Biaxin for an acute bronchitis and sinus infection.  She states that at that time she felt markedly better but over the last few days has felt tired and fatigued.  She is not sure if part of it is due to increased activities over the holidays.  She has had no fevers, chills or sweats.  No anosmia or dysgeusia, no GI symptoms.  No shortness of breath.  She has had a dry cough since the beginning of winter switching from ACE to heat in the home.  Does not note any wheezing.  She has had occasional epistaxis from dry nasal passages.  She has started using a humidifier which helps some.  She had CT chest on 16 December.  Findings were discussed and reviewed with the patient.  These were independently reviewed.   Review of Systems A 10 point review of systems was performed and it is as noted above otherwise negative.    Objective:   Physical Exam Vitals and nursing note reviewed.  Constitutional:      General: She is not in acute distress.    Appearance: Normal appearance.  HENT:     Head: Normocephalic and atraumatic.     Right Ear: External ear normal.     Left Ear: External ear normal.     Nose:     Comments: Nose/mouth/throat not examined due to masking requirements for COVID 19. Eyes:     General: No scleral icterus.    Pupils: Pupils are equal, round, and reactive to light.  Neck:     Thyroid: No thyromegaly.     Trachea: Trachea and phonation normal.  Cardiovascular:     Rate and Rhythm: Normal rate and regular rhythm.     Pulses: Normal pulses.     Heart sounds: Normal heart sounds. No murmur.  Pulmonary:     Effort: Pulmonary effort is  normal.     Breath sounds: Normal breath sounds.  Abdominal:     General: There is no distension.  Musculoskeletal:        General: Normal range of motion.     Cervical back: Neck supple.     Right lower leg: No edema.     Left lower leg: No edema.  Lymphadenopathy:     Cervical: No cervical adenopathy.  Skin:    General: Skin is warm and dry.  Neurological:     General: No focal deficit present.     Mental Status: She is alert and oriented to person, place, and time.  Psychiatric:        Mood and Affect: Mood normal.        Behavior: Behavior normal.    Percentage of images of CT performed 16 December nodule cluster on the right upper lobe and nodule in the left upper lobe as noted below.         Assessment & Plan:  Lung nodules Likely due to MAI infection Bronchoscopy 01/09/2019, negative Continue surveillance We will see her in follow-up in 3 months time Schedule follow-up CT scan at that time for another 3 months (total 6 months from  last CT)  Cough Possibly postinfectious Eosinophilic bronchitis also possibility Trial of Arnuity 100 mcg 1 puff daily  Gastroesophageal reflux disease with hiatal hernia This issue adds complexity to her management It could be an additional etiology for her lung nodules Aspiration bronchiolitis a possible etiology for lung nodules Continue PPI  History of melanoma excision in the past This issue adds complexity to her management vis--vis lung nodules   C. Derrill Kay, MD [facility]  This note was dictated using voice recognition software/Dragon.  Despite best efforts to proofread, errors can occur which can change the meaning.  Any change was purely unintentional.

## 2019-03-15 NOTE — Patient Instructions (Signed)
1.  We will proceed with trying Arnuity Ellipta 1 inhalation daily to see if this helps with the cough.  Please make sure you rinse your mouth well after use it.  Putting a little bit of baking soda in the water you use to rinse with helps a great deal.  2.  We will see you in follow-up in 3 months time.  At that time we will schedule a follow-up CT for another 3 months after that.  Call sooner if any new difficulties arise.

## 2019-03-15 NOTE — Progress Notes (Deleted)
   Subjective:    Patient ID: Caitlin Lin, female    DOB: [birth year], 78 y.o.   MRN: [id number]  HPI    Review of Systems     Objective:   Physical Exam        Assessment & Plan:

## 2019-03-18 ENCOUNTER — Other Ambulatory Visit: Payer: Self-pay | Admitting: Internal Medicine

## 2019-04-10 DIAGNOSIS — M3501 Sicca syndrome with keratoconjunctivitis: Secondary | ICD-10-CM | POA: Diagnosis not present

## 2019-04-18 DIAGNOSIS — N184 Chronic kidney disease, stage 4 (severe): Secondary | ICD-10-CM | POA: Diagnosis not present

## 2019-04-18 DIAGNOSIS — R300 Dysuria: Secondary | ICD-10-CM | POA: Diagnosis not present

## 2019-04-18 DIAGNOSIS — R600 Localized edema: Secondary | ICD-10-CM | POA: Diagnosis not present

## 2019-04-20 ENCOUNTER — Encounter: Payer: Self-pay | Admitting: Internal Medicine

## 2019-04-20 ENCOUNTER — Ambulatory Visit (INDEPENDENT_AMBULATORY_CARE_PROVIDER_SITE_OTHER): Payer: PPO | Admitting: Internal Medicine

## 2019-04-20 ENCOUNTER — Other Ambulatory Visit: Payer: Self-pay

## 2019-04-20 DIAGNOSIS — E7800 Pure hypercholesterolemia, unspecified: Secondary | ICD-10-CM | POA: Diagnosis not present

## 2019-04-20 DIAGNOSIS — K219 Gastro-esophageal reflux disease without esophagitis: Secondary | ICD-10-CM | POA: Diagnosis not present

## 2019-04-20 DIAGNOSIS — G479 Sleep disorder, unspecified: Secondary | ICD-10-CM | POA: Diagnosis not present

## 2019-04-20 DIAGNOSIS — I10 Essential (primary) hypertension: Secondary | ICD-10-CM | POA: Diagnosis not present

## 2019-04-20 DIAGNOSIS — D509 Iron deficiency anemia, unspecified: Secondary | ICD-10-CM

## 2019-04-20 DIAGNOSIS — R1030 Lower abdominal pain, unspecified: Secondary | ICD-10-CM | POA: Diagnosis not present

## 2019-04-20 DIAGNOSIS — E039 Hypothyroidism, unspecified: Secondary | ICD-10-CM

## 2019-04-20 DIAGNOSIS — I700 Atherosclerosis of aorta: Secondary | ICD-10-CM

## 2019-04-20 DIAGNOSIS — K760 Fatty (change of) liver, not elsewhere classified: Secondary | ICD-10-CM | POA: Diagnosis not present

## 2019-04-20 DIAGNOSIS — N184 Chronic kidney disease, stage 4 (severe): Secondary | ICD-10-CM

## 2019-04-20 NOTE — Progress Notes (Signed)
Patient ID: Caitlin Lin, female   DOB: Nov 29, 1940, 79 y.o.   MRN: [id number]   Virtual Visit via video Note  This visit type was conducted due to national recommendations for restrictions regarding the COVID-19 pandemic (e.g. social distancing).  This format is felt to be most appropriate for this patient at this time.  All issues noted in this document were discussed and addressed.  No physical exam was performed (except for noted visual exam findings with Video Visits).   I connected with [name] by a video enabled telemedicine application and verified that I am speaking with the correct person using two identifiers. Location patient: home Location provider: work Persons participating in the virtual visit: patient, provider  The limitations, risks, security and privacy concerns of performing an evaluation and management service by video and the availability of in person appointments have been discussed.  The patient expressed understanding and agreed to proceed.   Reason for visit: scheduled follow up.   HPI: Recently had f/u with Dr Duwayne Heck (03/15/19).  Recommend f/u chest CT 6 months.  Breathing stable.  Also recently saw Dr Percell Miller for f/u CKD.  Creatinine has been stable with GFR around 29.  Has had extensive GI w/up for abdominal pain.  S/p EGD, [place], HIDA.  Has seen GI.  Was placed on reglan and protonix.  Last visit with them, per their report, she was doing better.  She reports persistent abdominal discomfort.  Bowels are moving.  Describes discomfort under her breasts with some pressure extending up in her chest.  (worse if lies on side).  Some abdominal pressure.  Feels as if food stops.  No actual vomiting.  Fills up quicker.  Decreased energy.  Not sleeping as well - some nights.     ROS: See pertinent positives and negatives per HPI.  Past Medical History:  Diagnosis Date  . Cataracts, bilateral   . Chronic kidney disease    Followed by Dr. Holley Raring  . Chronic kidney  disease   . Colon polyp   . Constipation due to slow transit   . Diarrhea   . Diarrhea   . GERD (gastroesophageal reflux disease)   . Heart murmur   . History of cyst of breast   . History of hiatal hernia   . Hyperlipidemia   . Hypertension   . Hypothyroidism   . Melanoma of lower leg ([place]) 2013  . Shingles 2013  . Shingles   . Spinal stenosis in cervical region    Dr. Sharlet Salina  . Subdural hematoma ([place])   . Thyroid disease     Past Surgical History:  Procedure Laterality Date  . ABDOMINAL HYSTERECTOMY  1974  . ANTERIOR AND POSTERIOR VAGINAL REPAIR    . ANTERIOR AND POSTERIOR VAGINAL REPAIR W/ SACROSPINOUS LIGAMENT SUSPENSION Right   . BREAST BIOPSY  1963   Benign per pt's report  . COLONOSCOPY    . ELECTROMAGNETIC NAVIGATION BROCHOSCOPY Right 01/09/2019   Procedure: ELECTROMAGNETIC NAVIGATION BRONCHOSCOPY;  Surgeon: Tyler Pita, MD;  Location: [facility] ORS;  Service: Cardiopulmonary;  Laterality: Right;  . ESOPHAGOGASTRODUODENOSCOPY    . ESOPHAGOGASTRODUODENOSCOPY (EGD) WITH PROPOFOL N/A 11/10/2016   Procedure: ESOPHAGOGASTRODUODENOSCOPY (EGD) WITH PROPOFOL;  Surgeon: Lollie Sails, MD;  Location: [facility] ENDOSCOPY;  Service: Endoscopy;  Laterality: N/A;  . ESOPHAGOGASTRODUODENOSCOPY (EGD) WITH PROPOFOL N/A 09/09/2018   Procedure: ESOPHAGOGASTRODUODENOSCOPY (EGD) WITH PROPOFOL;  Surgeon: Lollie Sails, MD;  Location: [facility] ENDOSCOPY;  Service: Endoscopy;  Laterality: N/A;  . LUNG BIOPSY  2007  Benign per pt's report  . LUNG BIOPSY Right   . OOPHORECTOMY  1970  . PERINEOPLASTY  12/17/2013  . sling for stress incontinence  12/18/2011    Family History  Problem Relation Age of Onset  . Arthritis Mother   . Liver disease Mother   . Cirrhosis Mother   . Liver cancer Mother   . Heart disease Father   . Heart attack Father   . Coronary artery disease Father   . Depression Brother   . Breast cancer Other   . Breast cancer Cousin        maternal cousins  .  Liver cancer Cousin   . Diabetes Maternal Aunt     SOCIAL HX: reviewed.    Current Outpatient Medications:  .  amLODipine (NORVASC) 2.5 MG tablet, Take 1 tablet by mouth once daily, Disp: 90 tablet, Rfl: 0 .  aspirin 81 MG tablet, Take 1 tablet (81 mg total) by mouth daily. (Patient taking differently: Take 81 mg by mouth at bedtime. ), Disp: 30 tablet, Rfl: 3 .  atorvastatin (LIPITOR) 20 MG tablet, Take 1 tablet by mouth once daily, Disp: 90 tablet, Rfl: 0 .  calcium carbonate (OSCAL) 1500 (600 Ca) MG TABS tablet, Take 600 mg by mouth daily with breakfast., Disp: , Rfl:  .  Cholecalciferol 25 MCG (1000 UT) capsule, Take 2,000 Units by mouth daily. , Disp: , Rfl:  .  Cranberry 400 MG TABS, Take 400 mg by mouth daily., Disp: , Rfl:  .  docusate sodium (COLACE) 100 MG capsule, Take 100 mg by mouth at bedtime., Disp: , Rfl:  .  EUTHYROX 75 MCG tablet, Take 1 tablet by mouth once daily with breakfast, Disp: 90 tablet, Rfl: 0 .  fexofenadine (ALLEGRA) 180 MG tablet, Take 180 mg by mouth daily as needed for allergies or rhinitis., Disp: , Rfl:  .  Fluticasone Furoate (ARNUITY ELLIPTA) 100 MCG/ACT AEPB, Inhale 1 puff into the lungs daily., Disp: 30 each, Rfl: 3 .  glucosamine-chondroitin 500-400 MG tablet, Take 1 tablet by mouth 3 (three) times daily. (Patient taking differently: Take 2 tablets by mouth daily. ), Disp: 90 tablet, Rfl: 3 .  losartan (COZAAR) 50 MG tablet, Take 1 tablet by mouth once daily, Disp: 90 tablet, Rfl: 0 .  magnesium oxide (MAG-OX) 400 MG tablet, Take 400 mg by mouth daily., Disp: , Rfl:  .  metoCLOPramide (REGLAN) 5 MG tablet, Take 5 mg by mouth 2 (two) times daily., Disp: , Rfl:  .  Multiple Vitamin (MULTIVITAMIN) tablet, Take 1 tablet by mouth daily., Disp: , Rfl:  .  Omega-3 Fatty Acids (FISH OIL) 1000 MG CAPS, Take 1,000 mg by mouth daily., Disp: , Rfl:  .  pantoprazole (PROTONIX) 40 MG tablet, Take 40 mg by mouth daily., Disp: , Rfl:  .  Probiotic Product (PROBIOTIC  DAILY PO), Take by mouth daily., Disp: , Rfl:   EXAM:  VITALS per patient if applicable: 944/96  GENERAL: alert, oriented, appears well and in no acute distress  HEENT: atraumatic, conjunttiva clear, no obvious abnormalities on inspection of external nose and ears  NECK: normal movements of the head and neck  LUNGS: on inspection no signs of respiratory distress, breathing rate appears normal, no obvious gross SOB, gasping or wheezing  CV: no obvious cyanosis  PSYCH/NEURO: pleasant and cooperative, no obvious depression or anxiety, speech and thought processing grossly intact  ASSESSMENT AND PLAN:  Discussed the following assessment and plan:  Abdominal pain Has had extensive GI  w/up as outlined.  Was doing well on protonix and reglan.  Feels as if food is getting stuck.  Early satiety.  Discomfort as outlined. Discussed further w/up.  Will d/w GI.  Question of need for f/u CT.    Anemia, iron deficiency Follow cbc.   Aortic atherosclerosis (HCC) On lipitor.   CKD (chronic kidney disease) stage 4, GFR 15-29 ml/min [facility]) Now seeing Dr Percell Miller.  Just evaluated.  Continue to avoid antiinflammatories.    Fatty liver Continue to keep sugar under good control.  Diet and exercise.  Follow liver function tests.    GERD (gastroesophageal reflux disease) Controlled on protonix.  EGD 09/09/18 - minimal/mild chronic gastritis.    Hypercholesterolemia On lipitor.  Low cholesterol diet and exercise.  Follow lipid panel and liver function tests.    Hypertension Blood pressure under good control.  Continue same medication regimen.  Follow pressures.  Follow metabolic panel.    Hypothyroidism On thyroid replacement.  Follow tsh.    Sleep difficulties On melatonin.  Follow.      I discussed the assessment and treatment plan with the patient. The patient was provided an opportunity to ask questions and all were answered. The patient agreed with the plan and demonstrated an  understanding of the instructions.   The patient was advised to call back or seek an in-person evaluation if the symptoms worsen or if the condition fails to improve as anticipated.   Einar Pheasant, MD

## 2019-04-23 ENCOUNTER — Encounter: Payer: Self-pay | Admitting: Internal Medicine

## 2019-04-23 NOTE — Assessment & Plan Note (Signed)
Follow cbc.  

## 2019-04-23 NOTE — Assessment & Plan Note (Signed)
On lipitor.  Low cholesterol diet and exercise.  Follow lipid panel and liver function tests.   

## 2019-04-23 NOTE — Assessment & Plan Note (Signed)
Controlled on protonix.  EGD 09/09/18 - minimal/mild chronic gastritis.

## 2019-04-23 NOTE — Assessment & Plan Note (Signed)
Blood pressure under good control.  Continue same medication regimen.  Follow pressures.  Follow metabolic panel.   

## 2019-04-23 NOTE — Assessment & Plan Note (Signed)
Has had extensive GI w/up as outlined.  Was doing well on protonix and reglan.  Feels as if food is getting stuck.  Early satiety.  Discomfort as outlined. Discussed further w/up.  Will d/w GI.  Question of need for f/u CT.

## 2019-04-23 NOTE — Assessment & Plan Note (Signed)
On melatonin.  Follow.

## 2019-04-23 NOTE — Assessment & Plan Note (Signed)
Continue to keep sugar under good control.  Diet and exercise.  Follow liver function tests.

## 2019-04-23 NOTE — Assessment & Plan Note (Signed)
On thyroid replacement.  Follow tsh.  

## 2019-04-23 NOTE — Assessment & Plan Note (Signed)
On lipitor

## 2019-04-23 NOTE — Assessment & Plan Note (Signed)
Now seeing Dr Percell Miller.  Just evaluated.  Continue to avoid antiinflammatories.

## 2019-05-29 ENCOUNTER — Other Ambulatory Visit: Payer: Self-pay | Admitting: Internal Medicine

## 2019-06-02 ENCOUNTER — Ambulatory Visit: Payer: PPO | Admitting: Pulmonary Disease

## 2019-06-02 DIAGNOSIS — K449 Diaphragmatic hernia without obstruction or gangrene: Secondary | ICD-10-CM

## 2019-06-02 DIAGNOSIS — R918 Other nonspecific abnormal finding of lung field: Secondary | ICD-10-CM

## 2019-06-02 DIAGNOSIS — K219 Gastro-esophageal reflux disease without esophagitis: Secondary | ICD-10-CM

## 2019-06-02 DIAGNOSIS — Z9889 Other specified postprocedural states: Secondary | ICD-10-CM

## 2019-06-02 DIAGNOSIS — Z85820 Personal history of malignant melanoma of skin: Secondary | ICD-10-CM

## 2019-06-02 NOTE — Patient Instructions (Signed)
We are going to schedule a CT scan of the chest without contrast in 3 months to follow-up on your lung nodule  We will schedule a follow-up with me after the CT scan is done  Please follow-up with Dr. Ricky Stabs office with regards to appointment for the issues they are having with discomfort after meals, let us know if you have any difficulties getting in and we will be glad to assist with that

## 2019-06-02 NOTE — Progress Notes (Signed)
    Assessment & Plan:  1. Lung nodules (Primary)  2. Gastroesophageal reflux disease with hiatal hernia  3. History of melanoma excision  4. Abnormal findings on diagnostic imaging of lung - CT Chest Wo Contrast; Future   Patient Instructions  We are going to schedule a CT scan of the chest without contrast in 3 months to follow-up on your lung nodule  We will schedule a follow-up with me after the CT scan is done  Please follow-up with Dr. Sanjuan office with regards to appointment for the issues they are having with discomfort after meals, let us  know if you have any difficulties getting in and we will be glad to assist with that  Please note: late entry documentation due to logistical difficulties during COVID-19 pandemic. This note is filed for information purposes only, and is not intended to be used for billing, nor does it represent the full scope/nature of the visit in question. Please see any associated scanned media linked to date of encounter for additional pertinent information.  Subjective:    HPI: Caitlin Lin is a 79 y.o. female presenting to the pulmonology clinic on 06/02/2019 with report of: No chief complaint on file.     Outpatient Encounter Medications as of 06/02/2019  Medication Sig   aspirin  81 MG tablet Take 1 tablet (81 mg total) by mouth daily.   Multiple Vitamin (MULTIVITAMIN) tablet Take 1 tablet by mouth daily.   Omega-3 Fatty Acids (FISH OIL ) 1000 MG CAPS Take 1,000 mg by mouth daily.   [DISCONTINUED] amLODipine  (NORVASC ) 2.5 MG tablet Take 1 tablet by mouth once daily   [DISCONTINUED] atorvastatin  (LIPITOR) 20 MG tablet Take 1 tablet by mouth once daily   [DISCONTINUED] calcium  carbonate (OSCAL) 1500 (600 Ca) MG TABS tablet Take 600 mg by mouth daily with breakfast.   [DISCONTINUED] Cholecalciferol  25 MCG (1000 UT) capsule Take 2,000 Units by mouth daily.    [DISCONTINUED] Cranberry 400 MG TABS Take 400 mg by mouth daily. (Patient not  taking: Reported on 10/31/2021)   [DISCONTINUED] docusate sodium (COLACE) 100 MG capsule Take 100 mg by mouth at bedtime.   [DISCONTINUED] EUTHYROX  75 MCG tablet Take 1 tablet by mouth once daily with breakfast   [DISCONTINUED] fexofenadine  (ALLEGRA) 180 MG tablet Take 180 mg by mouth daily as needed for allergies or rhinitis. (Patient not taking: Reported on 10/26/2019)   [DISCONTINUED] Fluticasone Furoate  (ARNUITY ELLIPTA ) 100 MCG/ACT AEPB Inhale 1 puff into the lungs daily.   [DISCONTINUED] glucosamine-chondroitin 500-400 MG tablet Take 1 tablet by mouth 3 (three) times daily. (Patient taking differently: Take 2 tablets by mouth daily. )   [DISCONTINUED] losartan  (COZAAR ) 50 MG tablet Take 1 tablet by mouth once daily   [DISCONTINUED] magnesium  oxide (MAG-OX) 400 MG tablet Take 400 mg by mouth daily. (Patient not taking: Reported on 09/15/2019)   [DISCONTINUED] metoCLOPramide  (REGLAN ) 5 MG tablet Take 5 mg by mouth 2 (two) times daily.   [DISCONTINUED] pantoprazole  (PROTONIX ) 40 MG tablet Take 40 mg by mouth daily.   [DISCONTINUED] Probiotic Product (PROBIOTIC DAILY PO) Take by mouth daily. (Patient not taking: Reported on 04/23/2023)   No facility-administered encounter medications on file as of 06/02/2019.      Objective:   There were no vitals filed for this visit.   Physical exam documentation is limited by delayed entry of information.

## 2019-06-12 ENCOUNTER — Other Ambulatory Visit: Payer: Self-pay | Admitting: Pulmonary Disease

## 2019-06-14 ENCOUNTER — Other Ambulatory Visit: Payer: Self-pay | Admitting: Internal Medicine

## 2019-06-19 ENCOUNTER — Other Ambulatory Visit: Payer: Self-pay | Admitting: Internal Medicine

## 2019-07-31 ENCOUNTER — Ambulatory Visit (INDEPENDENT_AMBULATORY_CARE_PROVIDER_SITE_OTHER): Payer: PPO | Admitting: Internal Medicine

## 2019-07-31 ENCOUNTER — Other Ambulatory Visit: Payer: Self-pay

## 2019-07-31 ENCOUNTER — Other Ambulatory Visit: Payer: PPO

## 2019-07-31 VITALS — BP 130/72 | HR 77 | Temp 97.7°F | Resp 16 | Ht 64.0 in | Wt 159.6 lb

## 2019-07-31 DIAGNOSIS — D509 Iron deficiency anemia, unspecified: Secondary | ICD-10-CM | POA: Diagnosis not present

## 2019-07-31 DIAGNOSIS — R1030 Lower abdominal pain, unspecified: Secondary | ICD-10-CM | POA: Diagnosis not present

## 2019-07-31 DIAGNOSIS — I10 Essential (primary) hypertension: Secondary | ICD-10-CM

## 2019-07-31 DIAGNOSIS — E039 Hypothyroidism, unspecified: Secondary | ICD-10-CM | POA: Diagnosis not present

## 2019-07-31 DIAGNOSIS — I700 Atherosclerosis of aorta: Secondary | ICD-10-CM

## 2019-07-31 DIAGNOSIS — E7800 Pure hypercholesterolemia, unspecified: Secondary | ICD-10-CM | POA: Diagnosis not present

## 2019-07-31 DIAGNOSIS — K219 Gastro-esophageal reflux disease without esophagitis: Secondary | ICD-10-CM

## 2019-07-31 DIAGNOSIS — R1310 Dysphagia, unspecified: Secondary | ICD-10-CM | POA: Diagnosis not present

## 2019-07-31 DIAGNOSIS — K760 Fatty (change of) liver, not elsewhere classified: Secondary | ICD-10-CM | POA: Diagnosis not present

## 2019-07-31 DIAGNOSIS — D729 Disorder of white blood cells, unspecified: Secondary | ICD-10-CM

## 2019-07-31 DIAGNOSIS — N184 Chronic kidney disease, stage 4 (severe): Secondary | ICD-10-CM

## 2019-07-31 LAB — CBC WITH DIFFERENTIAL/PLATELET
Basophils Absolute: 0 10*3/uL (ref 0.0–0.1)
Basophils Relative: 0.5 % (ref 0.0–3.0)
Eosinophils Absolute: 0.1 10*3/uL (ref 0.0–0.7)
Eosinophils Relative: 1.3 % (ref 0.0–5.0)
HCT: 38.6 % (ref 36.0–46.0)
Hemoglobin: 13 g/dL (ref 12.0–15.0)
Lymphocytes Relative: 56.5 % — ABNORMAL HIGH (ref 12.0–46.0)
Lymphs Abs: 2.6 10*3/uL (ref 0.7–4.0)
MCHC: 33.8 g/dL (ref 30.0–36.0)
MCV: 87.6 fl (ref 78.0–100.0)
Monocytes Absolute: 0.7 10*3/uL (ref 0.1–1.0)
Monocytes Relative: 14 % — ABNORMAL HIGH (ref 3.0–12.0)
Neutro Abs: 1.3 10*3/uL — ABNORMAL LOW (ref 1.4–7.7)
Neutrophils Relative %: 27.7 % — ABNORMAL LOW (ref 43.0–77.0)
Platelets: 167 10*3/uL (ref 150.0–400.0)
RBC: 4.41 Mil/uL (ref 3.87–5.11)
RDW: 13.9 % (ref 11.5–15.5)
WBC: 4.7 10*3/uL (ref 4.0–10.5)

## 2019-07-31 LAB — BASIC METABOLIC PANEL
BUN: 27 mg/dL — ABNORMAL HIGH (ref 6–23)
CO2: 24 mEq/L (ref 19–32)
Calcium: 9.3 mg/dL (ref 8.4–10.5)
Chloride: 106 mEq/L (ref 96–112)
Creatinine, Ser: 1.66 mg/dL — ABNORMAL HIGH (ref 0.40–1.20)
GFR: 29.86 mL/min — ABNORMAL LOW (ref >60.00)
Glucose, Bld: 99 mg/dL (ref 70–99)
Potassium: 4.3 mEq/L (ref 3.5–5.1)
Sodium: 137 mEq/L (ref 135–145)

## 2019-07-31 LAB — AMYLASE: Amylase: 97 U/L (ref 27–131)

## 2019-07-31 LAB — HEPATIC FUNCTION PANEL
ALT: 22 U/L (ref 0–35)
AST: 30 U/L (ref 0–37)
Albumin: 4 g/dL (ref 3.5–5.2)
Alkaline Phosphatase: 109 U/L (ref 39–117)
Bilirubin, Direct: 0.1 mg/dL (ref 0.0–0.3)
Total Bilirubin: 0.6 mg/dL (ref 0.2–1.2)
Total Protein: 7.1 g/dL (ref 6.0–8.3)

## 2019-07-31 LAB — LIPASE: Lipase: 55 U/L (ref 11.0–59.0)

## 2019-07-31 NOTE — Progress Notes (Signed)
Patient ID: Caitlin Lin, female   DOB: 07/02/1940, 79 y.o.   MRN: [id number]   Subjective:    Patient ID: Caitlin Lin, female    DOB: 1940-09-04, 79 y.o.   MRN: [id number]  HPI This visit occurred during the SARS-CoV-2 public health emergency.  Safety protocols were in place, including screening questions prior to the visit, additional usage of staff PPE, and extensive cleaning of exam room while observing appropriate contact time as indicated for disinfecting solutions.  Patient here for her physical exam.  She reports increased fatigue.  No chest pain. Tries to stay active.  Takes reglan daily.  Also taking protonix - takes with other medication.  Still has issues with bloating.  Feels like food is stopping and not going down - despite medications.  Has had previous w/up including EGD, HIDA and ultrasound.  Seeing GI.  Concerned regarding persistent symptoms.  Breathing overall stable.  No diarrhea.  Blood pressures averaging 130s/70s.  Seeing pulmonary - Dr Patsey Berthold.  Evaluated 05/2019.  Recommended f/u in 3 months.     Past Medical History:  Diagnosis Date  . Cataracts, bilateral   . Chronic kidney disease    Followed by Dr. Holley Raring  . Chronic kidney disease   . Colon polyp   . Constipation due to slow transit   . Diarrhea   . Diarrhea   . GERD (gastroesophageal reflux disease)   . Heart murmur   . History of cyst of breast   . History of hiatal hernia   . Hyperlipidemia   . Hypertension   . Hypothyroidism   . Melanoma of lower leg ([place]) 2013  . Shingles 2013  . Shingles   . Spinal stenosis in cervical region    Dr. Sharlet Salina  . Subdural hematoma ([place])   . Thyroid disease    Past Surgical History:  Procedure Laterality Date  . ABDOMINAL HYSTERECTOMY  1974  . ANTERIOR AND POSTERIOR VAGINAL REPAIR    . ANTERIOR AND POSTERIOR VAGINAL REPAIR W/ SACROSPINOUS LIGAMENT SUSPENSION Right   . BREAST BIOPSY  1963   Benign per pt's report  . COLONOSCOPY    . ELECTROMAGNETIC  NAVIGATION BROCHOSCOPY Right 01/09/2019   Procedure: ELECTROMAGNETIC NAVIGATION BRONCHOSCOPY;  Surgeon: Tyler Pita, MD;  Location: [facility] ORS;  Service: Cardiopulmonary;  Laterality: Right;  . ESOPHAGOGASTRODUODENOSCOPY    . ESOPHAGOGASTRODUODENOSCOPY (EGD) WITH PROPOFOL N/A 11/10/2016   Procedure: ESOPHAGOGASTRODUODENOSCOPY (EGD) WITH PROPOFOL;  Surgeon: Lollie Sails, MD;  Location: [facility] ENDOSCOPY;  Service: Endoscopy;  Laterality: N/A;  . ESOPHAGOGASTRODUODENOSCOPY (EGD) WITH PROPOFOL N/A 09/09/2018   Procedure: ESOPHAGOGASTRODUODENOSCOPY (EGD) WITH PROPOFOL;  Surgeon: Lollie Sails, MD;  Location: [facility]COPY;  Service: Endoscopy;  Laterality: N/A;  . LUNG BIOPSY  2007   Benign per pt's report  . LUNG BIOPSY Right   . OOPHORECTOMY  1970  . PERINEOPLASTY  12/17/2013  . sling for stress incontinence  12/18/2011   Family History  Problem Relation Age of Onset  . Arthritis Mother   . Liver disease Mother   . Cirrhosis Mother   . Liver cancer Mother   . Heart disease Father   . Heart attack Father   . Coronary artery disease Father   . Depression Brother   . Breast cancer Other   . Breast cancer Cousin        maternal cousins  . Liver cancer Cousin   . Diabetes Maternal Aunt    Social History   Socioeconomic History  . Marital status:  Married    Spouse name: Not on file  . Number of children: 2  . Years of education: Not on file  . Highest education level: Not on file  Occupational History  . Occupation: Retired  Tobacco Use  . Smoking status: Never Smoker  . Smokeless tobacco: Never Used  Substance and Sexual Activity  . Alcohol use: No  . Drug use: No  . Sexual activity: Yes  Other Topics Concern  . Not on file  Social History Narrative   Lives in [place] with husband. Has 2 children boy and girl, 4 grandchildren. Retired Presenter, broadcasting.       Daily Caffeine Use:  NO   Regular Exercise -  Not at this time due to back pain, would like to resume soon    Social Determinants of Health   Financial Resource Strain:   . Difficulty of Paying Living Expenses:   Food Insecurity:   . Worried About Charity fundraiser in the Last Year:   . Arboriculturist in the Last Year:   Transportation Needs:   . Film/video editor (Medical):   Marland Kitchen Lack of Transportation (Non-Medical):   Physical Activity:   . Days of Exercise per Week:   . Minutes of Exercise per Session:   Stress:   . Feeling of Stress :   Social Connections: Not Isolated  . Frequency of Communication with Friends and Family: Three times a week  . Frequency of Social Gatherings with Friends and Family: Once a week  . Attends Religious Services: More than 4 times per year  . Active Member of Clubs or Organizations: Yes  . Attends Archivist Meetings: More than 4 times per year  . Marital Status: Married    Outpatient Encounter Medications as of 07/31/2019  Medication Sig  . amLODipine (NORVASC) 2.5 MG tablet Take 1 tablet by mouth once daily  . ARNUITY ELLIPTA 100 MCG/ACT AEPB Inhale 1 puff by mouth once daily  . aspirin 81 MG tablet Take 1 tablet (81 mg total) by mouth daily. (Patient taking differently: Take 81 mg by mouth at bedtime. )  . atorvastatin (LIPITOR) 20 MG tablet Take 1 tablet by mouth once daily  . calcium carbonate (OSCAL) 1500 (600 Ca) MG TABS tablet Take 600 mg by mouth daily with breakfast.  . Cholecalciferol 25 MCG (1000 UT) capsule Take 2,000 Units by mouth daily.   . Cranberry 400 MG TABS Take 400 mg by mouth daily.  Marland Kitchen docusate sodium (COLACE) 100 MG capsule Take 100 mg by mouth at bedtime.  Arna Medici 75 MCG tablet Take 1 tablet by mouth once daily with breakfast  . fexofenadine (ALLEGRA) 180 MG tablet Take 180 mg by mouth daily as needed for allergies or rhinitis.  Marland Kitchen glucosamine-chondroitin 500-400 MG tablet Take 1 tablet by mouth 3 (three) times daily. (Patient taking differently: Take 2 tablets by mouth daily. )  . magnesium oxide (MAG-OX) 400  MG tablet Take 400 mg by mouth daily.  . metoCLOPramide (REGLAN) 5 MG tablet Take 5 mg by mouth 2 (two) times daily.  . Multiple Vitamin (MULTIVITAMIN) tablet Take 1 tablet by mouth daily.  . Omega-3 Fatty Acids (FISH OIL) 1000 MG CAPS Take 1,000 mg by mouth daily.  . pantoprazole (PROTONIX) 40 MG tablet Take 40 mg by mouth daily.  . Probiotic Product (PROBIOTIC DAILY PO) Take by mouth daily.  . [DISCONTINUED] losartan (COZAAR) 50 MG tablet Take 1 tablet by mouth once daily  No facility-administered encounter medications on file as of 07/31/2019.   Review of Systems  Constitutional: Negative for appetite change and unexpected weight change.  HENT: Negative for congestion and sinus pain.   Respiratory: Negative for cough, chest tightness and shortness of breath.   Cardiovascular: Negative for chest pain, palpitations and leg swelling.  Gastrointestinal: Negative for diarrhea and vomiting.       Abdominal bloating and discomfort as outlined.    Genitourinary: Negative for difficulty urinating and dysuria.  Musculoskeletal: Negative for joint swelling and myalgias.  Skin: Negative for color change and rash.  Neurological: Negative for dizziness, light-headedness and headaches.  Psychiatric/Behavioral: Negative for agitation and dysphoric mood.       Objective:    Physical Exam Vitals reviewed.  Constitutional:      General: She is not in acute distress.    Appearance: Normal appearance.  HENT:     Head: Normocephalic and atraumatic.     Right Ear: External ear normal.     Left Ear: External ear normal.  Eyes:     General: No scleral icterus.       Right eye: No discharge.        Left eye: No discharge.     Conjunctiva/sclera: Conjunctivae normal.  Neck:     Thyroid: No thyromegaly.  Cardiovascular:     Rate and Rhythm: Normal rate and regular rhythm.  Pulmonary:     Effort: No respiratory distress.     Breath sounds: Normal breath sounds. No wheezing.  Abdominal:      General: Bowel sounds are normal.     Palpations: Abdomen is soft.     Tenderness: There is no abdominal tenderness.  Musculoskeletal:        General: No swelling or tenderness.     Cervical back: Neck supple.  Lymphadenopathy:     Cervical: No cervical adenopathy.  Skin:    Findings: No erythema or rash.  Neurological:     Mental Status: She is alert.  Psychiatric:        Mood and Affect: Mood normal.        Behavior: Behavior normal.     BP 130/72   Pulse 77   Temp 97.7 F (36.5 C)   Resp 16   Ht 5\' 4"  (1.626 m)   Wt 159 lb 9.6 oz (72.4 kg)   SpO2 99%   BMI 27.40 kg/m  Wt Readings from Last 3 Encounters:  07/31/19 159 lb 9.6 oz (72.4 kg)  04/20/19 161 lb (73 kg)  03/15/19 162 lb 12.8 oz (73.8 kg)     Lab Results  Component Value Date   WBC 4.7 07/31/2019   HGB 13.0 07/31/2019   HCT 38.6 07/31/2019   PLT 167.0 07/31/2019   GLUCOSE 99 07/31/2019   CHOL 153 12/07/2018   TRIG 197.0 (H) 12/07/2018   HDL 38.80 (L) 12/07/2018   LDLDIRECT 84.0 07/22/2018   LDLCALC 74 12/07/2018   ALT 22 07/31/2019   AST 30 07/31/2019   NA 137 07/31/2019   K 4.3 07/31/2019   CL 106 07/31/2019   CREATININE 1.66 (H) 07/31/2019   BUN 27 (H) 07/31/2019   CO2 24 07/31/2019   TSH 3.55 07/22/2018   HGBA1C 5.8 12/07/2018   MICROALBUR 2.1 (H) 03/27/2015    CT CHEST WO CONTRAST  Result Date: 03/01/2019 CLINICAL DATA:  History of lower extremity melanoma. Follow-up right upper lobe pulmonary nodules. Right upper lobe bronchoscopic biopsy 01/09/2019 demonstrated no evidence of malignancy. EXAM: CT  CHEST WITHOUT CONTRAST TECHNIQUE: Multidetector CT imaging of the chest was performed following the standard protocol without IV contrast. COMPARISON:  12/19/2018 chest CT. FINDINGS: Cardiovascular: Normal heart size. No significant pericardial effusion/thickening. Left anterior descending coronary atherosclerosis. Atherosclerotic nonaneurysmal thoracic aorta. Normal caliber pulmonary arteries.  Mediastinum/Nodes: No discrete thyroid nodules. Unremarkable esophagus. No pathologically enlarged axillary, mediastinal or hilar lymph nodes, noting limited sensitivity for the detection of hilar adenopathy on this noncontrast study. Lungs/Pleura: No pneumothorax. No pleural effusion. Lobulated pulmonary nodularity in the posterior right upper lobe with branching morphology measures 2.2 x 1.7 cm (series 3/image 51), decreased from 2.7 x 2.1 cm on 12/19/2018 chest CT, with decreased surrounding ground-glass opacity, with associated stable punctate internal calcifications. Stable 3 mm superior segment right lower lobe nodule (series 3/image 59). Anterior left upper lobe irregular branching pulmonary nodule measures up to 1.1 x 0.7 cm (series 3/image 60), stable. No acute consolidative airspace disease or new significant pulmonary nodules. Mild cylindrical bronchiectasis in the medial right lower lobe, with associated mild parenchymal banding, unchanged. Upper abdomen: Small hiatal hernia. Musculoskeletal: No aggressive appearing focal osseous lesions. Marked thoracic spondylosis. IMPRESSION: 1. Pulmonary nodularity in the posterior right upper lobe with branching morphology has mildly decreased in size since 12/19/2018 chest CT, with decreased surrounding ground-glass opacity. Similar branching anterior left upper lobe 1.1 cm pulmonary nodule is stable. Stable mild cylindrical bronchiectasis at the medial right lung base. This spectrum of findings is most suggestive of a chronic waxing/waning infectious process such as atypical mycobacterial infection (MAI). Continued chest CT follow-up recommended in 6 months. 2. One vessel coronary atherosclerosis. 3. Small hiatal hernia. Aortic Atherosclerosis (ICD10-I70.0). Electronically Signed   By: Ilona Sorrel M.D.   On: 03/01/2019 14:57       Assessment & Plan:   Problem List Items Addressed This Visit    Abdominal pain - Primary    Persistent abdominal pain and  bloating.  On reglan and protonix.  S/p EGD, HIDA and ultrasound.  Given persistent symptoms and w/up unrevealing - schedule CT scan.  Needs f/u with GI.       Relevant Orders   CBC with Differential/Platelet (Completed)   Hepatic function panel (Completed)   Basic metabolic panel (Completed)   Amylase (Completed)   Lipase (Completed)   CT Abdomen Pelvis Wo Contrast   Anemia, iron deficiency    Follow cbc.       Aortic atherosclerosis (HCC)    Continue lipitor.        CKD (chronic kidney disease) stage 4, GFR 15-29 ml/min (HCC)    Followed by nephrology.  Continue to avoid antiinflammatories.        Dysphagia    Dysphagia as outlined.  On reglan and protonix.  Needs f/u with GI.        Relevant Orders   CT Abdomen Pelvis Wo Contrast   Fatty liver    Diet and exercise.  Follow liver function tests.        Relevant Orders   CT Abdomen Pelvis Wo Contrast   GERD (gastroesophageal reflux disease)    On protonix.  Dysphagia as outlined.  Needs f/u with GI.       Hypercholesterolemia    On lipitor.  Low cholesterol diet and exercise.  Follow lipid panel and liver function tests.        Hypertension (Chronic)    Blood pressure as outlined.  On amlodipine.  Follow pressures.  Follow metabolic panel.       Hypothyroidism (Chronic)  On thyroid replacement.  Follow tsh.            Einar Pheasant, MD

## 2019-08-01 LAB — PATHOLOGIST SMEAR REVIEW

## 2019-08-07 ENCOUNTER — Other Ambulatory Visit: Payer: Self-pay | Admitting: Internal Medicine

## 2019-08-08 ENCOUNTER — Telehealth: Payer: Self-pay | Admitting: Internal Medicine

## 2019-08-08 NOTE — Telephone Encounter (Signed)
Pt states that she has not heard anything from Dr. [name] for [place] and also needs CT scan of abdomen. Please advise.

## 2019-08-09 ENCOUNTER — Encounter: Payer: Self-pay | Admitting: Internal Medicine

## 2019-08-09 DIAGNOSIS — R1310 Dysphagia, unspecified: Secondary | ICD-10-CM | POA: Insufficient documentation

## 2019-08-09 NOTE — Assessment & Plan Note (Signed)
Persistent abdominal pain and bloating.  On reglan and protonix.  S/p EGD, HIDA and ultrasound.  Given persistent symptoms and w/up unrevealing - schedule CT scan.  Needs f/u with GI.

## 2019-08-09 NOTE — Assessment & Plan Note (Signed)
Blood pressure as outlined.  On amlodipine.  Follow pressures.  Follow metabolic panel.

## 2019-08-09 NOTE — Assessment & Plan Note (Signed)
Diet and exercise.  Follow liver function tests.   

## 2019-08-09 NOTE — Assessment & Plan Note (Signed)
On lipitor.  Low cholesterol diet and exercise.  Follow lipid panel and liver function tests.   

## 2019-08-09 NOTE — Assessment & Plan Note (Signed)
Dysphagia as outlined.  On reglan and protonix.  Needs f/u with GI.

## 2019-08-09 NOTE — Assessment & Plan Note (Signed)
On thyroid replacement.  Follow tsh.  

## 2019-08-09 NOTE — Assessment & Plan Note (Signed)
Follow cbc.  

## 2019-08-09 NOTE — Telephone Encounter (Signed)
Please call and notify pt that I did talk with GI and they are wanting to see her.  They scheduled her for an appt with GI - 08/17/19 - 8:30.  If a problem, she can call and change.  Let me know if needs anything more.  CT is scheduled.

## 2019-08-09 NOTE — Assessment & Plan Note (Signed)
Followed by nephrology.  Continue to avoid antiinflammatories.

## 2019-08-09 NOTE — Assessment & Plan Note (Signed)
Continue lipitor  ?

## 2019-08-09 NOTE — Assessment & Plan Note (Signed)
On protonix.  Dysphagia as outlined.  Needs f/u with GI.

## 2019-08-10 NOTE — Telephone Encounter (Signed)
Left detailed message for patient.

## 2019-08-15 DIAGNOSIS — E875 Hyperkalemia: Secondary | ICD-10-CM | POA: Diagnosis not present

## 2019-08-15 DIAGNOSIS — N184 Chronic kidney disease, stage 4 (severe): Secondary | ICD-10-CM | POA: Diagnosis not present

## 2019-08-15 DIAGNOSIS — I150 Renovascular hypertension: Secondary | ICD-10-CM | POA: Diagnosis not present

## 2019-08-15 NOTE — Telephone Encounter (Signed)
Pt aware of appt.

## 2019-08-17 DIAGNOSIS — R1310 Dysphagia, unspecified: Secondary | ICD-10-CM | POA: Diagnosis not present

## 2019-08-17 DIAGNOSIS — R1013 Epigastric pain: Secondary | ICD-10-CM | POA: Diagnosis not present

## 2019-08-28 ENCOUNTER — Ambulatory Visit
Admission: RE | Admit: 2019-08-28 | Discharge: 2019-08-28 | Disposition: A | Discharge status: Home / Self Care | Payer: PPO | Source: Ambulatory Visit | Attending: Internal Medicine | Admitting: Internal Medicine

## 2019-08-28 ENCOUNTER — Other Ambulatory Visit: Payer: Self-pay

## 2019-08-28 ENCOUNTER — Ambulatory Visit
Admission: RE | Admit: 2019-08-28 | Discharge: 2019-08-28 | Disposition: A | Discharge status: Home / Self Care | Payer: PPO | Source: Ambulatory Visit | Attending: Pulmonary Disease | Admitting: Pulmonary Disease

## 2019-08-28 DIAGNOSIS — R918 Other nonspecific abnormal finding of lung field: Secondary | ICD-10-CM | POA: Insufficient documentation

## 2019-08-28 DIAGNOSIS — K449 Diaphragmatic hernia without obstruction or gangrene: Secondary | ICD-10-CM | POA: Diagnosis not present

## 2019-08-28 DIAGNOSIS — K760 Fatty (change of) liver, not elsewhere classified: Secondary | ICD-10-CM | POA: Diagnosis not present

## 2019-08-28 DIAGNOSIS — K5730 Diverticulosis of large intestine without perforation or abscess without bleeding: Secondary | ICD-10-CM | POA: Diagnosis not present

## 2019-08-28 DIAGNOSIS — R1030 Lower abdominal pain, unspecified: Secondary | ICD-10-CM | POA: Insufficient documentation

## 2019-08-28 DIAGNOSIS — R1310 Dysphagia, unspecified: Secondary | ICD-10-CM | POA: Diagnosis not present

## 2019-09-01 ENCOUNTER — Other Ambulatory Visit: Payer: Self-pay | Admitting: Internal Medicine

## 2019-09-04 ENCOUNTER — Telehealth: Payer: Self-pay | Admitting: Cardiology

## 2019-09-04 NOTE — Telephone Encounter (Signed)
   [facility] Pre-operative Risk Assessment    HEARTCARE STAFF: - Please ensure there is not already an duplicate clearance open for this procedure. - Under Visit Info/Reason for Call, type in Other and utilize the format Clearance MM/DD/YY or Clearance TBD. Do not use dashes or single digits. - If request is for dental extraction, please clarify the # of teeth to be extracted.  Request for surgical clearance:  1. What type of surgery is being performed?   EGD  2. When is this surgery scheduled? 10/27/19   3. What type of clearance is required (medical clearance vs. Pharmacy clearance to hold med vs. Both)? Not sure patient calling   4. Are there any medications that need to be held prior to surgery and how long? unknown  5. Practice name and name of physician performing surgery? [facility]    6. What is the office phone number? [phone]   7.   What is the office fax number? [phone]  8.   Anesthesia type (None, local, MAC, general) ? unknown   Clarisse Gouge 09/04/2019, 10:45 AM  _________________________________________________________________   (provider comments below)  '

## 2019-09-04 NOTE — Telephone Encounter (Signed)
   Primary Cardiologist:Brian Agbor-Etang, MD  Chart reviewed as part of pre-operative protocol coverage. Because of Teesha Ohm Swenor's past medical history and time since last visit, they will require a follow-up visit in order to better assess preoperative cardiovascular risk.  Pre-op covering staff: - Please schedule appointment and call patient to inform them. If patient already had an upcoming appointment within acceptable timeframe, please add "pre-op clearance" to the appointment notes so provider is aware. - Please contact requesting surgeon's office via preferred method (i.e, phone, fax) to inform them of need for appointment prior to surgery.  If applicable, this message will also be routed to pharmacy pool and/or primary cardiologist for input on holding anticoagulant/antiplatelet agent as requested below so that this information is available to the clearing provider at time of patient's appointment.   [place], [place]  09/04/2019, 12:21 PM

## 2019-09-04 NOTE — Telephone Encounter (Signed)
Pt is agreeable to appt for pre op assessment. I have scheduled pt an appt with Laurann Montana, NP for 09/15/19 @ 8 am. I will forward clearance notes to NP for upcoming appt. I will remove from the pre op call back pool.

## 2019-09-06 ENCOUNTER — Ambulatory Visit: Payer: PPO

## 2019-09-12 ENCOUNTER — Telehealth: Payer: Self-pay | Admitting: Internal Medicine

## 2019-09-12 NOTE — Telephone Encounter (Signed)
Pt stated that you discussed this with her husband this AM and was instructed to call. Needs referral sent to Dr Manuella Ghazi for neuropathy in her feet

## 2019-09-12 NOTE — Telephone Encounter (Signed)
Pt said she needs a referral to Dr. Manuella Ghazi for her neuropathy.

## 2019-09-13 ENCOUNTER — Other Ambulatory Visit: Payer: Self-pay | Admitting: Internal Medicine

## 2019-09-13 DIAGNOSIS — G629 Polyneuropathy, unspecified: Secondary | ICD-10-CM

## 2019-09-13 NOTE — Telephone Encounter (Signed)
I have placed the order for the neurology referral.  Someone should be contacting her with an appt date and time.

## 2019-09-13 NOTE — Progress Notes (Signed)
Order placed for neurology referral.   

## 2019-09-15 ENCOUNTER — Other Ambulatory Visit: Payer: Self-pay

## 2019-09-15 ENCOUNTER — Encounter: Payer: Self-pay | Admitting: Family

## 2019-09-15 ENCOUNTER — Ambulatory Visit: Payer: PPO | Admitting: Family

## 2019-09-15 VITALS — BP 148/74 | HR 83 | Ht 64.0 in | Wt 159.4 lb

## 2019-09-15 DIAGNOSIS — I872 Venous insufficiency (chronic) (peripheral): Secondary | ICD-10-CM

## 2019-09-15 DIAGNOSIS — I2510 Atherosclerotic heart disease of native coronary artery without angina pectoris: Secondary | ICD-10-CM | POA: Diagnosis not present

## 2019-09-15 DIAGNOSIS — I10 Essential (primary) hypertension: Secondary | ICD-10-CM

## 2019-09-15 DIAGNOSIS — Z01810 Encounter for preprocedural cardiovascular examination: Secondary | ICD-10-CM

## 2019-09-15 DIAGNOSIS — I351 Nonrheumatic aortic (valve) insufficiency: Secondary | ICD-10-CM

## 2019-09-15 DIAGNOSIS — I700 Atherosclerosis of aorta: Secondary | ICD-10-CM | POA: Diagnosis not present

## 2019-09-15 DIAGNOSIS — I77810 Thoracic aortic ectasia: Secondary | ICD-10-CM | POA: Diagnosis not present

## 2019-09-15 NOTE — Patient Instructions (Signed)
Medication Instructions:  No medication changes today.   *If you need a refill on your cardiac medications before your next appointment, please call your pharmacy*  Lab Work: No lab work today.   If you have labs (blood work) drawn today and your tests are completely normal, you will receive your results only by: Marland Kitchen MyChart Message (if you have MyChart) OR . A paper copy in the mail If you have any lab test that is abnormal or we need to change your treatment, we will call you to review the results.   Testing/Procedures: Your EKG today shows normal sinus rhythm which is a great result.  Follow-Up: At [facility], you and your health needs are our priority.  As part of our continuing mission to provide you with exceptional heart care, we have created designated Provider Care Teams.  These Care Teams include your primary Cardiologist (physician) and Advanced Practice Providers (APPs -  Physician Assistants and Nurse Practitioners) who all work together to provide you with the care you need, when you need it.  We recommend signing up for the patient portal called "MyChart".  Sign up information is provided on this After Visit Summary.  MyChart is used to connect with patients for Virtual Visits (Telemedicine).  Patients are able to view lab/test results, encounter notes, upcoming appointments, etc.  Non-urgent messages can be sent to your provider as well.   To learn more about what you can do with MyChart, go to [email or web address].    Your next appointment:   April 2022 with Dr. Garen Lah as previously recommended   Other Instructions Chronic Venous Insufficiency Chronic venous insufficiency is a condition where the leg veins cannot effectively pump blood from the legs to the heart. This happens when the vein walls are either stretched, weakened, or damaged, or when the valves inside the vein are damaged. With the right treatment, you should be able to continue with an active  life. This condition is also called venous stasis. What are the causes? Common causes of this condition include:  High blood pressure inside the veins (venous hypertension).  Sitting or standing too long, causing increased blood pressure in the leg veins.  A blood clot that blocks blood flow in a vein (deep vein thrombosis, DVT).  Inflammation of a vein (phlebitis) that causes a blood clot to form.  Tumors in the pelvis that cause blood to back up. What increases the risk? The following factors may make you more likely to develop this condition:  Having a family history of this condition.  Obesity.  Pregnancy.  Living without enough regular physical activity or exercise (sedentary lifestyle).  Smoking.  Having a job that requires long periods of standing or sitting in one place.  Being a certain age. Women in their 22s and 64s and men in their 80s are more likely to develop this condition. What are the signs or symptoms? Symptoms of this condition include:  Veins that are enlarged, bulging, or twisted (varicose veins).  Skin breakdown or ulcers.  Reddened skin or dark discoloration of skin on the leg between the knee and ankle.  Brown, smooth, tight, and painful skin just above the ankle, usually on the inside of the leg (lipodermatosclerosis).  Swelling of the legs. How is this diagnosed? This condition may be diagnosed based on:  Your medical history.  A physical exam.  Tests, such as: ? A procedure that creates an image of a blood vessel and nearby organs and provides information about blood flow  through the blood vessel (duplex ultrasound). ? A procedure that tests blood flow (plethysmography). ? A procedure that looks at the veins using X-ray and dye (venogram). How is this treated? The goals of treatment are to help you return to an active life and to minimize pain or disability. Treatment depends on the severity of your condition, and it may  include:  Wearing compression stockings. These can help relieve symptoms and help prevent your condition from getting worse. However, they do not cure the condition. Follow these instructions at home:      Wear compression stockings as told by your health care provider. These stockings help to prevent blood clots and reduce swelling in your legs.  Take over-the-counter and prescription medicines only as told by your health care provider.  Stay active by exercising, walking, or doing different activities. Ask your health care provider what activities are safe for you and how much exercise you need.  Drink enough fluid to keep your urine pale yellow.  Do not use any products that contain nicotine or tobacco, such as cigarettes, e-cigarettes, and chewing tobacco. If you need help quitting, ask your health care provider.  Keep all follow-up visits as told by your health care provider. This is important. Contact a health care provider if you:  Have redness, swelling, or more pain in the affected area.  See a red streak or line that goes up or down from the affected area.  Have skin breakdown or skin loss in the affected area, even if the breakdown is small.  Get an injury in the affected area. Get help right away if:  You get an injury and an open wound in the affected area.  You have: ? Severe pain that does not get better with medicine. ? Sudden numbness or weakness in the foot or ankle below the affected area. ? Trouble moving your foot or ankle. ? A fever. ? Worse or persistent symptoms. ? Chest pain. ? Shortness of breath. Summary  Chronic venous insufficiency is a condition where the leg veins cannot effectively pump blood from the legs to the heart.  Chronic venous insufficiency occurs when the vein walls become stretched, weakened, or damaged, or when valves within the vein are damaged.  Treatment depends on how severe your condition is. It often involves wearing  compression stockings and may involve having a procedure.  Make sure you stay active by exercising, walking, or doing different activities. Ask your health care provider what activities are safe for you and how much exercise you need. This information is not intended to replace advice given to you by your health care provider. Make sure you discuss any questions you have with your health care provider. Document Revised: 11/23/2017 Document Reviewed: 11/23/2017 Elsevier Patient Education  [place].

## 2019-09-15 NOTE — Progress Notes (Signed)
Office Visit    Patient Name: Caitlin Lin Date of Encounter: 09/15/2019  Primary Care Provider:  Einar Pheasant, MD Primary Cardiologist:  Kate Sable, MD Electrophysiologist:  None   Chief Complaint    Caitlin Lin is a 79 y.o. female with a hx of HTN, HLD, aortic regurgitation, mitral regurgitation, aortic atherosclerosis, HLD, GERD  presents today for cardiovascular clearance  Past Medical History    Past Medical History:  Diagnosis Date  . Cataracts, bilateral   . Chronic kidney disease    Followed by Dr. Holley Raring  . Chronic kidney disease   . Colon polyp   . Constipation due to slow transit   . Diarrhea   . Diarrhea   . GERD (gastroesophageal reflux disease)   . Heart murmur   . History of cyst of breast   . History of hiatal hernia   . Hyperlipidemia   . Hypertension   . Hypothyroidism   . Melanoma of lower leg ([place]) 2013  . Shingles 2013  . Shingles   . Spinal stenosis in cervical region    Dr. Sharlet Salina  . Subdural hematoma ([place])   . Thyroid disease    Past Surgical History:  Procedure Laterality Date  . ABDOMINAL HYSTERECTOMY  1974  . ANTERIOR AND POSTERIOR VAGINAL REPAIR    . ANTERIOR AND POSTERIOR VAGINAL REPAIR W/ SACROSPINOUS LIGAMENT SUSPENSION Right   . BREAST BIOPSY  1963   Benign per pt's report  . COLONOSCOPY    . ELECTROMAGNETIC NAVIGATION BROCHOSCOPY Right 01/09/2019   Procedure: ELECTROMAGNETIC NAVIGATION BRONCHOSCOPY;  Surgeon: Tyler Pita, MD;  Location: [facility] ORS;  Service: Cardiopulmonary;  Laterality: Right;  . ESOPHAGOGASTRODUODENOSCOPY    . ESOPHAGOGASTRODUODENOSCOPY (EGD) WITH PROPOFOL N/A 11/10/2016   Procedure: ESOPHAGOGASTRODUODENOSCOPY (EGD) WITH PROPOFOL;  Surgeon: Lollie Sails, MD;  Location: [facility] ENDOSCOPY;  Service: Endoscopy;  Laterality: N/A;  . ESOPHAGOGASTRODUODENOSCOPY (EGD) WITH PROPOFOL N/A 09/09/2018   Procedure: ESOPHAGOGASTRODUODENOSCOPY (EGD) WITH PROPOFOL;  Surgeon: Lollie Sails, MD;   Location: [facility] ENDOSCOPY;  Service: Endoscopy;  Laterality: N/A;  . LUNG BIOPSY  2007   Benign per pt's report  . LUNG BIOPSY Right   . OOPHORECTOMY  1970  . PERINEOPLASTY  12/17/2013  . sling for stress incontinence  12/18/2011    Allergies  Allergies  Allergen Reactions  . Quinapril Other (See Comments)    Hyperkalemia  . Codeine Rash    History of Present Illness    Caitlin Lin is a 80 y.o. female with a hx of HTN, HLD, aortic regurgitation, mitral regurgitation, aortic atherosclerosis, HLD, GERD last seen 01/10/2019 by Dr. Garen Lah.  Initially seen in 2018 by Dr. Yvone Neu with echo at that time revealing mild aortic and mitral regurgitation with normal LVEF.  She was seen October 2020 by Dr. Garen Lah and feeling well.  Repeat echo 12/2018 with LVEF 60 to 65%, RV normal size and function, mild dilation of the ascending aorta measuring 37 mm, mildly elevated PASP, mild aortic regurgitation with no evidence of sclerosis or stenosis, no mitral valve regurgitation.  She was recommended for follow-up echo and office visit in 18 months.  She had a CT chest without contrast 08/28/2019 with cardiovascular findings notable for normal heart size, aortic atherosclerosis, atherosclerosis of the LM and LAD.  Patient called our office 09/04/2019 to rest clearance for EGD to be performed by [facility] gastroenterology Dr. Bary Castilla.  She reports feeling overall well.  Enjoyed keeping her great granddaughter who is 65 years old  a couple of weeks ago.  She reports no chest pain, pressure, tightness.  She reports no shortness of breath at rest.  She does endorse needing an occasional deep breath when she does more than usual activity.  Endorses intermittent lower extremity edema which she attributes to keeping her feet in a dependent position.  This is relieved by elevating her lower extremities.  She does watch her sodium intake due to her kidney disease.  We reviewed likely etiology of venous  insufficiency.  We reviewed her echocardiogram in October which showed normal pumping function. We discussed her CT chest that showed coronary artery calcification.  Reviewed signs and symptoms of angina which she does not report.  Reviewed plan to continue aspirin and statin.  She reports her home blood pressure at home is routinely 120s over 70s and she checks it daily.  EKGs/Labs/Other Studies Reviewed:   The following studies were reviewed today: CT 08/28/19 MPRESSION: 1. No acute findings are noted in the abdomen or pelvis to account for the patient's symptoms. 2. Scattered areas of clustered nodularity in the lungs bilaterally, stable compared to prior examinations, favored to be related to a chronic indolent atypical infectious process. 3. Colonic diverticulosis without evidence of acute diverticulitis at this time. 4. Aortic atherosclerosis, in addition to left main and left anterior descending coronary artery disease. Assessment for potential risk factor modification, dietary therapy or pharmacologic therapy may be warranted, if clinically indicated. 5. There are calcifications of the mitral annulus. Echocardiographic correlation for evaluation of potential valvular dysfunction may be warranted if clinically indicated. 6. Small hiatal hernia.   Echo 01/05/2019   1. Left ventricular ejection fraction, by visual estimation, is 60 to 65%. The left ventricle has normal function. Normal left ventricular size. There is no left ventricular hypertrophy.  2. Global right ventricle has normal systolic function.The right ventricular size is normal. No increase in right ventricular wall thickness.  3. Left atrial size was normal.  4. There is mild dilatation of the ascending aorta measuring 37 mm.  5. Mildly elevated pulmonary artery systolic pressure.  6. Left ventricular diastolic Doppler parameters are consistent with impaired relaxation pattern of LV diastolic filling. Left Atrium:  Left atrial size was normal in size.   Right Atrium: Right atrial size was normal in size   Pericardium: There is no evidence of pericardial effusion.   Mitral Valve: The mitral valve is normal in structure. No evidence of mitral valve stenosis by observation. No evidence of mitral valve regurgitation.   Tricuspid Valve: The tricuspid valve is normal in structure. Tricuspid valve regurgitation is mild by color flow Doppler.   Aortic Valve: The aortic valve was not well visualized. Aortic valve regurgitation is mild by color flow Doppler. Aortic regurgitation PHT measures 501 msec. The aortic valve is structurally normal, with no evidence of sclerosis or stenosis. Aortic valve  mean gradient measures 4.0 mmHg. Aortic valve peak gradient measures 7.5 mmHg. Aortic valve area, by VTI measures 2.06 cm.   Pulmonic Valve: The pulmonic valve was normal in structure. Pulmonic valve regurgitation is not visualized by color flow Doppler.   Aorta: The aortic root, ascending aorta and aortic arch are all structurally normal, with no evidence of dilitation or obstruction. There is mild dilatation of the ascending aorta measuring 37 mm.   Echo 06/16/2016 Study Conclusions   - Left ventricle: The cavity size was normal. Systolic function was   normal. The estimated ejection fraction was in the range of 60%   to  65%. Wall motion was normal; there were no regional wall   motion abnormalities. Doppler parameters are consistent with   abnormal left ventricular relaxation (grade 1 diastolic   dysfunction). - Aortic valve: There was mild regurgitation. - Mitral valve: Calcified annulus. There was mild regurgitation. - Left atrium: The atrium was normal in size. - Right ventricle: Systolic function was normal. - Pulmonary arteries: Systolic pressure was within the normal   range.  EKG:  EKG is ordered today.  The ekg ordered today demonstrates normal sinus rhythm 83 bpm with low voltage QRS and no acute  ST/T wave changes -stable compared to previous Recent Labs: 07/31/2019: ALT 22; BUN 27; Creatinine, Ser 1.66; Hemoglobin 13.0; Platelets 167.0; Potassium 4.3; Sodium 137  Recent Lipid Panel    Component Value Date/Time   CHOL 153 12/07/2018 1044   TRIG 197.0 (H) 12/07/2018 1044   HDL 38.80 (L) 12/07/2018 1044   CHOLHDL 4 12/07/2018 1044   VLDL 39.4 12/07/2018 1044   LDLCALC 74 12/07/2018 1044   LDLDIRECT 84.0 07/22/2018 0839    Home Medications   Current Meds  Medication Sig  . amLODipine (NORVASC) 5 MG tablet Take 5 mg by mouth daily.  Marland Kitchen aspirin 81 MG tablet Take 1 tablet (81 mg total) by mouth daily. (Patient taking differently: Take 81 mg by mouth at bedtime. )  . atorvastatin (LIPITOR) 20 MG tablet Take 1 tablet by mouth once daily  . calcium carbonate (OSCAL) 1500 (600 Ca) MG TABS tablet Take 600 mg by mouth daily with breakfast.  . Cholecalciferol 25 MCG (1000 UT) capsule Take 2,000 Units by mouth daily.   . Cranberry 400 MG TABS Take 400 mg by mouth daily.  Marland Kitchen docusate sodium (COLACE) 100 MG capsule Take 100 mg by mouth at bedtime.  Arna Medici 75 MCG tablet Take 1 tablet by mouth once daily with breakfast  . fexofenadine (ALLEGRA) 180 MG tablet Take 180 mg by mouth daily as needed for allergies or rhinitis.  Marland Kitchen glucosamine-chondroitin 500-400 MG tablet Take 1 tablet by mouth 3 (three) times daily. (Patient taking differently: Take 2 tablets by mouth daily. )  . losartan (COZAAR) 50 MG tablet Take 1 tablet by mouth once daily  . metoCLOPramide (REGLAN) 5 MG tablet Take 5 mg by mouth 2 (two) times daily.  . Multiple Vitamin (MULTIVITAMIN) tablet Take 1 tablet by mouth daily.  . Omega-3 Fatty Acids (FISH OIL) 1000 MG CAPS Take 1,000 mg by mouth daily.  . pantoprazole (PROTONIX) 40 MG tablet Take 40 mg by mouth daily.  . Probiotic Product (PROBIOTIC DAILY PO) Take by mouth daily.    Review of Systems   Review of Systems  Constitutional: Negative for chills, fever and  malaise/fatigue.  Cardiovascular: Negative for chest pain, dyspnea on exertion, leg swelling, near-syncope, orthopnea, palpitations and syncope.  Respiratory: Negative for cough, shortness of breath and wheezing.   Gastrointestinal: Negative for nausea and vomiting.  Neurological: Negative for dizziness, light-headedness and weakness.   All other systems reviewed and are otherwise negative except as noted above.  Physical Exam    VS:  BP (!) 148/74 (BP Location: Left Arm, Patient Position: Sitting, Cuff Size: Normal)   Pulse 83   Ht 5\' 4"  (1.626 m)   Wt 159 lb 6 oz (72.3 kg)   SpO2 98%   BMI 27.36 kg/m  , BMI Body mass index is 27.36 kg/m. GEN: Well nourished, well developed, in no acute distress. HEENT: normal. Neck: Supple, no JVD, carotid bruits, or  masses. Cardiac: RRR, no murmurs, rubs, or gallops. No clubbing, cyanosis, edema.  Radials/DP/PT 2+ and equal bilaterally.  Respiratory:  Respirations regular and unlabored, clear to auscultation bilaterally. GI: Soft, nontender, nondistended, BS + x 4. MS: No deformity or atrophy. Skin: Warm and dry, no rash. Neuro:  Strength and sensation are intact. Psych: Normal affect.  Assessment & Plan    1. Cardiovascular clearance -upcoming EGD with GI.  Echo 12/2018 with mild AI and normal LVEF.  CT scan 6-20 21 with coronary calcification aortic atherosclerosis.  She is managed on aspirin and statin.  She reports no anginal symptoms, no indication for ischemic evaluation this time. According to the Revised Cardiac Risk Index (RCRI), her Perioperative Risk of Major Cardiac Event is (%): 0.9. Her Functional Capacity in METs is: 5.62 according to the [place] Activity Status Index (DASI).  She is deemed acceptable risk for the upcoming procedure without additional cardiovascular testing.  She may hold aspirin at the direction of her gastroenterologist.  This note will be forwarded to GI team via [facility] fax function.  2. Mild aortic regurgitation by  echo 12/2018 -continue optimal BP control.  No murmur noted on exam.  Plan for repeat echo in approx 12-18 months from last echo.  3. Mild dilation of ascending aorta 37 mm by echo 12/2018 - Continue optimal blood pressure control.  4. Venous insufficiency -she reports mild intermittent lower extremity edema.  Reviewed echo 12/2018 with normal LVEF.  Likely etiology venous insufficiency.  Encouraged to continue to elevate her lower extremities, a low-sodium diet.  Educated to utilize compression stockings as needed.  No indication for diuretic at this time.  5. HTN -BP well controlled on home monitoring.  She will report BP consistently greater than 130/80.  Continue amlodipine 5 mg daily, losartan 50 mg daily.  6. HLD -12/07/2018 lipid panel total cholesterol 153, HDL 38, LDL 74, triglycerides 187.  Continue atorvastatin 20 mg daily per PCP.  7. Coronary calcification on CT/aortic atherosclerosis - She reports no symptoms concerning for angina.  No indication for ischemic evaluation at this time.  Continue aspirin, statin.  8. GERD -continue to follow with primary care provider.  Plan for upcoming EGD, as above.  Disposition: Follow up April 2022 with Dr. Justice Deeds, NP 09/15/2019, 8:29 AM

## 2019-09-18 ENCOUNTER — Other Ambulatory Visit: Payer: Self-pay | Admitting: Internal Medicine

## 2019-09-19 ENCOUNTER — Ambulatory Visit (INDEPENDENT_AMBULATORY_CARE_PROVIDER_SITE_OTHER): Payer: PPO | Admitting: Internal Medicine

## 2019-09-19 ENCOUNTER — Other Ambulatory Visit: Payer: Self-pay

## 2019-09-19 DIAGNOSIS — E039 Hypothyroidism, unspecified: Secondary | ICD-10-CM | POA: Diagnosis not present

## 2019-09-19 DIAGNOSIS — K760 Fatty (change of) liver, not elsewhere classified: Secondary | ICD-10-CM | POA: Diagnosis not present

## 2019-09-19 DIAGNOSIS — I10 Essential (primary) hypertension: Secondary | ICD-10-CM | POA: Diagnosis not present

## 2019-09-19 DIAGNOSIS — R1310 Dysphagia, unspecified: Secondary | ICD-10-CM | POA: Diagnosis not present

## 2019-09-19 DIAGNOSIS — D509 Iron deficiency anemia, unspecified: Secondary | ICD-10-CM

## 2019-09-19 DIAGNOSIS — I700 Atherosclerosis of aorta: Secondary | ICD-10-CM | POA: Diagnosis not present

## 2019-09-19 DIAGNOSIS — N184 Chronic kidney disease, stage 4 (severe): Secondary | ICD-10-CM | POA: Diagnosis not present

## 2019-09-19 DIAGNOSIS — E7800 Pure hypercholesterolemia, unspecified: Secondary | ICD-10-CM

## 2019-09-19 DIAGNOSIS — K219 Gastro-esophageal reflux disease without esophagitis: Secondary | ICD-10-CM

## 2019-09-19 DIAGNOSIS — R739 Hyperglycemia, unspecified: Secondary | ICD-10-CM

## 2019-09-19 NOTE — Progress Notes (Signed)
Patient ID: JAZMYNN PHO, female   DOB: Jul 31, 1940, 79 y.o.   MRN: [id number]   Subjective:    Patient ID: [name], female    DOB: 02-16-1941, 79 y.o.   MRN: [id number]  HPI This visit occurred during the SARS-CoV-2 public health emergency.  Safety protocols were in place, including screening questions prior to the visit, additional usage of staff PPE, and extensive cleaning of exam room while observing appropriate contact time as indicated for disinfecting solutions.  Patient here for a scheduled follow up. She recently saw cardiology 09/15/19 for clearance for EGD.  No chest pain.  Deemed acceptable risk for upcoming EGD.  Also f/u regarding mild AR with mild dilatation of ascending aorta.  Stable.  She is also seeing GI for dysphagia - associated with early satiety, epigastric pain and nausea without vomiting.  Taking protonix and reglan. protonix increased to bid.  Recent CT unrevealing.  Planning for EGD.  Does feel better.  Still with minimal nausea.  Breathing stable. No increased cough or congestion.  Bowels moving.     Past Medical History:  Diagnosis Date  . Cataracts, bilateral   . Chronic kidney disease    Followed by Dr. Holley Raring  . Chronic kidney disease   . Colon polyp   . Constipation due to slow transit   . Diarrhea   . Diarrhea   . GERD (gastroesophageal reflux disease)   . Heart murmur   . History of cyst of breast   . History of hiatal hernia   . Hyperlipidemia   . Hypertension   . Hypothyroidism   . Melanoma of lower leg ([place]) 2013  . Shingles 2013  . Shingles   . Spinal stenosis in cervical region    Dr. Sharlet Salina  . Subdural hematoma ([place])   . Thyroid disease    Past Surgical History:  Procedure Laterality Date  . ABDOMINAL HYSTERECTOMY  1974  . ANTERIOR AND POSTERIOR VAGINAL REPAIR    . ANTERIOR AND POSTERIOR VAGINAL REPAIR W/ SACROSPINOUS LIGAMENT SUSPENSION Right   . BREAST BIOPSY  1963   Benign per pt's report  . COLONOSCOPY    .  ELECTROMAGNETIC NAVIGATION BROCHOSCOPY Right 01/09/2019   Procedure: ELECTROMAGNETIC NAVIGATION BRONCHOSCOPY;  Surgeon: Tyler Pita, MD;  Location: [facility] ORS;  Service: Cardiopulmonary;  Laterality: Right;  . ESOPHAGOGASTRODUODENOSCOPY    . ESOPHAGOGASTRODUODENOSCOPY (EGD) WITH PROPOFOL N/A 11/10/2016   Procedure: ESOPHAGOGASTRODUODENOSCOPY (EGD) WITH PROPOFOL;  Surgeon: Lollie Sails, MD;  Location: [facility] ENDOSCOPY;  Service: Endoscopy;  Laterality: N/A;  . ESOPHAGOGASTRODUODENOSCOPY (EGD) WITH PROPOFOL N/A 09/09/2018   Procedure: ESOPHAGOGASTRODUODENOSCOPY (EGD) WITH PROPOFOL;  Surgeon: Lollie Sails, MD;  Location: [facility] ENDOSCOPY;  Service: Endoscopy;  Laterality: N/A;  . LUNG BIOPSY  2007   Benign per pt's report  . LUNG BIOPSY Right   . OOPHORECTOMY  1970  . PERINEOPLASTY  12/17/2013  . sling for stress incontinence  12/18/2011   Family History  Problem Relation Age of Onset  . Arthritis Mother   . Liver disease Mother   . Cirrhosis Mother   . Liver cancer Mother   . Heart disease Father   . Heart attack Father   . Coronary artery disease Father   . Depression Brother   . Breast cancer Other   . Breast cancer Cousin        maternal cousins  . Liver cancer Cousin   . Diabetes Maternal Aunt    Social History   Socioeconomic History  . Marital  status: Married    Spouse name: Not on file  . Number of children: 2  . Years of education: Not on file  . Highest education level: Not on file  Occupational History  . Occupation: Retired  Tobacco Use  . Smoking status: Never Smoker  . Smokeless tobacco: Never Used  Vaping Use  . Vaping Use: Never used  Substance and Sexual Activity  . Alcohol use: No  . Drug use: No  . Sexual activity: Yes  Other Topics Concern  . Not on file  Social History Narrative   Lives in [place] with husband. Has 2 children boy and girl, 4 grandchildren. Retired Presenter, broadcasting.       Daily Caffeine Use:  NO   Regular Exercise -  Not  at this time due to back pain, would like to resume soon   Social Determinants of Health   Financial Resource Strain:   . Difficulty of Paying Living Expenses:   Food Insecurity:   . Worried About Charity fundraiser in the Last Year:   . Arboriculturist in the Last Year:   Transportation Needs:   . Film/video editor (Medical):   Marland Kitchen Lack of Transportation (Non-Medical):   Physical Activity:   . Days of Exercise per Week:   . Minutes of Exercise per Session:   Stress:   . Feeling of Stress :   Social Connections: Socially Integrated  . Frequency of Communication with Friends and Family: Three times a week  . Frequency of Social Gatherings with Friends and Family: Once a week  . Attends Religious Services: More than 4 times per year  . Active Member of Clubs or Organizations: Yes  . Attends Archivist Meetings: More than 4 times per year  . Marital Status: Married    Outpatient Encounter Medications as of 09/19/2019  Medication Sig  . amLODipine (NORVASC) 5 MG tablet Take 5 mg by mouth daily.  Marland Kitchen aspirin 81 MG tablet Take 1 tablet (81 mg total) by mouth daily. (Patient taking differently: Take 81 mg by mouth at bedtime. )  . atorvastatin (LIPITOR) 20 MG tablet Take 1 tablet by mouth once daily  . calcium carbonate (OSCAL) 1500 (600 Ca) MG TABS tablet Take 600 mg by mouth daily with breakfast.  . Cholecalciferol 25 MCG (1000 UT) capsule Take 2,000 Units by mouth daily.   . Cranberry 400 MG TABS Take 400 mg by mouth daily.  Marland Kitchen docusate sodium (COLACE) 100 MG capsule Take 100 mg by mouth at bedtime.  Arna Medici 75 MCG tablet Take 1 tablet by mouth once daily with breakfast  . fexofenadine (ALLEGRA) 180 MG tablet Take 180 mg by mouth daily as needed for allergies or rhinitis.  Marland Kitchen glucosamine-chondroitin 500-400 MG tablet Take 1 tablet by mouth 3 (three) times daily. (Patient taking differently: Take 2 tablets by mouth daily. )  . losartan (COZAAR) 50 MG tablet Take 1 tablet  by mouth once daily  . metoCLOPramide (REGLAN) 5 MG tablet Take 5 mg by mouth 2 (two) times daily.  . Multiple Vitamin (MULTIVITAMIN) tablet Take 1 tablet by mouth daily.  . Omega-3 Fatty Acids (FISH OIL) 1000 MG CAPS Take 1,000 mg by mouth daily.  . pantoprazole (PROTONIX) 40 MG tablet Take 40 mg by mouth daily.  . Probiotic Product (PROBIOTIC DAILY PO) Take by mouth daily.   No facility-administered encounter medications on file as of 09/19/2019.    Review of Systems  Constitutional: Negative for appetite  change and unexpected weight change.  HENT: Negative for congestion and sinus pressure.   Respiratory: Negative for cough, chest tightness and shortness of breath.   Cardiovascular: Negative for chest pain, palpitations and leg swelling.  Gastrointestinal: Positive for nausea. Negative for diarrhea and vomiting.       No increased abdominal pain.    Genitourinary: Negative for difficulty urinating and dysuria.  Musculoskeletal: Negative for joint swelling and myalgias.  Skin: Negative for color change and rash.  Neurological: Negative for dizziness, light-headedness and headaches.  Psychiatric/Behavioral: Negative for agitation and dysphoric mood.       Objective:    Physical Exam Constitutional:      General: She is not in acute distress.    Appearance: Normal appearance.  HENT:     Head: Normocephalic and atraumatic.     Right Ear: External ear normal.     Left Ear: External ear normal.  Eyes:     General: No scleral icterus.       Right eye: No discharge.        Left eye: No discharge.     Conjunctiva/sclera: Conjunctivae normal.  Neck:     Thyroid: No thyromegaly.  Cardiovascular:     Rate and Rhythm: Normal rate and regular rhythm.  Pulmonary:     Effort: No respiratory distress.     Breath sounds: Normal breath sounds. No wheezing.  Abdominal:     General: Bowel sounds are normal.     Palpations: Abdomen is soft.     Tenderness: There is no abdominal  tenderness.  Musculoskeletal:        General: No swelling or tenderness.     Cervical back: Neck supple. No tenderness.  Lymphadenopathy:     Cervical: No cervical adenopathy.  Skin:    Findings: No erythema or rash.  Neurological:     Mental Status: She is alert.  Psychiatric:        Mood and Affect: Mood normal.        Behavior: Behavior normal.     BP 128/70   Pulse 75   Temp 97.7 F (36.5 C)   Resp 16   Ht '5\' 4"'  (1.626 m)   Wt 162 lb (73.5 kg)   SpO2 98%   BMI 27.81 kg/m  Wt Readings from Last 3 Encounters:  09/21/19 160 lb 12.8 oz (72.9 kg)  09/19/19 162 lb (73.5 kg)  09/15/19 159 lb 6 oz (72.3 kg)     Lab Results  Component Value Date   WBC 4.7 07/31/2019   HGB 13.0 07/31/2019   HCT 38.6 07/31/2019   PLT 167.0 07/31/2019   GLUCOSE 99 07/31/2019   CHOL 153 12/07/2018   TRIG 197.0 (H) 12/07/2018   HDL 38.80 (L) 12/07/2018   LDLDIRECT 84.0 07/22/2018   LDLCALC 74 12/07/2018   ALT 22 07/31/2019   AST 30 07/31/2019   NA 137 07/31/2019   K 4.3 07/31/2019   CL 106 07/31/2019   CREATININE 1.66 (H) 07/31/2019   BUN 27 (H) 07/31/2019   CO2 24 07/31/2019   TSH 3.55 07/22/2018   HGBA1C 5.8 12/07/2018   MICROALBUR 2.1 (H) 03/27/2015    CT Abdomen Pelvis Wo Contrast  Result Date: 08/28/2019 CLINICAL DATA:  79 year old female with history of right upper quadrant abdominal pain. EXAM: CT CHEST, ABDOMEN AND PELVIS WITHOUT CONTRAST TECHNIQUE: Multidetector CT imaging of the chest, abdomen and pelvis was performed following the standard protocol without IV contrast. COMPARISON:  Chest CT 03/01/2019. CT the  abdomen and pelvis 08/03/2018. FINDINGS: CT CHEST FINDINGS Cardiovascular: Heart size is normal. There is no significant pericardial fluid, thickening or pericardial calcification. There is aortic atherosclerosis, as well as atherosclerosis of the great vessels of the mediastinum and the coronary arteries, including calcified atherosclerotic plaque in the left main  and left anterior descending coronary arteries. Calcifications of the mitral annulus. Mediastinum/Nodes: No pathologically enlarged mediastinal or hilar lymph nodes. Please note that accurate exclusion of hilar adenopathy is limited on noncontrast CT scans. Small hiatal hernia. No axillary lymphadenopathy. Lungs/Pleura: Regional area of architectural distortion with nodularity and chronic calcifications in the posterior aspect of the right upper lobe appears less bulky than the prior examination, with the largest residual area of nodularity in this region currently measuring 9 x 6 mm (axial image 52 of series 3). Clustered nodularity in the anterolateral aspect of the left upper lobe is unchanged, with the largest of these nodules measuring up to 8 mm (axial image 63 of series 3). Several other scattered tiny 2-3 mm pulmonary nodules are stable compared to prior examinations. No acute consolidative airspace disease. No pleural effusions. Musculoskeletal: In the T12 vertebral body there is a well-defined 1.8 x 1.6 cm lucent lesion with some vertical trabeculations, similar to the prior study, compatible with a cavernous hemangioma. There are no aggressive appearing lytic or blastic lesions noted in the visualized portions of the skeleton. CT ABDOMEN PELVIS FINDINGS Hepatobiliary: No definite suspicious cystic or solid hepatic lesions are confidently identified on today's noncontrast CT examination. Unenhanced appearance of the gallbladder is unremarkable. Specifically, no calcified gallstones. Pancreas: No definite pancreatic mass or peripancreatic fluid collections or inflammatory changes are noted on today's noncontrast CT examination. Spleen: Unremarkable. Adrenals/Urinary Tract: Bilateral kidneys and adrenal glands are normal in appearance. No hydroureteronephrosis. Small amount of gas non dependently in the lumen of the urinary bladder. Urinary bladder is otherwise normal in appearance. Stomach/Bowel: Unenhanced  appearance of the stomach is normal. Small periampullary duodenal diverticulum without surrounding inflammatory changes. No pathologic dilatation of small bowel or colon. Numerous colonic diverticulae are noted, particularly in the sigmoid colon, without surrounding inflammatory changes to suggest an acute diverticulitis at this time. The appendix is not confidently identified and may be surgically absent. Regardless, there are no inflammatory changes noted adjacent to the cecum to suggest the presence of an acute appendicitis at this time. Vascular/Lymphatic: Aortic atherosclerosis. No lymphadenopathy noted in the abdomen or pelvis. Reproductive: Status post hysterectomy. Ovaries are not confidently identified may be surgically absent or atrophic. Other: No significant volume of ascites.  No pneumoperitoneum. Musculoskeletal: There are no aggressive appearing lytic or blastic lesions noted in the visualized portions of the skeleton. IMPRESSION: 1. No acute findings are noted in the abdomen or pelvis to account for the patient's symptoms. 2. Scattered areas of clustered nodularity in the lungs bilaterally, stable compared to prior examinations, favored to be related to a chronic indolent atypical infectious process. 3. Colonic diverticulosis without evidence of acute diverticulitis at this time. 4. Aortic atherosclerosis, in addition to left main and left anterior descending coronary artery disease. Assessment for potential risk factor modification, dietary therapy or pharmacologic therapy may be warranted, if clinically indicated. 5. There are calcifications of the mitral annulus. Echocardiographic correlation for evaluation of potential valvular dysfunction may be warranted if clinically indicated. 6. Small hiatal hernia. Electronically Signed   By: Vinnie Langton M.D.   On: 08/28/2019 10:07   CT Chest Wo Contrast  Result Date: 08/28/2019 CLINICAL DATA:  79 year old female  with history of right upper  quadrant abdominal pain. EXAM: CT CHEST, ABDOMEN AND PELVIS WITHOUT CONTRAST TECHNIQUE: Multidetector CT imaging of the chest, abdomen and pelvis was performed following the standard protocol without IV contrast. COMPARISON:  Chest CT 03/01/2019. CT the abdomen and pelvis 08/03/2018. FINDINGS: CT CHEST FINDINGS Cardiovascular: Heart size is normal. There is no significant pericardial fluid, thickening or pericardial calcification. There is aortic atherosclerosis, as well as atherosclerosis of the great vessels of the mediastinum and the coronary arteries, including calcified atherosclerotic plaque in the left main and left anterior descending coronary arteries. Calcifications of the mitral annulus. Mediastinum/Nodes: No pathologically enlarged mediastinal or hilar lymph nodes. Please note that accurate exclusion of hilar adenopathy is limited on noncontrast CT scans. Small hiatal hernia. No axillary lymphadenopathy. Lungs/Pleura: Regional area of architectural distortion with nodularity and chronic calcifications in the posterior aspect of the right upper lobe appears less bulky than the prior examination, with the largest residual area of nodularity in this region currently measuring 9 x 6 mm (axial image 52 of series 3). Clustered nodularity in the anterolateral aspect of the left upper lobe is unchanged, with the largest of these nodules measuring up to 8 mm (axial image 63 of series 3). Several other scattered tiny 2-3 mm pulmonary nodules are stable compared to prior examinations. No acute consolidative airspace disease. No pleural effusions. Musculoskeletal: In the T12 vertebral body there is a well-defined 1.8 x 1.6 cm lucent lesion with some vertical trabeculations, similar to the prior study, compatible with a cavernous hemangioma. There are no aggressive appearing lytic or blastic lesions noted in the visualized portions of the skeleton. CT ABDOMEN PELVIS FINDINGS Hepatobiliary: No definite suspicious  cystic or solid hepatic lesions are confidently identified on today's noncontrast CT examination. Unenhanced appearance of the gallbladder is unremarkable. Specifically, no calcified gallstones. Pancreas: No definite pancreatic mass or peripancreatic fluid collections or inflammatory changes are noted on today's noncontrast CT examination. Spleen: Unremarkable. Adrenals/Urinary Tract: Bilateral kidneys and adrenal glands are normal in appearance. No hydroureteronephrosis. Small amount of gas non dependently in the lumen of the urinary bladder. Urinary bladder is otherwise normal in appearance. Stomach/Bowel: Unenhanced appearance of the stomach is normal. Small periampullary duodenal diverticulum without surrounding inflammatory changes. No pathologic dilatation of small bowel or colon. Numerous colonic diverticulae are noted, particularly in the sigmoid colon, without surrounding inflammatory changes to suggest an acute diverticulitis at this time. The appendix is not confidently identified and may be surgically absent. Regardless, there are no inflammatory changes noted adjacent to the cecum to suggest the presence of an acute appendicitis at this time. Vascular/Lymphatic: Aortic atherosclerosis. No lymphadenopathy noted in the abdomen or pelvis. Reproductive: Status post hysterectomy. Ovaries are not confidently identified may be surgically absent or atrophic. Other: No significant volume of ascites.  No pneumoperitoneum. Musculoskeletal: There are no aggressive appearing lytic or blastic lesions noted in the visualized portions of the skeleton. IMPRESSION: 1. No acute findings are noted in the abdomen or pelvis to account for the patient's symptoms. 2. Scattered areas of clustered nodularity in the lungs bilaterally, stable compared to prior examinations, favored to be related to a chronic indolent atypical infectious process. 3. Colonic diverticulosis without evidence of acute diverticulitis at this time. 4.  Aortic atherosclerosis, in addition to left main and left anterior descending coronary artery disease. Assessment for potential risk factor modification, dietary therapy or pharmacologic therapy may be warranted, if clinically indicated. 5. There are calcifications of the mitral annulus. Echocardiographic correlation for evaluation  of potential valvular dysfunction may be warranted if clinically indicated. 6. Small hiatal hernia. Electronically Signed   By: Vinnie Langton M.D.   On: 08/28/2019 10:07       Assessment & Plan:   Problem List Items Addressed This Visit    Anemia, iron deficiency    Follow cbc.       Aortic atherosclerosis (HCC)    Continue lipitor.        CKD (chronic kidney disease) stage 4, GFR 15-29 ml/min (HCC)    Followed by nephrology.       Dysphagia    Seeing GI.  Planning for EGD. On reglan and protonix.       Fatty liver    Diet and exercise.  Follow liver function tests.        GERD (gastroesophageal reflux disease)    On protonix bid now.  Does feel some better.  Minimal nausea persists.  Saw GI recently for f/u dysphagia.  Planning for EGD.        Hypercholesterolemia    On lipitor.  Low cholesterol diet and exercise.  Follow lipid panel and liver function tests.       Relevant Orders   Hepatic function panel   Lipid panel   Hyperglycemia    Low carb diet and exercise.  Follow met b and a1c.        Relevant Orders   Hemoglobin A1c   Hypertension (Chronic)    Blood pressure as outlined.  Continue amlodipine and losartan.  Follow pressures.  Follow metabolic panel.       Relevant Orders   CBC with Differential/Platelet   Basic metabolic panel   Hypothyroidism (Chronic)    On thyroid replacement.  Follow tsh.       Relevant Orders   TSH       Einar Pheasant, MD

## 2019-09-21 ENCOUNTER — Other Ambulatory Visit: Payer: Self-pay

## 2019-09-21 ENCOUNTER — Ambulatory Visit: Payer: PPO | Admitting: Pulmonary Disease

## 2019-09-21 ENCOUNTER — Encounter: Payer: Self-pay | Admitting: Pulmonary Disease

## 2019-09-21 VITALS — BP 126/78 | HR 83 | Temp 98.0°F | Ht 64.0 in | Wt 160.8 lb

## 2019-09-21 DIAGNOSIS — K449 Diaphragmatic hernia without obstruction or gangrene: Secondary | ICD-10-CM

## 2019-09-21 DIAGNOSIS — Z9889 Other specified postprocedural states: Secondary | ICD-10-CM

## 2019-09-21 DIAGNOSIS — R918 Other nonspecific abnormal finding of lung field: Secondary | ICD-10-CM

## 2019-09-21 DIAGNOSIS — K219 Gastro-esophageal reflux disease without esophagitis: Secondary | ICD-10-CM

## 2019-09-21 DIAGNOSIS — Z85820 Personal history of malignant melanoma of skin: Secondary | ICD-10-CM

## 2019-09-21 NOTE — Patient Instructions (Signed)
·   Your CT scan looks markedly better.  As we discussed this is likely a slow-growing infection that usually produces small scars in the lung.  Treating something like this will be very toxic and we usually reserve it only if you have symptoms or if your lung has signs of destruction it does not at this point.  We will see you in follow-up in 6 months time.  Please call us if you develop any issues with night sweats and fevers, weight loss, loss of appetite or coughing up blood.

## 2019-09-21 NOTE — Progress Notes (Signed)
    Assessment & Plan:  1. Lung nodules (Primary)  2. Gastroesophageal reflux disease with hiatal hernia  3. History of melanoma excision   Patient Instructions  Your CT scan looks markedly better. As we discussed this is likely a slow-growing infection that usually produces small scars in the lung. Treating something like this will be very toxic and we usually reserve it only if you have symptoms or if your lung has signs of destruction it does not at this point. We will see you in follow-up in 6 months time.  Please call us  if you develop any issues with night sweats and fevers, weight loss, loss of appetite or coughing up blood.   Please note: late entry documentation due to logistical difficulties during COVID-19 pandemic. This note is filed for information purposes only, and is not intended to be used for billing, nor does it represent the full scope/nature of the visit in question. Please see any associated scanned media linked to date of encounter for additional pertinent information.  Subjective:    HPI: Caitlin Lin is a 79 y.o. female presenting to the pulmonology clinic on 09/21/2019 with report of: Follow-up (CT 08/28/2019-- no current sx. )     Outpatient Encounter Medications as of 09/21/2019  Medication Sig   aspirin  81 MG tablet Take 1 tablet (81 mg total) by mouth daily.   Multiple Vitamin (MULTIVITAMIN) tablet Take 1 tablet by mouth daily.   Omega-3 Fatty Acids (FISH OIL ) 1000 MG CAPS Take 1,000 mg by mouth daily.   [DISCONTINUED] amLODipine  (NORVASC ) 5 MG tablet Take 5 mg by mouth daily.   [DISCONTINUED] atorvastatin  (LIPITOR) 20 MG tablet Take 1 tablet by mouth once daily   [DISCONTINUED] calcium  carbonate (OSCAL) 1500 (600 Ca) MG TABS tablet Take 600 mg by mouth daily with breakfast.   [DISCONTINUED] Cholecalciferol  25 MCG (1000 UT) capsule Take 2,000 Units by mouth daily.    [DISCONTINUED] Cranberry 400 MG TABS Take 400 mg by mouth daily. (Patient not  taking: Reported on 10/31/2021)   [DISCONTINUED] docusate sodium (COLACE) 100 MG capsule Take 100 mg by mouth at bedtime.   [DISCONTINUED] EUTHYROX  75 MCG tablet Take 1 tablet by mouth once daily with breakfast   [DISCONTINUED] fexofenadine  (ALLEGRA) 180 MG tablet Take 180 mg by mouth daily as needed for allergies or rhinitis. (Patient not taking: Reported on 10/26/2019)   [DISCONTINUED] glucosamine-chondroitin 500-400 MG tablet Take 1 tablet by mouth 3 (three) times daily. (Patient taking differently: Take 2 tablets by mouth daily. )   [DISCONTINUED] losartan  (COZAAR ) 50 MG tablet Take 1 tablet by mouth once daily   [DISCONTINUED] metoCLOPramide  (REGLAN ) 5 MG tablet Take 5 mg by mouth 2 (two) times daily.   [DISCONTINUED] pantoprazole  (PROTONIX ) 40 MG tablet Take 40 mg by mouth daily.   [DISCONTINUED] Probiotic Product (PROBIOTIC DAILY PO) Take by mouth daily. (Patient not taking: Reported on 04/23/2023)   No facility-administered encounter medications on file as of 09/21/2019.      Objective:   Vitals:   09/21/19 1404  BP: 126/78  Pulse: 83  Temp: 98 F (36.7 C)  Height: 5' 4 (1.626 m)  Weight: 160 lb 12.8 oz (72.9 kg)  SpO2: 97%  TempSrc: Temporal  BMI (Calculated): 27.59     Physical exam documentation is limited by delayed entry of information.

## 2019-09-24 ENCOUNTER — Encounter: Payer: Self-pay | Admitting: Internal Medicine

## 2019-09-24 NOTE — Assessment & Plan Note (Signed)
On thyroid replacement.  Follow tsh.  

## 2019-09-24 NOTE — Assessment & Plan Note (Signed)
Low carb diet and exercise.  Follow met b and a1c.   

## 2019-09-24 NOTE — Assessment & Plan Note (Signed)
Diet and exercise.  Follow liver function tests.   

## 2019-09-24 NOTE — Assessment & Plan Note (Signed)
Blood pressure as outlined.  Continue amlodipine and losartan.  Follow pressures.  Follow metabolic panel.

## 2019-09-24 NOTE — Assessment & Plan Note (Signed)
Follow cbc.  

## 2019-09-24 NOTE — Assessment & Plan Note (Signed)
Followed by nephrology. 

## 2019-09-24 NOTE — Assessment & Plan Note (Signed)
Continue lipitor  ?

## 2019-09-24 NOTE — Assessment & Plan Note (Signed)
On lipitor.  Low cholesterol diet and exercise.  Follow lipid panel and liver function tests.   

## 2019-09-24 NOTE — Assessment & Plan Note (Signed)
On protonix bid now.  Does feel some better.  Minimal nausea persists.  Saw GI recently for f/u dysphagia.  Planning for EGD.

## 2019-09-24 NOTE — Assessment & Plan Note (Signed)
Seeing GI.  Planning for EGD. On reglan and protonix.

## 2019-09-25 ENCOUNTER — Other Ambulatory Visit: Payer: Self-pay | Admitting: Internal Medicine

## 2019-09-25 DIAGNOSIS — Z1231 Encounter for screening mammogram for malignant neoplasm of breast: Secondary | ICD-10-CM

## 2019-10-02 ENCOUNTER — Telehealth: Payer: Self-pay | Admitting: Internal Medicine

## 2019-10-02 MED ORDER — PANTOPRAZOLE SODIUM 40 MG PO TBEC: 40.0000 mg | DELAYED_RELEASE_TABLET | Freq: Every day | ORAL | 1 refills | Status: DC

## 2019-10-02 NOTE — Telephone Encounter (Signed)
Pt needs a refill on  pantoprazole (PROTONIX) 40 MG tablet sent to [facility] She is taking 2- 40mg  a day

## 2019-10-25 ENCOUNTER — Other Ambulatory Visit: Payer: Self-pay

## 2019-10-25 ENCOUNTER — Other Ambulatory Visit
Admission: RE | Admit: 2019-10-25 | Discharge: 2019-10-25 | Disposition: A | Discharge status: Home / Self Care | Payer: PPO | Source: Ambulatory Visit | Attending: General Surgery | Admitting: General Surgery

## 2019-10-25 DIAGNOSIS — Z01812 Encounter for preprocedural laboratory examination: Secondary | ICD-10-CM | POA: Diagnosis not present

## 2019-10-25 DIAGNOSIS — Z20822 Contact with and (suspected) exposure to covid-19: Secondary | ICD-10-CM | POA: Insufficient documentation

## 2019-10-26 ENCOUNTER — Encounter: Payer: Self-pay | Admitting: General Surgery

## 2019-10-26 LAB — SARS CORONAVIRUS 2 (TAT 6-24 HRS): SARS Coronavirus 2: NEGATIVE

## 2019-10-27 ENCOUNTER — Ambulatory Visit
Admission: RE | Admit: 2019-10-27 | Discharge: 2019-10-27 | Disposition: A | Discharge status: Home / Self Care | Payer: PPO | Attending: General Surgery | Admitting: General Surgery

## 2019-10-27 ENCOUNTER — Ambulatory Visit: Payer: PPO | Admitting: Registered Nurse

## 2019-10-27 ENCOUNTER — Encounter
Admission: RE | Disposition: A | Discharge status: Home / Self Care | Payer: Self-pay | Source: Home / Self Care | Attending: General Surgery

## 2019-10-27 DIAGNOSIS — R1310 Dysphagia, unspecified: Secondary | ICD-10-CM | POA: Diagnosis not present

## 2019-10-27 DIAGNOSIS — Z888 Allergy status to other drugs, medicaments and biological substances status: Secondary | ICD-10-CM | POA: Insufficient documentation

## 2019-10-27 DIAGNOSIS — N189 Chronic kidney disease, unspecified: Secondary | ICD-10-CM | POA: Insufficient documentation

## 2019-10-27 DIAGNOSIS — K449 Diaphragmatic hernia without obstruction or gangrene: Secondary | ICD-10-CM | POA: Diagnosis not present

## 2019-10-27 DIAGNOSIS — I10 Essential (primary) hypertension: Secondary | ICD-10-CM | POA: Diagnosis not present

## 2019-10-27 DIAGNOSIS — R1013 Epigastric pain: Secondary | ICD-10-CM | POA: Diagnosis not present

## 2019-10-27 DIAGNOSIS — Z79899 Other long term (current) drug therapy: Secondary | ICD-10-CM | POA: Diagnosis not present

## 2019-10-27 DIAGNOSIS — K317 Polyp of stomach and duodenum: Secondary | ICD-10-CM | POA: Insufficient documentation

## 2019-10-27 DIAGNOSIS — Z85820 Personal history of malignant melanoma of skin: Secondary | ICD-10-CM | POA: Insufficient documentation

## 2019-10-27 DIAGNOSIS — E785 Hyperlipidemia, unspecified: Secondary | ICD-10-CM | POA: Insufficient documentation

## 2019-10-27 DIAGNOSIS — K219 Gastro-esophageal reflux disease without esophagitis: Secondary | ICD-10-CM | POA: Diagnosis not present

## 2019-10-27 DIAGNOSIS — I129 Hypertensive chronic kidney disease with stage 1 through stage 4 chronic kidney disease, or unspecified chronic kidney disease: Secondary | ICD-10-CM | POA: Diagnosis not present

## 2019-10-27 DIAGNOSIS — R011 Cardiac murmur, unspecified: Secondary | ICD-10-CM | POA: Diagnosis not present

## 2019-10-27 DIAGNOSIS — Z79890 Hormone replacement therapy (postmenopausal): Secondary | ICD-10-CM | POA: Insufficient documentation

## 2019-10-27 DIAGNOSIS — Z7982 Long term (current) use of aspirin: Secondary | ICD-10-CM | POA: Diagnosis not present

## 2019-10-27 DIAGNOSIS — Z885 Allergy status to narcotic agent status: Secondary | ICD-10-CM | POA: Diagnosis not present

## 2019-10-27 DIAGNOSIS — E039 Hypothyroidism, unspecified: Secondary | ICD-10-CM | POA: Insufficient documentation

## 2019-10-27 HISTORY — PX: ESOPHAGOGASTRODUODENOSCOPY (EGD) WITH PROPOFOL: SHX5813

## 2019-10-27 SURGERY — ESOPHAGOGASTRODUODENOSCOPY (EGD) WITH PROPOFOL
Anesthesia: General

## 2019-10-27 MED ORDER — PROPOFOL 500 MG/50ML IV EMUL
INTRAVENOUS | Status: DC | PRN
  Administered 2019-10-27: 150 ug/kg/min via INTRAVENOUS

## 2019-10-27 MED ORDER — LIDOCAINE HCL (PF) 2 % IJ SOLN
INTRAMUSCULAR | Status: AC
  Filled 2019-10-27: qty 5

## 2019-10-27 MED ORDER — PROPOFOL 10 MG/ML IV BOLUS
INTRAVENOUS | Status: DC | PRN
  Administered 2019-10-27: 70 mg via INTRAVENOUS

## 2019-10-27 MED ORDER — LIDOCAINE HCL (CARDIAC) PF 100 MG/5ML IV SOSY
PREFILLED_SYRINGE | INTRAVENOUS | Status: DC | PRN
  Administered 2019-10-27: 40 mg via INTRAVENOUS

## 2019-10-27 MED ORDER — SODIUM CHLORIDE 0.9 % IV SOLN
INTRAVENOUS | Status: DC
  Administered 2019-10-27: 20 mL/h via INTRAVENOUS

## 2019-10-27 MED ORDER — GLYCOPYRROLATE 0.2 MG/ML IJ SOLN
INTRAMUSCULAR | Status: DC | PRN
  Administered 2019-10-27: .2 mg via INTRAVENOUS

## 2019-10-27 MED ORDER — GLYCOPYRROLATE 0.2 MG/ML IJ SOLN
INTRAMUSCULAR | Status: AC
  Filled 2019-10-27: qty 1

## 2019-10-27 MED ORDER — PROPOFOL 500 MG/50ML IV EMUL
INTRAVENOUS | Status: AC
  Filled 2019-10-27: qty 50

## 2019-10-27 NOTE — Transfer of Care (Signed)
Immediate Anesthesia Transfer of Care Note  Patient: Caitlin Lin  Procedure(s) Performed: Procedure(s): ESOPHAGOGASTRODUODENOSCOPY (EGD) WITH PROPOFOL (N/A)  Patient Location: PACU and Endoscopy Unit  Anesthesia Type:General  Level of Consciousness: sedated  Airway & Oxygen Therapy: Patient Spontanous Breathing and Patient connected to nasal cannula oxygen  Post-op Assessment: Report given to RN and Post -op Vital signs reviewed and stable  Post vital signs: Reviewed and stable  Last Vitals:  Vitals:   10/27/19 0835 10/27/19 0903  BP: 134/74 134/73  Pulse: 72 87  Resp: 18 (!) 26  Temp: 36.4 C (!) 36.1 C  SpO2: 643% 329%    Complications: No apparent anesthesia complications

## 2019-10-27 NOTE — Op Note (Signed)
[facility] Patient Name: Caitlin Lin Procedure Date: 10/27/2019 8:34 AM MRN: [id number] Account #: [id number] Date of Birth: 12-21-1940 Admit Type: Outpatient Age: 79 Room: [facility] ENDO ROOM 1 Gender: Female Note Status: Finalized Procedure:             Upper GI endoscopy Indications:           Dysphagia Providers:             Robert Bellow, MD Referring MD:          Einar Pheasant, MD (Referring MD) Medicines:             Monitored Anesthesia Care Complications:         No immediate complications. Procedure:             Pre-Anesthesia Assessment:                        - Prior to the procedure, a History and Physical was                         performed, and patient medications, allergies and                         sensitivities were reviewed. The patient's tolerance                         of previous anesthesia was reviewed.                        - The risks and benefits of the procedure and the                         sedation options and risks were discussed with the                         patient. All questions were answered and informed                         consent was obtained.                        After obtaining informed consent, the endoscope was                         passed under direct vision. Throughout the procedure,                         the patient's blood pressure, pulse, and oxygen                         saturations were monitored continuously. The Endoscope                         was introduced through the mouth, and advanced to the                         second part of duodenum. The upper GI endoscopy was                         accomplished  without difficulty. The patient tolerated                         the procedure well. Findings:      The esophagus was normal.      The examined duodenum was normal.      A medium-sized hiatal hernia was present. The GEJ is at 30 cm, hiatus at       35 cm.       Multiple 5 mm sessile polyps with no bleeding and no stigmata of recent       bleeding were found in the gastric fundus and in the gastric body. Impression:            - Normal esophagus.                        - Normal examined duodenum.                        - Medium-sized hiatal hernia.                        - No specimens collected. Recommendation:        - Discharge patient to home (via wheelchair). Robert Bellow, MD 10/27/2019 9:01:34 AM This report has been signed electronically. Number of Addenda: 0 Note Initiated On: 10/27/2019 8:34 AM      [facility]

## 2019-10-27 NOTE — Anesthesia Postprocedure Evaluation (Signed)
Anesthesia Post Note  Patient: Caitlin Lin  Procedure(s) Performed: ESOPHAGOGASTRODUODENOSCOPY (EGD) WITH PROPOFOL (N/A )  Patient location during evaluation: Endoscopy Anesthesia Type: General Level of consciousness: awake and alert and oriented Pain management: pain level controlled Vital Signs Assessment: post-procedure vital signs reviewed and stable Respiratory status: spontaneous breathing, nonlabored ventilation and respiratory function stable Cardiovascular status: blood pressure returned to baseline and stable Postop Assessment: no signs of nausea or vomiting Anesthetic complications: no   No complications documented.   Last Vitals:  Vitals:   10/27/19 0835 10/27/19 0903  BP: 134/74 134/73  Pulse: 72 87  Resp: 18 (!) 26  Temp: 36.4 C (!) 36.1 C  SpO2: 100% 100%    Last Pain:  Vitals:   10/27/19 0923  TempSrc:   PainSc: 0-No pain                 Jae Bruck

## 2019-10-27 NOTE — H&P (Signed)
Caitlin Lin [id number] Feb 09, 1941       HPI:  79 y/o woman with a history of recent onset dysphagia. No particular foods, rather when she is having a normal meal vs a "snack".  Denies difficulty with rice/ carrots, breads or meat.   For EGD.   Medications Prior to Admission  Medication Sig Dispense Refill Last Dose  . amLODipine (NORVASC) 5 MG tablet Take 5 mg by mouth daily.   10/27/2019 at 0640  . aspirin 81 MG tablet Take 1 tablet (81 mg total) by mouth daily. (Patient taking differently: Take 81 mg by mouth at bedtime. ) 30 tablet 3 Past Week at Unknown time  . atorvastatin (LIPITOR) 20 MG tablet Take 1 tablet by mouth once daily 90 tablet 0 10/26/2019 at Unknown time  . calcium carbonate (OSCAL) 1500 (600 Ca) MG TABS tablet Take 600 mg by mouth daily with breakfast.   Past Week at Unknown time  . Cholecalciferol 25 MCG (1000 UT) capsule Take 2,000 Units by mouth daily.    Past Week at Unknown time  . Cranberry 400 MG TABS Take 400 mg by mouth daily.   Past Week at Unknown time  . docusate sodium (COLACE) 100 MG capsule Take 100 mg by mouth at bedtime.   Past Week at Unknown time  . estradiol (ESTRACE VAGINAL) 0.1 MG/GM vaginal cream Place 1 Applicatorful vaginally 3 (three) times a week.   10/26/2019 at Unknown time  . fexofenadine-pseudoephedrine (ALLEGRA-D 24) 180-240 MG 24 hr tablet Take 1 tablet by mouth daily.   10/26/2019 at Unknown time  . glucosamine-chondroitin 500-400 MG tablet Take 1 tablet by mouth 3 (three) times daily. (Patient taking differently: Take 2 tablets by mouth daily. ) 90 tablet 3 Past Week at Unknown time  . levothyroxine (SYNTHROID) 75 MCG tablet Take 75 mcg by mouth daily before breakfast.   10/26/2019 at Unknown time  . losartan (COZAAR) 50 MG tablet Take 1 tablet by mouth once daily 90 tablet 0 10/26/2019 at Unknown time  . metoCLOPramide (REGLAN) 5 MG tablet Take 5 mg by mouth 2 (two) times daily.   10/26/2019 at Unknown time  . Multiple Vitamin (MULTIVITAMIN)  tablet Take 1 tablet by mouth daily.   Past Week at Unknown time  . Omega-3 Fatty Acids (FISH OIL) 1000 MG CAPS Take 1,000 mg by mouth daily.   Past Week at Unknown time  . pantoprazole (PROTONIX) 40 MG tablet Take 1 tablet (40 mg total) by mouth daily. 90 tablet 1 10/26/2019 at Unknown time  . Probiotic Product (PROBIOTIC DAILY PO) Take by mouth daily.   Past Week at Unknown time   Allergies  Allergen Reactions  . Quinapril Other (See Comments)    Hyperkalemia  . Codeine Rash   Past Medical History:  Diagnosis Date  . Cataracts, bilateral   . Chronic kidney disease    Followed by Dr. Holley Raring  . Chronic kidney disease   . Colon polyp   . Constipation due to slow transit   . Diarrhea   . Diarrhea   . GERD (gastroesophageal reflux disease)   . Heart murmur   . History of cyst of breast   . History of hiatal hernia   . Hyperlipidemia   . Hypertension   . Hypothyroidism   . Melanoma of lower leg ([place]) 2013  . Shingles 2013  . Shingles   . Spinal stenosis in cervical region    Dr. Sharlet Salina  . Subdural hematoma ([place])   . Thyroid  disease    Past Surgical History:  Procedure Laterality Date  . ABDOMINAL HYSTERECTOMY  1974  . ANTERIOR AND POSTERIOR VAGINAL REPAIR    . ANTERIOR AND POSTERIOR VAGINAL REPAIR W/ SACROSPINOUS LIGAMENT SUSPENSION Right   . BREAST BIOPSY  1963   Benign per pt's report  . COLONOSCOPY    . ELECTROMAGNETIC NAVIGATION BROCHOSCOPY Right 01/09/2019   Procedure: ELECTROMAGNETIC NAVIGATION BRONCHOSCOPY;  Surgeon: Tyler Pita, MD;  Location: [facility] ORS;  Service: Cardiopulmonary;  Laterality: Right;  . ESOPHAGOGASTRODUODENOSCOPY    . ESOPHAGOGASTRODUODENOSCOPY (EGD) WITH PROPOFOL N/A 11/10/2016   Procedure: ESOPHAGOGASTRODUODENOSCOPY (EGD) WITH PROPOFOL;  Surgeon: Lollie Sails, MD;  Location: [facility] ENDOSCOPY;  Service: Endoscopy;  Laterality: N/A;  . ESOPHAGOGASTRODUODENOSCOPY (EGD) WITH PROPOFOL N/A 09/09/2018   Procedure: ESOPHAGOGASTRODUODENOSCOPY  (EGD) WITH PROPOFOL;  Surgeon: Lollie Sails, MD;  Location: [facility] ENDOSCOPY;  Service: Endoscopy;  Laterality: N/A;  . LUNG BIOPSY  2007   Benign per pt's report  . LUNG BIOPSY Right   . OOPHORECTOMY  1970  . PERINEOPLASTY  12/17/2013  . sling for stress incontinence  12/18/2011   Social History   Socioeconomic History  . Marital status: Married    Spouse name: Not on file  . Number of children: 2  . Years of education: Not on file  . Highest education level: Not on file  Occupational History  . Occupation: Retired  Tobacco Use  . Smoking status: Never Smoker  . Smokeless tobacco: Never Used  Vaping Use  . Vaping Use: Never used  Substance and Sexual Activity  . Alcohol use: No  . Drug use: No  . Sexual activity: Yes  Other Topics Concern  . Not on file  Social History Narrative   Lives in [place] with husband. Has 2 children boy and girl, 4 grandchildren. Retired Presenter, broadcasting.       Daily Caffeine Use:  NO   Regular Exercise -  Not at this time due to back pain, would like to resume soon   Social Determinants of Health   Financial Resource Strain:   . Difficulty of Paying Living Expenses:   Food Insecurity:   . Worried About Charity fundraiser in the Last Year:   . Arboriculturist in the Last Year:   Transportation Needs:   . Film/video editor (Medical):   Marland Kitchen Lack of Transportation (Non-Medical):   Physical Activity:   . Days of Exercise per Week:   . Minutes of Exercise per Session:   Stress:   . Feeling of Stress :   Social Connections: Socially Integrated  . Frequency of Communication with Friends and Family: Three times a week  . Frequency of Social Gatherings with Friends and Family: Once a week  . Attends Religious Services: More than 4 times per year  . Active Member of Clubs or Organizations: Yes  . Attends Archivist Meetings: More than 4 times per year  . Marital Status: Married  Human resources officer Violence:   . Fear of Current or  Ex-Partner:   . Emotionally Abused:   Marland Kitchen Physically Abused:   . Sexually Abused:    Social History   Social History Narrative   Lives in [place] with husband. Has 2 children boy and girl, 4 grandchildren. Retired Presenter, broadcasting.       Daily Caffeine Use:  NO   Regular Exercise -  Not at this time due to back pain, would like to resume soon     ROS:  Negative.     PE: HEENT: Negative. Lungs: Clear. Cardio: RR.  Assessment/Plan:  Proceed with planned endoscopy.   Forest Gleason [facility] 10/27/2019

## 2019-10-27 NOTE — Anesthesia Preprocedure Evaluation (Signed)
Anesthesia Evaluation  Patient identified by MRN, date of birth, ID band Patient awake    Reviewed: Allergy & Precautions, NPO status , Patient's Chart, lab work & pertinent test results  History of Anesthesia Complications Negative for: history of anesthetic complications  Airway Mallampati: II  TM Distance: >3 FB Neck ROM: Full    Dental no notable dental hx.    Pulmonary neg pulmonary ROS, neg sleep apnea, neg COPD,    breath sounds clear to auscultation- rhonchi (-) wheezing      Cardiovascular hypertension, Pt. on medications (-) CAD, (-) Past MI, (-) Cardiac Stents and (-) CABG  Rhythm:Regular Rate:Normal - Systolic murmurs and - Diastolic murmurs    Neuro/Psych  Headaches, neg Seizures negative psych ROS   GI/Hepatic Neg liver ROS, hiatal hernia, GERD  ,  Endo/Other  neg diabetesHypothyroidism   Renal/GU CRFRenal disease     Musculoskeletal negative musculoskeletal ROS (+)   Abdominal (+) - obese,   Peds  Hematology  (+) anemia ,   Anesthesia Other Findings Past Medical History: No date: Cataracts, bilateral No date: Chronic kidney disease     Comment:  Followed by Dr. Holley Raring No date: Chronic kidney disease No date: Colon polyp No date: Constipation due to slow transit No date: Diarrhea No date: Diarrhea No date: GERD (gastroesophageal reflux disease) No date: Heart murmur No date: History of cyst of breast No date: History of hiatal hernia No date: Hyperlipidemia No date: Hypertension No date: Hypothyroidism 2013: Melanoma of lower leg ([place]) 2013: Shingles No date: Shingles No date: Spinal stenosis in cervical region     Comment:  Dr. Sharlet Salina No date: Subdural hematoma ([facility]) No date: Thyroid disease   Reproductive/Obstetrics                             Anesthesia Physical Anesthesia Plan  ASA: III  Anesthesia Plan: General   Post-op Pain Management:     Induction: Intravenous  PONV Risk Score and Plan: 2 and Propofol infusion  Airway Management Planned: Natural Airway  Additional Equipment:   Intra-op Plan:   Post-operative Plan:   Informed Consent: I have reviewed the patients History and Physical, chart, labs and discussed the procedure including the risks, benefits and alternatives for the proposed anesthesia with the patient or authorized representative who has indicated his/her understanding and acceptance.     Dental advisory given  Plan Discussed with: CRNA and Anesthesiologist  Anesthesia Plan Comments:         Anesthesia Quick Evaluation

## 2019-10-27 NOTE — Anesthesia Procedure Notes (Signed)
Date/Time: 10/27/2019 8:51 AM Performed by: Doreen Salvage, CRNA Pre-anesthesia Checklist: Patient identified, Emergency Drugs available, Suction available and Patient being monitored Patient Re-evaluated:Patient Re-evaluated prior to induction Oxygen Delivery Method: Nasal cannula Induction Type: IV induction Dental Injury: Teeth and Oropharynx as per pre-operative assessment  Comments: Nasal cannula with etCO2 monitoring

## 2019-10-30 ENCOUNTER — Encounter: Payer: Self-pay | Admitting: General Surgery

## 2019-11-01 DIAGNOSIS — R202 Paresthesia of skin: Secondary | ICD-10-CM | POA: Diagnosis not present

## 2019-11-01 DIAGNOSIS — E559 Vitamin D deficiency, unspecified: Secondary | ICD-10-CM | POA: Diagnosis not present

## 2019-11-01 DIAGNOSIS — I6200 Nontraumatic subdural hemorrhage, unspecified: Secondary | ICD-10-CM | POA: Diagnosis not present

## 2019-11-01 DIAGNOSIS — E538 Deficiency of other specified B group vitamins: Secondary | ICD-10-CM | POA: Diagnosis not present

## 2019-11-01 DIAGNOSIS — E531 Pyridoxine deficiency: Secondary | ICD-10-CM | POA: Diagnosis not present

## 2019-11-01 DIAGNOSIS — R200 Anesthesia of skin: Secondary | ICD-10-CM | POA: Diagnosis not present

## 2019-11-01 DIAGNOSIS — N1832 Chronic kidney disease, stage 3b: Secondary | ICD-10-CM | POA: Diagnosis not present

## 2019-11-01 DIAGNOSIS — E519 Thiamine deficiency, unspecified: Secondary | ICD-10-CM | POA: Diagnosis not present

## 2019-11-01 DIAGNOSIS — R292 Abnormal reflex: Secondary | ICD-10-CM | POA: Diagnosis not present

## 2019-11-02 ENCOUNTER — Other Ambulatory Visit: Payer: Self-pay | Admitting: Neurology

## 2019-11-02 DIAGNOSIS — R202 Paresthesia of skin: Secondary | ICD-10-CM

## 2019-11-02 DIAGNOSIS — R200 Anesthesia of skin: Secondary | ICD-10-CM

## 2019-11-02 DIAGNOSIS — R292 Abnormal reflex: Secondary | ICD-10-CM

## 2019-11-06 ENCOUNTER — Ambulatory Visit
Admission: RE | Admit: 2019-11-06 | Discharge: 2019-11-06 | Disposition: A | Discharge status: Home / Self Care | Payer: PPO | Source: Ambulatory Visit | Attending: Internal Medicine | Admitting: Internal Medicine

## 2019-11-06 ENCOUNTER — Other Ambulatory Visit: Payer: Self-pay | Admitting: Internal Medicine

## 2019-11-06 ENCOUNTER — Other Ambulatory Visit: Payer: Self-pay

## 2019-11-06 DIAGNOSIS — Z1231 Encounter for screening mammogram for malignant neoplasm of breast: Secondary | ICD-10-CM

## 2019-11-19 ENCOUNTER — Ambulatory Visit
Admission: RE | Admit: 2019-11-19 | Discharge: 2019-11-19 | Disposition: A | Discharge status: Home / Self Care | Payer: PPO | Source: Ambulatory Visit | Attending: Neurology | Admitting: Neurology

## 2019-11-19 DIAGNOSIS — R200 Anesthesia of skin: Secondary | ICD-10-CM

## 2019-11-19 DIAGNOSIS — R292 Abnormal reflex: Secondary | ICD-10-CM | POA: Insufficient documentation

## 2019-11-19 DIAGNOSIS — M4312 Spondylolisthesis, cervical region: Secondary | ICD-10-CM | POA: Diagnosis not present

## 2019-11-19 DIAGNOSIS — R202 Paresthesia of skin: Secondary | ICD-10-CM | POA: Insufficient documentation

## 2019-11-19 DIAGNOSIS — M4802 Spinal stenosis, cervical region: Secondary | ICD-10-CM | POA: Diagnosis not present

## 2019-11-19 DIAGNOSIS — M2578 Osteophyte, vertebrae: Secondary | ICD-10-CM | POA: Diagnosis not present

## 2019-11-19 DIAGNOSIS — M50223 Other cervical disc displacement at C6-C7 level: Secondary | ICD-10-CM | POA: Diagnosis not present

## 2019-11-21 ENCOUNTER — Other Ambulatory Visit: Payer: Self-pay

## 2019-11-21 ENCOUNTER — Other Ambulatory Visit (INDEPENDENT_AMBULATORY_CARE_PROVIDER_SITE_OTHER): Payer: PPO

## 2019-11-21 DIAGNOSIS — R739 Hyperglycemia, unspecified: Secondary | ICD-10-CM | POA: Diagnosis not present

## 2019-11-21 DIAGNOSIS — I10 Essential (primary) hypertension: Secondary | ICD-10-CM | POA: Diagnosis not present

## 2019-11-21 DIAGNOSIS — E7800 Pure hypercholesterolemia, unspecified: Secondary | ICD-10-CM

## 2019-11-21 DIAGNOSIS — E039 Hypothyroidism, unspecified: Secondary | ICD-10-CM | POA: Diagnosis not present

## 2019-11-21 LAB — CBC WITH DIFFERENTIAL/PLATELET
Basophils Absolute: 0 10*3/uL (ref 0.0–0.1)
Basophils Relative: 0.7 % (ref 0.0–3.0)
Eosinophils Absolute: 0.2 10*3/uL (ref 0.0–0.7)
Eosinophils Relative: 4.4 % (ref 0.0–5.0)
HCT: 37 % (ref 36.0–46.0)
Hemoglobin: 12.4 g/dL (ref 12.0–15.0)
Lymphocytes Relative: 55.7 % — ABNORMAL HIGH (ref 12.0–46.0)
Lymphs Abs: 2.6 10*3/uL (ref 0.7–4.0)
MCHC: 33.5 g/dL (ref 30.0–36.0)
MCV: 88.8 fl (ref 78.0–100.0)
Monocytes Absolute: 0.6 10*3/uL (ref 0.1–1.0)
Monocytes Relative: 13.4 % — ABNORMAL HIGH (ref 3.0–12.0)
Neutro Abs: 1.2 10*3/uL — ABNORMAL LOW (ref 1.4–7.7)
Neutrophils Relative %: 25.8 % — ABNORMAL LOW (ref 43.0–77.0)
Platelets: 172 10*3/uL (ref 150.0–400.0)
RBC: 4.17 Mil/uL (ref 3.87–5.11)
RDW: 14.5 % (ref 11.5–15.5)
WBC: 4.6 10*3/uL (ref 4.0–10.5)

## 2019-11-21 LAB — BASIC METABOLIC PANEL
BUN: 28 mg/dL — ABNORMAL HIGH (ref 6–23)
CO2: 24 mEq/L (ref 19–32)
Calcium: 9.6 mg/dL (ref 8.4–10.5)
Chloride: 109 mEq/L (ref 96–112)
Creatinine, Ser: 1.78 mg/dL — ABNORMAL HIGH (ref 0.40–1.20)
GFR: 27.52 mL/min — ABNORMAL LOW (ref >60.00)
Glucose, Bld: 102 mg/dL — ABNORMAL HIGH (ref 70–99)
Potassium: 5.3 mEq/L — ABNORMAL HIGH (ref 3.5–5.1)
Sodium: 142 mEq/L (ref 135–145)

## 2019-11-21 LAB — HEPATIC FUNCTION PANEL
ALT: 20 U/L (ref 0–35)
AST: 25 U/L (ref 0–37)
Albumin: 4 g/dL (ref 3.5–5.2)
Alkaline Phosphatase: 111 U/L (ref 39–117)
Bilirubin, Direct: 0.1 mg/dL (ref 0.0–0.3)
Total Bilirubin: 0.7 mg/dL (ref 0.2–1.2)
Total Protein: 6.9 g/dL (ref 6.0–8.3)

## 2019-11-21 LAB — LIPID PANEL
Cholesterol: 153 mg/dL (ref 0–200)
HDL: 38.6 mg/dL — ABNORMAL LOW (ref >39.00)
LDL Cholesterol: 80 mg/dL (ref 0–99)
NonHDL: 114.32
Total CHOL/HDL Ratio: 4
Triglycerides: 170 mg/dL — ABNORMAL HIGH (ref 0.0–149.0)
VLDL: 34 mg/dL (ref 0.0–40.0)

## 2019-11-21 LAB — HEMOGLOBIN A1C: Hgb A1c MFr Bld: 5.3 % (ref 4.6–6.5)

## 2019-11-21 LAB — TSH: TSH: 0.85 u[IU]/mL (ref 0.35–4.50)

## 2019-11-23 ENCOUNTER — Ambulatory Visit (INDEPENDENT_AMBULATORY_CARE_PROVIDER_SITE_OTHER): Payer: PPO

## 2019-11-23 VITALS — Ht 64.0 in | Wt 159.0 lb

## 2019-11-23 DIAGNOSIS — Z0000 Encounter for general adult medical examination without abnormal findings: Secondary | ICD-10-CM | POA: Diagnosis not present

## 2019-11-23 NOTE — Progress Notes (Addendum)
Subjective:   Caitlin Lin is a 79 y.o. female who presents for Medicare Annual (Subsequent) preventive examination.  Review of Systems    No ROS.  Medicare Wellness Virtual Visit.   Cardiac Risk Factors include: advanced age (>56men, >59 women);hypertension     Objective:    Today's Vitals   11/23/19 0832  Weight: 159 lb (72.1 kg)  Height: 5\' 4"  (1.626 m)   Body mass index is 27.29 kg/m.  Advanced Directives 11/23/2019 10/27/2019 01/09/2019 01/04/2019 11/22/2018 09/09/2018 09/17/2017  Does Patient Have a Medical Advance Directive? Yes Yes Yes Yes Yes Yes Yes  Type of Paramedic of [place];Living will Living will Living will Living will [place];Living will Living will [place];Living will  Does patient want to make changes to medical advance directive? No - Patient declined - - No - Patient declined No - Patient declined - No - Patient declined  Copy of [place] in Chart? Yes - validated most recent copy scanned in chart (See row information) - Yes - validated most recent copy scanned in chart (See row information) Yes - validated most recent copy scanned in chart (See row information) Yes - validated most recent copy scanned in chart (See row information) - Yes    Current Medications (verified) Outpatient Encounter Medications as of 11/23/2019  Medication Sig  . amLODipine (NORVASC) 5 MG tablet Take 5 mg by mouth daily.  Marland Kitchen aspirin 81 MG tablet Take 1 tablet (81 mg total) by mouth daily. (Patient taking differently: Take 81 mg by mouth at bedtime. )  . atorvastatin (LIPITOR) 20 MG tablet Take 1 tablet by mouth once daily  . calcium carbonate (OSCAL) 1500 (600 Ca) MG TABS tablet Take 600 mg by mouth daily with breakfast.  . Cholecalciferol 25 MCG (1000 UT) capsule Take 2,000 Units by mouth daily.   . Cranberry 400 MG TABS Take 400 mg by mouth daily.  Marland Kitchen docusate sodium (COLACE) 100 MG capsule Take 100  mg by mouth at bedtime.  Marland Kitchen estradiol (ESTRACE VAGINAL) 0.1 MG/GM vaginal cream Place 1 Applicatorful vaginally 3 (three) times a week.  . fexofenadine-pseudoephedrine (ALLEGRA-D 24) 180-240 MG 24 hr tablet Take 1 tablet by mouth daily.  Marland Kitchen glucosamine-chondroitin 500-400 MG tablet Take 1 tablet by mouth 3 (three) times daily. (Patient taking differently: Take 2 tablets by mouth daily. )  . levothyroxine (SYNTHROID) 75 MCG tablet Take 75 mcg by mouth daily before breakfast.  . losartan (COZAAR) 50 MG tablet Take 1 tablet by mouth once daily  . metoCLOPramide (REGLAN) 5 MG tablet Take 5 mg by mouth 2 (two) times daily.  . Multiple Vitamin (MULTIVITAMIN) tablet Take 1 tablet by mouth daily.  . Omega-3 Fatty Acids (FISH OIL) 1000 MG CAPS Take 1,000 mg by mouth daily.  . pantoprazole (PROTONIX) 40 MG tablet Take 1 tablet (40 mg total) by mouth daily.  . Probiotic Product (PROBIOTIC DAILY PO) Take by mouth daily.   No facility-administered encounter medications on file as of 11/23/2019.    Allergies (verified) Quinapril and Codeine   History: Past Medical History:  Diagnosis Date  . Cataracts, bilateral   . Chronic kidney disease    Followed by Dr. Holley Raring  . Chronic kidney disease   . Colon polyp   . Constipation due to slow transit   . Diarrhea   . Diarrhea   . GERD (gastroesophageal reflux disease)   . Heart murmur   . History of cyst  of breast   . History of hiatal hernia   . Hyperlipidemia   . Hypertension   . Hypothyroidism   . Melanoma of lower leg ([place]) 2013  . Shingles 2013  . Shingles   . Spinal stenosis in cervical region    Dr. Sharlet Salina  . Subdural hematoma ([place])   . Thyroid disease    Past Surgical History:  Procedure Laterality Date  . ABDOMINAL HYSTERECTOMY  1974  . ANTERIOR AND POSTERIOR VAGINAL REPAIR    . ANTERIOR AND POSTERIOR VAGINAL REPAIR W/ SACROSPINOUS LIGAMENT SUSPENSION Right   . BREAST BIOPSY  1963   Benign per pt's report  . COLONOSCOPY    .  ELECTROMAGNETIC NAVIGATION BROCHOSCOPY Right 01/09/2019   Procedure: ELECTROMAGNETIC NAVIGATION BRONCHOSCOPY;  Surgeon: Tyler Pita, MD;  Location: [facility] ORS;  Service: Cardiopulmonary;  Laterality: Right;  . ESOPHAGOGASTRODUODENOSCOPY    . ESOPHAGOGASTRODUODENOSCOPY (EGD) WITH PROPOFOL N/A 11/10/2016   Procedure: ESOPHAGOGASTRODUODENOSCOPY (EGD) WITH PROPOFOL;  Surgeon: Lollie Sails, MD;  Location: [facility] ENDOSCOPY;  Service: Endoscopy;  Laterality: N/A;  . ESOPHAGOGASTRODUODENOSCOPY (EGD) WITH PROPOFOL N/A 09/09/2018   Procedure: ESOPHAGOGASTRODUODENOSCOPY (EGD) WITH PROPOFOL;  Surgeon: Lollie Sails, MD;  Location: [facility] ENDOSCOPY;  Service: Endoscopy;  Laterality: N/A;  . ESOPHAGOGASTRODUODENOSCOPY (EGD) WITH PROPOFOL N/A 10/27/2019   Procedure: ESOPHAGOGASTRODUODENOSCOPY (EGD) WITH PROPOFOL;  Surgeon: Robert Bellow, MD;  Location: [facility] ENDOSCOPY;  Service: Endoscopy;  Laterality: N/A;  . LUNG BIOPSY  2007   Benign per pt's report  . LUNG BIOPSY Right   . OOPHORECTOMY  1970  . PERINEOPLASTY  12/17/2013  . sling for stress incontinence  12/18/2011   Family History  Problem Relation Age of Onset  . Arthritis Mother   . Liver disease Mother   . Cirrhosis Mother   . Liver cancer Mother   . Heart disease Father   . Heart attack Father   . Coronary artery disease Father   . Depression Brother   . Breast cancer Other   . Breast cancer Cousin        maternal cousins  . Liver cancer Cousin   . Diabetes Maternal Aunt    Social History   Socioeconomic History  . Marital status: Married    Spouse name: Not on file  . Number of children: 2  . Years of education: Not on file  . Highest education level: Not on file  Occupational History  . Occupation: Retired  Tobacco Use  . Smoking status: Never Smoker  . Smokeless tobacco: Never Used  Vaping Use  . Vaping Use: Never used  Substance and Sexual Activity  . Alcohol use: No  . Drug use: No  . Sexual activity: Yes    Other Topics Concern  . Not on file  Social History Narrative   Lives in [place] with husband. Has 2 children boy and girl, 4 grandchildren. Retired Presenter, broadcasting.       Daily Caffeine Use:  NO   Regular Exercise -  Not at this time due to back pain, would like to resume soon   Social Determinants of Health   Financial Resource Strain: Low Risk   . Difficulty of Paying Living Expenses: Not hard at all  Food Insecurity: No Food Insecurity  . Worried About Charity fundraiser in the Last Year: Never true  . Ran Out of Food in the Last Year: Never true  Transportation Needs: No Transportation Needs  . Lack of Transportation (Medical): No  . Lack of Transportation (Non-Medical):  No  Physical Activity:   . Days of Exercise per Week: Not on file  . Minutes of Exercise per Session: Not on file  Stress: No Stress Concern Present  . Feeling of Stress : Not at all  Social Connections:   . Frequency of Communication with Friends and Family: Not on file  . Frequency of Social Gatherings with Friends and Family: Not on file  . Attends Religious Services: Not on file  . Active Member of Clubs or Organizations: Not on file  . Attends Archivist Meetings: Not on file  . Marital Status: Not on file    Tobacco Counseling Counseling given: Not Answered   Clinical Intake:  Pre-visit preparation completed: Yes        Diabetes: No  How often do you need to have someone help you when you read instructions, pamphlets, or other written materials from your doctor or pharmacy?: 1 - Never  Interpreter Needed?: No      Activities of Daily Living In your present state of health, do you have any difficulty performing the following activities: 11/23/2019 01/04/2019  Hearing? N N  Vision? N N  Difficulty concentrating or making decisions? N N  Walking or climbing stairs? N N  Dressing or bathing? N N  Doing errands, shopping? N N  Preparing Food and eating ? N -  Using the  Toilet? N -  In the past six months, have you accidently leaked urine? Y -  Comment Managed with daily pad -  Do you have problems with loss of bowel control? N -  Managing your Medications? N -  Managing your Finances? N -  Housekeeping or managing your Housekeeping? N -  Some recent data might be hidden    Patient Care Team: Einar Pheasant, MD as PCP - General (Internal Medicine) Kate Sable, MD as PCP - Cardiology (Cardiology) Anthonette Legato, MD (Internal Medicine) Ok Edwards, NP as Nurse Practitioner (Gastroenterology)  Indicate any recent Medical Services you may have received from other than [facility] providers in the past year (date may be approximate).     Assessment:   This is a routine wellness examination for Caitlin Lin.  I connected with Caitlin Lin today by telephone and verified that I am speaking with the correct person using two identifiers. Location patient: home Location provider: work Persons participating in the virtual visit: patient, Marine scientist.    I discussed the limitations, risks, security and privacy concerns of performing an evaluation and management service by telephone and the availability of in person appointments. The patient expressed understanding and verbally consented to this telephonic visit.    Interactive audio and video telecommunications were attempted between this provider and patient, however failed, due to patient having technical difficulties OR patient did not have access to video capability.  We continued and completed visit with audio only.  Some vital signs may be absent or patient reported.   Hearing/Vision screen  Hearing Screening   125Hz  250Hz  500Hz  1000Hz  2000Hz  3000Hz  4000Hz  6000Hz  8000Hz   Right ear:           Left ear:           Comments: Patient is able to hear conversational tones without difficulty.  No issues reported.   Vision Screening Comments: Followed by [facility] Visual acuity not assessed,  virtual visit.  They have seen their ophthalmologist in the last 12 months.     Dietary issues and exercise activities discussed: Current Exercise Habits: Home exercise routine, Type  of exercise: walking, Intensity: Mild  Goals      Patient Stated   .  Increase physical activity (pt-stated)      Walk more for exercise       Depression Screen PHQ 2/9 Scores 11/23/2019 11/22/2018 09/17/2017 06/03/2017 02/10/2017 02/04/2016 09/24/2014  PHQ - 2 Score 0 0 0 0 0 0 0  PHQ- 9 Score - - - 4 - - -    Fall Risk Fall Risk  11/23/2019 01/13/2019 12/26/2018 11/22/2018 09/19/2018  Falls in the past year? 0 0 0 0 0  Number falls in past yr: 0 0 - - 0  Injury with Fall? - 0 - - 0  Comment - - - - -  Risk for fall due to : - - - - -  Risk for fall due to: Comment - - - - -  Follow up Falls evaluation completed - - - -   Handrails in use when climbing stairs? Yes Home free of loose throw rugs in walkways, pet beds, electrical cords, etc? Yes  Adequate lighting in your home to reduce risk of falls? Yes   ASSISTIVE DEVICES UTILIZED TO PREVENT FALLS: Life alert? No  Use of a cane, walker or w/c? No   TIMED UP AND GO:  Was the test performed? No . Virtual visit.   Cognitive Function: MMSE - Mini Mental State Exam 09/24/2014 09/24/2014  Orientation to time 5 5  Orientation to Place 5 -  Registration 3 3  Attention/ Calculation 5 -  Recall 3 3  Language- name 2 objects 2 -  Language- repeat 1 1  Language- follow 3 step command 3 -  Language- read & follow direction 1 1  Write a sentence 1 -  Copy design 1 1  Total score 30 -     6CIT Screen 11/23/2019 11/22/2018 09/17/2017  What Year? 0 points 0 points 0 points  What month? 0 points 0 points 0 points  What time? 0 points 0 points 0 points  Count back from 20 - 0 points 0 points  Months in reverse - 0 points 0 points  Repeat phrase - 0 points 0 points  Total Score - 0 0    Immunizations Immunization History  Administered Date(s) Administered    . Hepatitis A 07/14/2017, 08/17/2017  . Hepatitis B 07/14/2017, 08/17/2017  . Influenza Split 12/15/2010, 03/07/2012  . Influenza, High Dose Seasonal PF 12/21/2016, 11/17/2017, 12/26/2018  . Influenza,inj,Quad PF,6+ Mos 12/05/2013  . Influenza-Unspecified 12/25/2014, 01/03/2016, 12/28/2016  . PFIZER SARS-COV-2 Vaccination 05/29/2019, 06/19/2019  . Pneumococcal Conjugate-13 03/12/2014  . Pneumococcal Polysaccharide-23 12/09/2005, 03/03/2013  . Tdap 03/07/2012  . Zoster 12/09/2009    Health Maintenance Health Maintenance  Topic Date Due  . INFLUENZA VACCINE  06/13/2020 (Originally 10/15/2019)  . Hepatitis C Screening  11/22/2020 (Originally 26-Sep-1940)  . MAMMOGRAM  11/05/2020  . TETANUS/TDAP  03/07/2022  . DEXA SCAN  Completed  . COVID-19 Vaccine  Completed  . PNA vac Low Risk Adult  Completed   Hepatitis C screening- deferred per patient.   Influenza vaccine- plans to receive at local pharmacy and update immunization record accordingly. Deferred.   Dental Screening: Recommended annual dental exams for proper oral hygiene  Community Resource Referral / Chronic Care Management: CRR required this visit?  No   CCM required this visit?  No      Plan:   Keep all routine maintenance appointments.   Cpe 12/22/19 @ 1:30  I have personally reviewed and noted the following  in the patient's chart:   . Medical and social history . Use of alcohol, tobacco or illicit drugs  . Current medications and supplements . Functional ability and status . Nutritional status . Physical activity . Advanced directives . List of other physicians . Hospitalizations, surgeries, and ER visits in previous 12 months . Vitals . Screenings to include cognitive, depression, and falls . Referrals and appointments  In addition, I have reviewed and discussed with patient certain preventive protocols, quality metrics, and best practice recommendations. A written personalized care plan for preventive  services as well as general preventive health recommendations were provided to patient via mail.     Varney Biles, LPN   0/0/[id number]    Reviewed above information.  Agree with assessment and plan.  Dr Nicki Reaper

## 2019-11-23 NOTE — Patient Instructions (Addendum)
Caitlin Lin , Thank you for taking time to come for your Medicare Wellness Visit. I appreciate your ongoing commitment to your health goals. Please review the following plan we discussed and let me know if I can assist you in the future.   These are the goals we discussed: Goals      Patient Stated   .  Increase physical activity (pt-stated)      Walk more for exercise        This is a list of the screening recommended for you and due dates:  Health Maintenance  Topic Date Due  . Flu Shot  06/13/2020*  .  Hepatitis C: One time screening is recommended by Center for Disease Control  (CDC) for  adults born from [birth year] through 1964.   11/22/2020*  . Mammogram  11/05/2020  . Tetanus Vaccine  03/07/2022  . DEXA scan (bone density measurement)  Completed  . COVID-19 Vaccine  Completed  . Pneumonia vaccines  Completed  *Topic was postponed. The date shown is not the original due date.    Immunizations Immunization History  Administered Date(s) Administered  . Hepatitis A 07/14/2017, 08/17/2017  . Hepatitis B 07/14/2017, 08/17/2017  . Influenza Split 12/15/2010, 03/07/2012  . Influenza, High Dose Seasonal PF 12/21/2016, 11/17/2017, 12/26/2018  . Influenza,inj,Quad PF,6+ Mos 12/05/2013  . Influenza-Unspecified 12/25/2014, 01/03/2016, 12/28/2016  . PFIZER SARS-COV-2 Vaccination 05/29/2019, 06/19/2019  . Pneumococcal Conjugate-13 03/12/2014  . Pneumococcal Polysaccharide-23 12/09/2005, 03/03/2013  . Tdap 03/07/2012  . Zoster 12/09/2009   Keep all routine maintenance appointments.   Cpe 12/22/19 @ 1:30  Advanced directives: on file  Conditions/risks identified: none new  Follow up in one year for your annual wellness visit    Preventive Care 65 Years and Older, Female Preventive care refers to lifestyle choices and visits with your health care provider that can promote health and wellness. What does preventive care include?  A yearly physical exam. This is also called an  annual well check.  Dental exams once or twice a year.  Routine eye exams. Ask your health care provider how often you should have your eyes checked.  Personal lifestyle choices, including:  Daily care of your teeth and gums.  Regular physical activity.  Eating a healthy diet.  Avoiding tobacco and drug use.  Limiting alcohol use.  Practicing safe sex.  Taking low-dose aspirin every day.  Taking vitamin and mineral supplements as recommended by your health care provider. What happens during an annual well check? The services and screenings done by your health care provider during your annual well check will depend on your age, overall health, lifestyle risk factors, and family history of disease. Counseling  Your health care provider may ask you questions about your:  Alcohol use.  Tobacco use.  Drug use.  Emotional well-being.  Home and relationship well-being.  Sexual activity.  Eating habits.  History of falls.  Memory and ability to understand (cognition).  Work and work Statistician.  Reproductive health. Screening  You may have the following tests or measurements:  Height, weight, and BMI.  Blood pressure.  Lipid and cholesterol levels. These may be checked every 5 years, or more frequently if you are over 69 years old.  Skin check.  Lung cancer screening. You may have this screening every year starting at age 52 if you have a 30-pack-year history of smoking and currently smoke or have quit within the past 15 years.  Fecal occult blood test (FOBT) of the stool. You may have  this test every year starting at age 5.  Flexible sigmoidoscopy or colonoscopy. You may have a sigmoidoscopy every 5 years or a colonoscopy every 10 years starting at age 27.  Hepatitis C blood test.  Hepatitis B blood test.  Sexually transmitted disease (STD) testing.  Diabetes screening. This is done by checking your blood sugar (glucose) after you have not eaten for  a while (fasting). You may have this done every 1-3 years.  Bone density scan. This is done to screen for osteoporosis. You may have this done starting at age 57.  Mammogram. This may be done every 1-2 years. Talk to your health care provider about how often you should have regular mammograms. Talk with your health care provider about your test results, treatment options, and if necessary, the need for more tests. Vaccines  Your health care provider may recommend certain vaccines, such as:  Influenza vaccine. This is recommended every year.  Tetanus, diphtheria, and acellular pertussis (Tdap, Td) vaccine. You may need a Td booster every 10 years.  Zoster vaccine. You may need this after age 73.  Pneumococcal 13-valent conjugate (PCV13) vaccine. One dose is recommended after age 78.  Pneumococcal polysaccharide (PPSV23) vaccine. One dose is recommended after age 71. Talk to your health care provider about which screenings and vaccines you need and how often you need them. This information is not intended to replace advice given to you by your health care provider. Make sure you discuss any questions you have with your health care provider. Document Released: 03/29/2015 Document Revised: 11/20/2015 Document Reviewed: 01/01/2015 Elsevier Interactive Patient Education  2017 [place] Prevention in the Home Falls can cause injuries. They can happen to people of all ages. There are many things you can do to make your home safe and to help prevent falls. What can I do on the outside of my home?  Regularly fix the edges of walkways and driveways and fix any cracks.  Remove anything that might make you trip as you walk through a door, such as a raised step or threshold.  Trim any bushes or trees on the path to your home.  Use bright outdoor lighting.  Clear any walking paths of anything that might make someone trip, such as rocks or tools.  Regularly check to see if handrails  are loose or broken. Make sure that both sides of any steps have handrails.  Any raised decks and porches should have guardrails on the edges.  Have any leaves, snow, or ice cleared regularly.  Use sand or salt on walking paths during winter.  Clean up any spills in your garage right away. This includes oil or grease spills. What can I do in the bathroom?  Use night lights.  Install grab bars by the toilet and in the tub and shower. Do not use towel bars as grab bars.  Use non-skid mats or decals in the tub or shower.  If you need to sit down in the shower, use a plastic, non-slip stool.  Keep the floor dry. Clean up any water that spills on the floor as soon as it happens.  Remove soap buildup in the tub or shower regularly.  Attach bath mats securely with double-sided non-slip rug tape.  Do not have throw rugs and other things on the floor that can make you trip. What can I do in the bedroom?  Use night lights.  Make sure that you have a light by your bed that is easy to  reach.  Do not use any sheets or blankets that are too big for your bed. They should not hang down onto the floor.  Have a firm chair that has side arms. You can use this for support while you get dressed.  Do not have throw rugs and other things on the floor that can make you trip. What can I do in the kitchen?  Clean up any spills right away.  Avoid walking on wet floors.  Keep items that you use a lot in easy-to-reach places.  If you need to reach something above you, use a strong step stool that has a grab bar.  Keep electrical cords out of the way.  Do not use floor polish or wax that makes floors slippery. If you must use wax, use non-skid floor wax.  Do not have throw rugs and other things on the floor that can make you trip. What can I do with my stairs?  Do not leave any items on the stairs.  Make sure that there are handrails on both sides of the stairs and use them. Fix handrails  that are broken or loose. Make sure that handrails are as long as the stairways.  Check any carpeting to make sure that it is firmly attached to the stairs. Fix any carpet that is loose or worn.  Avoid having throw rugs at the top or bottom of the stairs. If you do have throw rugs, attach them to the floor with carpet tape.  Make sure that you have a light switch at the top of the stairs and the bottom of the stairs. If you do not have them, ask someone to add them for you. What else can I do to help prevent falls?  Wear shoes that:  Do not have high heels.  Have rubber bottoms.  Are comfortable and fit you well.  Are closed at the toe. Do not wear sandals.  If you use a stepladder:  Make sure that it is fully opened. Do not climb a closed stepladder.  Make sure that both sides of the stepladder are locked into place.  Ask someone to hold it for you, if possible.  Clearly mark and make sure that you can see:  Any grab bars or handrails.  First and last steps.  Where the edge of each step is.  Use tools that help you move around (mobility aids) if they are needed. These include:  Canes.  Walkers.  Scooters.  Crutches.  Turn on the lights when you go into a dark area. Replace any light bulbs as soon as they burn out.  Set up your furniture so you have a clear path. Avoid moving your furniture around.  If any of your floors are uneven, fix them.  If there are any pets around you, be aware of where they are.  Review your medicines with your doctor. Some medicines can make you feel dizzy. This can increase your chance of falling. Ask your doctor what other things that you can do to help prevent falls. This information is not intended to replace advice given to you by your health care provider. Make sure you discuss any questions you have with your health care provider. Document Released: 12/27/2008 Document Revised: 08/08/2015 Document Reviewed: 04/06/2014 Elsevier  Interactive Patient Education  2017 [employer].

## 2019-11-24 ENCOUNTER — Other Ambulatory Visit
Admission: RE | Admit: 2019-11-24 | Discharge: 2019-11-24 | Disposition: A | Discharge status: Home / Self Care | Payer: PPO | Attending: Internal Medicine | Admitting: Internal Medicine

## 2019-11-24 ENCOUNTER — Telehealth: Payer: Self-pay

## 2019-11-24 DIAGNOSIS — E875 Hyperkalemia: Secondary | ICD-10-CM

## 2019-11-24 LAB — POTASSIUM: Potassium: 4.3 mmol/L (ref 3.5–5.1)

## 2019-11-24 NOTE — Telephone Encounter (Signed)
Order placed for lab at [facility]

## 2019-11-27 ENCOUNTER — Other Ambulatory Visit: Payer: Self-pay | Admitting: Internal Medicine

## 2019-12-17 ENCOUNTER — Other Ambulatory Visit: Payer: Self-pay | Admitting: Internal Medicine

## 2019-12-22 ENCOUNTER — Other Ambulatory Visit: Payer: Self-pay

## 2019-12-22 ENCOUNTER — Ambulatory Visit (INDEPENDENT_AMBULATORY_CARE_PROVIDER_SITE_OTHER): Payer: PPO | Admitting: Internal Medicine

## 2019-12-22 VITALS — BP 124/70 | HR 71 | Temp 98.3°F | Resp 16 | Ht 64.0 in | Wt 160.0 lb

## 2019-12-22 DIAGNOSIS — R200 Anesthesia of skin: Secondary | ICD-10-CM

## 2019-12-22 DIAGNOSIS — N184 Chronic kidney disease, stage 4 (severe): Secondary | ICD-10-CM | POA: Diagnosis not present

## 2019-12-22 DIAGNOSIS — K219 Gastro-esophageal reflux disease without esophagitis: Secondary | ICD-10-CM | POA: Diagnosis not present

## 2019-12-22 DIAGNOSIS — E7800 Pure hypercholesterolemia, unspecified: Secondary | ICD-10-CM

## 2019-12-22 DIAGNOSIS — R739 Hyperglycemia, unspecified: Secondary | ICD-10-CM

## 2019-12-22 DIAGNOSIS — D509 Iron deficiency anemia, unspecified: Secondary | ICD-10-CM | POA: Diagnosis not present

## 2019-12-22 DIAGNOSIS — E039 Hypothyroidism, unspecified: Secondary | ICD-10-CM | POA: Diagnosis not present

## 2019-12-22 DIAGNOSIS — I10 Essential (primary) hypertension: Secondary | ICD-10-CM

## 2019-12-22 DIAGNOSIS — Z23 Encounter for immunization: Secondary | ICD-10-CM | POA: Diagnosis not present

## 2019-12-22 DIAGNOSIS — I700 Atherosclerosis of aorta: Secondary | ICD-10-CM

## 2019-12-22 DIAGNOSIS — Z1159 Encounter for screening for other viral diseases: Secondary | ICD-10-CM

## 2019-12-22 DIAGNOSIS — K760 Fatty (change of) liver, not elsewhere classified: Secondary | ICD-10-CM

## 2019-12-22 MED ORDER — PANTOPRAZOLE SODIUM 40 MG PO TBEC: DELAYED_RELEASE_TABLET | ORAL | 1 refills | Status: DC

## 2019-12-22 NOTE — Progress Notes (Signed)
Patient ID: Caitlin Lin, female   DOB: Jun 26, 1940, 79 y.o.   MRN: [id number]   Subjective:    Patient ID: Caitlin Lin, female    DOB: 1941/02/25, 79 y.o.   MRN: [id number]  HPI This visit occurred during the SARS-CoV-2 public health emergency.  Safety protocols were in place, including screening questions prior to the visit, additional usage of staff PPE, and extensive cleaning of exam room while observing appropriate contact time as indicated for disinfecting solutions.  Patient here for a scheduled follow up.  She reports she is doing well.  Feels good.  On PPI.  Taking twice a day now.  Symptoms controlled on two per day.  Tries to stay active.  No chest pain or sob reported.  No abdominal pain.  Eating oatmeal and prunes - keeps bowels moving.  States blood pressures have been averaging <130/69-72.  Has seen neurology - peripheral neuropathy - taking alpha lipoic acid. Had mri c-spine - moderate C5-6 spinal canal stenosis with severe right and moderate left neural foraminal stenosis and moderate left C3-4 and right C6-7 neural foraminal stenosis.  Planning for NCS next week.    Past Medical History:  Diagnosis Date  . Cataracts, bilateral   . Chronic kidney disease    Followed by Dr. Holley Raring  . Chronic kidney disease   . Colon polyp   . Constipation due to slow transit   . Diarrhea   . Diarrhea   . GERD (gastroesophageal reflux disease)   . Heart murmur   . History of cyst of breast   . History of hiatal hernia   . Hyperlipidemia   . Hypertension   . Hypothyroidism   . Melanoma of lower leg ([place]) 2013  . Shingles 2013  . Shingles   . Spinal stenosis in cervical region    Dr. Sharlet Salina  . Subdural hematoma ([place])   . Thyroid disease    Past Surgical History:  Procedure Laterality Date  . ABDOMINAL HYSTERECTOMY  1974  . ANTERIOR AND POSTERIOR VAGINAL REPAIR    . ANTERIOR AND POSTERIOR VAGINAL REPAIR W/ SACROSPINOUS LIGAMENT SUSPENSION Right   . BREAST BIOPSY  1963    Benign per pt's report  . COLONOSCOPY    . ELECTROMAGNETIC NAVIGATION BROCHOSCOPY Right 01/09/2019   Procedure: ELECTROMAGNETIC NAVIGATION BRONCHOSCOPY;  Surgeon: Tyler Pita, MD;  Location: [facility] ORS;  Service: Cardiopulmonary;  Laterality: Right;  . ESOPHAGOGASTRODUODENOSCOPY    . ESOPHAGOGASTRODUODENOSCOPY (EGD) WITH PROPOFOL N/A 11/10/2016   Procedure: ESOPHAGOGASTRODUODENOSCOPY (EGD) WITH PROPOFOL;  Surgeon: Lollie Sails, MD;  Location: [facility]COPY;  Service: Endoscopy;  Laterality: N/A;  . ESOPHAGOGASTRODUODENOSCOPY (EGD) WITH PROPOFOL N/A 09/09/2018   Procedure: ESOPHAGOGASTRODUODENOSCOPY (EGD) WITH PROPOFOL;  Surgeon: Lollie Sails, MD;  Location: [facility] ENDOSCOPY;  Service: Endoscopy;  Laterality: N/A;  . ESOPHAGOGASTRODUODENOSCOPY (EGD) WITH PROPOFOL N/A 10/27/2019   Procedure: ESOPHAGOGASTRODUODENOSCOPY (EGD) WITH PROPOFOL;  Surgeon: Robert Bellow, MD;  Location: [facility] ENDOSCOPY;  Service: Endoscopy;  Laterality: N/A;  . LUNG BIOPSY  2007   Benign per pt's report  . LUNG BIOPSY Right   . OOPHORECTOMY  1970  . PERINEOPLASTY  12/17/2013  . sling for stress incontinence  12/18/2011   Family History  Problem Relation Age of Onset  . Arthritis Mother   . Liver disease Mother   . Cirrhosis Mother   . Liver cancer Mother   . Heart disease Father   . Heart attack Father   . Coronary artery disease Father   . Depression  Brother   . Breast cancer Other   . Breast cancer Cousin        maternal cousins  . Liver cancer Cousin   . Diabetes Maternal Aunt    Social History   Socioeconomic History  . Marital status: Married    Spouse name: Not on file  . Number of children: 2  . Years of education: Not on file  . Highest education level: Not on file  Occupational History  . Occupation: Retired  Tobacco Use  . Smoking status: Never Smoker  . Smokeless tobacco: Never Used  Vaping Use  . Vaping Use: Never used  Substance and Sexual Activity  . Alcohol use:  No  . Drug use: No  . Sexual activity: Yes  Other Topics Concern  . Not on file  Social History Narrative   Lives in [place] with husband. Has 2 children boy and girl, 4 grandchildren. Retired - daycare.       Daily Caffeine Use:  NO   Regular Exercise -  Not at this time due to back pain, would like to resume soon   Social Determinants of Health   Financial Resource Strain: Low Risk   . Difficulty of Paying Living Expenses: Not hard at all  Food Insecurity: No Food Insecurity  . Worried About Running Out of Food in the Last Year: Never true  . Ran Out of Food in the Last Year: Never true  Transportation Needs: No Transportation Needs  . Lack of Transportation (Medical): No  . Lack of Transportation (Non-Medical): No  Physical Activity:   . Days of Exercise per Week: Not on file  . Minutes of Exercise per Session: Not on file  Stress: No Stress Concern Present  . Feeling of Stress : Not at all  Social Connections:   . Frequency of Communication with Friends and Family: Not on file  . Frequency of Social Gatherings with Friends and Family: Not on file  . Attends Religious Services: Not on file  . Active Member of Clubs or Organizations: Not on file  . Attends Club or Organization Meetings: Not on file  . Marital Status: Not on file    Outpatient Encounter Medications as of 12/22/2019  Medication Sig  . amLODipine (NORVASC) 5 MG tablet Take 5 mg by mouth daily.  . aspirin 81 MG tablet Take 1 tablet (81 mg total) by mouth daily. (Patient taking differently: Take 81 mg by mouth at bedtime. )  . atorvastatin (LIPITOR) 20 MG tablet Take 1 tablet by mouth once daily  . calcium carbonate (OSCAL) 1500 (600 Ca) MG TABS tablet Take 600 mg by mouth daily with breakfast.  . Cholecalciferol 25 MCG (1000 UT) capsule Take 2,000 Units by mouth daily.   . Cranberry 400 MG TABS Take 400 mg by mouth daily.  . docusate sodium (COLACE) 100 MG capsule Take 100 mg by mouth at bedtime.  .  estradiol (ESTRACE VAGINAL) 0.1 MG/GM vaginal cream Place 1 Applicatorful vaginally 3 (three) times a week.  . EUTHYROX 75 MCG tablet Take 1 tablet by mouth once daily with breakfast  . fexofenadine-pseudoephedrine (ALLEGRA-D 24) 180-240 MG 24 hr tablet Take 1 tablet by mouth daily.  . glucosamine-chondroitin 500-400 MG tablet Take 1 tablet by mouth 3 (three) times daily. (Patient taking differently: Take 2 tablets by mouth daily. )  . losartan (COZAAR) 50 MG tablet Take 1 tablet by mouth once daily  . metoCLOPramide (REGLAN) 5 MG tablet Take 5 mg by mouth 2 (two)   times daily.  . Multiple Vitamin (MULTIVITAMIN) tablet Take 1 tablet by mouth daily.  . Omega-3 Fatty Acids (FISH OIL) 1000 MG CAPS Take 1,000 mg by mouth daily.  . pantoprazole (PROTONIX) 40 MG tablet One tablet 30 minutes before breakfast and one tablet 30 minutes before your evening meal - prn.  . Probiotic Product (PROBIOTIC DAILY PO) Take by mouth daily.  . [DISCONTINUED] pantoprazole (PROTONIX) 40 MG tablet Take 1 tablet (40 mg total) by mouth daily.   No facility-administered encounter medications on file as of 12/22/2019.    Review of Systems  Constitutional: Negative for appetite change and unexpected weight change.  HENT: Negative for congestion and sinus pressure.   Respiratory: Negative for cough, chest tightness and shortness of breath.   Cardiovascular: Negative for chest pain, palpitations and leg swelling.  Gastrointestinal: Negative for abdominal pain, diarrhea, nausea and vomiting.  Genitourinary: Negative for difficulty urinating and dysuria.  Musculoskeletal: Negative for joint swelling and myalgias.  Skin: Negative for color change and rash.  Neurological: Negative for dizziness, light-headedness and headaches.  Psychiatric/Behavioral: Negative for agitation and dysphoric mood.       Objective:    Physical Exam Vitals reviewed.  Constitutional:      General: She is not in acute distress.    Appearance:  Normal appearance.  HENT:     Head: Normocephalic and atraumatic.     Right Ear: External ear normal.     Left Ear: External ear normal.  Eyes:     General: No scleral icterus.       Right eye: No discharge.        Left eye: No discharge.     Conjunctiva/sclera: Conjunctivae normal.  Neck:     Thyroid: No thyromegaly.  Cardiovascular:     Rate and Rhythm: Normal rate and regular rhythm.  Pulmonary:     Effort: No respiratory distress.     Breath sounds: Normal breath sounds. No wheezing.  Abdominal:     General: Bowel sounds are normal.     Palpations: Abdomen is soft.     Tenderness: There is no abdominal tenderness.  Musculoskeletal:        General: No tenderness.     Cervical back: Neck supple. No tenderness.  Lymphadenopathy:     Cervical: No cervical adenopathy.  Skin:    Findings: No erythema or rash.  Neurological:     Mental Status: She is alert.  Psychiatric:        Mood and Affect: Mood normal.        Behavior: Behavior normal.     BP 124/70   Pulse 71   Temp 98.3 F (36.8 C)   Resp 16   Ht 5' 4" (1.626 m)   Wt 160 lb (72.6 kg)   SpO2 99%   BMI 27.46 kg/m  Wt Readings from Last 3 Encounters:  12/22/19 160 lb (72.6 kg)  11/23/19 159 lb (72.1 kg)  10/27/19 159 lb (72.1 kg)     Lab Results  Component Value Date   WBC 4.6 11/21/2019   HGB 12.4 11/21/2019   HCT 37.0 11/21/2019   PLT 172.0 11/21/2019   GLUCOSE 102 (H) 11/21/2019   CHOL 153 11/21/2019   TRIG 170.0 (H) 11/21/2019   HDL 38.60 (L) 11/21/2019   LDLDIRECT 84.0 07/22/2018   LDLCALC 80 11/21/2019   ALT 20 11/21/2019   AST 25 11/21/2019   NA 142 11/21/2019   K 4.3 11/24/2019   CL 109 11/21/2019  CREATININE 1.78 (H) 11/21/2019   BUN 28 (H) 11/21/2019   CO2 24 11/21/2019   TSH 0.85 11/21/2019   HGBA1C 5.3 11/21/2019   MICROALBUR 2.1 (H) 03/27/2015       Assessment & Plan:   Problem List Items Addressed This Visit    Numbness of toes    Planning for NCS.  Taking alpha lipoic  acid.        Hypothyroidism (Chronic)    On thyroid replacement.  Follow tsh.       Hypertension (Chronic)    Blood pressure as outlined.  Continue amlodipine and losartan. Follow pressures.  Follow metabolic panel.        Hyperglycemia    Low carb diet and exercise.  Follow met b and a1c.       Relevant Orders   Hemoglobin A1c   Hypercholesterolemia    On lipitor.  Low cholesterol diet and exercise.  Follow lipid panel and liver function tests.        Relevant Orders   Hepatic function panel   Lipid panel   Basic metabolic panel   GERD (gastroesophageal reflux disease)    Doing well on protonix bid now.  Upper symptoms controlled.  Follow.       Relevant Medications   pantoprazole (PROTONIX) 40 MG tablet   Fatty liver    Diet and exercise.  Follow liver function tests.        CKD (chronic kidney disease) stage 4, GFR 15-29 ml/min (HCC)    Followed by nephrology.  Has been stable.  Continue to avoid antiinflammatories.        Aortic atherosclerosis (HCC)    Continue lipitor.       Anemia, iron deficiency    Follow cbc.       Relevant Orders   CBC with Differential/Platelet    Other Visit Diagnoses    Need for immunization against influenza    -  Primary   Relevant Orders   Flu Vaccine QUAD High Dose(Fluad) (Completed)   Need for hepatitis C screening test       Relevant Orders   Hepatitis C antibody       Einar Pheasant, MD

## 2019-12-23 ENCOUNTER — Encounter: Payer: Self-pay | Admitting: Internal Medicine

## 2019-12-23 NOTE — Assessment & Plan Note (Signed)
Doing well on protonix bid now.  Upper symptoms controlled.  Follow.

## 2019-12-23 NOTE — Assessment & Plan Note (Signed)
On lipitor.  Low cholesterol diet and exercise.  Follow lipid panel and liver function tests.   

## 2019-12-23 NOTE — Assessment & Plan Note (Signed)
Diet and exercise.  Follow liver function tests.   

## 2019-12-23 NOTE — Assessment & Plan Note (Signed)
Followed by nephrology.  Has been stable.  Continue to avoid antiinflammatories.

## 2019-12-23 NOTE — Assessment & Plan Note (Signed)
Follow cbc.  

## 2019-12-23 NOTE — Assessment & Plan Note (Signed)
Blood pressure as outlined.  Continue amlodipine and losartan. Follow pressures.  Follow metabolic panel.

## 2019-12-23 NOTE — Assessment & Plan Note (Signed)
Continue lipitor  ?

## 2019-12-23 NOTE — Assessment & Plan Note (Signed)
Low carb diet and exercise.  Follow met b and a1c.  

## 2019-12-23 NOTE — Assessment & Plan Note (Signed)
On thyroid replacement.  Follow tsh.  

## 2019-12-23 NOTE — Assessment & Plan Note (Signed)
Planning for NCS.  Taking alpha lipoic acid.

## 2019-12-26 DIAGNOSIS — R202 Paresthesia of skin: Secondary | ICD-10-CM | POA: Diagnosis not present

## 2019-12-26 DIAGNOSIS — R200 Anesthesia of skin: Secondary | ICD-10-CM | POA: Diagnosis not present

## 2020-02-02 ENCOUNTER — Other Ambulatory Visit: Payer: Self-pay | Admitting: Internal Medicine

## 2020-02-03 IMAGING — NM NUCLEAR MEDICINE HEPATOBILIARY IMAGING WITH GALLBLADDER EF
2 series · 12 of 12 positions shown · non-contrast
Comparison: 07/20/2017

CLINICAL DATA: Right upper quadrant pain and tenderness for 6
months. Nausea.

EXAM:
NUCLEAR MEDICINE HEPATOBILIARY IMAGING WITH GALLBLADDER EF
TECHNIQUE: Sequential images of the abdomen were obtained [DATE] minutes
following intravenous administration of radiopharmaceutical. After
oral ingestion of Ensure, gallbladder ejection fraction was
determined. At 60 min, normal ejection fraction is greater than 33%.
RADIOPHARMACEUTICALS:  4.8 mCi 2c-SSm  Choletec IV

[Series 1000: hepatobiliary scan · 9.59mm/px · 6 of 60 frames shown]
[frame 6/60]
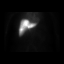
[frame 16/60]
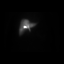
[frame 26/60]
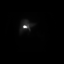
[frame 36/60]
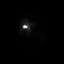
[frame 46/60]
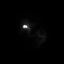
[frame 56/60]
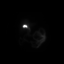

[Series 1000: gallbladder ef · 4.80mm/px · 6 of 120 frames shown]
[frame 11/120]
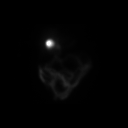
[frame 31/120]
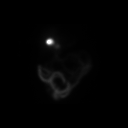
[frame 51/120]
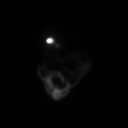
[frame 71/120]
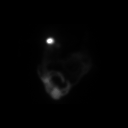
[frame 91/120]
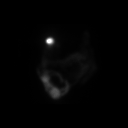
[frame 111/120]
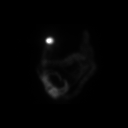

[12 of 12 positions shown; findings below may reference images not displayed]

FINDINGS: Prompt uptake and biliary excretion of activity by the liver is
seen. Gallbladder activity is visualized, consistent with patency of
cystic duct. Biliary activity passes into small bowel, consistent
with patent common bile duct.

Calculated gallbladder ejection fraction is 42%. (Normal gallbladder
ejection fraction with Ensure is greater than 33%.)
IMPRESSION: Normal hepatobiliary scan.

Normal gallbladder ejection fraction.

## 2020-02-19 ENCOUNTER — Telehealth: Payer: Self-pay | Admitting: Internal Medicine

## 2020-02-19 ENCOUNTER — Emergency Department
Admission: EM | Admit: 2020-02-19 | Discharge: 2020-02-19 | Disposition: A | Discharge status: Home / Self Care | Payer: PPO | Attending: Emergency Medicine | Admitting: Emergency Medicine

## 2020-02-19 ENCOUNTER — Emergency Department: Payer: PPO

## 2020-02-19 ENCOUNTER — Other Ambulatory Visit: Payer: Self-pay

## 2020-02-19 DIAGNOSIS — E039 Hypothyroidism, unspecified: Secondary | ICD-10-CM | POA: Diagnosis not present

## 2020-02-19 DIAGNOSIS — Z79899 Other long term (current) drug therapy: Secondary | ICD-10-CM | POA: Diagnosis not present

## 2020-02-19 DIAGNOSIS — R0789 Other chest pain: Secondary | ICD-10-CM

## 2020-02-19 DIAGNOSIS — Z9889 Other specified postprocedural states: Secondary | ICD-10-CM | POA: Diagnosis not present

## 2020-02-19 DIAGNOSIS — Z20822 Contact with and (suspected) exposure to covid-19: Secondary | ICD-10-CM | POA: Insufficient documentation

## 2020-02-19 DIAGNOSIS — Z7982 Long term (current) use of aspirin: Secondary | ICD-10-CM | POA: Diagnosis not present

## 2020-02-19 DIAGNOSIS — K219 Gastro-esophageal reflux disease without esophagitis: Secondary | ICD-10-CM

## 2020-02-19 DIAGNOSIS — R079 Chest pain, unspecified: Secondary | ICD-10-CM | POA: Diagnosis not present

## 2020-02-19 DIAGNOSIS — N184 Chronic kidney disease, stage 4 (severe): Secondary | ICD-10-CM | POA: Diagnosis not present

## 2020-02-19 DIAGNOSIS — I129 Hypertensive chronic kidney disease with stage 1 through stage 4 chronic kidney disease, or unspecified chronic kidney disease: Secondary | ICD-10-CM | POA: Diagnosis not present

## 2020-02-19 LAB — BASIC METABOLIC PANEL
Anion gap: 10 (ref 5–15)
BUN: 26 mg/dL — ABNORMAL HIGH (ref 8–23)
CO2: 22 mmol/L (ref 22–32)
Calcium: 9.8 mg/dL (ref 8.9–10.3)
Chloride: 110 mmol/L (ref 98–111)
Creatinine, Ser: 1.51 mg/dL — ABNORMAL HIGH (ref 0.44–1.00)
GFR, Estimated: 35 mL/min — ABNORMAL LOW (ref >60)
Glucose, Bld: 102 mg/dL — ABNORMAL HIGH (ref 70–99)
Potassium: 5 mmol/L (ref 3.5–5.1)
Sodium: 142 mmol/L (ref 135–145)

## 2020-02-19 LAB — CBC
HCT: 37.2 % (ref 36.0–46.0)
Hemoglobin: 12.5 g/dL (ref 12.0–15.0)
MCH: 29.2 pg (ref 26.0–34.0)
MCHC: 33.6 g/dL (ref 30.0–36.0)
MCV: 86.9 fL (ref 80.0–100.0)
Platelets: 159 10*3/uL (ref 150–400)
RBC: 4.28 MIL/uL (ref 3.87–5.11)
RDW: 13.8 % (ref 11.5–15.5)
WBC: 4.5 10*3/uL (ref 4.0–10.5)
nRBC: 0 % (ref 0.0–0.2)

## 2020-02-19 LAB — HEPATIC FUNCTION PANEL
ALT: 28 U/L (ref 0–44)
AST: 40 U/L (ref 15–41)
Albumin: 3.8 g/dL (ref 3.5–5.0)
Alkaline Phosphatase: 97 U/L (ref 38–126)
Bilirubin, Direct: 0.1 mg/dL (ref 0.0–0.2)
Indirect Bilirubin: 0.8 mg/dL (ref 0.3–0.9)
Total Bilirubin: 0.9 mg/dL (ref 0.3–1.2)
Total Protein: 7.3 g/dL (ref 6.5–8.1)

## 2020-02-19 LAB — LIPASE, BLOOD: Lipase: 39 U/L (ref 11–51)

## 2020-02-19 LAB — RESP PANEL BY RT-PCR (FLU A&B, COVID) ARPGX2
Influenza A by PCR: NEGATIVE
Influenza B by PCR: NEGATIVE
SARS Coronavirus 2 by RT PCR: NEGATIVE

## 2020-02-19 LAB — TROPONIN I (HIGH SENSITIVITY)
Troponin I (High Sensitivity): 5 ng/L (ref <18)
Troponin I (High Sensitivity): 6 ng/L (ref <18)

## 2020-02-19 MED ORDER — SUCRALFATE 1 G PO TABS: 1.0000 g | ORAL_TABLET | Freq: Four times a day (QID) | ORAL | 1 refills | Status: DC

## 2020-02-19 MED ORDER — METOCLOPRAMIDE HCL 5 MG PO TABS
5.0000 mg | ORAL_TABLET | Freq: Two times a day (BID) | ORAL | 0 refills | Status: DC
Start: 2020-02-19 — End: 2021-01-03

## 2020-02-19 NOTE — Telephone Encounter (Signed)
Patient was sent to access nurse. Awaiting note.

## 2020-02-19 NOTE — ED Provider Notes (Addendum)
[facility] Emergency Department Provider Note  ____________________________________________  Time seen: Approximately 3:54 PM  I have reviewed the triage vital signs and the nursing notes.   HISTORY  Chief Complaint Chest Pain    HPI Caitlin Lin is a 79 y.o. female with a history of GERD, spinal stenosis, CKD who comes the ED complaining of a feeling of chest pressure that is worse with letting her hands rest on her chest while sleeping at night.  Not exertional, not pleuritic.  Intermittent, comes and goes at random but seems to be worse at night lying down..  Nonradiating.  No alleviating factors.  No vomiting or diaphoresis.  Denies being short of breath but describes having a feeling of needing to take a deep breath every now and then throughout the day.  Currently pain is resolved.  She has been compliant with her medications, eating normally.  She notes that when she has fried food or spicy food it seems to make her feel worse.  EMR reviewed, CT scan from June 2021 overall unremarkable with aortic atherosclerosis, no AAA.  TTE from 1 year ago essentially normal.  EGD from August 2021 essentially normal except for multiple small sessile polyps.      Past Medical History:  Diagnosis Date  . Cataracts, bilateral   . Chronic kidney disease    Followed by Dr. Holley Raring  . Chronic kidney disease   . Colon polyp   . Constipation due to slow transit   . Diarrhea   . Diarrhea   . GERD (gastroesophageal reflux disease)   . Heart murmur   . History of cyst of breast   . History of hiatal hernia   . Hyperlipidemia   . Hypertension   . Hypothyroidism   . Melanoma of lower leg ([place]) 2013  . Shingles 2013  . Shingles   . Spinal stenosis in cervical region    Dr. Sharlet Salina  . Subdural hematoma ([place])   . Thyroid disease      Patient Active Problem List   Diagnosis Date Noted  . Dysphagia 08/09/2019  . Sleep difficulties 11/23/2018  . Numbness of  toes 11/23/2018  . Pelvic pressure in female 08/28/2018  . Hyperglycemia 11/20/2017  . Aortic atherosclerosis ([place]) 06/16/2017  . Low back pain 06/06/2017  . Abdominal pain 06/03/2017  . Fatty liver 02/13/2017  . Aortic regurgitation 07/01/2016  . Health care maintenance 05/12/2016  . Chest pain 05/12/2016  . Angular cheilitis 03/27/2015  . TMJ arthralgia 09/25/2014  . GERD (gastroesophageal reflux disease) 12/05/2013  . Osteopenia 10/30/2013  . Hypercholesterolemia 03/04/2013  . Medicare annual wellness visit, subsequent 09/05/2012  . Anemia, iron deficiency 06/08/2012  . Screening for breast cancer 08/03/2011  . Rectal prolapse 08/03/2011  . Hypertension 02/09/2011  . CKD (chronic kidney disease) stage 4, GFR 15-29 ml/min (HCC) 02/09/2011  . Hypothyroidism 02/09/2011     Past Surgical History:  Procedure Laterality Date  . ABDOMINAL HYSTERECTOMY  1974  . ANTERIOR AND POSTERIOR VAGINAL REPAIR    . ANTERIOR AND POSTERIOR VAGINAL REPAIR W/ SACROSPINOUS LIGAMENT SUSPENSION Right   . BREAST BIOPSY  1963   Benign per pt's report  . COLONOSCOPY    . ELECTROMAGNETIC NAVIGATION BROCHOSCOPY Right 01/09/2019   Procedure: ELECTROMAGNETIC NAVIGATION BRONCHOSCOPY;  Surgeon: Tyler Pita, MD;  Location: [facility] ORS;  Service: Cardiopulmonary;  Laterality: Right;  . ESOPHAGOGASTRODUODENOSCOPY    . ESOPHAGOGASTRODUODENOSCOPY (EGD) WITH PROPOFOL N/A 11/10/2016   Procedure: ESOPHAGOGASTRODUODENOSCOPY (EGD) WITH PROPOFOL;  Surgeon: Gustavo Lah,  Billie Ruddy, MD;  Location: [facility] ENDOSCOPY;  Service: Endoscopy;  Laterality: N/A;  . ESOPHAGOGASTRODUODENOSCOPY (EGD) WITH PROPOFOL N/A 09/09/2018   Procedure: ESOPHAGOGASTRODUODENOSCOPY (EGD) WITH PROPOFOL;  Surgeon: Lollie Sails, MD;  Location: [facility] ENDOSCOPY;  Service: Endoscopy;  Laterality: N/A;  . ESOPHAGOGASTRODUODENOSCOPY (EGD) WITH PROPOFOL N/A 10/27/2019   Procedure: ESOPHAGOGASTRODUODENOSCOPY (EGD) WITH PROPOFOL;  Surgeon: Robert Bellow, MD;  Location: [facility] ENDOSCOPY;  Service: Endoscopy;  Laterality: N/A;  . LUNG BIOPSY  2007   Benign per pt's report  . LUNG BIOPSY Right   . OOPHORECTOMY  1970  . PERINEOPLASTY  12/17/2013  . sling for stress incontinence  12/18/2011     Prior to Admission medications   Medication Sig Start Date End Date Taking? Authorizing Provider  amLODipine (NORVASC) 5 MG tablet Take 5 mg by mouth daily.    [provider]  aspirin 81 MG tablet Take 1 tablet (81 mg total) by mouth daily. Patient taking differently: Take 81 mg by mouth at bedtime.  10/15/15   Jackolyn Confer, MD  atorvastatin (LIPITOR) 20 MG tablet Take 1 tablet by mouth once daily 11/27/19   Einar Pheasant, MD  calcium carbonate (OSCAL) 1500 (600 Ca) MG TABS tablet Take 600 mg by mouth daily with breakfast.    [provider]  Cholecalciferol 25 MCG (1000 UT) capsule Take 2,000 Units by mouth daily.     [provider]  Cranberry 400 MG TABS Take 400 mg by mouth daily.    [provider]  docusate sodium (COLACE) 100 MG capsule Take 100 mg by mouth at bedtime.    [provider]  estradiol (ESTRACE VAGINAL) 0.1 MG/GM vaginal cream Place 1 Applicatorful vaginally 3 (three) times a week.    [provider]  EUTHYROX 75 MCG tablet Take 1 tablet by mouth once daily with breakfast 12/18/19   Einar Pheasant, MD  fexofenadine-pseudoephedrine (ALLEGRA-D 24) 180-240 MG 24 hr tablet Take 1 tablet by mouth daily.    [provider]  glucosamine-chondroitin 500-400 MG tablet Take 1 tablet by mouth 3 (three) times daily. Patient taking differently: Take 2 tablets by mouth daily.  10/15/15   Jackolyn Confer, MD  losartan (COZAAR) 50 MG tablet Take 1 tablet by mouth once daily 02/02/20   Einar Pheasant, MD  metoCLOPramide (REGLAN) 5 MG tablet Take 5 mg by mouth 2 (two) times daily.    [provider]  Multiple Vitamin (MULTIVITAMIN) tablet Take 1 tablet by mouth daily.     [provider]  Omega-3 Fatty Acids (FISH OIL) 1000 MG CAPS Take 1,000 mg by mouth daily.    [provider]  pantoprazole (PROTONIX) 40 MG tablet One tablet 30 minutes before breakfast and one tablet 30 minutes before your evening meal - prn. 12/22/19   Einar Pheasant, MD  Probiotic Product (PROBIOTIC DAILY PO) Take by mouth daily.    [provider]  sucralfate (CARAFATE) 1 g tablet Take 1 tablet (1 g total) by mouth 4 (four) times daily. 02/19/20   Carrie Mew, MD     Allergies Quinapril and Codeine   Family History  Problem Relation Age of Onset  . Arthritis Mother   . Liver disease Mother   . Cirrhosis Mother   . Liver cancer Mother   . Heart disease Father   . Heart attack Father   . Coronary artery disease Father   . Depression Brother   . Breast cancer Other   . Breast cancer Cousin  maternal cousins  . Liver cancer Cousin   . Diabetes Maternal Aunt     Social History Social History   Tobacco Use  . Smoking status: Never Smoker  . Smokeless tobacco: Never Used  Vaping Use  . Vaping Use: Never used  Substance Use Topics  . Alcohol use: No  . Drug use: No    Review of Systems  Constitutional:   No fever or chills.  ENT:   No sore throat. No rhinorrhea. Cardiovascular: Positive chest pain as above without syncope. Respiratory:   No dyspnea or cough. Gastrointestinal:   Negative for abdominal pain, vomiting and diarrhea.  Musculoskeletal:   Negative for focal pain or swelling All other systems reviewed and are negative except as documented above in ROS and HPI.  ____________________________________________   PHYSICAL EXAM:  VITAL SIGNS: ED Triage Vitals  Enc Vitals Group     BP 02/19/20 1154 (!) 152/70     Pulse Rate 02/19/20 1154 73     Resp 02/19/20 1154 18     Temp 02/19/20 1154 98.4 F (36.9 C)     Temp Source 02/19/20 1154 Oral     SpO2 02/19/20 1154 98 %     Weight 02/19/20 1151 160 lb (72.6 kg)      Height 02/19/20 1151 5\' 4"  (1.626 m)     Head Circumference --      Peak Flow --      Pain Score 02/19/20 1151 6     Pain Loc --      Pain Edu? --      Excl. in [place]? --     Vital signs reviewed, nursing assessments reviewed.   Constitutional:   Alert and oriented. Non-toxic appearance. Eyes:   Conjunctivae are normal. EOMI. PERRL. ENT      Head:   Normocephalic and atraumatic.      Nose:   Wearing a mask.      Mouth/Throat:   Wearing a mask.      Neck:   No meningismus. Full ROM. Hematological/Lymphatic/Immunilogical:   No cervical lymphadenopathy. Cardiovascular:   RRR. Symmetric bilateral radial and DP pulses.  No murmurs. Cap refill less than 2 seconds. Respiratory:   Normal respiratory effort without tachypnea/retractions. Breath sounds are clear and equal bilaterally. No wheezes/rales/rhonchi. Gastrointestinal:   Soft with mild epigastric and left upper quadrant tenderness reproducing her pain. Non distended. There is no CVA tenderness.  No rebound, rigidity, or guarding. Musculoskeletal:   Normal range of motion in all extremities. No joint effusions.  No lower extremity tenderness.  No edema.  There is mild anterior chest wall tenderness Neurologic:   Normal speech and language.  Motor grossly intact. No acute focal neurologic deficits are appreciated.  Skin:    Skin is warm, dry and intact. No rash noted.  No petechiae, purpura, or bullae.  ____________________________________________    LABS (pertinent positives/negatives) (all labs ordered are listed, but only abnormal results are displayed) Labs Reviewed  BASIC METABOLIC PANEL - Abnormal; Notable for the following components:      Result Value   Glucose, Bld 102 (*)    BUN 26 (*)    Creatinine, Ser 1.51 (*)    GFR, Estimated 35 (*)    All other components within normal limits  RESP PANEL BY RT-PCR (FLU A&B, COVID) ARPGX2  CBC  HEPATIC FUNCTION PANEL  LIPASE, BLOOD  TROPONIN I (HIGH SENSITIVITY)  TROPONIN I  (HIGH SENSITIVITY)   ____________________________________________   EKG  Interpreted by me  Normal sinus rhythm rate of 68, normal axis and intervals.  Normal QRS ST segments and T waves.  No acute ischemic changes.  ____________________________________________    [id number]  DG Chest 2 View  Result Date: 02/19/2020 CLINICAL DATA:  Chest pain midline chest plain for 2-3 weeks with weakness EXAM: CHEST - 2 VIEW COMPARISON:  January 09, 2019 FINDINGS: Trachea midline. Cardiomediastinal contours and hilar structures are normal. No lobar consolidation. Small nodules in the chest are improved in the RIGHT mid chest when compared to the exam of October of 2020, otherwise with similar appearance to prior imaging, not as well seen as on prior CT evaluations. No effusion. On limited assessment no acute skeletal process. IMPRESSION: No acute cardiopulmonary disease. Known pulmonary nodules not well seen on the current exam. Electronically Signed   By: Zetta Bills M.D.   On: 02/19/2020 12:27    ____________________________________________   PROCEDURES Procedures  ____________________________________________  DIFFERENTIAL DIAGNOSIS   Musculoskeletal chest wall pain, costochondritis, GERD, pancreatitis, biliary disease, non-STEMI  CLINICAL IMPRESSION / ASSESSMENT AND PLAN / ED COURSE  Medications ordered in the ED: Medications - No data to display  Pertinent labs & imaging results that were available during my care of the patient were reviewed by me and considered in my medical decision making (see chart for details).  Caitlin Lin was evaluated in Emergency Department on 02/19/2020 for the symptoms described in the history of present illness. She was evaluated in the context of the global COVID-19 pandemic, which necessitated consideration that the patient might be at risk for infection with the SARS-CoV-2 virus that causes COVID-19. Institutional protocols and algorithms that pertain  to the evaluation of patients at risk for COVID-19 are in a state of rapid change based on information released by regulatory bodies including the CDC and federal and state organizations. These policies and algorithms were followed during the patient's care in the ED.   Patient presents with atypical chest discomfort, currently resolved.  Vital signs and EKG unremarkable, exam is reassuring and suggestive of a abdominal cause, most likely GERD based on history versus musculoskeletal chest pain.  Initial labs including troponin normal except for baseline CKD.  Due to age will obtain serial troponins, check LFTs and lipase.  Clinical Course as of Feb 18 1657  Mon Feb 19, 2020  1601 Second trop normal, reassuring.    [PS]    Clinical Course User Index [PS] Carrie Mew, MD     ----------------------------------------- 4:58 PM on 02/19/2020 -----------------------------------------  Lipase and LFTs normal.  Vital signs remain unremarkable.  Continue her PPI and Reglan, add on Carafate, follow-up with primary care.  If symptoms not resolved in the next few days, recommended she schedule follow-up with her cardiologist as well.  ____________________________________________   FINAL CLINICAL IMPRESSION(S) / ED DIAGNOSES    Final diagnoses:  Atypical chest pain  Gastroesophageal reflux disease without esophagitis     ED Discharge Orders         Ordered    sucralfate (CARAFATE) 1 g tablet  4 times daily        02/19/20 1554          Portions of this note were generated with dragon dictation software. Dictation errors may occur despite best attempts at proofreading.   Carrie Mew, MD 02/19/20 1659    Carrie Mew, MD 02/19/20 1700

## 2020-02-19 NOTE — Discharge Instructions (Addendum)
Your lab tests today were all okay.  Please start taking Carafate as prescribed and follow up with your doctor.

## 2020-02-19 NOTE — Telephone Encounter (Signed)
I am not in the office. Please triage this and confirm what is going on.

## 2020-02-19 NOTE — ED Triage Notes (Signed)
Pt here with CP and Sob that has been occurring for several days but has gotten worse. Pt states that when she gets up to do to the bathroom she gets very clammy. Pt NAD in triage.

## 2020-02-19 NOTE — Telephone Encounter (Addendum)
PATIENT CALLED IN SHORTNESS OF BREATH SWOLLEN ANKLES ,PRESSURE IN CHEST sent to access nurse

## 2020-02-19 NOTE — Telephone Encounter (Signed)
EMS told her to go be evaluated at her PCP office for covid test and an xray. Spoken to patient, instructed her to go to [facility] to be evaluated, and if they believe it is warranted to go to ED she will be in the same facility.    [address] [facility] [place] RECORD AccessNurse Patient Name: Caitlin Lin Gender: Female DOB: Dec 12, 1940 Age: 79 Y 10 M 1 D Return Phone Number: [phone] (Primary) Address: City/State/Zip: [place] [address] Client [facility] [place] Client Site [place] - Day Physician Einar Pheasant - MD Contact Type Call Who Is Calling Patient / Member / Family / Caregiver Call Type Triage / Clinical Relationship To Patient Self Return Phone Number [phone] (Primary) Chief Complaint BREATHING - shortness of breath or sounds breathless Reason for Call Symptomatic / Request for [place] states she has SOB and chest pressure. Translation No Nurse Assessment Nurse: Radford Pax, RN, Eugene Garnet Date/Time (Eastern Time): 02/19/2020 8:59:41 AM Confirm and document reason for call. If symptomatic, describe symptoms. ---Caller states that she is having SOB and chest pressure. Weakness, swelling in ankles. Has a cough in the morning. Gradually getting worse over the course of a few weeks, worse past few days. Does the patient have any new or worsening symptoms? ---Yes Will a triage be completed? ---Yes Related visit to physician within the last 2 weeks? ---No Does the PT have any chronic conditions? (i.e. diabetes, asthma, this includes High risk factors for pregnancy, etc.) ---Yes List chronic conditions. ---HTN, kidney disease, lung infection Is this a behavioral health or substance abuse call? ---No Guidelines Guideline Title Affirmed Question Affirmed Notes Nurse Date/Time Eilene Ghazi Time) Chest Pain [1] Chest pain lasts > 5 minutes  AND [2] age > 61 Turner, RN, Rebekah 02/19/2020 9:01:29 AM Disp. Time Eilene Ghazi Time) Disposition Final User 02/19/2020 8:57:25 AM Send to Urgent Merrily Pew 02/19/2020 9:04:01 AM Send To RN Personal Radford Pax, RN, Rebekah 02/19/2020 9:13:04 AM 911 Outcome Documentation Turner, RN, Eugene Garnet PLEASE NOTE: All timestamps contained within this report are represented as [place] Standard Time. CONFIDENTIALTY NOTICE: This fax transmission is intended only for the addressee. It contains information that is legally privileged, confidential or otherwise protected from use or disclosure. If you are not the intended recipient, you are strictly prohibited from reviewing, disclosing, copying using or disseminating any of this information or taking any action in reliance on or regarding this information. If you have received this fax in error, please notify us immediately by telephone so that we can arrange for its return to [place]. Phone: [phone], Toll-Free: [phone], Fax: [phone] Page: 2 of 2 Call Id: [id number] [place]. Time (Eastern Time) Disposition Final User Reason: Upon follow up with pt, the firetrucks had just arrived and were coming in to evaluate her. 02/19/2020 9:03:11 AM Call EMS 911 Now Yes Turner, RN, Eugene Garnet Caller Disagree/Comply Comply Caller Understands Yes PreDisposition Call Doctor Care Advice Given Per Guideline CALL EMS 911 NOW: * Immediate medical attention is needed. You need to hang up and call 911 (or an ambulance). * Triager Discretion: I'll call you back in a few minutes to be sure you were able to reach them. CARE ADVICE given per Chest Pain (Adult) guideline.

## 2020-02-26 ENCOUNTER — Other Ambulatory Visit: Payer: Self-pay | Admitting: Internal Medicine

## 2020-03-04 DIAGNOSIS — N1832 Chronic kidney disease, stage 3b: Secondary | ICD-10-CM | POA: Diagnosis not present

## 2020-03-04 DIAGNOSIS — M4802 Spinal stenosis, cervical region: Secondary | ICD-10-CM | POA: Diagnosis not present

## 2020-03-04 DIAGNOSIS — G608 Other hereditary and idiopathic neuropathies: Secondary | ICD-10-CM | POA: Diagnosis not present

## 2020-03-19 ENCOUNTER — Other Ambulatory Visit: Payer: Self-pay | Admitting: Internal Medicine

## 2020-03-27 ENCOUNTER — Other Ambulatory Visit: Payer: Self-pay

## 2020-03-27 ENCOUNTER — Other Ambulatory Visit (INDEPENDENT_AMBULATORY_CARE_PROVIDER_SITE_OTHER): Payer: PPO

## 2020-03-27 DIAGNOSIS — R739 Hyperglycemia, unspecified: Secondary | ICD-10-CM

## 2020-03-27 DIAGNOSIS — E7800 Pure hypercholesterolemia, unspecified: Secondary | ICD-10-CM

## 2020-03-27 DIAGNOSIS — D509 Iron deficiency anemia, unspecified: Secondary | ICD-10-CM | POA: Diagnosis not present

## 2020-03-27 DIAGNOSIS — Z1159 Encounter for screening for other viral diseases: Secondary | ICD-10-CM | POA: Diagnosis not present

## 2020-03-27 LAB — CBC WITH DIFFERENTIAL/PLATELET
Basophils Absolute: 0 10*3/uL (ref 0.0–0.1)
Basophils Relative: 0.8 % (ref 0.0–3.0)
Eosinophils Absolute: 0.1 10*3/uL (ref 0.0–0.7)
Eosinophils Relative: 2.4 % (ref 0.0–5.0)
HCT: 36.4 % (ref 36.0–46.0)
Hemoglobin: 12.2 g/dL (ref 12.0–15.0)
Lymphocytes Relative: 55.9 % — ABNORMAL HIGH (ref 12.0–46.0)
Lymphs Abs: 2.5 10*3/uL (ref 0.7–4.0)
MCHC: 33.4 g/dL (ref 30.0–36.0)
MCV: 87.5 fl (ref 78.0–100.0)
Monocytes Absolute: 0.6 10*3/uL (ref 0.1–1.0)
Monocytes Relative: 14 % — ABNORMAL HIGH (ref 3.0–12.0)
Neutro Abs: 1.2 10*3/uL — ABNORMAL LOW (ref 1.4–7.7)
Neutrophils Relative %: 26.9 % — ABNORMAL LOW (ref 43.0–77.0)
Platelets: 165 10*3/uL (ref 150.0–400.0)
RBC: 4.16 Mil/uL (ref 3.87–5.11)
RDW: 14.3 % (ref 11.5–15.5)
WBC: 4.4 10*3/uL (ref 4.0–10.5)

## 2020-03-27 LAB — HEPATIC FUNCTION PANEL
ALT: 18 U/L (ref 0–35)
AST: 24 U/L (ref 0–37)
Albumin: 4.1 g/dL (ref 3.5–5.2)
Alkaline Phosphatase: 111 U/L (ref 39–117)
Bilirubin, Direct: 0.1 mg/dL (ref 0.0–0.3)
Total Bilirubin: 0.7 mg/dL (ref 0.2–1.2)
Total Protein: 6.6 g/dL (ref 6.0–8.3)

## 2020-03-27 LAB — BASIC METABOLIC PANEL
BUN: 23 mg/dL (ref 6–23)
CO2: 24 mEq/L (ref 19–32)
Calcium: 9.5 mg/dL (ref 8.4–10.5)
Chloride: 109 mEq/L (ref 96–112)
Creatinine, Ser: 1.54 mg/dL — ABNORMAL HIGH (ref 0.40–1.20)
GFR: 32.04 mL/min — ABNORMAL LOW (ref >60.00)
Glucose, Bld: 96 mg/dL (ref 70–99)
Potassium: 4.6 mEq/L (ref 3.5–5.1)
Sodium: 141 mEq/L (ref 135–145)

## 2020-03-27 LAB — LIPID PANEL
Cholesterol: 154 mg/dL (ref 0–200)
HDL: 37.5 mg/dL — ABNORMAL LOW (ref >39.00)
LDL Cholesterol: 81 mg/dL (ref 0–99)
NonHDL: 116.2
Total CHOL/HDL Ratio: 4
Triglycerides: 175 mg/dL — ABNORMAL HIGH (ref 0.0–149.0)
VLDL: 35 mg/dL (ref 0.0–40.0)

## 2020-03-27 LAB — HEMOGLOBIN A1C: Hgb A1c MFr Bld: 5.5 % (ref 4.6–6.5)

## 2020-03-28 LAB — HEPATITIS C ANTIBODY
Hepatitis C Ab: NONREACTIVE
SIGNAL TO CUT-OFF: 0.04 (ref <1.00)

## 2020-03-29 ENCOUNTER — Other Ambulatory Visit: Payer: Self-pay

## 2020-03-29 ENCOUNTER — Ambulatory Visit (INDEPENDENT_AMBULATORY_CARE_PROVIDER_SITE_OTHER): Payer: PPO | Admitting: Internal Medicine

## 2020-03-29 DIAGNOSIS — R079 Chest pain, unspecified: Secondary | ICD-10-CM

## 2020-03-29 DIAGNOSIS — E7800 Pure hypercholesterolemia, unspecified: Secondary | ICD-10-CM | POA: Diagnosis not present

## 2020-03-29 DIAGNOSIS — N184 Chronic kidney disease, stage 4 (severe): Secondary | ICD-10-CM | POA: Diagnosis not present

## 2020-03-29 DIAGNOSIS — I351 Nonrheumatic aortic (valve) insufficiency: Secondary | ICD-10-CM

## 2020-03-29 DIAGNOSIS — K760 Fatty (change of) liver, not elsewhere classified: Secondary | ICD-10-CM | POA: Diagnosis not present

## 2020-03-29 DIAGNOSIS — M7989 Other specified soft tissue disorders: Secondary | ICD-10-CM | POA: Diagnosis not present

## 2020-03-29 DIAGNOSIS — R5383 Other fatigue: Secondary | ICD-10-CM | POA: Diagnosis not present

## 2020-03-29 DIAGNOSIS — I700 Atherosclerosis of aorta: Secondary | ICD-10-CM

## 2020-03-29 DIAGNOSIS — I10 Essential (primary) hypertension: Secondary | ICD-10-CM

## 2020-03-29 DIAGNOSIS — E039 Hypothyroidism, unspecified: Secondary | ICD-10-CM | POA: Diagnosis not present

## 2020-03-29 DIAGNOSIS — K219 Gastro-esophageal reflux disease without esophagitis: Secondary | ICD-10-CM

## 2020-03-29 DIAGNOSIS — R739 Hyperglycemia, unspecified: Secondary | ICD-10-CM | POA: Diagnosis not present

## 2020-03-29 MED ORDER — SERTRALINE HCL 50 MG PO TABS: ORAL_TABLET | ORAL | 1 refills | Status: DC

## 2020-03-29 NOTE — Progress Notes (Signed)
Patient ID: Caitlin Lin, female   DOB: [birth year], 81 y.o.   MRN: [id number]   Subjective:    Patient ID: Caitlin Lin, female    DOB: 11-29-1940, 80 y.o.   MRN: [id number]  HPI This visit occurred during the SARS-CoV-2 public health emergency.  Safety protocols were in place, including screening questions prior to the visit, additional usage of staff PPE, and extensive cleaning of exam room while observing appropriate contact time as indicated for disinfecting solutions.  Patient here for a scheduled follow up.  Here to follow up regarding her blood pressure and cholesterol.  She reports decreased energy. Had to rest - taking a shower.  Was seen recently in ER for chest pain - described as a weight on her chest.  Work up unrevealing. Felt to possibly be from GI origin - given carafate.  Not taking.  Denies any increased acid reflux.  No nausea or vomiting.  No abdominal pain.  Decreased energy with exertion.  No increased cough or congestion.  Some lower extremity swelling - left > right - mostly localized to ankle. Feels depressed.  Discussed.  Feels needs something to help level things out.  Discussed treatment options.    Past Medical History:  Diagnosis Date  . Cataracts, bilateral   . Chronic kidney disease    Followed by Dr. Holley Raring  . Chronic kidney disease   . Colon polyp   . Constipation due to slow transit   . Diarrhea   . Diarrhea   . GERD (gastroesophageal reflux disease)   . Heart murmur   . History of cyst of breast   . History of hiatal hernia   . Hyperlipidemia   . Hypertension   . Hypothyroidism   . Melanoma of lower leg ([place]) 2013  . Shingles 2013  . Shingles   . Spinal stenosis in cervical region    Dr. Sharlet Salina  . Subdural hematoma ([place])   . Thyroid disease    Past Surgical History:  Procedure Laterality Date  . ABDOMINAL HYSTERECTOMY  1974  . ANTERIOR AND POSTERIOR VAGINAL REPAIR    . ANTERIOR AND POSTERIOR VAGINAL REPAIR W/ SACROSPINOUS LIGAMENT  SUSPENSION Right   . BREAST BIOPSY  1963   Benign per pt's report  . COLONOSCOPY    . ELECTROMAGNETIC NAVIGATION BROCHOSCOPY Right 01/09/2019   Procedure: ELECTROMAGNETIC NAVIGATION BRONCHOSCOPY;  Surgeon: Tyler Pita, MD;  Location: [facility] ORS;  Service: Cardiopulmonary;  Laterality: Right;  . ESOPHAGOGASTRODUODENOSCOPY    . ESOPHAGOGASTRODUODENOSCOPY (EGD) WITH PROPOFOL N/A 11/10/2016   Procedure: ESOPHAGOGASTRODUODENOSCOPY (EGD) WITH PROPOFOL;  Surgeon: Lollie Sails, MD;  Location[facility] ENDOSCOPY;  Service: Endoscopy;  Laterality: N/A;  . ESOPHAGOGASTRODUODENOSCOPY (EGD) WITH PROPOFOL N/A 09/09/2018   Procedure: ESOPHAGOGASTRODUODENOSCOPY (EGD) WITH PROPOFOL;  Surgeon: Lollie Sails, MD;  Location: [facility] ENDOSCOPY;  Service: Endoscopy;  Laterality: N/A;  . ESOPHAGOGASTRODUODENOSCOPY (EGD) WITH PROPOFOL N/A 10/27/2019   Procedure: ESOPHAGOGASTRODUODENOSCOPY (EGD) WITH PROPOFOL;  Surgeon: Robert Bellow, MD;  Location: [facility] ENDOSCOPY;  Service: Endoscopy;  Laterality: N/A;  . LUNG BIOPSY  2007   Benign per pt's report  . LUNG BIOPSY Right   . OOPHORECTOMY  1970  . PERINEOPLASTY  12/17/2013  . sling for stress incontinence  12/18/2011   Family History  Problem Relation Age of Onset  . Arthritis Mother   . Liver disease Mother   . Cirrhosis Mother   . Liver cancer Mother   . Heart disease Father   . Heart attack Father   .  Coronary artery disease Father   . Depression Brother   . Breast cancer Other   . Breast cancer Cousin        maternal cousins  . Liver cancer Cousin   . Diabetes Maternal Aunt    Social History   Socioeconomic History  . Marital status: Married    Spouse name: Not on file  . Number of children: 2  . Years of education: Not on file  . Highest education level: Not on file  Occupational History  . Occupation: Retired  Tobacco Use  . Smoking status: Never Smoker  . Smokeless tobacco: Never Used  Vaping Use  . Vaping Use: Never used   Substance and Sexual Activity  . Alcohol use: No  . Drug use: No  . Sexual activity: Yes  Other Topics Concern  . Not on file  Social History Narrative   Lives in [place] with husband. Has 2 children boy and girl, 4 grandchildren. Retired Presenter, broadcasting.       Daily Caffeine Use:  NO   Regular Exercise -  Not at this time due to back pain, would like to resume soon   Social Determinants of Health   Financial Resource Strain: Low Risk   . Difficulty of Paying Living Expenses: Not hard at all  Food Insecurity: No Food Insecurity  . Worried About Charity fundraiser in the Last Year: Never true  . Ran Out of Food in the Last Year: Never true  Transportation Needs: No Transportation Needs  . Lack of Transportation (Medical): No  . Lack of Transportation (Non-Medical): No  Physical Activity: Not on file  Stress: No Stress Concern Present  . Feeling of Stress : Not at all  Social Connections: Not on file    Outpatient Encounter Medications as of 03/29/2020  Medication Sig  . sertraline (ZOLOFT) 50 MG tablet Take 1/2 tablet q day x 2 weeks and then one po q day.  Marland Kitchen amLODipine (NORVASC) 5 MG tablet Take 5 mg by mouth daily.  Marland Kitchen aspirin 81 MG tablet Take 1 tablet (81 mg total) by mouth daily. (Patient taking differently: Take 81 mg by mouth at bedtime. )  . atorvastatin (LIPITOR) 20 MG tablet Take 1 tablet by mouth once daily  . calcium carbonate (OSCAL) 1500 (600 Ca) MG TABS tablet Take 600 mg by mouth daily with breakfast.  . Cholecalciferol 25 MCG (1000 UT) capsule Take 2,000 Units by mouth daily.   . Cranberry 400 MG TABS Take 400 mg by mouth daily.  Arna Medici 75 MCG tablet Take 1 tablet by mouth once daily with breakfast  . fexofenadine-pseudoephedrine (ALLEGRA-D 24) 180-240 MG 24 hr tablet Take 1 tablet by mouth daily.  Marland Kitchen glucosamine-chondroitin 500-400 MG tablet Take 1 tablet by mouth 3 (three) times daily. (Patient taking differently: Take 2 tablets by mouth daily. )  . losartan  (COZAAR) 50 MG tablet Take 1 tablet by mouth once daily  . metoCLOPramide (REGLAN) 5 MG tablet Take 1 tablet (5 mg total) by mouth 2 (two) times daily.  . Multiple Vitamin (MULTIVITAMIN) tablet Take 1 tablet by mouth daily.  . Omega-3 Fatty Acids (FISH OIL) 1000 MG CAPS Take 1,000 mg by mouth daily.  . pantoprazole (PROTONIX) 40 MG tablet One tablet 30 minutes before breakfast and one tablet 30 minutes before your evening meal - prn.  . Probiotic Product (PROBIOTIC DAILY PO) Take by mouth daily.  . [DISCONTINUED] docusate sodium (COLACE) 100 MG capsule Take 100 mg by  mouth at bedtime.  . [DISCONTINUED] estradiol (ESTRACE VAGINAL) 0.1 MG/GM vaginal cream Place 1 Applicatorful vaginally 3 (three) times a week.  . [DISCONTINUED] sucralfate (CARAFATE) 1 g tablet Take 1 tablet (1 g total) by mouth 4 (four) times daily.   No facility-administered encounter medications on file as of 03/29/2020.    Review of Systems  Constitutional: Negative for appetite change and unexpected weight change.  HENT: Negative for congestion and sinus pressure.   Respiratory: Negative for cough and chest tightness.   Cardiovascular: Positive for chest pain and leg swelling. Negative for palpitations.  Gastrointestinal: Negative for abdominal pain, diarrhea, nausea and vomiting.  Genitourinary: Negative for difficulty urinating and dysuria.  Musculoskeletal: Negative for joint swelling and myalgias.  Skin: Negative for color change and rash.  Neurological: Negative for dizziness, light-headedness and headaches.  Psychiatric/Behavioral: Negative for agitation and dysphoric mood.       Objective:    Physical Exam Vitals reviewed.  Constitutional:      General: She is not in acute distress.    Appearance: Normal appearance.  HENT:     Head: Normocephalic and atraumatic.     Right Ear: External ear normal.     Left Ear: External ear normal.     Mouth/Throat:     Mouth: Oropharynx is clear and moist.  Eyes:      General: No scleral icterus.       Right eye: No discharge.        Left eye: No discharge.     Conjunctiva/sclera: Conjunctivae normal.  Neck:     Thyroid: No thyromegaly.  Cardiovascular:     Rate and Rhythm: Normal rate and regular rhythm.  Pulmonary:     Effort: No respiratory distress.     Breath sounds: Normal breath sounds. No wheezing.  Abdominal:     General: Bowel sounds are normal.     Palpations: Abdomen is soft.     Tenderness: There is no abdominal tenderness.  Musculoskeletal:        General: No tenderness or edema.     Cervical back: Neck supple. No tenderness.     Comments: Increased swelling - left ankle and just below mid calf.  No increased erythema.  No calf tenderness and no swelling up leg.    Lymphadenopathy:     Cervical: No cervical adenopathy.  Skin:    Findings: No erythema or rash.  Neurological:     Mental Status: She is alert.  Psychiatric:        Mood and Affect: Mood normal.        Behavior: Behavior normal.     BP 132/70   Pulse 72   Temp 97.8 F (36.6 C) (Oral)   Resp 16   Ht '5\' 4"'  (1.626 m)   Wt 156 lb 9.6 oz (71 kg)   SpO2 97%   BMI 26.88 kg/m  Wt Readings from Last 3 Encounters:  03/29/20 156 lb 9.6 oz (71 kg)  02/19/20 160 lb (72.6 kg)  12/22/19 160 lb (72.6 kg)     Lab Results  Component Value Date   WBC 4.4 03/27/2020   HGB 12.2 03/27/2020   HCT 36.4 03/27/2020   PLT 165.0 03/27/2020   GLUCOSE 96 03/27/2020   CHOL 154 03/27/2020   TRIG 175.0 (H) 03/27/2020   HDL 37.50 (L) 03/27/2020   LDLDIRECT 84.0 07/22/2018   LDLCALC 81 03/27/2020   ALT 18 03/27/2020   AST 24 03/27/2020   NA 141 03/27/2020  K 4.6 03/27/2020   CL 109 03/27/2020   CREATININE 1.54 (H) 03/27/2020   BUN 23 03/27/2020   CO2 24 03/27/2020   TSH 0.85 11/21/2019   HGBA1C 5.5 03/27/2020   MICROALBUR 2.1 (H) 03/27/2015    DG Chest 2 View  Result Date: 02/19/2020 CLINICAL DATA:  Chest pain midline chest plain for 2-3 weeks with weakness EXAM:  CHEST - 2 VIEW COMPARISON:  January 09, 2019 FINDINGS: Trachea midline. Cardiomediastinal contours and hilar structures are normal. No lobar consolidation. Small nodules in the chest are improved in the RIGHT mid chest when compared to the exam of October of 2020, otherwise with similar appearance to prior imaging, not as well seen as on prior CT evaluations. No effusion. On limited assessment no acute skeletal process. IMPRESSION: No acute cardiopulmonary disease. Known pulmonary nodules not well seen on the current exam. Electronically Signed   By: Zetta Bills M.D.   On: 02/19/2020 12:27       Assessment & Plan:   Problem List Items Addressed This Visit    Aortic atherosclerosis ([place])    Continue lipitor.       Aortic regurgitation    ECHO - aortic regurgitation.  With increased fatigue and symptoms as outlined.  EKG - SR with no acute changes when compared to the previous EKG.  Discussed further w/up and evaluation.  Agreeable to f/u with cardiology with questions of need for further cardiac w/up.        Chest pain    Was evaluated ER - chest pain.  Work up unrevealing.  Felt possibly GI origin.  Given carafate.  Off carafate.  No acid reflux reported.  Feels controlled on PPI.  With increased fatigue.  Has to sit down while taking a shower, etc.  EKG - SR with no acute ischemic changes noted.  Has f/u with pulmonary with plans for f/u cxr.  Previous cardiac w/up - [place].  Given change in symptoms, will refer back to cardiology with question of need for further cardiac w/up.        Relevant Orders   EKG 12-Lead (Completed)   Ambulatory referral to Cardiology   CKD (chronic kidney disease) stage 4, GFR 15-29 ml/min (HCC)    Continue to avoid antiinflammatories.  Continue f/u with nephrology.        Fatigue    Increased fatigue as outlined.  Pursue further cardiac w/up as outlined.  Recent labs unrevealing.  Treat depression.  Start zoloft as directed.  Follow closely.  Schedule f/u soon  to reassess.        Relevant Orders   Ambulatory referral to Cardiology   Fatty liver    Diet and exercise.  Follow liver panel.   Lab Results  Component Value Date   ALT 18 03/27/2020   AST 24 03/27/2020   ALKPHOS 111 03/27/2020   BILITOT 0.7 03/27/2020        GERD (gastroesophageal reflux disease)    Denies acid reflux on PPI. Taking daily.  Follow.       Hypercholesterolemia    On lipitor.  Low cholesterol diet and exercise. Follow lipid panel and liver function tests.        Hyperglycemia    Low carb diet and exercise.  Follow met b and a1c.       Hypertension (Chronic)    Blood pressure doing well.  Continue amlodipine and losartan.  Follow pressures.  Follow metabolic panel.       Hypothyroidism (Chronic)  On thyroid replacement.  Follow tsh.       Leg swelling    Leg swelling as outlined.  Does improve with elevation.  Better in am.  No evidence of volume overload.  Compression hose.  Follow.        Relevant Orders   Ambulatory referral to Cardiology       Einar Pheasant, MD

## 2020-03-30 ENCOUNTER — Encounter: Payer: Self-pay | Admitting: Internal Medicine

## 2020-03-30 DIAGNOSIS — R5383 Other fatigue: Secondary | ICD-10-CM | POA: Insufficient documentation

## 2020-03-30 DIAGNOSIS — M7989 Other specified soft tissue disorders: Secondary | ICD-10-CM | POA: Insufficient documentation

## 2020-03-30 NOTE — Assessment & Plan Note (Signed)
Denies acid reflux on PPI. Taking daily.  Follow.

## 2020-03-30 NOTE — Assessment & Plan Note (Signed)
Increased fatigue as outlined.  Pursue further cardiac w/up as outlined.  Recent labs unrevealing.  Treat depression.  Start zoloft as directed.  Follow closely.  Schedule f/u soon to reassess.

## 2020-03-30 NOTE — Assessment & Plan Note (Signed)
Diet and exercise.  Follow liver panel.   Lab Results  Component Value Date   ALT 18 03/27/2020   AST 24 03/27/2020   ALKPHOS 111 03/27/2020   BILITOT 0.7 03/27/2020

## 2020-03-30 NOTE — Assessment & Plan Note (Signed)
Continue lipitor  ?

## 2020-03-30 NOTE — Assessment & Plan Note (Signed)
On lipitor.  Low cholesterol diet and exercise.  Follow lipid panel and liver function tests.   

## 2020-03-30 NOTE — Assessment & Plan Note (Signed)
Low carb diet and exercise.  Follow met b and a1c.  

## 2020-03-30 NOTE — Assessment & Plan Note (Signed)
Blood pressure doing well.  Continue amlodipine and losartan.  Follow pressures.  Follow metabolic panel.

## 2020-03-30 NOTE — Assessment & Plan Note (Signed)
Continue to avoid antiinflammatories.  Continue f/u with nephrology.

## 2020-03-30 NOTE — Assessment & Plan Note (Signed)
On thyroid replacement.  Follow tsh.  

## 2020-03-30 NOTE — Assessment & Plan Note (Signed)
Leg swelling as outlined.  Does improve with elevation.  Better in am.  No evidence of volume overload.  Compression hose.  Follow.

## 2020-03-30 NOTE — Assessment & Plan Note (Signed)
ECHO - aortic regurgitation.  With increased fatigue and symptoms as outlined.  EKG - SR with no acute changes when compared to the previous EKG.  Discussed further w/up and evaluation.  Agreeable to f/u with cardiology with questions of need for further cardiac w/up.

## 2020-03-30 NOTE — Assessment & Plan Note (Signed)
Was evaluated ER - chest pain.  Work up unrevealing.  Felt possibly GI origin.  Given carafate.  Off carafate.  No acid reflux reported.  Feels controlled on PPI.  With increased fatigue.  Has to sit down while taking a shower, etc.  EKG - SR with no acute ischemic changes noted.  Has f/u with pulmonary with plans for f/u cxr.  Previous cardiac w/up - [place].  Given change in symptoms, will refer back to cardiology with question of need for further cardiac w/up.

## 2020-04-04 ENCOUNTER — Encounter: Payer: Self-pay | Admitting: Cardiology

## 2020-04-04 ENCOUNTER — Other Ambulatory Visit: Payer: Self-pay

## 2020-04-04 ENCOUNTER — Ambulatory Visit: Payer: PPO | Admitting: Cardiology

## 2020-04-04 VITALS — BP 110/70 | HR 68 | Ht 64.0 in | Wt 153.0 lb

## 2020-04-04 DIAGNOSIS — E7800 Pure hypercholesterolemia, unspecified: Secondary | ICD-10-CM

## 2020-04-04 DIAGNOSIS — R072 Precordial pain: Secondary | ICD-10-CM

## 2020-04-04 DIAGNOSIS — I10 Essential (primary) hypertension: Secondary | ICD-10-CM

## 2020-04-04 NOTE — Patient Instructions (Addendum)
Medication Instructions:  Your physician recommends that you continue on your current medications as directed. Please refer to the Current Medication list given to you today.  *If you need a refill on your cardiac medications before your next appointment, please call your pharmacy*   Lab Work: None Ordered     Testing/Procedures:  1.  [place]       Your caregiver has ordered a Stress Test with nuclear imaging. The purpose of this test is to evaluate the blood supply to your heart muscle. This procedure is referred to as a "Non-Invasive Stress Test." This is because other than having an IV started in your vein, nothing is inserted or "invades" your body. Cardiac stress tests are done to find areas of poor blood flow to the heart by determining the extent of coronary artery disease (CAD). Some patients exercise on a treadmill, which naturally increases the blood flow to your heart, while others who are  unable to walk on a treadmill due to physical limitations have a pharmacologic/chemical stress agent called Lexiscan . This medicine will mimic walking on a treadmill by temporarily increasing your coronary blood flow.      PLEASE REPORT TO [facility] MEDICAL MALL ENTRANCE   THE VOLUNTEERS AT THE FIRST DESK WILL DIRECT YOU WHERE TO GO     *Please note: these test may take anywhere between 2-4 hours to complete       Date of Procedure:_____________________________________   Arrival Time for Procedure:______________________________    PLEASE NOTIFY THE OFFICE AT LEAST 24 HOURS IN ADVANCE IF YOU ARE UNABLE TO KEEP YOUR APPOINTMENT.  [place] 24 HOURS IN ADVANCE IF YOU ARE UNABLE TO KEEP YOUR APPOINTMENT. [phone]       How to prepare for your Myoview test:     1. Do not eat or drink after midnight  2. No caffeine for 24 hours prior to test  3. No smoking 24 hours prior to test.  4. Unless instructed otherwise, Take your  medication with a small sips of water.    5.         Ladies, please do not wear dresses. Skirts or pants are appropriate. Please wear a short sleeve shirt.  6. No perfume, cologne or lotion.  7. Wear comfortable walking shoes. No heels!       Follow-Up: At [facility], you and your health needs are our priority.  As part of our continuing mission to provide you with exceptional heart care, we have created designated Provider Care Teams.  These Care Teams include your primary Cardiologist (physician) and Advanced Practice Providers (APPs -  Physician Assistants and Nurse Practitioners) who all work together to provide you with the care you need, when you need it.  We recommend signing up for the patient portal called "MyChart".  Sign up information is provided on this After Visit Summary.  MyChart is used to connect with patients for Virtual Visits (Telemedicine).  Patients are able to view lab/test results, encounter notes, upcoming appointments, etc.  Non-urgent messages can be sent to your provider as well.   To learn more about what you can do with MyChart, go to [email or web address].    Your next appointment:   Follow up after Myoview   The format for your next appointment:   In Person  Provider:   Kate Sable, MD   Other Instructions

## 2020-04-04 NOTE — Progress Notes (Signed)
Cardiology Office Note:    Date:  04/04/2020   ID:  Caitlin Lin, DOB 14-Apr-1940, MRN [id number]  PCP:  Einar Pheasant, MD  [facility] Cardiologist:  Kate Sable, MD  [facility] Electrophysiologist:  None   Referring MD: Einar Pheasant, MD   Chief Complaint  Patient presents with  . office visit    Patient c/o intermittent chest pressure and needed to take deep breaths for relief. Patient also reports LE edema after being on her feet during the day. Symptoms resolve after elevation and sleep.    History of Present Illness:    Caitlin Lin is a 80 y.o. female with a hx of hypertension, hyperlipidemia, mild aortic regurgitation,  who presents due to symptoms of chest pain. Patient states having symptoms of left-sided chest pressure over the past 3 to 4 months. Symptoms are not related with exertion. Symptoms prompted her to go to the ED last month, work-up was unrevealing. She also endorses lower extremity swelling, which is worse as the day progresses, usually better when she props of her legs or when she wakes up.  Prior notes Echo 98/3382 normal systolic, impaired relaxation, EF 60 to 65%.  Mild aortic regurgitation Chest CT without contrast 08/2019, coronary artery calcifications.  Past Medical History:  Diagnosis Date  . Cataracts, bilateral   . Chronic kidney disease    Followed by Dr. Holley Raring  . Chronic kidney disease   . Colon polyp   . Constipation due to slow transit   . Diarrhea   . Diarrhea   . GERD (gastroesophageal reflux disease)   . Heart murmur   . History of cyst of breast   . History of hiatal hernia   . Hyperlipidemia   . Hypertension   . Hypothyroidism   . Melanoma of lower leg ([place]) 2013  . Shingles 2013  . Shingles   . Spinal stenosis in cervical region    Dr. Sharlet Salina  . Subdural hematoma ([place])   . Thyroid disease     Past Surgical History:  Procedure Laterality Date  . ABDOMINAL HYSTERECTOMY  1974  . ANTERIOR AND  POSTERIOR VAGINAL REPAIR    . ANTERIOR AND POSTERIOR VAGINAL REPAIR W/ SACROSPINOUS LIGAMENT SUSPENSION Right   . BREAST BIOPSY  1963   Benign per pt's report  . COLONOSCOPY    . ELECTROMAGNETIC NAVIGATION BROCHOSCOPY Right 01/09/2019   Procedure: ELECTROMAGNETIC NAVIGATION BRONCHOSCOPY;  Surgeon: Tyler Pita, MD;  Location: [facility] ORS;  Service: Cardiopulmonary;  Laterality: Right;  . ESOPHAGOGASTRODUODENOSCOPY    . ESOPHAGOGASTRODUODENOSCOPY (EGD) WITH PROPOFOL N/A 11/10/2016   Procedure: ESOPHAGOGASTRODUODENOSCOPY (EGD) WITH PROPOFOL;  Surgeon: Lollie Sails, MD;  Location[facility] ENDOSCOPY;  Service: Endoscopy;  Laterality: N/A;  . ESOPHAGOGASTRODUODENOSCOPY (EGD) WITH PROPOFOL N/A 09/09/2018   Procedure: ESOPHAGOGASTRODUODENOSCOPY (EGD) WITH PROPOFOL;  Surgeon: Lollie Sails, MD;  Location: [facility] ENDOSCOPY;  Service: Endoscopy;  Laterality: N/A;  . ESOPHAGOGASTRODUODENOSCOPY (EGD) WITH PROPOFOL N/A 10/27/2019   Procedure: ESOPHAGOGASTRODUODENOSCOPY (EGD) WITH PROPOFOL;  Surgeon: Robert Bellow, MD;  Location: [facility] ENDOSCOPY;  Service: Endoscopy;  Laterality: N/A;  . LUNG BIOPSY  2007   Benign per pt's report  . LUNG BIOPSY Right   . OOPHORECTOMY  1970  . PERINEOPLASTY  12/17/2013  . sling for stress incontinence  12/18/2011    Current Medications: Current Meds  Medication Sig  . amLODipine (NORVASC) 5 MG tablet Take 5 mg by mouth daily.  Marland Kitchen aspirin 81 MG tablet Take 1 tablet (81 mg total) by mouth  daily.  . atorvastatin (LIPITOR) 20 MG tablet Take 1 tablet by mouth once daily  . Cranberry 400 MG TABS Take 400 mg by mouth daily.  Arna Medici 75 MCG tablet Take 1 tablet by mouth once daily with breakfast  . fexofenadine-pseudoephedrine (ALLEGRA-D 24) 180-240 MG 24 hr tablet Take 1 tablet by mouth daily.  . Glucosamine-Chondroit-Vit C-Mn (GLUCOSAMINE CHONDR 500 COMPLEX PO) Take 2 tablets by mouth daily.  Marland Kitchen losartan (COZAAR) 50 MG tablet Take 1 tablet by mouth once daily   . metoCLOPramide (REGLAN) 5 MG tablet Take 1 tablet (5 mg total) by mouth 2 (two) times daily.  . Multiple Vitamin (MULTIVITAMIN) tablet Take 1 tablet by mouth daily.  . Omega-3 Fatty Acids (FISH OIL) 1000 MG CAPS Take 1,000 mg by mouth daily.  . pantoprazole (PROTONIX) 40 MG tablet One tablet 30 minutes before breakfast and one tablet 30 minutes before your evening meal - prn.  . Probiotic Product (PROBIOTIC DAILY PO) Take by mouth daily.  . sertraline (ZOLOFT) 50 MG tablet Take 1/2 tablet q day x 2 weeks and then one po q day.     Allergies:   Quinapril and Codeine   Social History   Socioeconomic History  . Marital status: Married    Spouse name: Not on file  . Number of children: 2  . Years of education: Not on file  . Highest education level: Not on file  Occupational History  . Occupation: Retired  Tobacco Use  . Smoking status: Never Smoker  . Smokeless tobacco: Never Used  Vaping Use  . Vaping Use: Never used  Substance and Sexual Activity  . Alcohol use: No  . Drug use: No  . Sexual activity: Yes  Other Topics Concern  . Not on file  Social History Narrative   Lives in [place] with husband. Has 2 children boy and girl, 4 grandchildren. Retired Presenter, broadcasting.       Daily Caffeine Use:  NO   Regular Exercise -  Not at this time due to back pain, would like to resume soon   Social Determinants of Health   Financial Resource Strain: Low Risk   . Difficulty of Paying Living Expenses: Not hard at all  Food Insecurity: No Food Insecurity  . Worried About Charity fundraiser in the Last Year: Never true  . Ran Out of Food in the Last Year: Never true  Transportation Needs: No Transportation Needs  . Lack of Transportation (Medical): No  . Lack of Transportation (Non-Medical): No  Physical Activity: Not on file  Stress: No Stress Concern Present  . Feeling of Stress : Not at all  Social Connections: Not on file     Family History: The patient's family history  includes Arthritis in her mother; Breast cancer in her cousin and another family member; Cirrhosis in her mother; Coronary artery disease in her father; Depression in her brother; Diabetes in her maternal aunt; Heart attack in her father; Heart disease in her father; Liver cancer in her cousin and mother; Liver disease in her mother.  ROS:   Please see the history of present illness.     All other systems reviewed and are negative.  EKGs/Labs/Other Studies Reviewed:    The following studies were reviewed today:   EKG:  EKG is  ordered today.  The ekg ordered today demonstrates sinus rhythm  Recent Labs: 11/21/2019: TSH 0.85 03/27/2020: ALT 18; BUN 23; Creatinine, Ser 1.54; Hemoglobin 12.2; Platelets 165.0; Potassium 4.6; Sodium 141  Recent Lipid Panel    Component Value Date/Time   CHOL 154 03/27/2020 0803   TRIG 175.0 (H) 03/27/2020 0803   HDL 37.50 (L) 03/27/2020 0803   CHOLHDL 4 03/27/2020 0803   VLDL 35.0 03/27/2020 0803   LDLCALC 81 03/27/2020 0803   LDLDIRECT 84.0 07/22/2018 0839     Risk Assessment/Calculations:      Physical Exam:    VS:  BP 110/70 (BP Location: Left Arm, Patient Position: Sitting, Cuff Size: Normal)   Pulse 68   Ht 5\' 4"  (1.626 m)   Wt 153 lb (69.4 kg)   SpO2 97%   BMI 26.26 kg/m     Wt Readings from Last 3 Encounters:  04/04/20 153 lb (69.4 kg)  03/29/20 156 lb 9.6 oz (71 kg)  02/19/20 160 lb (72.6 kg)     GEN:  Well nourished, well developed in no acute distress HEENT: Normal NECK: No JVD; No carotid bruits LYMPHATICS: No lymphadenopathy CARDIAC: RRR, no murmurs, rubs, gallops RESPIRATORY:  Clear to auscultation without rales, wheezing or rhonchi  ABDOMEN: Soft, non-tender, non-distended MUSCULOSKELETAL:  No edema; No deformity  SKIN: Warm and dry NEUROLOGIC:  Alert and oriented x 3 PSYCHIATRIC:  Normal affect   ASSESSMENT:    1. Precordial pain   2. Primary hypertension   3. Pure hypercholesterolemia    PLAN:    In order  of problems listed above:  1. Patient with chest pain, risk factors of hypertension, hyperlipidemia, age, coronary artery calcifications on contrast chest CT. Previous echocardiogram with normal systolic and diastolic function. Get Lexiscan Myoview to evaluate presence of ischemia. Avoiding coronary CTA due to renal dysfunction/CKD. Continue aspirin, statin as prescribed from prior coronary calcifications. 2. Hypertension, BP controlled. Continue current meds 3. Hyperlipidemia, continue statin.  Follow-up after Myoview.  Shared Decision Making/Informed Consent The risks [chest pain, shortness of breath, cardiac arrhythmias, dizziness, blood pressure fluctuations, myocardial infarction, stroke/transient ischemic attack, nausea, vomiting, allergic reaction, radiation exposure, metallic taste sensation and life-threatening complications (estimated to be 1 in 10,000)], benefits (risk stratification, diagnosing coronary artery disease, treatment guidance) and alternatives of a nuclear stress test were discussed in detail with Ms. Greeson and she agrees to proceed.   Medication Adjustments/Labs and Tests Ordered: Current medicines are reviewed at length with the patient today.  Concerns regarding medicines are outlined above.  Orders Placed This Encounter  Procedures  . NM Myocar Multi W/Spect W/Wall Motion / EF  . EKG 12-Lead   No orders of the defined types were placed in this encounter.   Patient Instructions  Medication Instructions:  Your physician recommends that you continue on your current medications as directed. Please refer to the Current Medication list given to you today.  *If you need a refill on your cardiac medications before your next appointment, please call your pharmacy*   Lab Work: None Ordered     Testing/Procedures:  1.  [place]       Your caregiver has ordered a Stress Test with nuclear imaging. The purpose of this test is to evaluate the blood supply  to your heart muscle. This procedure is referred to as a "Non-Invasive Stress Test." This is because other than having an IV started in your vein, nothing is inserted or "invades" your body. Cardiac stress tests are done to find areas of poor blood flow to the heart by determining the extent of coronary artery disease (CAD). Some patients exercise on a treadmill, which naturally increases the blood flow to your heart, while  others who are  unable to walk on a treadmill due to physical limitations have a pharmacologic/chemical stress agent called Lexiscan . This medicine will mimic walking on a treadmill by temporarily increasing your coronary blood flow.      PLEASE REPORT TO [facility] MEDICAL MALL ENTRANCE   THE VOLUNTEERS AT THE FIRST DESK WILL DIRECT YOU WHERE TO GO     *Please note: these test may take anywhere between 2-4 hours to complete       Date of Procedure:_____________________________________   Arrival Time for Procedure:______________________________    PLEASE NOTIFY THE OFFICE AT LEAST 24 HOURS IN ADVANCE IF YOU ARE UNABLE TO KEEP YOUR APPOINTMENT.  [place] 24 HOURS IN ADVANCE IF YOU ARE UNABLE TO KEEP YOUR APPOINTMENT. [phone]       How to prepare for your Myoview test:     1. Do not eat or drink after midnight  2. No caffeine for 24 hours prior to test  3. No smoking 24 hours prior to test.  4. Unless instructed otherwise, Take your medication with a small sips of water.    5.         Ladies, please do not wear dresses. Skirts or pants are appropriate. Please wear a short sleeve shirt.  6. No perfume, cologne or lotion.  7. Wear comfortable walking shoes. No heels!       Follow-Up: At [facility], you and your health needs are our priority.  As part of our continuing mission to provide you with exceptional heart care, we have created designated Provider Care Teams.  These Care Teams include your primary  Cardiologist (physician) and Advanced Practice Providers (APPs -  Physician Assistants and Nurse Practitioners) who all work together to provide you with the care you need, when you need it.  We recommend signing up for the patient portal called "MyChart".  Sign up information is provided on this After Visit Summary.  MyChart is used to connect with patients for Virtual Visits (Telemedicine).  Patients are able to view lab/test results, encounter notes, upcoming appointments, etc.  Non-urgent messages can be sent to your provider as well.   To learn more about what you can do with MyChart, go to [email or web address].    Your next appointment:   Follow up after Myoview   The format for your next appointment:   In Person  Provider:   Kate Sable, MD   Other Instructions      Signed, Kate Sable, MD  04/04/2020 1:36 PM    [place]

## 2020-04-08 ENCOUNTER — Ambulatory Visit: Payer: PPO | Admitting: Pulmonary Disease

## 2020-04-08 ENCOUNTER — Encounter: Payer: Self-pay | Admitting: Pulmonary Disease

## 2020-04-08 ENCOUNTER — Other Ambulatory Visit: Payer: Self-pay

## 2020-04-08 VITALS — BP 120/72 | HR 65 | Temp 97.3°F | Ht 64.0 in | Wt 153.0 lb

## 2020-04-08 DIAGNOSIS — R918 Other nonspecific abnormal finding of lung field: Secondary | ICD-10-CM | POA: Diagnosis not present

## 2020-04-08 DIAGNOSIS — K449 Diaphragmatic hernia without obstruction or gangrene: Secondary | ICD-10-CM

## 2020-04-08 DIAGNOSIS — R059 Cough, unspecified: Secondary | ICD-10-CM

## 2020-04-08 DIAGNOSIS — K219 Gastro-esophageal reflux disease without esophagitis: Secondary | ICD-10-CM

## 2020-04-08 NOTE — Progress Notes (Signed)
Subjective:    Patient ID: Caitlin Lin, female    DOB: 1941-02-03, 80 y.o.   MRN: [id number]  HPI Caitlin Lin is a 80 year old life long never smoker, with nodular clusters on the LEFT and RIGHT upper lobes.  She had a negative navigational bronchoscopy on 09 January 2019. She was originally referred to [place] by Dr. Nestor Lewandowsky. Her last visit here was on 21 September 2019 at that time she was doing well from the respiratory standpoint.  At that time also she had had a CT scan of the chest on 28 August 2019 that showed regression to no change in her previously noted nodular densities that are believed to be due to chronic inflammatory/infectious process, suspect MAI.  No shortness of breath she occasionally has to take a "sigh" breath but does not feel breathless with activities.   She has not had any cough, sputum production or hemoptysis. No palpitations. She has had some vague chest discomfort that is being evaluated by cardiology. From the pulmonary standpoint she has no symptoms whatsoever.  Review of Systems A 10 point review of systems was performed and it is as noted above otherwise negative.  Patient Active Problem List   Diagnosis Date Noted  . Fatigue 03/30/2020  . Leg swelling 03/30/2020  . Dysphagia 08/09/2019  . Sleep difficulties 11/23/2018  . Numbness of toes 11/23/2018  . Pelvic pressure in female 08/28/2018  . Hyperglycemia 11/20/2017  . Aortic atherosclerosis ([place]) 06/16/2017  . Low back pain 06/06/2017  . Abdominal pain 06/03/2017  . Fatty liver 02/13/2017  . Aortic regurgitation 07/01/2016  . Health care maintenance 05/12/2016  . Chest pain 05/12/2016  . Angular cheilitis 03/27/2015  . TMJ arthralgia 09/25/2014  . GERD (gastroesophageal reflux disease) 12/05/2013  . Osteopenia 10/30/2013  . Hypercholesterolemia 03/04/2013  . Medicare annual wellness visit, subsequent 09/05/2012  . Anemia, iron deficiency 06/08/2012  . Screening for breast cancer 08/03/2011  .  Rectal prolapse 08/03/2011  . Hypertension 02/09/2011  . CKD (chronic kidney disease) stage 4, GFR 15-29 ml/min (HCC) 02/09/2011  . Hypothyroidism 02/09/2011   Allergies  Allergen Reactions  . Quinapril Other (See Comments)    Hyperkalemia Hyperkalemia Hyperkalemia  . Codeine Rash   Current Meds  Medication Sig  . amLODipine (NORVASC) 5 MG tablet Take 5 mg by mouth daily.  Marland Kitchen aspirin 81 MG tablet Take 1 tablet (81 mg total) by mouth daily.  Marland Kitchen atorvastatin (LIPITOR) 20 MG tablet Take 1 tablet by mouth once daily  . Cranberry 400 MG TABS Take 400 mg by mouth daily.  Arna Medici 75 MCG tablet Take 1 tablet by mouth once daily with breakfast  . fexofenadine-pseudoephedrine (ALLEGRA-D 24) 180-240 MG 24 hr tablet Take 1 tablet by mouth daily.  . Glucosamine-Chondroit-Vit C-Mn (GLUCOSAMINE CHONDR 500 COMPLEX PO) Take 2 tablets by mouth daily.  Marland Kitchen losartan (COZAAR) 50 MG tablet Take 1 tablet by mouth once daily  . metoCLOPramide (REGLAN) 5 MG tablet Take 1 tablet (5 mg total) by mouth 2 (two) times daily.  . Multiple Vitamin (MULTIVITAMIN) tablet Take 1 tablet by mouth daily.  . Omega-3 Fatty Acids (FISH OIL) 1000 MG CAPS Take 1,000 mg by mouth daily.  . pantoprazole (PROTONIX) 40 MG tablet One tablet 30 minutes before breakfast and one tablet 30 minutes before your evening meal - prn.  . Probiotic Product (PROBIOTIC DAILY PO) Take by mouth daily.  . sertraline (ZOLOFT) 50 MG tablet Take 1/2 tablet q day x 2 weeks and then  one po q day.   Immunization History  Administered Date(s) Administered  . Fluad Quad(high Dose 65+) 12/22/2019  . Hepatitis A 07/14/2017, 08/17/2017  . Hepatitis B 07/14/2017, 08/17/2017  . Influenza Split 12/15/2010, 03/07/2012  . Influenza, High Dose Seasonal PF 12/21/2016, 11/17/2017, 12/26/2018  . Influenza,inj,Quad PF,6+ Mos 12/05/2013  . Influenza-Unspecified 12/25/2014, 01/03/2016, 12/28/2016  . PFIZER(Purple Top)SARS-COV-2 Vaccination 05/29/2019, 06/19/2019  .  Pneumococcal Conjugate-13 03/12/2014  . Pneumococcal Polysaccharide-23 12/09/2005, 03/03/2013  . Tdap 03/07/2012  . Zoster 12/09/2009       Objective:   Physical Exam BP 120/72 (BP Location: Left Arm, Cuff Size: Normal)   Pulse 65   Temp (!) 97.3 F (36.3 C) (Temporal)   Ht 5\' 4"  (1.626 m)   Wt 153 lb (69.4 kg)   SpO2 100%   BMI 26.26 kg/m  GENERAL: Well-developed, well-nourished female, age-appropriate, no acute distress, fully ambulatory. No conversational dyspnea. HEAD: Normocephalic, atraumatic.  EYES: Pupils equal, round, reactive to light.  No scleral icterus.  MOUTH: Nose/mouth/throat not examined due to masking requirements for COVID 19.Marland Kitchen NECK: Supple. No thyromegaly. Trachea midline. No JVD.  No adenopathy. PULMONARY: Good air entry bilaterally.  No adventitious sounds. CARDIOVASCULAR: S1 and S2. Regular rate and rhythm. No rubs, murmurs or gallops heard. ABDOMEN: Benign. MUSCULOSKELETAL: No joint deformity, no clubbing, no edema.  NEUROLOGIC: No focal deficits, no gait disturbance, fluent speech. SKIN: Intact,warm,dry. On limited exam, no rashes. PSYCH: Mood and behavior normal.  Chest x-ray obtained 02/19/2020:  Independently reviewed, improving small nodules in the chest. There is thoracic scoliosis.     Assessment & Plan:     ICD-10-CM   1. Lung nodules  R91.8    These are stable to improved Likely related to inflammatory/infectious disease Patient asymptomatic  2. Gastroesophageal reflux disease with hiatal hernia  K21.9    K44.9    Continue management with antireflux measures Continue PPI  3. Cough  R05.9    This issue is entirely resolved   Discussion:  She is doing well from the pulmonary standpoint. Her prior lung nodules appeared unchanged to improved. These are likely related to chronic inflammatory/infectious process. We will see her in follow-up in 6 months time she is to contact us prior to that time should any new difficulties arise.  Renold Don, MD [place] [facility]   *This note was dictated using voice recognition software/Dragon.  Despite best efforts to proofread, errors can occur which can change the meaning.  Any change was purely unintentional.

## 2020-04-08 NOTE — Patient Instructions (Signed)
I will follow-up on the tests that Dr. Garen Lah is ordering  We will see you in follow-up in 6 months time call sooner should any new problems arise

## 2020-04-10 DIAGNOSIS — Z961 Presence of intraocular lens: Secondary | ICD-10-CM | POA: Diagnosis not present

## 2020-04-10 DIAGNOSIS — H353131 Nonexudative age-related macular degeneration, bilateral, early dry stage: Secondary | ICD-10-CM | POA: Diagnosis not present

## 2020-04-12 ENCOUNTER — Other Ambulatory Visit: Payer: Self-pay

## 2020-04-12 ENCOUNTER — Ambulatory Visit
Admission: RE | Admit: 2020-04-12 | Discharge: 2020-04-12 | Disposition: A | Discharge status: Home / Self Care | Payer: PPO | Source: Ambulatory Visit | Attending: Cardiology | Admitting: Cardiology

## 2020-04-12 DIAGNOSIS — R072 Precordial pain: Secondary | ICD-10-CM | POA: Insufficient documentation

## 2020-04-12 LAB — NM MYOCAR MULTI W/SPECT W/WALL MOTION / EF
Estimated workload: 1 METS
Exercise duration (min): 0 min
Exercise duration (sec): 0 s
LV dias vol: 33 mL (ref 46–106)
LV sys vol: 10 mL
MPHR: 142 {beats}/min
Peak HR: 97 {beats}/min
Percent HR: 68 %
Rest HR: 67 {beats}/min
SDS: 0
SRS: 12
SSS: 10
TID: 0.95

## 2020-04-12 MED ORDER — TECHNETIUM TC 99M TETROFOSMIN IV KIT
30.0000 | PACK | Freq: Once | INTRAVENOUS | Status: AC | PRN
  Administered 2020-04-12: 32.91 via INTRAVENOUS

## 2020-04-12 MED ORDER — TECHNETIUM TC 99M TETROFOSMIN IV KIT
10.1400 | PACK | Freq: Once | INTRAVENOUS | Status: AC | PRN
  Administered 2020-04-12: 10.14 via INTRAVENOUS

## 2020-04-12 MED ORDER — REGADENOSON 0.4 MG/5ML IV SOLN
0.4000 mg | Freq: Once | INTRAVENOUS | Status: AC
  Administered 2020-04-12: 0.4 mg via INTRAVENOUS
  Filled 2020-04-12: qty 5

## 2020-04-16 ENCOUNTER — Telehealth: Payer: Self-pay | Admitting: Cardiology

## 2020-04-16 NOTE — Telephone Encounter (Signed)
Patient calling in to see if upcoming appt on 04/19/20 is needed after she received good test results. Please advise

## 2020-04-18 DIAGNOSIS — K219 Gastro-esophageal reflux disease without esophagitis: Secondary | ICD-10-CM | POA: Diagnosis not present

## 2020-04-18 DIAGNOSIS — K449 Diaphragmatic hernia without obstruction or gangrene: Secondary | ICD-10-CM | POA: Diagnosis not present

## 2020-04-18 DIAGNOSIS — R197 Diarrhea, unspecified: Secondary | ICD-10-CM | POA: Diagnosis not present

## 2020-04-18 NOTE — Telephone Encounter (Signed)
Attempted to call, call dropped with no vm

## 2020-04-19 ENCOUNTER — Ambulatory Visit: Payer: PPO | Admitting: Cardiology

## 2020-04-19 NOTE — Telephone Encounter (Signed)
Spoke with patient and cancelled today's appointment accordingly.

## 2020-05-01 ENCOUNTER — Other Ambulatory Visit: Payer: Self-pay | Admitting: Internal Medicine

## 2020-05-10 ENCOUNTER — Ambulatory Visit: Payer: PPO | Admitting: Internal Medicine

## 2020-05-16 ENCOUNTER — Ambulatory Visit (INDEPENDENT_AMBULATORY_CARE_PROVIDER_SITE_OTHER): Payer: PPO | Admitting: Internal Medicine

## 2020-05-16 ENCOUNTER — Encounter: Payer: Self-pay | Admitting: Internal Medicine

## 2020-05-16 ENCOUNTER — Other Ambulatory Visit: Payer: Self-pay

## 2020-05-16 DIAGNOSIS — I700 Atherosclerosis of aorta: Secondary | ICD-10-CM

## 2020-05-16 DIAGNOSIS — E039 Hypothyroidism, unspecified: Secondary | ICD-10-CM

## 2020-05-16 DIAGNOSIS — I10 Essential (primary) hypertension: Secondary | ICD-10-CM

## 2020-05-16 DIAGNOSIS — N184 Chronic kidney disease, stage 4 (severe): Secondary | ICD-10-CM | POA: Diagnosis not present

## 2020-05-16 DIAGNOSIS — K760 Fatty (change of) liver, not elsewhere classified: Secondary | ICD-10-CM | POA: Diagnosis not present

## 2020-05-16 DIAGNOSIS — N1832 Chronic kidney disease, stage 3b: Secondary | ICD-10-CM | POA: Diagnosis not present

## 2020-05-16 DIAGNOSIS — E7800 Pure hypercholesterolemia, unspecified: Secondary | ICD-10-CM | POA: Diagnosis not present

## 2020-05-16 DIAGNOSIS — R739 Hyperglycemia, unspecified: Secondary | ICD-10-CM | POA: Diagnosis not present

## 2020-05-16 DIAGNOSIS — K219 Gastro-esophageal reflux disease without esophagitis: Secondary | ICD-10-CM | POA: Diagnosis not present

## 2020-05-16 DIAGNOSIS — F439 Reaction to severe stress, unspecified: Secondary | ICD-10-CM | POA: Diagnosis not present

## 2020-05-16 DIAGNOSIS — E875 Hyperkalemia: Secondary | ICD-10-CM | POA: Diagnosis not present

## 2020-05-16 NOTE — Progress Notes (Signed)
Patient ID: Caitlin Lin, female   DOB: Jan 12, 1941, 80 y.o.   MRN: [id number]   Subjective:    Patient ID: Caitlin Lin, female    DOB: [birth year], 80 y.o.   MRN: [id number]  HPI This visit occurred during the SARS-CoV-2 public health emergency.  Safety protocols were in place, including screening questions prior to the visit, additional usage of staff PPE, and extensive cleaning of exam room while observing appropriate contact time as indicated for disinfecting solutions.  Patient here for a scheduled follow up. Here to follow up regarding her blood pressure and cholesterol.  Also recently evaluated for chest pain.  Cardiac w/up - stress test - normal.  Saw GI 04/18/20.  Recommended continuing protonix.  carafate added.  reglan decreased to 43m daily.  Taking metamucil for her bowels.  She reports her bowels are doing better.  Still having pressure when she eats.  No regurgitation of food.  Has f/u planned with GI.  Saw pulmonary 04/08/20.  Stable.  Breathing stable.  No increased cough or congestion.  Handling stress.  Feels zoloft is working.    Past Medical History:  Diagnosis Date  . Cataracts, bilateral   . Chronic kidney disease    Followed by Dr. LHolley Raring . Chronic kidney disease   . Colon polyp   . Constipation due to slow transit   . Diarrhea   . Diarrhea   . GERD (gastroesophageal reflux disease)   . Heart murmur   . History of cyst of breast   . History of hiatal hernia   . Hyperlipidemia   . Hypertension   . Hypothyroidism   . [place]  . Shingles 2013  . Shingles   . Spinal stenosis in cervical region    Dr. CSharlet Salina . [place]   . Thyroid disease    Past Surgical History:  Procedure Laterality Date  . ABDOMINAL HYSTERECTOMY  1974  . ANTERIOR AND POSTERIOR VAGINAL REPAIR    . ANTERIOR AND POSTERIOR VAGINAL REPAIR W/ SACROSPINOUS LIGAMENT SUSPENSION Right   . BREAST BIOPSY  1963   Benign per pt's report  . COLONOSCOPY     . ELECTROMAGNETIC NAVIGATION BROCHOSCOPY Right 01/09/2019   Procedure: ELECTROMAGNETIC NAVIGATION BRONCHOSCOPY;  Surgeon: GTyler Pita MD;  Location: [facility] ORS;  Service: Cardiopulmonary;  Laterality: Right;  . ESOPHAGOGASTRODUODENOSCOPY    . ESOPHAGOGASTRODUODENOSCOPY (EGD) WITH PROPOFOL N/A 11/10/2016   Procedure: ESOPHAGOGASTRODUODENOSCOPY (EGD) WITH PROPOFOL;  Surgeon: SLollie Sails MD;  [facility];  Service: Endoscopy;  Laterality: N/A;  . ESOPHAGOGASTRODUODENOSCOPY (EGD) WITH PROPOFOL N/A 09/09/2018   Procedure: ESOPHAGOGASTRODUODENOSCOPY (EGD) WITH PROPOFOL;  Surgeon: SLollie Sails MD;  [facility];  Service: Endoscopy;  Laterality: N/A;  . ESOPHAGOGASTRODUODENOSCOPY (EGD) WITH PROPOFOL N/A 10/27/2019   Procedure: ESOPHAGOGASTRODUODENOSCOPY (EGD) WITH PROPOFOL;  Surgeon: BRobert Bellow MD;  Location: [facility] ENDOSCOPY;  Service: Endoscopy;  Laterality: N/A;  . LUNG BIOPSY  2007   Benign per pt's report  . LUNG BIOPSY Right   . OOPHORECTOMY  1970  . PERINEOPLASTY  12/17/2013  . sling for stress incontinence  12/18/2011   Family History  Problem Relation Age of Onset  . Arthritis Mother   . Liver disease Mother   . Cirrhosis Mother   . Liver cancer Mother   . Heart disease Father   . Heart attack Father   . Coronary artery disease Father   . Depression Brother   . Breast cancer Other   .  Breast cancer Cousin        maternal cousins  . Liver cancer Cousin   . Diabetes Maternal Aunt    Social History   Socioeconomic History  . Marital status: Married    Spouse name: Not on file  . Number of children: 2  . Years of education: Not on file  . Highest education level: Not on file  Occupational History  . Occupation: Retired  Tobacco Use  . Smoking status: Never Smoker  . Smokeless tobacco: Never Used  Vaping Use  . Vaping Use: Never used  Substance and Sexual Activity  . Alcohol use: No  . Drug use: No  . Sexual activity:  Yes  Other Topics Concern  . Not on file  Social History Narrative   Lives in [place] with husband. Has 2 children boy and girl, 4 grandchildren. Retired Presenter, broadcasting.       Daily Caffeine Use:  NO   Regular Exercise -  Not at this time due to back pain, would like to resume soon   Social Determinants of Health   Financial Resource Strain: Low Risk   . Difficulty of Paying Living Expenses: Not hard at all  Food Insecurity: No Food Insecurity  . Worried About Charity fundraiser in the Last Year: Never true  . Ran Out of Food in the Last Year: Never true  Transportation Needs: No Transportation Needs  . Lack of Transportation (Medical): No  . Lack of Transportation (Non-Medical): No  Physical Activity: Not on file  Stress: No Stress Concern Present  . Feeling of Stress : Not at all  Social Connections: Not on file    Outpatient Encounter Medications as of 05/16/2020  Medication Sig  . amLODipine (NORVASC) 5 MG tablet Take 5 mg by mouth daily.  Marland Kitchen aspirin 81 MG tablet Take 1 tablet (81 mg total) by mouth daily.  Marland Kitchen atorvastatin (LIPITOR) 20 MG tablet Take 1 tablet by mouth once daily  . Cranberry 400 MG TABS Take 400 mg by mouth daily.  Arna Medici 75 MCG tablet Take 1 tablet by mouth once daily with breakfast  . fexofenadine-pseudoephedrine (ALLEGRA-D 24) 180-240 MG 24 hr tablet Take 1 tablet by mouth daily.  . Glucosamine-Chondroit-Vit C-Mn (GLUCOSAMINE CHONDR 500 COMPLEX PO) Take 2 tablets by mouth daily.  Marland Kitchen losartan (COZAAR) 50 MG tablet Take 1 tablet by mouth once daily  . metoCLOPramide (REGLAN) 5 MG tablet Take 1 tablet (5 mg total) by mouth 2 (two) times daily.  . Multiple Vitamin (MULTIVITAMIN) tablet Take 1 tablet by mouth daily.  . Omega-3 Fatty Acids (FISH OIL) 1000 MG CAPS Take 1,000 mg by mouth daily.  . pantoprazole (PROTONIX) 40 MG tablet One tablet 30 minutes before breakfast and one tablet 30 minutes before your evening meal - prn.  . Probiotic Product (PROBIOTIC  DAILY PO) Take by mouth daily.  . sertraline (ZOLOFT) 50 MG tablet Take 1/2 tablet q day x 2 weeks and then one po q day.   No facility-administered encounter medications on file as of 05/16/2020.    Review of Systems  Constitutional: Negative for appetite change and unexpected weight change.  HENT: Negative for congestion and sinus pressure.   Respiratory: Negative for cough, chest tightness and shortness of breath.   Cardiovascular: Negative for chest pain, palpitations and leg swelling.  Gastrointestinal: Negative for diarrhea, nausea and vomiting.       Describes pressure when eating.    Genitourinary: Negative for difficulty urinating and dysuria.  Musculoskeletal: Negative for joint swelling and myalgias.  Skin: Negative for color change and rash.  Neurological: Negative for dizziness, light-headedness and headaches.  Psychiatric/Behavioral: Negative for agitation and dysphoric mood.       Objective:    Physical Exam Vitals reviewed.  Constitutional:      General: She is not in acute distress.    Appearance: Normal appearance.  HENT:     Head: Normocephalic and atraumatic.     Right Ear: External ear normal.     Left Ear: External ear normal.     Mouth/Throat:     Mouth: Oropharynx is clear and moist.  Eyes:     General: No scleral icterus.       Right eye: No discharge.        Left eye: No discharge.     Conjunctiva/sclera: Conjunctivae normal.  Neck:     Thyroid: No thyromegaly.  Cardiovascular:     Rate and Rhythm: Normal rate and regular rhythm.  Pulmonary:     Effort: No respiratory distress.     Breath sounds: Normal breath sounds. No wheezing.  Abdominal:     General: Bowel sounds are normal.     Palpations: Abdomen is soft.     Tenderness: There is no abdominal tenderness.  Musculoskeletal:        General: No swelling, tenderness or edema.     Cervical back: Neck supple. No tenderness.  Lymphadenopathy:     Cervical: No cervical adenopathy.  Skin:     Findings: No erythema or rash.  Neurological:     Mental Status: She is alert.  Psychiatric:        Mood and Affect: Mood normal.        Behavior: Behavior normal.     BP 136/82   Pulse 71   Temp 97.7 F (36.5 C)   Ht _0  (1.626 m)   Wt 151 lb 8 oz (68.7 kg)   SpO2 98%   BMI 26.00 kg/m  Wt Readings from Last 3 Encounters:  05/16/20 151 lb 8 oz (68.7 kg)  04/08/20 153 lb (69.4 kg)  04/04/20 153 lb (69.4 kg)     Lab Results  Component Value Date   WBC 4.4 03/27/2020   HGB 12.2 03/27/2020   HCT 36.4 03/27/2020   PLT 165.0 03/27/2020   GLUCOSE 96 03/27/2020   CHOL 154 03/27/2020   TRIG 175.0 (H) 03/27/2020   HDL 37.50 (L) 03/27/2020   LDLDIRECT 84.0 07/22/2018   LDLCALC 81 03/27/2020   ALT 18 03/27/2020   AST 24 03/27/2020   NA 141 03/27/2020   K 4.6 03/27/2020   CL 109 03/27/2020   CREATININE 1.54 (H) 03/27/2020   BUN 23 03/27/2020   CO2 24 03/27/2020   TSH 0.85 11/21/2019   HGBA1C 5.5 03/27/2020   MICROALBUR 2.1 (H) 03/27/2015    NM Myocar Multi W/Spect W/Wall Motion / EF  Result Date: 04/12/2020 Pharmacological myocardial perfusion imaging study with no significant  Ischemia Small region fixed defect apical, apical lateral wall, possible attenuation artifact Normal wall motion, EF estimated at 92% No EKG changes concerning for ischemia at peak stress or in recovery.  Rare PVC noted CT attenuation correction images with mild diffuse aortic atherosclerosis Low risk scan Signed, Esmond Plants, MD, Ph.D [facility]       Assessment & Plan:   Problem List Items Addressed This Visit    Aortic atherosclerosis ([place])    Continue lipitor.  CKD (chronic kidney disease) stage 4, GFR 15-29 ml/min (HCC)    Continue f/u with nephrology.  Avoid antiinflammatories.       Fatty liver    Diet and exercise.  Follow liver panel.       GERD (gastroesophageal reflux disease)    Continue protonix.        Hypercholesterolemia    Continue lipitor.  Low  cholesterol diet and exercise.  Follow lipid panel and liver function tests.        Relevant Orders   Hepatic function panel   Lipid panel   Hyperglycemia    Low carb diet and exercise.  Follow met b and a1c.       Relevant Orders   Hemoglobin A1c   Hypertension (Chronic)    Blood pressure as outlined.  Continue amlodipine and losartan.  Follow pressures.  Follow metabolic panel.       Relevant Orders   CBC with Differential/Platelet   Basic metabolic panel   Hypothyroidism (Chronic)    On thyroid replacement.  Follow tsh.       Stress    Was started on zoloft previous visit.  Doing better.  Feels better.  Continue zoloft at current dose.  Follow.            Einar Pheasant, MD

## 2020-05-18 ENCOUNTER — Encounter: Payer: Self-pay | Admitting: Internal Medicine

## 2020-05-18 DIAGNOSIS — F439 Reaction to severe stress, unspecified: Secondary | ICD-10-CM | POA: Insufficient documentation

## 2020-05-18 NOTE — Assessment & Plan Note (Signed)
Diet and exercise.  Follow liver panel.

## 2020-05-18 NOTE — Assessment & Plan Note (Signed)
Continue lipitor  ?

## 2020-05-18 NOTE — Assessment & Plan Note (Signed)
On thyroid replacement.  Follow tsh.  

## 2020-05-18 NOTE — Assessment & Plan Note (Signed)
Continue lipitor.  Low cholesterol diet and exercise.  Follow lipid panel and liver function tests.

## 2020-05-18 NOTE — Assessment & Plan Note (Signed)
Continue protonix  

## 2020-05-18 NOTE — Assessment & Plan Note (Signed)
Continue f/u with nephrology.  Avoid antiinflammatories.

## 2020-05-18 NOTE — Assessment & Plan Note (Signed)
Low carb diet and exercise.  Follow met b and a1c.  

## 2020-05-18 NOTE — Assessment & Plan Note (Signed)
Blood pressure as outlined.  Continue amlodipine and losartan.  Follow pressures.  Follow metabolic panel.

## 2020-05-18 NOTE — Assessment & Plan Note (Signed)
Was started on zoloft previous visit.  Doing better.  Feels better.  Continue zoloft at current dose.  Follow.

## 2020-05-30 DIAGNOSIS — R1013 Epigastric pain: Secondary | ICD-10-CM | POA: Diagnosis not present

## 2020-05-30 DIAGNOSIS — R634 Abnormal weight loss: Secondary | ICD-10-CM | POA: Diagnosis not present

## 2020-05-31 ENCOUNTER — Other Ambulatory Visit: Payer: Self-pay | Admitting: Internal Medicine

## 2020-06-10 ENCOUNTER — Other Ambulatory Visit: Payer: Self-pay | Admitting: Internal Medicine

## 2020-06-11 ENCOUNTER — Other Ambulatory Visit: Payer: Self-pay | Admitting: Gastroenterology

## 2020-06-11 DIAGNOSIS — R634 Abnormal weight loss: Secondary | ICD-10-CM

## 2020-06-11 DIAGNOSIS — R1013 Epigastric pain: Secondary | ICD-10-CM

## 2020-06-11 DIAGNOSIS — R0602 Shortness of breath: Secondary | ICD-10-CM

## 2020-06-17 ENCOUNTER — Other Ambulatory Visit: Payer: Self-pay | Admitting: Internal Medicine

## 2020-06-26 ENCOUNTER — Other Ambulatory Visit: Payer: PPO

## 2020-06-27 DIAGNOSIS — R197 Diarrhea, unspecified: Secondary | ICD-10-CM | POA: Diagnosis not present

## 2020-06-27 DIAGNOSIS — R634 Abnormal weight loss: Secondary | ICD-10-CM | POA: Diagnosis not present

## 2020-06-28 ENCOUNTER — Ambulatory Visit
Admission: RE | Admit: 2020-06-28 | Discharge: 2020-06-28 | Disposition: A | Discharge status: Home / Self Care | Payer: PPO | Source: Ambulatory Visit | Attending: Gastroenterology | Admitting: Gastroenterology

## 2020-06-28 ENCOUNTER — Other Ambulatory Visit: Payer: Self-pay

## 2020-06-28 DIAGNOSIS — R634 Abnormal weight loss: Secondary | ICD-10-CM

## 2020-06-28 DIAGNOSIS — R1013 Epigastric pain: Secondary | ICD-10-CM | POA: Diagnosis not present

## 2020-06-28 DIAGNOSIS — R0602 Shortness of breath: Secondary | ICD-10-CM | POA: Diagnosis not present

## 2020-06-28 DIAGNOSIS — N8110 Cystocele, unspecified: Secondary | ICD-10-CM | POA: Diagnosis not present

## 2020-06-28 DIAGNOSIS — K449 Diaphragmatic hernia without obstruction or gangrene: Secondary | ICD-10-CM | POA: Diagnosis not present

## 2020-06-28 LAB — POCT I-STAT CREATININE: Creatinine, Ser: 1.6 mg/dL — ABNORMAL HIGH (ref 0.44–1.00)

## 2020-06-28 MED ORDER — IOHEXOL 300 MG/ML  SOLN
75.0000 mL | Freq: Once | INTRAMUSCULAR | Status: AC | PRN
  Administered 2020-06-28: 75 mL via INTRAVENOUS

## 2020-07-17 ENCOUNTER — Other Ambulatory Visit: Payer: Self-pay | Admitting: Internal Medicine

## 2020-07-18 DIAGNOSIS — R1013 Epigastric pain: Secondary | ICD-10-CM | POA: Diagnosis not present

## 2020-07-18 DIAGNOSIS — Z87898 Personal history of other specified conditions: Secondary | ICD-10-CM | POA: Diagnosis not present

## 2020-08-01 ENCOUNTER — Other Ambulatory Visit: Payer: Self-pay | Admitting: Internal Medicine

## 2020-08-08 ENCOUNTER — Encounter: Payer: Self-pay | Admitting: *Deleted

## 2020-08-09 ENCOUNTER — Ambulatory Visit
Admission: RE | Admit: 2020-08-09 | Discharge: 2020-08-09 | Disposition: A | Discharge status: Home / Self Care | Payer: PPO | Attending: Gastroenterology | Admitting: Gastroenterology

## 2020-08-09 ENCOUNTER — Other Ambulatory Visit: Payer: Self-pay

## 2020-08-09 ENCOUNTER — Ambulatory Visit: Payer: PPO | Admitting: Registered Nurse

## 2020-08-09 ENCOUNTER — Encounter
Admission: RE | Disposition: A | Discharge status: Home / Self Care | Payer: Self-pay | Source: Home / Self Care | Attending: Gastroenterology

## 2020-08-09 DIAGNOSIS — K317 Polyp of stomach and duodenum: Secondary | ICD-10-CM | POA: Insufficient documentation

## 2020-08-09 DIAGNOSIS — K219 Gastro-esophageal reflux disease without esophagitis: Secondary | ICD-10-CM | POA: Diagnosis not present

## 2020-08-09 DIAGNOSIS — R1013 Epigastric pain: Secondary | ICD-10-CM | POA: Insufficient documentation

## 2020-08-09 DIAGNOSIS — K449 Diaphragmatic hernia without obstruction or gangrene: Secondary | ICD-10-CM | POA: Insufficient documentation

## 2020-08-09 DIAGNOSIS — N189 Chronic kidney disease, unspecified: Secondary | ICD-10-CM | POA: Insufficient documentation

## 2020-08-09 DIAGNOSIS — Z885 Allergy status to narcotic agent status: Secondary | ICD-10-CM | POA: Insufficient documentation

## 2020-08-09 DIAGNOSIS — Z7982 Long term (current) use of aspirin: Secondary | ICD-10-CM | POA: Diagnosis not present

## 2020-08-09 DIAGNOSIS — Z79899 Other long term (current) drug therapy: Secondary | ICD-10-CM | POA: Diagnosis not present

## 2020-08-09 DIAGNOSIS — E039 Hypothyroidism, unspecified: Secondary | ICD-10-CM | POA: Insufficient documentation

## 2020-08-09 DIAGNOSIS — Z888 Allergy status to other drugs, medicaments and biological substances status: Secondary | ICD-10-CM | POA: Insufficient documentation

## 2020-08-09 DIAGNOSIS — I129 Hypertensive chronic kidney disease with stage 1 through stage 4 chronic kidney disease, or unspecified chronic kidney disease: Secondary | ICD-10-CM | POA: Insufficient documentation

## 2020-08-09 DIAGNOSIS — K2970 Gastritis, unspecified, without bleeding: Secondary | ICD-10-CM | POA: Insufficient documentation

## 2020-08-09 DIAGNOSIS — K3189 Other diseases of stomach and duodenum: Secondary | ICD-10-CM | POA: Diagnosis not present

## 2020-08-09 DIAGNOSIS — K2950 Unspecified chronic gastritis without bleeding: Secondary | ICD-10-CM | POA: Diagnosis not present

## 2020-08-09 DIAGNOSIS — D131 Benign neoplasm of stomach: Secondary | ICD-10-CM | POA: Diagnosis not present

## 2020-08-09 HISTORY — PX: ESOPHAGOGASTRODUODENOSCOPY (EGD) WITH PROPOFOL: SHX5813

## 2020-08-09 SURGERY — ESOPHAGOGASTRODUODENOSCOPY (EGD) WITH PROPOFOL
Anesthesia: General

## 2020-08-09 MED ORDER — PROPOFOL 10 MG/ML IV BOLUS
INTRAVENOUS | Status: DC | PRN
  Administered 2020-08-09: 60 mg via INTRAVENOUS

## 2020-08-09 MED ORDER — SODIUM CHLORIDE 0.9 % IV SOLN: INTRAVENOUS | Status: DC

## 2020-08-09 MED ORDER — LIDOCAINE HCL (CARDIAC) PF 100 MG/5ML IV SOSY
PREFILLED_SYRINGE | INTRAVENOUS | Status: DC | PRN
  Administered 2020-08-09: 40 mg via INTRAVENOUS

## 2020-08-09 MED ORDER — PROPOFOL 500 MG/50ML IV EMUL
INTRAVENOUS | Status: DC | PRN
  Administered 2020-08-09: 150 ug/kg/min via INTRAVENOUS

## 2020-08-09 MED ORDER — PROPOFOL 500 MG/50ML IV EMUL
INTRAVENOUS | Status: AC
  Filled 2020-08-09: qty 50

## 2020-08-09 NOTE — H&P (Signed)
Outpatient short stay form Pre-procedure 08/09/2020 7:58 AM Raylene Miyamoto MD, MPH  Primary Physician: Dr. Nicki Reaper  Reason for visit:  Dyspepsia  History of present illness:   80 y/o lady with history of hypothyroidism, hypertension, and hiatal hernia here with recurrent dyspepsia symptoms. Some improvement with SSRI. No family history of GI malignancies. No blood thinners. No GI surgeries.    Current Facility-Administered Medications:  .  0.9 %  sodium chloride infusion, , Intravenous, Continuous, Eljay Lave, Hilton Cork, MD, Last Rate: 20 mL/hr at 08/09/20 0751, New Bag at 08/09/20 0751  Medications Prior to Admission  Medication Sig Dispense Refill Last Dose  . amLODipine (NORVASC) 5 MG tablet Take 5 mg by mouth daily.   08/09/2020 at Unknown time  . aspirin 81 MG tablet Take 1 tablet (81 mg total) by mouth daily. 30 tablet 3 08/08/2020 at Unknown time  . atorvastatin (LIPITOR) 20 MG tablet Take 1 tablet by mouth once daily 90 tablet 0 08/08/2020 at Unknown time  . EUTHYROX 75 MCG tablet Take 1 tablet by mouth once daily with breakfast 90 tablet 0 08/09/2020 at Unknown time  . Glucosamine-Chondroit-Vit C-Mn (GLUCOSAMINE CHONDR 500 COMPLEX PO) Take 2 tablets by mouth daily.   08/08/2020 at Unknown time  . losartan (COZAAR) 50 MG tablet Take 1 tablet by mouth once daily 90 tablet 0 08/09/2020 at Unknown time  . metoCLOPramide (REGLAN) 5 MG tablet Take 1 tablet (5 mg total) by mouth 2 (two) times daily. 30 tablet 0 08/08/2020 at Unknown time  . Multiple Vitamin (MULTIVITAMIN) tablet Take 1 tablet by mouth daily.   08/08/2020 at Unknown time  . Omega-3 Fatty Acids (FISH OIL) 1000 MG CAPS Take 1,000 mg by mouth daily.   08/08/2020 at Unknown time  . pantoprazole (PROTONIX) 40 MG tablet One tablet 30 minutes before breakfast and one tablet 30 minutes before your evening meal - prn. 180 tablet 1 08/08/2020 at Unknown time  . Probiotic Product (PROBIOTIC DAILY PO) Take by mouth daily.   08/08/2020 at Unknown  time  . sertraline (ZOLOFT) 50 MG tablet TAKE 1/2 (ONE-HALF) TABLET BY MOUTH ONCE DAILY FOR 14 DAYS THEN  INCREASE  TO  TAKE  ONE  TABLET  DAILY  THEREAFTER 30 tablet 0 08/08/2020 at Unknown time  . Cranberry 400 MG TABS Take 400 mg by mouth daily.     . fexofenadine-pseudoephedrine (ALLEGRA-D 24) 180-240 MG 24 hr tablet Take 1 tablet by mouth daily.        Allergies  Allergen Reactions  . Quinapril Other (See Comments)    Hyperkalemia Hyperkalemia Hyperkalemia  . Codeine Rash     Past Medical History:  Diagnosis Date  . Cataracts, bilateral   . Chronic kidney disease    Followed by Dr. Holley Raring  . Chronic kidney disease   . Colon polyp   . Constipation due to slow transit   . Diarrhea   . Diarrhea   . GERD (gastroesophageal reflux disease)   . Heart murmur   . History of cyst of breast   . History of hiatal hernia   . Hyperlipidemia   . Hypertension   . Hypothyroidism   . Melanoma of lower leg ([place]) 2013  . Shingles 2013  . Shingles   . Spinal stenosis in cervical region    Dr. Sharlet Salina  . Subdural hematoma ([place])   . Thyroid disease     Review of systems:  Otherwise negative.    Physical Exam  Gen: Alert, oriented. Appears stated age.  HEENT:PERRLA. Lungs: No respiratory distress CV: RRR Abd: soft, benign, no masses Ext: No edema    Planned procedures: Proceed with EGD. The patient understands the nature of the planned procedure, indications, risks, alternatives and potential complications including but not limited to bleeding, infection, perforation, damage to internal organs and possible oversedation/side effects from anesthesia. The patient agrees and gives consent to proceed.  Please refer to procedure notes for findings, recommendations and patient disposition/instructions.     Raylene Miyamoto MD, MPH Gastroenterology 08/09/2020  7:58 AM

## 2020-08-09 NOTE — Anesthesia Postprocedure Evaluation (Signed)
Anesthesia Post Note  Patient: Caitlin Lin  Procedure(s) Performed: ESOPHAGOGASTRODUODENOSCOPY (EGD) WITH PROPOFOL (N/A )  Patient location during evaluation: Phase II Anesthesia Type: General Level of consciousness: awake and alert, awake and oriented Pain management: pain level controlled Vital Signs Assessment: post-procedure vital signs reviewed and stable Respiratory status: spontaneous breathing, nonlabored ventilation and respiratory function stable Cardiovascular status: blood pressure returned to baseline and stable Postop Assessment: no apparent nausea or vomiting Anesthetic complications: no   No complications documented.   Last Vitals:  Vitals:   08/09/20 0830 08/09/20 0833  BP:  133/65  Pulse: 60 62  Resp: 11 19  Temp:    SpO2: 100% 99%    Last Pain:  Vitals:   08/09/20 0810  TempSrc: Temporal  PainSc:                  Phill Mutter

## 2020-08-09 NOTE — Transfer of Care (Signed)
Immediate Anesthesia Transfer of Care Note  Patient: Caitlin Lin  Procedure(s) Performed: ESOPHAGOGASTRODUODENOSCOPY (EGD) WITH PROPOFOL (N/A )  Patient Location: PACU  Anesthesia Type:MAC  Level of Consciousness: drowsy and patient cooperative  Airway & Oxygen Therapy: Patient Spontanous Breathing  Post-op Assessment: Report given to RN and Post -op Vital signs reviewed and stable  Post vital signs: Reviewed and stable  Last Vitals:  Vitals Value Taken Time  BP 106/56 08/09/20 0825  Temp 35.6 C 08/09/20 0810  Pulse 60 08/09/20 0826  Resp 18 08/09/20 0826  SpO2 99 % 08/09/20 0826  Vitals shown include unvalidated device data.  Last Pain:  Vitals:   08/09/20 0810  TempSrc: Temporal  PainSc:          Complications: No complications documented.

## 2020-08-09 NOTE — Op Note (Signed)
[facility] Gastroenterology Patient Name: Caitlin Lin Procedure Date: 08/09/2020 8:00 AM MRN: [id number] Account #: [id number] Date of Birth: Jul 19, 1940 Admit Type: Outpatient Age: 80 Room: [facility] ENDO ROOM 3 Gender: Female Note Status: Finalized Procedure:             Upper GI endoscopy Indications:           Dyspepsia Providers:             Andrey Farmer MD, MD Medicines:             Monitored Anesthesia Care Complications:         No immediate complications. Estimated blood loss:                         Minimal. Procedure:             Pre-Anesthesia Assessment:                        - Prior to the procedure, a History and Physical was                         performed, and patient medications and allergies were                         reviewed. The patient is competent. The risks and                         benefits of the procedure and the sedation options and                         risks were discussed with the patient. All questions                         were answered and informed consent was obtained.                         Patient identification and proposed procedure were                         verified by the physician, the nurse, the anesthetist                         and the technician in the endoscopy suite. Mental                         Status Examination: alert and oriented. Airway                         Examination: normal oropharyngeal airway and neck                         mobility. Respiratory Examination: clear to                         auscultation. CV Examination: normal. Prophylactic                         Antibiotics: The patient does not require prophylactic  antibiotics. Prior Anticoagulants: The patient has                         taken no previous anticoagulant or antiplatelet                         agents. ASA Grade Assessment: II - A patient with mild                         systemic disease.  After reviewing the risks and                         benefits, the patient was deemed in satisfactory                         condition to undergo the procedure. The anesthesia                         plan was to use monitored anesthesia care (MAC).                         Immediately prior to administration of medications,                         the patient was re-assessed for adequacy to receive                         sedatives. The heart rate, respiratory rate, oxygen                         saturations, blood pressure, adequacy of pulmonary                         ventilation, and response to care were monitored                         throughout the procedure. The physical status of the                         patient was re-assessed after the procedure.                        After obtaining informed consent, the endoscope was                         passed under direct vision. Throughout the procedure,                         the patient's blood pressure, pulse, and oxygen                         saturations were monitored continuously. The Endoscope                         was introduced through the mouth, and advanced to the                         second part of duodenum. The upper GI endoscopy was  accomplished without difficulty. The patient tolerated                         the procedure well. Findings:      A 3 cm hiatal hernia was present.      The exam of the esophagus was otherwise normal.      Localized mild inflammation characterized by erythema was found in the       gastric body. Biopsies were taken with a cold forceps for histology.       Estimated blood loss was minimal.      Multiple small sessile fundic gland polyps with no stigmata of recent       bleeding were found in the gastric fundus. Two polyps were removed with       a cold biopsy forceps. Resection and retrieval were complete. Estimated       blood loss was minimal.      The  exam of the stomach was otherwise normal.      Biopsies were taken with a cold forceps in the entire examined stomach       for Helicobacter pylori testing. Estimated blood loss was minimal.      The examined duodenum was normal. Impression:            - 3 cm hiatal hernia.                        - Gastritis. Biopsied.                        - Multiple fundic gland polyps. Resected and retrieved.                        - Normal examined duodenum.                        - Biopsies were taken with a cold forceps for                         Helicobacter pylori testing. Recommendation:        - Discharge patient to home.                        - Resume previous diet.                        - Continue present medications.                        - Await pathology results.                        - Return to referring physician as previously                         scheduled. Procedure Code(s):     --- Professional ---                        [phone], Esophagogastroduodenoscopy, flexible,                         transoral; with biopsy, single or multiple Diagnosis Code(s):     --- Professional ---  K44.9, Diaphragmatic hernia without obstruction or                         gangrene                        K29.70, Gastritis, unspecified, without bleeding                        K31.7, Polyp of stomach and duodenum                        R10.13, Epigastric pain CPT copyright 2019 [employer] Medical [employer]. All rights reserved. The codes documented in this report are preliminary and upon coder review may  be revised to meet current compliance requirements. Andrey Farmer MD, MD 08/09/2020 8:16:02 AM Number of Addenda: 0 Note Initiated On: 08/09/2020 8:00 AM Estimated Blood Loss:  Estimated blood loss was minimal.      [facility]

## 2020-08-09 NOTE — Anesthesia Preprocedure Evaluation (Signed)
Anesthesia Evaluation  Patient identified by MRN, date of birth, ID band Patient awake    Reviewed: Allergy & Precautions, NPO status , Patient's Chart, lab work & pertinent test results  History of Anesthesia Complications Negative for: history of anesthetic complications  Airway Mallampati: II  TM Distance: >3 FB Neck ROM: Full    Dental no notable dental hx.    Pulmonary neg pulmonary ROS, neg sleep apnea, neg COPD,    breath sounds clear to auscultation- rhonchi (-) wheezing      Cardiovascular hypertension, Pt. on medications (-) CAD, (-) Past MI, (-) Cardiac Stents and (-) CABG + Valvular Problems/Murmurs AI  Rhythm:Regular Rate:Normal - Systolic murmurs and - Diastolic murmurs    Neuro/Psych  Headaches, neg Seizures negative psych ROS   GI/Hepatic Neg liver ROS, hiatal hernia, GERD  ,  Endo/Other  neg diabetesHypothyroidism   Renal/GU CRFRenal disease     Musculoskeletal negative musculoskeletal ROS (+)   Abdominal (+) - obese,   Peds  Hematology  (+) anemia ,   Anesthesia Other Findings Cataracts, bilateral Chronic kidney disease     Comment:  Followed by Dr. Holley Raring Chronic kidney disease Colon polyp Constipation due to slow transit GERD (gastroesophageal reflux disease) Heart murmur History of cyst of breast History of hiatal hernia Hyperlipidemia Hypertension Hypothyroidism Melanoma of lower leg (HCC) Shingles Spinal stenosis in cervical region     Comment:  Dr. Sharlet Salina Subdural hematoma [facility]) Thyroid disease   Reproductive/Obstetrics                             Anesthesia Physical  Anesthesia Plan  ASA: III  Anesthesia Plan: General   Post-op Pain Management:    Induction: Intravenous  PONV Risk Score and Plan: 2 and Propofol infusion and TIVA  Airway Management Planned: Natural Airway and Nasal Cannula  Additional Equipment:   Intra-op Plan:    Post-operative Plan:   Informed Consent: I have reviewed the patients History and Physical, chart, labs and discussed the procedure including the risks, benefits and alternatives for the proposed anesthesia with the patient or authorized representative who has indicated his/her understanding and acceptance.     Dental advisory given  Plan Discussed with: CRNA and Anesthesiologist  Anesthesia Plan Comments:         Anesthesia Quick Evaluation

## 2020-08-12 ENCOUNTER — Encounter: Payer: Self-pay | Admitting: Gastroenterology

## 2020-08-13 LAB — SURGICAL PATHOLOGY

## 2020-08-14 ENCOUNTER — Other Ambulatory Visit: Payer: Self-pay | Admitting: Internal Medicine

## 2020-08-14 NOTE — Telephone Encounter (Signed)
rx sent in for zolft 50mg  q day #30 with one refill.

## 2020-08-14 NOTE — Telephone Encounter (Signed)
Should new sig read one tablet daily? Correct

## 2020-08-19 ENCOUNTER — Other Ambulatory Visit: Payer: PPO

## 2020-08-21 ENCOUNTER — Encounter: Payer: PPO | Admitting: Internal Medicine

## 2020-08-28 ENCOUNTER — Telehealth: Payer: Self-pay | Admitting: Internal Medicine

## 2020-08-28 NOTE — Telephone Encounter (Signed)
Patient's pharmacy , [facility] [address] told patient to call to have her  amLODipine (NORVASC) 5 MG tablet refilled

## 2020-08-29 ENCOUNTER — Other Ambulatory Visit: Payer: Self-pay

## 2020-08-29 MED ORDER — AMLODIPINE BESYLATE 5 MG PO TABS: 5.0000 mg | ORAL_TABLET | Freq: Every day | ORAL | 1 refills | Status: DC

## 2020-08-29 NOTE — Telephone Encounter (Signed)
Please call and find out what she has been taking and who has been refilling the amlodipine.  Confirm dose.

## 2020-08-29 NOTE — Telephone Encounter (Signed)
Verbal given by PCP to refill. Patient was given 2.5mg  from Dr Nicki Reaper and her kidney doctor upped dosage to 5mg . Dr Nicki Reaper was ok with upped dosage.

## 2020-09-07 ENCOUNTER — Other Ambulatory Visit: Payer: Self-pay | Admitting: Internal Medicine

## 2020-09-13 ENCOUNTER — Other Ambulatory Visit: Payer: Self-pay | Admitting: Internal Medicine

## 2020-09-20 ENCOUNTER — Other Ambulatory Visit (INDEPENDENT_AMBULATORY_CARE_PROVIDER_SITE_OTHER): Payer: PPO

## 2020-09-20 ENCOUNTER — Other Ambulatory Visit: Payer: Self-pay

## 2020-09-20 DIAGNOSIS — E7800 Pure hypercholesterolemia, unspecified: Secondary | ICD-10-CM

## 2020-09-20 DIAGNOSIS — I10 Essential (primary) hypertension: Secondary | ICD-10-CM | POA: Diagnosis not present

## 2020-09-20 DIAGNOSIS — R739 Hyperglycemia, unspecified: Secondary | ICD-10-CM | POA: Diagnosis not present

## 2020-09-20 LAB — CBC WITH DIFFERENTIAL/PLATELET
Basophils Absolute: 0 10*3/uL (ref 0.0–0.1)
Basophils Relative: 0.8 % (ref 0.0–3.0)
Eosinophils Absolute: 0.2 10*3/uL (ref 0.0–0.7)
Eosinophils Relative: 4.9 % (ref 0.0–5.0)
HCT: 36.8 % (ref 36.0–46.0)
Hemoglobin: 12.4 g/dL (ref 12.0–15.0)
Lymphocytes Relative: 48.2 % — ABNORMAL HIGH (ref 12.0–46.0)
Lymphs Abs: 1.7 10*3/uL (ref 0.7–4.0)
MCHC: 33.7 g/dL (ref 30.0–36.0)
MCV: 87.4 fl (ref 78.0–100.0)
Monocytes Absolute: 0.6 10*3/uL (ref 0.1–1.0)
Monocytes Relative: 15.8 % — ABNORMAL HIGH (ref 3.0–12.0)
Neutro Abs: 1.1 10*3/uL — ABNORMAL LOW (ref 1.4–7.7)
Neutrophils Relative %: 30.3 % — ABNORMAL LOW (ref 43.0–77.0)
Platelets: 170 10*3/uL (ref 150.0–400.0)
RBC: 4.21 Mil/uL (ref 3.87–5.11)
RDW: 13.6 % (ref 11.5–15.5)
WBC: 3.6 10*3/uL — ABNORMAL LOW (ref 4.0–10.5)

## 2020-09-20 LAB — HEMOGLOBIN A1C: Hgb A1c MFr Bld: 5.6 % (ref 4.6–6.5)

## 2020-09-20 LAB — BASIC METABOLIC PANEL
BUN: 28 mg/dL — ABNORMAL HIGH (ref 6–23)
CO2: 25 mEq/L (ref 19–32)
Calcium: 9.7 mg/dL (ref 8.4–10.5)
Chloride: 108 mEq/L (ref 96–112)
Creatinine, Ser: 1.52 mg/dL — ABNORMAL HIGH (ref 0.40–1.20)
GFR: 32.44 mL/min — ABNORMAL LOW (ref >60.00)
Glucose, Bld: 95 mg/dL (ref 70–99)
Potassium: 5.2 mEq/L — ABNORMAL HIGH (ref 3.5–5.1)
Sodium: 141 mEq/L (ref 135–145)

## 2020-09-20 LAB — HEPATIC FUNCTION PANEL
ALT: 17 U/L (ref 0–35)
AST: 25 U/L (ref 0–37)
Albumin: 3.9 g/dL (ref 3.5–5.2)
Alkaline Phosphatase: 150 U/L — ABNORMAL HIGH (ref 39–117)
Bilirubin, Direct: 0.1 mg/dL (ref 0.0–0.3)
Total Bilirubin: 0.6 mg/dL (ref 0.2–1.2)
Total Protein: 6.6 g/dL (ref 6.0–8.3)

## 2020-09-20 LAB — LIPID PANEL
Cholesterol: 143 mg/dL (ref 0–200)
HDL: 37.3 mg/dL — ABNORMAL LOW (ref >39.00)
LDL Cholesterol: 75 mg/dL (ref 0–99)
NonHDL: 105.9
Total CHOL/HDL Ratio: 4
Triglycerides: 153 mg/dL — ABNORMAL HIGH (ref 0.0–149.0)
VLDL: 30.6 mg/dL (ref 0.0–40.0)

## 2020-09-23 ENCOUNTER — Encounter: Payer: Self-pay | Admitting: Internal Medicine

## 2020-09-23 ENCOUNTER — Other Ambulatory Visit: Payer: Self-pay

## 2020-09-23 ENCOUNTER — Ambulatory Visit (INDEPENDENT_AMBULATORY_CARE_PROVIDER_SITE_OTHER): Payer: PPO | Admitting: Internal Medicine

## 2020-09-23 VITALS — BP 132/68 | HR 62 | Temp 96.4°F | Ht 64.02 in | Wt 149.2 lb

## 2020-09-23 DIAGNOSIS — R739 Hyperglycemia, unspecified: Secondary | ICD-10-CM | POA: Diagnosis not present

## 2020-09-23 DIAGNOSIS — E039 Hypothyroidism, unspecified: Secondary | ICD-10-CM

## 2020-09-23 DIAGNOSIS — D509 Iron deficiency anemia, unspecified: Secondary | ICD-10-CM

## 2020-09-23 DIAGNOSIS — I10 Essential (primary) hypertension: Secondary | ICD-10-CM | POA: Diagnosis not present

## 2020-09-23 DIAGNOSIS — E875 Hyperkalemia: Secondary | ICD-10-CM | POA: Diagnosis not present

## 2020-09-23 DIAGNOSIS — Z0000 Encounter for general adult medical examination without abnormal findings: Secondary | ICD-10-CM

## 2020-09-23 DIAGNOSIS — N184 Chronic kidney disease, stage 4 (severe): Secondary | ICD-10-CM

## 2020-09-23 DIAGNOSIS — F439 Reaction to severe stress, unspecified: Secondary | ICD-10-CM | POA: Diagnosis not present

## 2020-09-23 DIAGNOSIS — R748 Abnormal levels of other serum enzymes: Secondary | ICD-10-CM

## 2020-09-23 DIAGNOSIS — I700 Atherosclerosis of aorta: Secondary | ICD-10-CM | POA: Diagnosis not present

## 2020-09-23 DIAGNOSIS — E7800 Pure hypercholesterolemia, unspecified: Secondary | ICD-10-CM | POA: Diagnosis not present

## 2020-09-23 DIAGNOSIS — K760 Fatty (change of) liver, not elsewhere classified: Secondary | ICD-10-CM | POA: Diagnosis not present

## 2020-09-23 DIAGNOSIS — K219 Gastro-esophageal reflux disease without esophagitis: Secondary | ICD-10-CM

## 2020-09-23 LAB — POTASSIUM: Potassium: 5.2 mEq/L — ABNORMAL HIGH (ref 3.5–5.1)

## 2020-09-23 NOTE — Progress Notes (Signed)
Patient ID: Caitlin Lin, female   DOB: [birth year], 80 y.o.   MRN: [id number]   Subjective:    Patient ID: Caitlin Lin, female    DOB: 1940/10/12, 80 y.o.   MRN: [id number]  HPI This visit occurred during the SARS-CoV-2 public health emergency.  Safety protocols were in place, including screening questions prior to the visit, additional usage of staff PPE, and extensive cleaning of exam room while observing appropriate contact time as indicated for disinfecting solutions.   Patient here for a scheduled follow up/physical exam. Has been evaluated recently by GI for dyspepsia and GERD.  Per GI, improved after starting SSRI.  On protonix.  Recommended weaning off reglan.  She is eating.  Weight is stable.  No chest pain or sob reported.  No abdominal pain.  Bowels moving.  Cantaloupe and strawberries - cause diarrhea.  Discussed trying to avoid foods that aggravate.    Past Medical History:  Diagnosis Date   Cataracts, bilateral    Chronic kidney disease    Followed by Dr. Holley Raring   Chronic kidney disease    Colon polyp    Constipation due to slow transit    Diarrhea    Diarrhea    GERD (gastroesophageal reflux disease)    Heart murmur    History of cyst of breast    History of hiatal hernia    Hyperlipidemia    Hypertension    Hypothyroidism    Melanoma of lower leg ([place]) 2013   Shingles 2013   Shingles    Spinal stenosis in cervical region    Dr. Sharlet Salina   Subdural hematoma [facility])    Thyroid disease    Past Surgical History:  Procedure Laterality Date   ABDOMINAL HYSTERECTOMY  1974   ANTERIOR AND POSTERIOR VAGINAL REPAIR     ANTERIOR AND POSTERIOR VAGINAL REPAIR W/ SACROSPINOUS LIGAMENT SUSPENSION Right    BREAST BIOPSY  1963   Benign per pt's report   COLONOSCOPY     ELECTROMAGNETIC NAVIGATION BROCHOSCOPY Right 01/09/2019   Procedure: ELECTROMAGNETIC NAVIGATION BRONCHOSCOPY;  Surgeon: Tyler Pita, MD;  Location: [facility] ORS;  Service: Cardiopulmonary;  Laterality:  Right;   ESOPHAGOGASTRODUODENOSCOPY     ESOPHAGOGASTRODUODENOSCOPY (EGD) WITH PROPOFOL N/A 11/10/2016   Procedure: ESOPHAGOGASTRODUODENOSCOPY (EGD) WITH PROPOFOL;  Surgeon: Lollie Sails, MD;  Location: [facility] ENDOSCOPY;  Service: Endoscopy;  Laterality: N/A;   ESOPHAGOGASTRODUODENOSCOPY (EGD) WITH PROPOFOL N/A 09/09/2018   Procedure: ESOPHAGOGASTRODUODENOSCOPY (EGD) WITH PROPOFOL;  Surgeon: Lollie Sails, MD;  Location: [facility] ENDOSCOPY;  Service: Endoscopy;  Laterality: N/A;   ESOPHAGOGASTRODUODENOSCOPY (EGD) WITH PROPOFOL N/A 10/27/2019   Procedure: ESOPHAGOGASTRODUODENOSCOPY (EGD) WITH PROPOFOL;  Surgeon: Robert Bellow, MD;  Location: [facility] ENDOSCOPY;  Service: Endoscopy;  Laterality: N/A;   ESOPHAGOGASTRODUODENOSCOPY (EGD) WITH PROPOFOL N/A 08/09/2020   Procedure: ESOPHAGOGASTRODUODENOSCOPY (EGD) WITH PROPOFOL;  Surgeon: Lesly Rubenstein, MD;  Location: [facility] ENDOSCOPY;  Service: Endoscopy;  Laterality: N/A;   LUNG BIOPSY  2007   Benign per pt's report   LUNG BIOPSY Right    OOPHORECTOMY  1970   PERINEOPLASTY  12/17/2013   sling for stress incontinence  12/18/2011   Family History  Problem Relation Age of Onset   Arthritis Mother    Liver disease Mother    Cirrhosis Mother    Liver cancer Mother    Heart disease Father    Heart attack Father    Coronary artery disease Father    Depression Brother    Breast cancer Other  Breast cancer Cousin        maternal cousins   Liver cancer Cousin    Diabetes Maternal Aunt    Social History   Socioeconomic History   Marital status: Married    Spouse name: Not on file   Number of children: 2   Years of education: Not on file   Highest education level: Not on file  Occupational History   Occupation: Retired  Tobacco Use   Smoking status: Never   Smokeless tobacco: Never  Vaping Use   Vaping Use: Never used  Substance and Sexual Activity   Alcohol use: No   Drug use: No   Sexual activity: Yes  Other Topics Concern    Not on file  Social History Narrative   Lives in [place] with husband. Has 2 children boy and girl, 4 grandchildren. Retired Presenter, broadcasting.       Daily Caffeine Use:  NO   Regular Exercise -  Not at this time due to back pain, would like to resume soon   Social Determinants of Health   Financial Resource Strain: Low Risk    Difficulty of Paying Living Expenses: Not hard at all  Food Insecurity: No Food Insecurity   Worried About Charity fundraiser in the Last Year: Never true   Ran Out of Food in the Last Year: Never true  Transportation Needs: No Transportation Needs   Lack of Transportation (Medical): No   Lack of Transportation (Non-Medical): No  Physical Activity: Not on file  Stress: No Stress Concern Present   Feeling of Stress : Not at all  Social Connections: Not on file    Review of Systems  Constitutional:  Negative for appetite change and unexpected weight change.  HENT:  Negative for congestion and sinus pressure.   Respiratory:  Negative for cough, chest tightness and shortness of breath.   Cardiovascular:  Negative for chest pain and palpitations.  Gastrointestinal:  Negative for abdominal pain, diarrhea, nausea and vomiting.  Genitourinary:  Negative for difficulty urinating and dysuria.  Musculoskeletal:  Negative for joint swelling and myalgias.  Skin:  Negative for color change and rash.  Neurological:  Negative for dizziness, light-headedness and headaches.  Psychiatric/Behavioral:  Negative for agitation and dysphoric mood.       Objective:    Physical Exam Vitals reviewed.  Constitutional:      General: She is not in acute distress.    Appearance: Normal appearance.  HENT:     Head: Normocephalic and atraumatic.     Right Ear: External ear normal.     Left Ear: External ear normal.  Eyes:     General: No scleral icterus.       Right eye: No discharge.        Left eye: No discharge.     Conjunctiva/sclera: Conjunctivae normal.  Neck:      Thyroid: No thyromegaly.  Cardiovascular:     Rate and Rhythm: Normal rate and regular rhythm.  Pulmonary:     Effort: No respiratory distress.     Breath sounds: Normal breath sounds. No wheezing.  Abdominal:     General: Bowel sounds are normal.     Palpations: Abdomen is soft.     Tenderness: There is no abdominal tenderness.  Musculoskeletal:        General: No swelling or tenderness.     Cervical back: Neck supple. No tenderness.  Lymphadenopathy:     Cervical: No cervical adenopathy.  Skin:  Findings: No erythema or rash.  Neurological:     Mental Status: She is alert.  Psychiatric:        Mood and Affect: Mood normal.        Behavior: Behavior normal.    BP 132/68   Pulse 62   Temp (!) 96.4 F (35.8 C)   Ht 5' 4.02" (1.626 m)   Wt 149 lb 3.2 oz (67.7 kg)   SpO2 99%   BMI 25.60 kg/m  Wt Readings from Last 3 Encounters:  09/23/20 149 lb 3.2 oz (67.7 kg)  08/09/20 150 lb (68 kg)  05/16/20 151 lb 8 oz (68.7 kg)    Outpatient Encounter Medications as of 09/23/2020  Medication Sig   amLODipine (NORVASC) 5 MG tablet Take 1 tablet (5 mg total) by mouth daily.   aspirin 81 MG tablet Take 1 tablet (81 mg total) by mouth daily.   atorvastatin (LIPITOR) 20 MG tablet Take 1 tablet by mouth once daily   Cholecalciferol 25 MCG (1000 UT) tablet Take by mouth.   Cranberry 400 MG TABS Take 400 mg by mouth daily.   EUTHYROX 75 MCG tablet Take 1 tablet by mouth once daily with breakfast   fexofenadine-pseudoephedrine (ALLEGRA-D 24) 180-240 MG 24 hr tablet Take 1 tablet by mouth daily.   Glucosamine-Chondroit-Vit C-Mn (GLUCOSAMINE CHONDR 500 COMPLEX PO) Take 2 tablets by mouth daily.   losartan (COZAAR) 50 MG tablet Take 1 tablet by mouth once daily   metoCLOPramide (REGLAN) 5 MG tablet Take 1 tablet (5 mg total) by mouth 2 (two) times daily.   Multiple Vitamin (MULTIVITAMIN) tablet Take 1 tablet by mouth daily.   Omega-3 Fatty Acids (FISH OIL) 1000 MG CAPS Take 1,000 mg by  mouth daily.   pantoprazole (PROTONIX) 40 MG tablet One tablet 30 minutes before breakfast and one tablet 30 minutes before your evening meal - prn.   Probiotic Product (PROBIOTIC DAILY PO) Take by mouth daily.   sertraline (ZOLOFT) 50 MG tablet TAKE ONE  TABLET  DAILY   No facility-administered encounter medications on file as of 09/23/2020.     Lab Results  Component Value Date   WBC 3.6 (L) 09/20/2020   HGB 12.4 09/20/2020   HCT 36.8 09/20/2020   PLT 170.0 09/20/2020   GLUCOSE 95 09/20/2020   CHOL 143 09/20/2020   TRIG 153.0 (H) 09/20/2020   HDL 37.30 (L) 09/20/2020   LDLDIRECT 84.0 07/22/2018   LDLCALC 75 09/20/2020   ALT 17 09/20/2020   AST 25 09/20/2020   NA 141 09/20/2020   K 4.6 09/25/2020   CL 108 09/20/2020   CREATININE 1.52 (H) 09/20/2020   BUN 28 (H) 09/20/2020   CO2 25 09/20/2020   TSH 0.85 11/21/2019   HGBA1C 5.6 09/20/2020   MICROALBUR 2.1 (H) 03/27/2015    CT CHEST ABDOMEN PELVIS W CONTRAST  Result Date: 06/30/2020 CLINICAL DATA:  Weight loss, dyspepsia, shortness of breath EXAM: CT CHEST, ABDOMEN, AND PELVIS WITH CONTRAST TECHNIQUE: Multidetector CT imaging of the chest, abdomen and pelvis was performed following the standard protocol during bolus administration of intravenous contrast. CONTRAST:  3m OMNIPAQUE IOHEXOL 300 MG/ML SOLN, additional oral enteric contrast COMPARISON:  CT chest abdomen pelvis, 08/28/2019 FINDINGS: CT CHEST FINDINGS Cardiovascular: Aortic atherosclerosis. Normal heart size. No pericardial effusion. Mediastinum/Nodes: No enlarged mediastinal, hilar, or axillary lymph nodes. Small hiatal hernia. Thyroid gland, trachea, and esophagus demonstrate no significant findings. Lungs/Pleura: Partially calcified, irregular nodule of the posterior right upper lobe measuring 1.5 x 1.3 cm,  stable and benign (series 3, image 61). Partially calcified adjacent nodules in the lingula, stable and benign, measuring 6 mm (series 3, image 68). No pleural  effusion or pneumothorax. Musculoskeletal: No chest wall mass or suspicious bone lesions identified. CT ABDOMEN PELVIS FINDINGS Hepatobiliary: No solid liver abnormality is seen. No gallstones, gallbladder wall thickening, or biliary dilatation. Pancreas: Unremarkable. No pancreatic ductal dilatation or surrounding inflammatory changes. Spleen: Normal in size without significant abnormality. Adrenals/Urinary Tract: Adrenal glands are unremarkable. Kidneys are normal, without renal calculi, solid lesion, or hydronephrosis. Pelvic floor prolapse with small cystocele (series 6, image 99). Stomach/Bowel: Stomach is within normal limits. Diverticulum of the descending duodenum. Appendix not clearly visualized and may be surgically absent. No evidence of bowel wall thickening, distention, or inflammatory changes. Sigmoid diverticulosis. Pelvic floor prolapse with rectocele. Vascular/Lymphatic: No significant vascular findings are present. No enlarged abdominal or pelvic lymph nodes. Reproductive: Status post hysterectomy. Other: No abdominal wall hernia or abnormality. No abdominopelvic ascites. Musculoskeletal: No acute or significant osseous findings. IMPRESSION: 1. No CT findings of the chest, abdomen, or pelvis to explain weight loss or symptoms. 2. Small hiatal hernia. 3. Stable, benign pulmonary nodules for which no further follow-up or characterization is required. 4. Pelvic floor prolapse status post hysterectomy with cystocele and rectocele. 5. Diverticulosis without evidence of diverticulitis. Aortic Atherosclerosis (ICD10-I70.0). Electronically Signed   By: Eddie Candle M.D.   On: 06/30/2020 11:35       Assessment & Plan:   Problem List Items Addressed This Visit     Anemia, iron deficiency    Follow cbc.        Aortic atherosclerosis (HCC)    Continue lipitor.        CKD (chronic kidney disease) stage 4, GFR 15-29 ml/min (HCC)    Continue f/u with nephrology.  Avoid antiinflammatories.         Elevated alkaline phosphatase level    Alkaline phosphatase level increased on recent lab check.  Remainder of liver panel wnl.  Recheck liver panel in 4 weeks to confirm wnl.        Relevant Orders   Hepatic function panel   Fatty liver    Diet and exercise.  Follow liver panel.        GERD (gastroesophageal reflux disease)    Recent EGD ok.  Continue protonix.        Health care maintenance    Physical today 09/23/20.  Mammogram 11/06/19 - Birads I.  Colonoscopy 06/2011.  Recommended f/u in 2023.         Hypercholesterolemia    Continue lipitor.  Low cholesterol diet and exercise.  Follow lipid panel and liver function tests.         Hyperglycemia    Low carb diet and exercise.  Follow met b and a1c.         Hyperkalemia    Potassium slightly increased on recent lab check.  Recheck potassium today.         Relevant Orders   Potassium (Completed)   Stress    On zoloft and doing well on this medication.  Continue zoloft.  Follow.        Hypertension (Chronic)    Blood pressure as outlined. Recheck improved.  Continue amlodipine and losartan.  Follow pressures.  Follow metabolic panel.        Hypothyroidism (Chronic)    On thyroid replacement.  Follow tsh.        Other Visit Diagnoses  Routine general medical examination at a health care facility    -  Primary        Einar Pheasant, MD

## 2020-09-24 ENCOUNTER — Other Ambulatory Visit: Payer: Self-pay | Admitting: Internal Medicine

## 2020-09-24 DIAGNOSIS — E875 Hyperkalemia: Secondary | ICD-10-CM

## 2020-09-24 NOTE — Progress Notes (Signed)
Order placed for f/u potassium check.  

## 2020-09-25 ENCOUNTER — Other Ambulatory Visit
Admission: RE | Admit: 2020-09-25 | Discharge: 2020-09-25 | Disposition: A | Discharge status: Home / Self Care | Payer: PPO | Attending: Internal Medicine | Admitting: Internal Medicine

## 2020-09-25 DIAGNOSIS — E875 Hyperkalemia: Secondary | ICD-10-CM

## 2020-09-25 LAB — POTASSIUM: Potassium: 4.6 mmol/L (ref 3.5–5.1)

## 2020-09-30 ENCOUNTER — Encounter: Payer: Self-pay | Admitting: Internal Medicine

## 2020-09-30 DIAGNOSIS — R748 Abnormal levels of other serum enzymes: Secondary | ICD-10-CM | POA: Insufficient documentation

## 2020-09-30 NOTE — Assessment & Plan Note (Signed)
On thyroid replacement.  Follow tsh.  

## 2020-09-30 NOTE — Assessment & Plan Note (Signed)
Diet and exercise.  Follow liver panel.

## 2020-09-30 NOTE — Assessment & Plan Note (Signed)
Alkaline phosphatase level increased on recent lab check.  Remainder of liver panel wnl.  Recheck liver panel in 4 weeks to confirm wnl.

## 2020-09-30 NOTE — Assessment & Plan Note (Signed)
Blood pressure as outlined. Recheck improved.  Continue amlodipine and losartan.  Follow pressures.  Follow metabolic panel.

## 2020-09-30 NOTE — Assessment & Plan Note (Signed)
Potassium slightly increased on recent lab check.  Recheck potassium today.

## 2020-09-30 NOTE — Assessment & Plan Note (Signed)
Continue f/u with nephrology.  Avoid antiinflammatories.

## 2020-09-30 NOTE — Assessment & Plan Note (Signed)
Physical today 09/23/20.  Mammogram 11/06/19 - Birads I.  Colonoscopy 06/2011.  Recommended f/u in 2023.

## 2020-09-30 NOTE — Assessment & Plan Note (Signed)
Recent EGD ok.  Continue protonix.

## 2020-09-30 NOTE — Assessment & Plan Note (Signed)
Continue lipitor  ?

## 2020-09-30 NOTE — Assessment & Plan Note (Signed)
Continue lipitor.  Low cholesterol diet and exercise.  Follow lipid panel and liver function tests.

## 2020-09-30 NOTE — Assessment & Plan Note (Signed)
Low carb diet and exercise.  Follow met b and a1c.   

## 2020-09-30 NOTE — Assessment & Plan Note (Signed)
On zoloft and doing well on this medication.  Continue zoloft.  Follow.

## 2020-09-30 NOTE — Assessment & Plan Note (Signed)
Follow cbc.  

## 2020-10-01 ENCOUNTER — Ambulatory Visit: Payer: PPO | Admitting: Pulmonary Disease

## 2020-10-01 ENCOUNTER — Encounter: Payer: Self-pay | Admitting: Pulmonary Disease

## 2020-10-01 ENCOUNTER — Other Ambulatory Visit: Payer: Self-pay | Admitting: Internal Medicine

## 2020-10-01 ENCOUNTER — Other Ambulatory Visit: Payer: Self-pay

## 2020-10-01 VITALS — BP 124/62 | HR 69 | Temp 97.8°F | Ht 64.0 in | Wt 148.4 lb

## 2020-10-01 DIAGNOSIS — R918 Other nonspecific abnormal finding of lung field: Secondary | ICD-10-CM | POA: Diagnosis not present

## 2020-10-01 DIAGNOSIS — R634 Abnormal weight loss: Secondary | ICD-10-CM

## 2020-10-01 DIAGNOSIS — R059 Cough, unspecified: Secondary | ICD-10-CM | POA: Diagnosis not present

## 2020-10-01 DIAGNOSIS — Z1231 Encounter for screening mammogram for malignant neoplasm of breast: Secondary | ICD-10-CM

## 2020-10-01 NOTE — Patient Instructions (Signed)
Your CT scan of the chest done in April looked good with no worrisome findings.  The spots in your lung are from prior infection and nothing to worry about.   We will see him in follow-up in 6 months time call sooner should any new problems arise.

## 2020-10-01 NOTE — Progress Notes (Signed)
Subjective:    Patient ID: Caitlin Lin, female    DOB: [birth year], 80 y.o.   MRN: [id number] Chief Complaint  Patient presents with   Follow-up   HPI Caitlin Lin is a 80 year old life long never smoker, with nodular clusters on the LEFT and RIGHT upper lobes.  She had a negative navigational bronchoscopy on 09 January 2019 where the right upper lobe lung nodule cluster was sampled. She was originally referred to [place] by Dr. Nestor Lin.  Her primary care physician is Dr. Einar Lin.  Her last visit here was on 08 April 2020 at that time she was doing well from the respiratory standpoint.  She had a CT scan of the chest abdomen and pelvis on 30 June 2020 that showed stability to regression  in her previously noted nodular densities that are believed to be due to chronic inflammatory/infectious process, suspect MAI.  She had an extensive CT at that time due to weight loss.  Nothing to suggest cause of weight loss in her chest portion of the CT.  She has not had any shortness of breath, orthopnea or paroxysmal nocturnal dyspnea..   She has not had any cough, sputum production or hemoptysis. No palpitations. From the pulmonary standpoint she has no symptoms whatsoever.   Review of Systems A 10 point review of systems was performed and it is as noted above otherwise negative.  Patient Active Problem List   Diagnosis Date Noted   Elevated alkaline phosphatase level 09/30/2020   Hyperkalemia 09/23/2020   Stress 05/18/2020   Fatigue 03/30/2020   Leg swelling 03/30/2020   Dysphagia 08/09/2019   Sleep difficulties 11/23/2018   Numbness of toes 11/23/2018   Pelvic pressure in female 08/28/2018   Hyperglycemia 11/20/2017   Aortic atherosclerosis ([place]) 06/16/2017   Low back pain 06/06/2017   Abdominal pain 06/03/2017   Fatty liver 02/13/2017   Aortic regurgitation 07/01/2016   Health care maintenance 05/12/2016   Chest pain 05/12/2016   Angular cheilitis 03/27/2015   TMJ arthralgia  09/25/2014   GERD (gastroesophageal reflux disease) 12/05/2013   Osteopenia 10/30/2013   Hypercholesterolemia 03/04/2013   Medicare annual wellness visit, subsequent 09/05/2012   Anemia, iron deficiency 06/08/2012   Screening for breast cancer 08/03/2011   Rectal prolapse 08/03/2011   Hypertension 02/09/2011   CKD (chronic kidney disease) stage 4, GFR 15-29 ml/min ([place]) 02/09/2011   Hypothyroidism 02/09/2011   Social History   Tobacco Use   Smoking status: Never   Smokeless tobacco: Never  Substance Use Topics   Alcohol use: No   Allergies  Allergen Reactions   Quinapril Other (See Comments)    Hyperkalemia Hyperkalemia Hyperkalemia   Codeine Rash   Current Meds  Medication Sig   amLODipine (NORVASC) 5 MG tablet Take 1 tablet (5 mg total) by mouth daily.   aspirin 81 MG tablet Take 1 tablet (81 mg total) by mouth daily.   atorvastatin (LIPITOR) 20 MG tablet Take 1 tablet by mouth once daily   Cholecalciferol 25 MCG (1000 UT) tablet Take by mouth.   Cranberry 400 MG TABS Take 400 mg by mouth daily.   EUTHYROX 75 MCG tablet Take 1 tablet by mouth once daily with breakfast   fexofenadine-pseudoephedrine (ALLEGRA-D 24) 180-240 MG 24 hr tablet Take 1 tablet by mouth daily.   Glucosamine-Chondroit-Vit C-Mn (GLUCOSAMINE CHONDR 500 COMPLEX PO) Take 2 tablets by mouth daily.   losartan (COZAAR) 50 MG tablet Take 1 tablet by mouth once daily   Multiple Vitamin (MULTIVITAMIN)  tablet Take 1 tablet by mouth daily.   Omega-3 Fatty Acids (FISH OIL) 1000 MG CAPS Take 1,000 mg by mouth daily.   pantoprazole (PROTONIX) 40 MG tablet One tablet 30 minutes before breakfast and one tablet 30 minutes before your evening meal - prn.   Probiotic Product (PROBIOTIC DAILY PO) Take by mouth daily.   sertraline (ZOLOFT) 50 MG tablet TAKE ONE  TABLET  DAILY   Immunization History  Administered Date(s) Administered   Fluad Quad(high Dose 65+) 12/22/2019   Hepatitis A 07/14/2017, 08/17/2017    Hepatitis B 07/14/2017, 08/17/2017   Influenza Split 12/15/2010, 03/07/2012   Influenza, High Dose Seasonal PF 12/21/2016, 11/17/2017, 12/26/2018   Influenza,inj,Quad PF,6+ Mos 12/05/2013   Influenza-Unspecified 12/25/2014, 01/03/2016, 12/28/2016   PFIZER(Purple Top)SARS-COV-2 Vaccination 05/29/2019, 06/19/2019   Pneumococcal Conjugate-13 03/12/2014   Pneumococcal Polysaccharide-23 12/09/2005, 03/03/2013   Tdap 03/07/2012   Zoster, Live 12/09/2009       Objective:   Physical Exam BP 124/62 (BP Location: Left Arm, Patient Position: Sitting, Cuff Size: Normal)   Pulse 69   Temp 97.8 F (36.6 C) (Temporal)   Ht 5\' 4"  (1.626 m)   Wt 148 lb 6.4 oz (67.3 kg)   SpO2 98%   BMI 25.47 kg/m   GENERAL: Well-developed, well-nourished female, age-appropriate, no acute distress, fully ambulatory. No conversational dyspnea.  Mild bronzing of the skin. HEAD: Normocephalic, atraumatic. EYES: Pupils equal, round, reactive to light.  No scleral icterus. MOUTH: Nose/mouth/throat not examined due to masking requirements for COVID 19.Marland Kitchen NECK: Supple. No thyromegaly. Trachea midline. No JVD.  No adenopathy. PULMONARY: Good air entry bilaterally.  No adventitious sounds. CARDIOVASCULAR: S1 and S2. Regular rate and rhythm. No rubs, murmurs or gallops heard. ABDOMEN: Benign. MUSCULOSKELETAL: No joint deformity, no clubbing, no edema. NEUROLOGIC: No focal deficits, no gait disturbance, fluent speech. SKIN: Intact,warm,dry. On limited exam, no rashes. PSYCH: Mood and behavior normal.   Representative slice of CT performed October 2020 showing cluster of nodules right upper lobe:   Representative slice of CT performed April 2022 showing regression of the cluster of nodules:      Assessment & Plan:     ICD-10-CM   1. Lung nodules  R91.8    These are benign Nodules have continued to regress over time Negative navigational bronchoscopy 2020    2. Cough  R05.9    This symptom has completely  resolved     3. Weight loss  R63.4    Cannot be explained by pulmonary etiology Reviewed patient's labs hyperkalemia noted Discussed with Dr. Nicki Reaper via secure chat Query evaluate adrenal axis     Discussion:  From the pulmonary standpoint the patient is doing well.  Radiographically her lesions continue to regress indicating inflammatory etiology for these lesions.  She has been having some issues with weight loss that cannot be explained by pulmonary etiology.  Discussed with Dr. Einar Lin secure chat query evaluate adrenal axis (query Addison's) given issues with mild bronzing of the skin and hyperkalemia.  Defer work-up to Dr. Nicki Reaper.  We will see the patient otherwise in 6 months time.  If she remains stable and no evidence of growth of her pulmonary nodules she can be then released to the care of her primary care physician.  Renold Don, MD [facility]   *This note was dictated using voice recognition software/Dragon.  Despite best efforts to proofread, errors can occur which can change the meaning.  Any change was purely unintentional.

## 2020-10-02 ENCOUNTER — Encounter: Payer: Self-pay | Admitting: Pulmonary Disease

## 2020-10-14 ENCOUNTER — Encounter: Payer: Self-pay | Admitting: Cardiology

## 2020-10-14 ENCOUNTER — Other Ambulatory Visit: Payer: Self-pay

## 2020-10-14 ENCOUNTER — Ambulatory Visit: Payer: PPO | Admitting: Cardiology

## 2020-10-14 VITALS — BP 126/80 | HR 66 | Ht 64.0 in | Wt 149.0 lb

## 2020-10-14 DIAGNOSIS — E7800 Pure hypercholesterolemia, unspecified: Secondary | ICD-10-CM | POA: Diagnosis not present

## 2020-10-14 DIAGNOSIS — I2584 Coronary atherosclerosis due to calcified coronary lesion: Secondary | ICD-10-CM

## 2020-10-14 DIAGNOSIS — I2510 Atherosclerotic heart disease of native coronary artery without angina pectoris: Secondary | ICD-10-CM | POA: Diagnosis not present

## 2020-10-14 DIAGNOSIS — I10 Essential (primary) hypertension: Secondary | ICD-10-CM | POA: Diagnosis not present

## 2020-10-14 NOTE — Progress Notes (Signed)
Cardiology Office Note:    Date:  10/14/2020   ID:  Caitlin Lin, DOB 1941/01/31, MRN [id number]  PCP:  Einar Pheasant, MD  [facility] Cardiologist:  Kate Sable, MD  [facility] Electrophysiologist:  None   Referring MD: Einar Pheasant, MD   Chief Complaint  Patient presents with   Other    6 month follow up. Meds reviewed verbally with patient.      History of Present Illness:    Caitlin Lin is a 80 y.o. female with a hx of hypertension, hyperlipidemia, mild aortic regurgitation, coronary calcifications, who presents for follow-up.    She was last seen due to symptoms of chest pain not related with exertion.  Due to coronary artery calcifications noted on chest CT, Lexiscan Myoview was ordered.  She now presents for follow-up.  She states feeling well, denies any further episodes of chest pain.  Blood pressures have been normal at home.  Takes aspirin and statin as prescribed.  Has no other concerns at this time.   Prior notes Echo 82/9562 normal systolic, impaired relaxation, EF 60 to 65%.  Mild aortic regurgitation Chest CT without contrast 08/2019, coronary artery calcifications.  Past Medical History:  Diagnosis Date   Cataracts, bilateral    Chronic kidney disease    Followed by Dr. Holley Raring   Chronic kidney disease    Colon polyp    Constipation due to slow transit    Diarrhea    Diarrhea    GERD (gastroesophageal reflux disease)    Heart murmur    History of cyst of breast    History of hiatal hernia    Hyperlipidemia    Hypertension    Hypothyroidism    Melanoma of lower leg ([place]) 2013   Shingles 2013   Shingles    Spinal stenosis in cervical region    Dr. Sharlet Salina   Subdural hematoma [facility])    Thyroid disease     Past Surgical History:  Procedure Laterality Date   ABDOMINAL HYSTERECTOMY  1974   ANTERIOR AND POSTERIOR VAGINAL REPAIR     ANTERIOR AND POSTERIOR VAGINAL REPAIR W/ SACROSPINOUS LIGAMENT SUSPENSION Right    BREAST BIOPSY   1963   Benign per pt's report   COLONOSCOPY     ELECTROMAGNETIC NAVIGATION BROCHOSCOPY Right 01/09/2019   Procedure: ELECTROMAGNETIC NAVIGATION BRONCHOSCOPY;  Surgeon: Tyler Pita, MD;  Location: [facility] ORS;  Service: Cardiopulmonary;  Laterality: Right;   ESOPHAGOGASTRODUODENOSCOPY     ESOPHAGOGASTRODUODENOSCOPY (EGD) WITH PROPOFOL N/A 11/10/2016   Procedure: ESOPHAGOGASTRODUODENOSCOPY (EGD) WITH PROPOFOL;  Surgeon: Lollie Sails, MD;  Location: [facility] ENDOSCOPY;  Service: Endoscopy;  Laterality: N/A;   ESOPHAGOGASTRODUODENOSCOPY (EGD) WITH PROPOFOL N/A 09/09/2018   Procedure: ESOPHAGOGASTRODUODENOSCOPY (EGD) WITH PROPOFOL;  Surgeon: Lollie Sails, MD;  Location[facility] ENDOSCOPY;  Service: Endoscopy;  Laterality: N/A;   ESOPHAGOGASTRODUODENOSCOPY (EGD) WITH PROPOFOL N/A 10/27/2019   Procedure: ESOPHAGOGASTRODUODENOSCOPY (EGD) WITH PROPOFOL;  Surgeon: Robert Bellow, MD;  Location: [facility] ENDOSCOPY;  Service: Endoscopy;  Laterality: N/A;   ESOPHAGOGASTRODUODENOSCOPY (EGD) WITH PROPOFOL N/A 08/09/2020   Procedure: ESOPHAGOGASTRODUODENOSCOPY (EGD) WITH PROPOFOL;  Surgeon: Lesly Rubenstein, MD;  Location: [facility] ENDOSCOPY;  Service: Endoscopy;  Laterality: N/A;   LUNG BIOPSY  2007   Benign per pt's report   LUNG BIOPSY Right    OOPHORECTOMY  1970   PERINEOPLASTY  12/17/2013   sling for stress incontinence  12/18/2011    Current Medications: Current Meds  Medication Sig   amLODipine (NORVASC) 5 MG tablet Take  1 tablet (5 mg total) by mouth daily.   aspirin 81 MG tablet Take 1 tablet (81 mg total) by mouth daily.   atorvastatin (LIPITOR) 20 MG tablet Take 1 tablet by mouth once daily   Cholecalciferol 25 MCG (1000 UT) tablet Take by mouth.   Cranberry 400 MG TABS Take 400 mg by mouth daily.   EUTHYROX 75 MCG tablet Take 1 tablet by mouth once daily with breakfast   fexofenadine-pseudoephedrine (ALLEGRA-D 24) 180-240 MG 24 hr tablet Take 1 tablet by mouth daily.    Glucosamine-Chondroit-Vit C-Mn (GLUCOSAMINE CHONDR 500 COMPLEX PO) Take 2 tablets by mouth daily.   losartan (COZAAR) 50 MG tablet Take 1 tablet by mouth once daily   Multiple Vitamin (MULTIVITAMIN) tablet Take 1 tablet by mouth daily.   Omega-3 Fatty Acids (FISH OIL) 1000 MG CAPS Take 1,000 mg by mouth daily.   pantoprazole (PROTONIX) 40 MG tablet One tablet 30 minutes before breakfast and one tablet 30 minutes before your evening meal - prn.   Probiotic Product (PROBIOTIC DAILY PO) Take by mouth daily.   sertraline (ZOLOFT) 50 MG tablet TAKE ONE  TABLET  DAILY     Allergies:   Quinapril and Codeine   Social History   Socioeconomic History   Marital status: Married    Spouse name: Not on file   Number of children: 2   Years of education: Not on file   Highest education level: Not on file  Occupational History   Occupation: Retired  Tobacco Use   Smoking status: Never   Smokeless tobacco: Never  Vaping Use   Vaping Use: Never used  Substance and Sexual Activity   Alcohol use: No   Drug use: No   Sexual activity: Yes  Other Topics Concern   Not on file  Social History Narrative   Lives in [place] with husband. Has 2 children boy and girl, 4 grandchildren. Retired Presenter, broadcasting.       Daily Caffeine Use:  NO   Regular Exercise -  Not at this time due to back pain, would like to resume soon   Social Determinants of Health   Financial Resource Strain: Low Risk    Difficulty of Paying Living Expenses: Not hard at all  Food Insecurity: No Food Insecurity   Worried About Charity fundraiser in the Last Year: Never true   Ran Out of Food in the Last Year: Never true  Transportation Needs: No Transportation Needs   Lack of Transportation (Medical): No   Lack of Transportation (Non-Medical): No  Physical Activity: Not on file  Stress: No Stress Concern Present   Feeling of Stress : Not at all  Social Connections: Not on file     Family History: The patient's family history  includes Arthritis in her mother; Breast cancer in her cousin and another family member; Cirrhosis in her mother; Coronary artery disease in her father; Depression in her brother; Diabetes in her maternal aunt; Heart attack in her father; Heart disease in her father; Liver cancer in her cousin and mother; Liver disease in her mother.  ROS:   Please see the history of present illness.     All other systems reviewed and are negative.  EKGs/Labs/Other Studies Reviewed:    The following studies were reviewed today:   EKG:  EKG is  ordered today.  The ekg ordered today demonstrates sinus rhythm, occasional PVCs  Recent Labs: 11/21/2019: TSH 0.85 09/20/2020: ALT 17; BUN 28; Creatinine, Ser 1.52; Hemoglobin 12.4;  Platelets 170.0; Sodium 141 09/25/2020: Potassium 4.6  Recent Lipid Panel    Component Value Date/Time   CHOL 143 09/20/2020 0933   TRIG 153.0 (H) 09/20/2020 0933   HDL 37.30 (L) 09/20/2020 0933   CHOLHDL 4 09/20/2020 0933   VLDL 30.6 09/20/2020 0933   LDLCALC 75 09/20/2020 0933   LDLDIRECT 84.0 07/22/2018 0839     Risk Assessment/Calculations:      Physical Exam:    VS:  BP 126/80 (BP Location: Left Arm, Patient Position: Sitting, Cuff Size: Normal)   Pulse 66   Ht 5\' 4"  (1.626 m)   Wt 149 lb (67.6 kg)   SpO2 99%   BMI 25.58 kg/m     Wt Readings from Last 3 Encounters:  10/14/20 149 lb (67.6 kg)  10/01/20 148 lb 6.4 oz (67.3 kg)  09/23/20 149 lb 3.2 oz (67.7 kg)     GEN:  Well nourished, well developed in no acute distress HEENT: Normal NECK: No JVD; No carotid bruits LYMPHATICS: No lymphadenopathy CARDIAC: RRR, no murmurs, rubs, gallops RESPIRATORY:  Clear to auscultation without rales, wheezing or rhonchi  ABDOMEN: Soft, non-tender, non-distended MUSCULOSKELETAL:  No edema; No deformity  SKIN: Warm and dry NEUROLOGIC:  Alert and oriented x 3 PSYCHIATRIC:  Normal affect   ASSESSMENT:    1. Coronary artery calcification   2. Primary hypertension   3.  Pure hypercholesterolemia     PLAN:    In order of problems listed above:  Patient with chest pain,  coronary artery calcifications on noncontrast chest CT. Previous echocardiogram with normal systolic and diastolic function.  [place]lie 03/2020 with no significant ischemia, low risk.  Continue aspirin, statin  Hypertension, BP controlled. Continue losartan, amlodipine Hyperlipidemia, continue Lipitor.  Follow-up yearly.    Medication Adjustments/Labs and Tests Ordered: Current medicines are reviewed at length with the patient today.  Concerns regarding medicines are outlined above.  Orders Placed This Encounter  Procedures   EKG 12-Lead    No orders of the defined types were placed in this encounter.   Patient Instructions  Medication Instructions:  - Your physician recommends that you continue on your current medications as directed. Please refer to the Current Medication list given to you today.  *If you need a refill on your cardiac medications before your next appointment, please call your pharmacy*   Lab Work: - none ordered  If you have labs (blood work) drawn today and your tests are completely normal, you will receive your results only by: [place] (if you have MyChart) OR A paper copy in the mail If you have any lab test that is abnormal or we need to change your treatment, we will call you to review the results.   Testing/Procedures: - none ordered   Follow-Up: At [facility], you and your health needs are our priority.  As part of our continuing mission to provide you with exceptional heart care, we have created designated Provider Care Teams.  These Care Teams include your primary Cardiologist (physician) and Advanced Practice Providers (APPs -  Physician Assistants and Nurse Practitioners) who all work together to provide you with the care you need, when you need it.  We recommend signing up for the patient portal called "MyChart".  Sign up  information is provided on this After Visit Summary.  MyChart is used to connect with patients for Virtual Visits (Telemedicine).  Patients are able to view lab/test results, encounter notes, upcoming appointments, etc.  Non-urgent messages can be sent to  your provider as well.   To learn more about what you can do with MyChart, go to [email or web address].    Your next appointment:   1 year(s)  The format for your next appointment:   In Person  Provider:   You may see Kate Sable, MD or one of the following Advanced Practice Providers on your designated Care Team:   Murray Hodgkins, NP Christell Faith, PA-C Marrianne Mood, PA-C Cadence [place], [place]   Other Instructions N/a   Signed, Kate Sable, MD  10/14/2020 12:42 PM    [place]

## 2020-10-14 NOTE — Patient Instructions (Signed)
Medication Instructions:  - Your physician recommends that you continue on your current medications as directed. Please refer to the Current Medication list given to you today.  *If you need a refill on your cardiac medications before your next appointment, please call your pharmacy*   Lab Work: - none ordered  If you have labs (blood work) drawn today and your tests are completely normal, you will receive your results only by: [place] (if you have MyChart) OR A paper copy in the mail If you have any lab test that is abnormal or we need to change your treatment, we will call you to review the results.   Testing/Procedures: - none ordered   Follow-Up: At [facility], you and your health needs are our priority.  As part of our continuing mission to provide you with exceptional heart care, we have created designated Provider Care Teams.  These Care Teams include your primary Cardiologist (physician) and Advanced Practice Providers (APPs -  Physician Assistants and Nurse Practitioners) who all work together to provide you with the care you need, when you need it.  We recommend signing up for the patient portal called "MyChart".  Sign up information is provided on this After Visit Summary.  MyChart is used to connect with patients for Virtual Visits (Telemedicine).  Patients are able to view lab/test results, encounter notes, upcoming appointments, etc.  Non-urgent messages can be sent to your provider as well.   To learn more about what you can do with MyChart, go to [email or web address].    Your next appointment:   1 year(s)  The format for your next appointment:   In Person  Provider:   You may see Kate Sable, MD or one of the following Advanced Practice Providers on your designated Care Team:   Murray Hodgkins, NP Christell Faith, PA-C Marrianne Mood, PA-C Cadence Kathlen [place], [place]   Other Instructions N/a

## 2020-10-17 DIAGNOSIS — K219 Gastro-esophageal reflux disease without esophagitis: Secondary | ICD-10-CM | POA: Diagnosis not present

## 2020-10-17 DIAGNOSIS — K582 Mixed irritable bowel syndrome: Secondary | ICD-10-CM | POA: Diagnosis not present

## 2020-10-21 ENCOUNTER — Other Ambulatory Visit (INDEPENDENT_AMBULATORY_CARE_PROVIDER_SITE_OTHER): Payer: PPO

## 2020-10-21 ENCOUNTER — Other Ambulatory Visit: Payer: Self-pay

## 2020-10-21 DIAGNOSIS — R748 Abnormal levels of other serum enzymes: Secondary | ICD-10-CM | POA: Diagnosis not present

## 2020-10-21 LAB — HEPATIC FUNCTION PANEL
ALT: 19 U/L (ref 0–35)
AST: 25 U/L (ref 0–37)
Albumin: 3.7 g/dL (ref 3.5–5.2)
Alkaline Phosphatase: 141 U/L — ABNORMAL HIGH (ref 39–117)
Bilirubin, Direct: 0.1 mg/dL (ref 0.0–0.3)
Total Bilirubin: 0.6 mg/dL (ref 0.2–1.2)
Total Protein: 6.4 g/dL (ref 6.0–8.3)

## 2020-10-22 ENCOUNTER — Other Ambulatory Visit: Payer: Self-pay | Admitting: Internal Medicine

## 2020-10-22 DIAGNOSIS — R748 Abnormal levels of other serum enzymes: Secondary | ICD-10-CM

## 2020-10-22 NOTE — Progress Notes (Signed)
Orders placed for f/u labs.  

## 2020-10-29 ENCOUNTER — Other Ambulatory Visit: Payer: Self-pay | Admitting: Internal Medicine

## 2020-11-06 ENCOUNTER — Other Ambulatory Visit: Payer: Self-pay

## 2020-11-06 ENCOUNTER — Ambulatory Visit
Admission: RE | Admit: 2020-11-06 | Discharge: 2020-11-06 | Disposition: A | Discharge status: Home / Self Care | Payer: PPO | Source: Ambulatory Visit | Attending: Internal Medicine | Admitting: Internal Medicine

## 2020-11-06 DIAGNOSIS — Z1231 Encounter for screening mammogram for malignant neoplasm of breast: Secondary | ICD-10-CM

## 2020-11-07 ENCOUNTER — Other Ambulatory Visit: Payer: Self-pay | Admitting: Internal Medicine

## 2020-11-21 DIAGNOSIS — U071 COVID-19: Secondary | ICD-10-CM | POA: Diagnosis not present

## 2020-11-21 DIAGNOSIS — R519 Headache, unspecified: Secondary | ICD-10-CM | POA: Diagnosis not present

## 2020-11-25 ENCOUNTER — Ambulatory Visit (INDEPENDENT_AMBULATORY_CARE_PROVIDER_SITE_OTHER): Payer: PPO

## 2020-11-25 VITALS — Ht 64.0 in | Wt 149.0 lb

## 2020-11-25 DIAGNOSIS — Z0000 Encounter for general adult medical examination without abnormal findings: Secondary | ICD-10-CM | POA: Diagnosis not present

## 2020-11-25 NOTE — Patient Instructions (Addendum)
  Ms. Loretto , Thank you for taking time to come for your Medicare Wellness Visit. I appreciate your ongoing commitment to your health goals. Please review the following plan we discussed and let me know if I can assist you in the future.   These are the goals we discussed:  Goals       Patient Stated     Maintain Healthy Lifestyle (pt-stated)      Stay active Healthy diet        This is a list of the screening recommended for you and due dates:  Health Maintenance  Topic Date Due   Flu Shot  10/14/2020   COVID-19 Vaccine (3 - Booster for Pfizer series) 10/09/2021*   Zoster (Shingles) Vaccine (1 of 2) 12/24/2021*   Mammogram  11/06/2021   Tetanus Vaccine  03/07/2022   DEXA scan (bone density measurement)  Completed   Hepatitis C Screening: USPSTF Recommendation to screen - Ages 15-79 yo.  Completed   Pneumonia vaccines  Completed   HPV Vaccine  Aged Out  *Topic was postponed. The date shown is not the original due date.

## 2020-11-25 NOTE — Progress Notes (Signed)
Subjective:   Caitlin Lin is a 80 y.o. female who presents for Medicare Annual (Subsequent) preventive examination.  Review of Systems    No ROS.  Medicare Wellness Virtual Visit.  Visual/audio telehealth visit, UTA vital signs.   See social history for additional risk factors.   Cardiac Risk Factors include: advanced age (>1men, >70 women)     Objective:    Today's Vitals   11/25/20 0817  Weight: 149 lb (67.6 kg)  Height: 5\' 4"  (1.626 m)   Body mass index is 25.58 kg/m.  Advanced Directives 11/25/2020 08/09/2020 02/19/2020 11/23/2019 10/27/2019 01/09/2019 01/04/2019  Does Patient Have a Medical Advance Directive? Yes Yes No Yes Yes Yes Yes  Type of Paramedic of [place];Living will [place];Living will - [place];Living will Living will Living will Living will  Does patient want to make changes to medical advance directive? No - Patient declined - - No - Patient declined - - No - Patient declined  Copy of [place] in Chart? No - copy requested - - Yes - validated most recent copy scanned in chart (See row information) - Yes - validated most recent copy scanned in chart (See row information) Yes - validated most recent copy scanned in chart (See row information)    Current Medications (verified) Outpatient Encounter Medications as of 11/25/2020  Medication Sig   amLODipine (NORVASC) 5 MG tablet Take 1 tablet (5 mg total) by mouth daily.   aspirin 81 MG tablet Take 1 tablet (81 mg total) by mouth daily.   atorvastatin (LIPITOR) 20 MG tablet Take 1 tablet by mouth once daily   Cholecalciferol 25 MCG (1000 UT) tablet Take by mouth.   Cranberry 400 MG TABS Take 400 mg by mouth daily.   EUTHYROX 75 MCG tablet Take 1 tablet by mouth once daily with breakfast   fexofenadine-pseudoephedrine (ALLEGRA-D 24) 180-240 MG 24 hr tablet Take 1 tablet by mouth daily.   Glucosamine-Chondroit-Vit C-Mn  (GLUCOSAMINE CHONDR 500 COMPLEX PO) Take 2 tablets by mouth daily.   losartan (COZAAR) 50 MG tablet Take 1 tablet by mouth once daily   metoCLOPramide (REGLAN) 5 MG tablet Take 1 tablet (5 mg total) by mouth 2 (two) times daily. (Patient not taking: No sig reported)   Multiple Vitamin (MULTIVITAMIN) tablet Take 1 tablet by mouth daily.   Omega-3 Fatty Acids (FISH OIL) 1000 MG CAPS Take 1,000 mg by mouth daily.   pantoprazole (PROTONIX) 40 MG tablet One tablet 30 minutes before breakfast and one tablet 30 minutes before your evening meal - prn.   Probiotic Product (PROBIOTIC DAILY PO) Take by mouth daily.   sertraline (ZOLOFT) 50 MG tablet Take 1 tablet by mouth once daily   No facility-administered encounter medications on file as of 11/25/2020.    Allergies (verified) Quinapril and Codeine   History: Past Medical History:  Diagnosis Date   Cataracts, bilateral    Chronic kidney disease    Followed by Dr. Holley Raring   Chronic kidney disease    Colon polyp    Constipation due to slow transit    Diarrhea    Diarrhea    GERD (gastroesophageal reflux disease)    Heart murmur    History of cyst of breast    History of hiatal hernia    Hyperlipidemia    Hypertension    Hypothyroidism    Melanoma of lower leg ([place]) 2013   Shingles 2013   Shingles  Spinal stenosis in cervical region    Dr. Sharlet Salina   Subdural hematoma [facility])    Thyroid disease    Past Surgical History:  Procedure Laterality Date   ABDOMINAL HYSTERECTOMY  1974   ANTERIOR AND POSTERIOR VAGINAL REPAIR     ANTERIOR AND POSTERIOR VAGINAL REPAIR W/ SACROSPINOUS LIGAMENT SUSPENSION Right    BREAST BIOPSY  1963   Benign per pt's report   COLONOSCOPY     ELECTROMAGNETIC NAVIGATION BROCHOSCOPY Right 01/09/2019   Procedure: ELECTROMAGNETIC NAVIGATION BRONCHOSCOPY;  Surgeon: Tyler Pita, MD;  Location: [facility] ORS;  Service: Cardiopulmonary;  Laterality: Right;   ESOPHAGOGASTRODUODENOSCOPY      ESOPHAGOGASTRODUODENOSCOPY (EGD) WITH PROPOFOL N/A 11/10/2016   Procedure: ESOPHAGOGASTRODUODENOSCOPY (EGD) WITH PROPOFOL;  Surgeon: Lollie Sails, MD;  Location: [facility] ENDOSCOPY;  Service: Endoscopy;  Laterality: N/A;   ESOPHAGOGASTRODUODENOSCOPY (EGD) WITH PROPOFOL N/A 09/09/2018   Procedure: ESOPHAGOGASTRODUODENOSCOPY (EGD) WITH PROPOFOL;  Surgeon: Lollie Sails, MD;  Location: [facility] ENDOSCOPY;  Service: Endoscopy;  Laterality: N/A;   ESOPHAGOGASTRODUODENOSCOPY (EGD) WITH PROPOFOL N/A 10/27/2019   Procedure: ESOPHAGOGASTRODUODENOSCOPY (EGD) WITH PROPOFOL;  Surgeon: Robert Bellow, MD;  Location: [facility] ENDOSCOPY;  Service: Endoscopy;  Laterality: N/A;   ESOPHAGOGASTRODUODENOSCOPY (EGD) WITH PROPOFOL N/A 08/09/2020   Procedure: ESOPHAGOGASTRODUODENOSCOPY (EGD) WITH PROPOFOL;  Surgeon: Lesly Rubenstein, MD;  Location: [facility] ENDOSCOPY;  Service: Endoscopy;  Laterality: N/A;   LUNG BIOPSY  2007   Benign per pt's report   LUNG BIOPSY Right    OOPHORECTOMY  1970   PERINEOPLASTY  12/17/2013   sling for stress incontinence  12/18/2011   Family History  Problem Relation Age of Onset   Arthritis Mother    Liver disease Mother    Cirrhosis Mother    Liver cancer Mother    Heart disease Father    Heart attack Father    Coronary artery disease Father    Depression Brother    Breast cancer Other    Breast cancer Cousin        maternal cousins   Liver cancer Cousin    Diabetes Maternal Aunt    Social History   Socioeconomic History   Marital status: Married    Spouse name: Not on file   Number of children: 2   Years of education: Not on file   Highest education level: Not on file  Occupational History   Occupation: Retired  Tobacco Use   Smoking status: Never   Smokeless tobacco: Never  Vaping Use   Vaping Use: Never used  Substance and Sexual Activity   Alcohol use: No   Drug use: No   Sexual activity: Yes  Other Topics Concern   Not on file  Social History Narrative    Lives in [place] with husband. Has 2 children boy and girl, 4 grandchildren. Retired Presenter, broadcasting.       Daily Caffeine Use:  NO   Regular Exercise -  Not at this time due to back pain, would like to resume soon   Social Determinants of Health   Financial Resource Strain: Low Risk    Difficulty of Paying Living Expenses: Not hard at all  Food Insecurity: No Food Insecurity   Worried About Charity fundraiser in the Last Year: Never true   Ran Out of Food in the Last Year: Never true  Transportation Needs: No Transportation Needs   Lack of Transportation (Medical): No   Lack of Transportation (Non-Medical): No  Physical Activity: Not on file  Stress: No Stress Concern  Present   Feeling of Stress : Not at all  Social Connections: Socially Integrated   Frequency of Communication with Friends and Family: Three times a week   Frequency of Social Gatherings with Friends and Family: Once a week   Attends Religious Services: More than 4 times per year   Active Member of [employer] or Organizations: Yes   Attends Music therapist: More than 4 times per year   Marital Status: Married    Tobacco Counseling Counseling given: Not Answered   Clinical Intake:  Pre-visit preparation completed: Yes        Diabetes: No  How often do you need to have someone help you when you read instructions, pamphlets, or other written materials from your doctor or pharmacy?: 1 - Never Interpreter Needed?: No      Activities of Daily Living In your present state of health, do you have any difficulty performing the following activities: 11/25/2020  Hearing? N  Vision? N  Difficulty concentrating or making decisions? N  Walking or climbing stairs? N  Dressing or bathing? N  Doing errands, shopping? N  Preparing Food and eating ? N  Using the Toilet? N  In the past six months, have you accidently leaked urine? N  Do you have problems with loss of bowel control? N  Managing your  Medications? N  Managing your Finances? N  Housekeeping or managing your Housekeeping? N  Some recent data might be hidden    Patient Care Team: Einar Pheasant, MD as PCP - General (Internal Medicine) Kate Sable, MD as PCP - Cardiology (Cardiology) Anthonette Legato, MD (Internal Medicine) Ok Edwards, NP as Nurse Practitioner (Gastroenterology)  Indicate any recent Medical Services you may have received from other than [facility] providers in the past year (date may be approximate).     Assessment:   This is a routine wellness examination for [place].  I connected with Leiana today by telephone and verified that I am speaking with the correct person using two identifiers. Location patient: home Location provider: work Persons participating in the virtual visit: patient, Marine scientist.    I discussed the limitations, risks, security and privacy concerns of performing an evaluation and management service by telephone and the availability of in person appointments. The patient expressed understanding and verbally consented to this telephonic visit.    Interactive audio and video telecommunications were attempted between this provider and patient, however failed, due to patient having technical difficulties OR patient did not have access to video capability.  We continued and completed visit with audio only.  Some vital signs may be absent or patient reported.   Hearing/Vision screen Hearing Screening - Comments:: Patient is able to hear conversational tones without difficulty.  No issues reported.  Vision Screening - Comments:: They have seen their ophthalmologist in the last 12 months.    Dietary issues and exercise activities discussed: Current Exercise Habits: Home exercise routine, Type of exercise: walking, Intensity: Mild Regular diet Good water intake   Goals Addressed               This Visit's Progress     Patient Stated     Maintain Healthy Lifestyle  (pt-stated)        Stay active Healthy diet       Depression Screen PHQ 2/9 Scores 11/25/2020 09/23/2020 11/23/2019 11/22/2018 09/17/2017 06/03/2017 02/10/2017  PHQ - 2 Score 0 0 0 0 0 0 0  PHQ- 9 Score - - - - - 4 -  Fall Risk Fall Risk  11/25/2020 09/23/2020 11/23/2019 01/13/2019 12/26/2018  Falls in the past year? 0 0 0 0 0  Number falls in past yr: 0 0 0 0 -  Injury with Fall? 0 0 - 0 -  Comment - - - - -  Risk for fall due to : - - - - -  Risk for fall due to: Comment - - - - -  Follow up Falls evaluation completed Falls evaluation completed Falls evaluation completed - -    FALL RISK PREVENTION PERTAINING TO THE HOME: Adequate lighting in your home to reduce risk of falls? Yes   ASSISTIVE DEVICES UTILIZED TO PREVENT FALLS: Life alert? No  Use of a cane, walker or w/c? No  TIMED UP AND GO: Was the test performed? No.   Cognitive Function: MMSE - Mini Mental State Exam 09/24/2014 09/24/2014  Orientation to time 5 5  Orientation to Place 5 -  Registration 3 3  Attention/ Calculation 5 -  Recall 3 3  Language- name 2 objects 2 -  Language- repeat 1 1  Language- follow 3 step command 3 -  Language- read & follow direction 1 1  Write a sentence 1 -  Copy design 1 1  Total score 30 -     6CIT Screen 11/25/2020 11/23/2019 11/22/2018 09/17/2017  What Year? 0 points 0 points 0 points 0 points  What month? 0 points 0 points 0 points 0 points  What time? 0 points 0 points 0 points 0 points  Count back from 20 0 points - 0 points 0 points  Months in reverse 0 points - 0 points 0 points  Repeat phrase 0 points - 0 points 0 points  Total Score 0 - 0 0    Immunizations Immunization History  Administered Date(s) Administered   Fluad Quad(high Dose 65+) 12/22/2019   Hepatitis A 07/14/2017, 08/17/2017   Hepatitis B 07/14/2017, 08/17/2017   Influenza Split 12/15/2010, 03/07/2012   Influenza, High Dose Seasonal PF 12/21/2016, 11/17/2017, 12/26/2018   Influenza,inj,Quad PF,6+ Mos  12/05/2013   Influenza-Unspecified 12/25/2014, 01/03/2016, 12/28/2016   PFIZER(Purple Top)SARS-COV-2 Vaccination 05/29/2019, 06/19/2019   Pneumococcal Conjugate-13 03/12/2014   Pneumococcal Polysaccharide-23 12/09/2005, 03/03/2013   Tdap 03/07/2012   Zoster, Live 12/09/2009    Flu Vaccine status: Due, Education has been provided regarding the importance of this vaccine. Advised may receive this vaccine at local pharmacy or Health Dept. Aware to provide a copy of the vaccination record if obtained from local pharmacy or Health Dept. Verbalized acceptance and understanding. Deferred.   Health Maintenance Health Maintenance  Topic Date Due   INFLUENZA VACCINE  10/14/2020   COVID-19 Vaccine (3 - Booster for [place] series) 10/09/2021 (Originally 11/19/2019)   Zoster Vaccines- Shingrix (1 of 2) 12/24/2021 (Originally 04/21/1991)   MAMMOGRAM  11/06/2021   TETANUS/TDAP  03/07/2022   DEXA SCAN  Completed   Hepatitis C Screening  Completed   PNA vac Low Risk Adult  Completed   HPV VACCINES  Aged Out   Lung Cancer Screening: (Low Dose CT Chest recommended if Age 5-80 years, 30 pack-year currently smoking OR have quit w/in 15years.) does not qualify.   Hepatitis C Screening: does not qualify  Vision Screening: Recommended annual ophthalmology exams for early detection of glaucoma and other disorders of the eye. Is the patient up to date with their annual eye exam?  Yes   Dental Screening: Recommended annual dental exams for proper oral hygiene  Community Resource Referral / Chronic Care Management:  CRR required this visit?  No   CCM required this visit?  No      Plan:   Keep all routine maintenance appointments.   I have personally reviewed and noted the following in the patient's chart:   Medical and social history Use of alcohol, tobacco or illicit drugs  Current medications and supplements including opioid prescriptions. Not taking opioid.  Functional ability and  status Nutritional status Physical activity Advanced directives List of other physicians Hospitalizations, surgeries, and ER visits in previous 12 months Vitals Screenings to include cognitive, depression, and falls Referrals and appointments  In addition, I have reviewed and discussed with patient certain preventive protocols, quality metrics, and best practice recommendations. A written personalized care plan for preventive services as well as general preventive health recommendations were provided to patient via mychart.     Varney Biles, LPN   03/31/5788

## 2020-11-27 ENCOUNTER — Emergency Department: Payer: PPO

## 2020-11-27 ENCOUNTER — Telehealth: Payer: Self-pay | Admitting: Internal Medicine

## 2020-11-27 ENCOUNTER — Emergency Department
Admission: EM | Admit: 2020-11-27 | Discharge: 2020-11-27 | Disposition: A | Discharge status: Home / Self Care | Payer: PPO | Attending: Emergency Medicine | Admitting: Emergency Medicine

## 2020-11-27 ENCOUNTER — Encounter: Payer: Self-pay | Admitting: Emergency Medicine

## 2020-11-27 ENCOUNTER — Other Ambulatory Visit: Payer: Self-pay

## 2020-11-27 DIAGNOSIS — I129 Hypertensive chronic kidney disease with stage 1 through stage 4 chronic kidney disease, or unspecified chronic kidney disease: Secondary | ICD-10-CM | POA: Insufficient documentation

## 2020-11-27 DIAGNOSIS — Z79899 Other long term (current) drug therapy: Secondary | ICD-10-CM | POA: Insufficient documentation

## 2020-11-27 DIAGNOSIS — U071 COVID-19: Secondary | ICD-10-CM | POA: Insufficient documentation

## 2020-11-27 DIAGNOSIS — R0602 Shortness of breath: Secondary | ICD-10-CM | POA: Diagnosis not present

## 2020-11-27 DIAGNOSIS — R5383 Other fatigue: Secondary | ICD-10-CM

## 2020-11-27 DIAGNOSIS — Z7982 Long term (current) use of aspirin: Secondary | ICD-10-CM | POA: Diagnosis not present

## 2020-11-27 DIAGNOSIS — E039 Hypothyroidism, unspecified: Secondary | ICD-10-CM | POA: Diagnosis not present

## 2020-11-27 DIAGNOSIS — N184 Chronic kidney disease, stage 4 (severe): Secondary | ICD-10-CM | POA: Diagnosis not present

## 2020-11-27 DIAGNOSIS — E860 Dehydration: Secondary | ICD-10-CM | POA: Diagnosis not present

## 2020-11-27 DIAGNOSIS — R5381 Other malaise: Secondary | ICD-10-CM

## 2020-11-27 LAB — BASIC METABOLIC PANEL
Anion gap: 9 (ref 5–15)
BUN: 36 mg/dL — ABNORMAL HIGH (ref 8–23)
CO2: 22 mmol/L (ref 22–32)
Calcium: 9.1 mg/dL (ref 8.9–10.3)
Chloride: 105 mmol/L (ref 98–111)
Creatinine, Ser: 1.59 mg/dL — ABNORMAL HIGH (ref 0.44–1.00)
GFR, Estimated: 33 mL/min — ABNORMAL LOW (ref >60)
Glucose, Bld: 105 mg/dL — ABNORMAL HIGH (ref 70–99)
Potassium: 4.3 mmol/L (ref 3.5–5.1)
Sodium: 136 mmol/L (ref 135–145)

## 2020-11-27 LAB — CBC
HCT: 40.4 % (ref 36.0–46.0)
Hemoglobin: 14.2 g/dL (ref 12.0–15.0)
MCH: 30 pg (ref 26.0–34.0)
MCHC: 35.1 g/dL (ref 30.0–36.0)
MCV: 85.4 fL (ref 80.0–100.0)
Platelets: 141 10*3/uL — ABNORMAL LOW (ref 150–400)
RBC: 4.73 MIL/uL (ref 3.87–5.11)
RDW: 14 % (ref 11.5–15.5)
WBC: 6.9 10*3/uL (ref 4.0–10.5)
nRBC: 0 % (ref 0.0–0.2)

## 2020-11-27 MED ORDER — NAPROXEN 250 MG PO TABS: 250.0000 mg | ORAL_TABLET | Freq: Two times a day (BID) | ORAL | 0 refills | Status: DC

## 2020-11-27 MED ORDER — KETOROLAC TROMETHAMINE 30 MG/ML IJ SOLN
15.0000 mg | INTRAMUSCULAR | Status: AC
  Administered 2020-11-27: 15 mg via INTRAVENOUS
  Filled 2020-11-27: qty 1

## 2020-11-27 MED ORDER — FAMOTIDINE 20 MG PO TABS: 20.0000 mg | ORAL_TABLET | Freq: Two times a day (BID) | ORAL | 0 refills | Status: DC

## 2020-11-27 MED ORDER — DEXTROSE 5 % AND 0.9 % NACL IV BOLUS
1000.0000 mL | Freq: Once | INTRAVENOUS | Status: AC
  Administered 2020-11-27: 1000 mL via INTRAVENOUS
  Filled 2020-11-27: qty 1000

## 2020-11-27 MED ORDER — PANTOPRAZOLE SODIUM 40 MG IV SOLR
40.0000 mg | Freq: Once | INTRAVENOUS | Status: AC
  Administered 2020-11-27: 40 mg via INTRAVENOUS
  Filled 2020-11-27: qty 40

## 2020-11-27 NOTE — ED Triage Notes (Signed)
Pt via POV from home. Pt c/o SOB, cough, weakness, and fatigue. Pt states that she was COVID positive on 9/6 and went to the walk in clinic on 9/7 and they prescribed her the antiviral, prednisone, and cough medicine. Pt states that is feeling worse than she did. Pt is A&OX4 and NAD.

## 2020-11-27 NOTE — Telephone Encounter (Signed)
Patient was instructed to go to ED. By Access nurse. Patient did comply and is currently being evaluated.

## 2020-11-27 NOTE — ED Provider Notes (Signed)
[facility] Emergency Department Provider Note  ____________________________________________  Time seen: Approximately 3:26 PM  I have reviewed the triage vital signs and the nursing notes.   HISTORY  Chief Complaint Shortness of Breath and Covid Positive    HPI Caitlin Lin is a 80 y.o. female with a history of CKD, GERD, hypertension who comes the ED complaining of shortness of breath, nonproductive cough, fatigue, malaise, decreased oral intake for the past 8 days.  She was diagnosed with COVID at that time, went to her doctor who started her on Monday.  We are.  She also took a course of cough medicine and prednisone.  She was initially feeling little bit better, but now states that she started to feel worse.  Denies vomiting or diarrhea.  No dizziness or syncope.    Past Medical History:  Diagnosis Date   Cataracts, bilateral    Chronic kidney disease    Followed by Dr. Holley Raring   Chronic kidney disease    Colon polyp    Constipation due to slow transit    Diarrhea    Diarrhea    GERD (gastroesophageal reflux disease)    Heart murmur    History of cyst of breast    History of hiatal hernia    Hyperlipidemia    Hypertension    Hypothyroidism    Melanoma of lower leg ([place]) 2013   Shingles 2013   Shingles    Spinal stenosis in cervical region    Dr. Sharlet Salina   Subdural hematoma [facility])    Thyroid disease      Patient Active Problem List   Diagnosis Date Noted   Elevated alkaline phosphatase level 09/30/2020   Hyperkalemia 09/23/2020   Stress 05/18/2020   Fatigue 03/30/2020   Leg swelling 03/30/2020   Dysphagia 08/09/2019   Sleep difficulties 11/23/2018   Numbness of toes 11/23/2018   Pelvic pressure in female 08/28/2018   Hyperglycemia 11/20/2017   Aortic atherosclerosis ([place]) 06/16/2017   Low back pain 06/06/2017   Abdominal pain 06/03/2017   Fatty liver 02/13/2017   Aortic regurgitation 07/01/2016   Health care maintenance  05/12/2016   Chest pain 05/12/2016   Angular cheilitis 03/27/2015   TMJ arthralgia 09/25/2014   GERD (gastroesophageal reflux disease) 12/05/2013   Osteopenia 10/30/2013   Hypercholesterolemia 03/04/2013   Medicare annual wellness visit, subsequent 09/05/2012   Anemia, iron deficiency 06/08/2012   Screening for breast cancer 08/03/2011   Rectal prolapse 08/03/2011   Hypertension 02/09/2011   CKD (chronic kidney disease) stage 4, GFR 15-29 ml/min ([place]) 02/09/2011   Hypothyroidism 02/09/2011     Past Surgical History:  Procedure Laterality Date   ABDOMINAL HYSTERECTOMY  1974   ANTERIOR AND POSTERIOR VAGINAL REPAIR     ANTERIOR AND POSTERIOR VAGINAL REPAIR W/ SACROSPINOUS LIGAMENT SUSPENSION Right    BREAST BIOPSY  1963   Benign per pt's report   COLONOSCOPY     ELECTROMAGNETIC NAVIGATION BROCHOSCOPY Right 01/09/2019   Procedure: ELECTROMAGNETIC NAVIGATION BRONCHOSCOPY;  Surgeon: Tyler Pita, MD;  Location: [facility] ORS;  Service: Cardiopulmonary;  Laterality: Right;   ESOPHAGOGASTRODUODENOSCOPY     ESOPHAGOGASTRODUODENOSCOPY (EGD) WITH PROPOFOL N/A 11/10/2016   Procedure: ESOPHAGOGASTRODUODENOSCOPY (EGD) WITH PROPOFOL;  Surgeon: Lollie Sails, MD;  Location: [facility] ENDOSCOPY;  Service: Endoscopy;  Laterality: N/A;   ESOPHAGOGASTRODUODENOSCOPY (EGD) WITH PROPOFOL N/A 09/09/2018   Procedure: ESOPHAGOGASTRODUODENOSCOPY (EGD) WITH PROPOFOL;  Surgeon: Lollie Sails, MD;  Location: [facility] ENDOSCOPY;  Service: Endoscopy;  Laterality: N/A;   ESOPHAGOGASTRODUODENOSCOPY (EGD)  WITH PROPOFOL N/A 10/27/2019   Procedure: ESOPHAGOGASTRODUODENOSCOPY (EGD) WITH PROPOFOL;  Surgeon: Robert Bellow, MD;  Location: [facility] ENDOSCOPY;  Service: Endoscopy;  Laterality: N/A;   ESOPHAGOGASTRODUODENOSCOPY (EGD) WITH PROPOFOL N/A 08/09/2020   Procedure: ESOPHAGOGASTRODUODENOSCOPY (EGD) WITH PROPOFOL;  Surgeon: Lesly Rubenstein, MD;  Location: [facility] ENDOSCOPY;  Service: Endoscopy;  Laterality: N/A;    LUNG BIOPSY  2007   Benign per pt's report   LUNG BIOPSY Right    OOPHORECTOMY  1970   PERINEOPLASTY  12/17/2013   sling for stress incontinence  12/18/2011     Prior to Admission medications   Medication Sig Start Date End Date Taking? Authorizing Provider  famotidine (PEPCID) 20 MG tablet Take 1 tablet (20 mg total) by mouth 2 (two) times daily. 11/27/20  Yes Carrie Mew, MD  naproxen (NAPROSYN) 250 MG tablet Take 1 tablet (250 mg total) by mouth 2 (two) times daily with a meal. 11/27/20  Yes Carrie Mew, MD  amLODipine (NORVASC) 5 MG tablet Take 1 tablet (5 mg total) by mouth daily. 08/29/20   Einar Pheasant, MD  aspirin 81 MG tablet Take 1 tablet (81 mg total) by mouth daily. 10/15/15   Jackolyn Confer, MD  atorvastatin (LIPITOR) 20 MG tablet Take 1 tablet by mouth once daily 09/09/20   Einar Pheasant, MD  Cholecalciferol 25 MCG (1000 UT) tablet Take by mouth.    [provider]  Cranberry 400 MG TABS Take 400 mg by mouth daily.    [provider]  EUTHYROX 75 MCG tablet Take 1 tablet by mouth once daily with breakfast 09/17/20   Einar Pheasant, MD  fexofenadine-pseudoephedrine (ALLEGRA-D 24) 180-240 MG 24 hr tablet Take 1 tablet by mouth daily.    [provider]  Glucosamine-Chondroit-Vit C-Mn (GLUCOSAMINE CHONDR 500 COMPLEX PO) Take 2 tablets by mouth daily.    [provider]  losartan (COZAAR) 50 MG tablet Take 1 tablet by mouth once daily 10/30/20   Einar Pheasant, MD  metoCLOPramide (REGLAN) 5 MG tablet Take 1 tablet (5 mg total) by mouth 2 (two) times daily. Patient not taking: No sig reported 02/19/20   Carrie Mew, MD  Multiple Vitamin (MULTIVITAMIN) tablet Take 1 tablet by mouth daily.    [provider]  Omega-3 Fatty Acids (FISH OIL) 1000 MG CAPS Take 1,000 mg by mouth daily.    [provider]  pantoprazole (PROTONIX) 40 MG tablet One tablet 30 minutes before breakfast and one tablet 30 minutes before  your evening meal - prn. 12/22/19   Einar Pheasant, MD  Probiotic Product (PROBIOTIC DAILY PO) Take by mouth daily.    [provider]  sertraline (ZOLOFT) 50 MG tablet Take 1 tablet by mouth once daily 11/07/20   Einar Pheasant, MD     Allergies Quinapril and Codeine   Family History  Problem Relation Age of Onset   Arthritis Mother    Liver disease Mother    Cirrhosis Mother    Liver cancer Mother    Heart disease Father    Heart attack Father    Coronary artery disease Father    Depression Brother    Breast cancer Other    Breast cancer Cousin        maternal cousins   Liver cancer Cousin    Diabetes Maternal Aunt     Social History Social History   Tobacco Use   Smoking status: Never   Smokeless tobacco: Never  Vaping Use   Vaping Use: Never used  Substance  Use Topics   Alcohol use: No   Drug use: No    Review of Systems  Constitutional:   No fever or chills.  ENT:   No sore throat. No rhinorrhea. Cardiovascular:   No chest pain or syncope. Respiratory: Positive shortness of breath and nonproductive cough. Gastrointestinal:   Negative for abdominal pain, vomiting and diarrhea.  Musculoskeletal:   Negative for focal pain or swelling All other systems reviewed and are negative except as documented above in ROS and HPI.  ____________________________________________   PHYSICAL EXAM:  VITAL SIGNS: ED Triage Vitals  Enc Vitals Group     BP 11/27/20 1209 (!) 156/86     Pulse Rate 11/27/20 1209 68     Resp 11/27/20 1209 (!) 25     Temp 11/27/20 1209 97.7 F (36.5 C)     Temp Source 11/27/20 1209 Oral     SpO2 11/27/20 1209 99 %     Weight 11/27/20 1210 150 lb (68 kg)     Height 11/27/20 1210 5\' 4"  (1.626 m)     Head Circumference --      Peak Flow --      Pain Score 11/27/20 1210 0     Pain Loc --      Pain Edu? --      Excl. in [place]? --     Vital signs reviewed, nursing assessments reviewed.   Constitutional:   Alert and oriented.  Non-toxic appearance. Eyes:   Conjunctivae are normal. EOMI. PERRL. ENT      Head:   Normocephalic and atraumatic.      Nose:   Normal      Mouth/Throat:   Dry mucous membranes      Neck:   No meningismus. Full ROM. Hematological/Lymphatic/Immunilogical:   No cervical lymphadenopathy. Cardiovascular:   RRR. Symmetric bilateral radial and DP pulses.  No murmurs. Cap refill less than 2 seconds. Respiratory:   Normal respiratory effort without tachypnea/retractions. Breath sounds are clear and equal bilaterally. No wheezes/rales/rhonchi. Gastrointestinal:   Soft and nontender. Non distended. There is no CVA tenderness.  No rebound, rigidity, or guarding. Genitourinary:   deferred Musculoskeletal:   Normal range of motion in all extremities. No joint effusions.  No lower extremity tenderness.  No edema. Neurologic:   Normal speech and language.  Motor grossly intact. No acute focal neurologic deficits are appreciated.  Skin:    Skin is warm, dry and intact. No rash noted.  No petechiae, purpura, or bullae.  ____________________________________________    LABS (pertinent positives/negatives) (all labs ordered are listed, but only abnormal results are displayed) Labs Reviewed  CBC - Abnormal; Notable for the following components:      Result Value   Platelets 141 (*)    All other components within normal limits  BASIC METABOLIC PANEL - Abnormal; Notable for the following components:   Glucose, Bld 105 (*)    BUN 36 (*)    Creatinine, Ser 1.59 (*)    GFR, Estimated 33 (*)    All other components within normal limits   ____________________________________________   EKG    ____________________________________________    RADIOLOGY  DG Chest 2 View  Result Date: 11/27/2020 CLINICAL DATA:  Shortness of breath EXAM: CHEST - 2 VIEW COMPARISON:  02/18/2020 FINDINGS: The heart size and mediastinal contours are within normal limits. Both lungs are clear. Disc degenerative disease of  the thoracic spine. IMPRESSION: No acute abnormality of the lungs. Electronically Signed   By: Dorna Bloom.D.  On: 11/27/2020 12:54    ____________________________________________   PROCEDURES Procedures  ____________________________________________  DIFFERENTIAL DIAGNOSIS   Pneumonia, dehydration, electrolyte abnormality, pleural effusion, viral syndrome  CLINICAL IMPRESSION / ASSESSMENT AND PLAN / ED COURSE  Medications ordered in the ED: Medications  dextrose 5 % and 0.9% NaCl 5-0.9 % bolus 1,000 mL (1,000 mLs Intravenous New Bag/Given 11/27/20 1416)  ketorolac (TORADOL) 30 MG/ML injection 15 mg (15 mg Intravenous Given 11/27/20 1412)  pantoprazole (PROTONIX) injection 40 mg (40 mg Intravenous Given 11/27/20 1415)    Pertinent labs & imaging results that were available during my care of the patient were reviewed by me and considered in my medical decision making (see chart for details).  Caitlin Lin was evaluated in Emergency Department on 11/27/2020 for the symptoms described in the history of present illness. She was evaluated in the context of the global COVID-19 pandemic, which necessitated consideration that the patient might be at risk for infection with the SARS-CoV-2 virus that causes COVID-19. Institutional protocols and algorithms that pertain to the evaluation of patients at risk for COVID-19 are in a state of rapid change based on information released by regulatory bodies including the CDC and federal and state organizations. These policies and algorithms were followed during the patient's care in the ED.   Patient presents with malaise and fatigue, decreased appetite in the setting of recent COVID diagnosis.  This appears to be typical post-COVID syndrome.  Vital signs are normal, on my exam not tachypneic, not tachycardic, normal oxygenation.  Considering the patient's symptoms, medical history, and physical examination today, I have low suspicion for ACS, PE, TAD,  pneumothorax, carditis, mediastinitis, pneumonia, CHF, or sepsis.  Patient given IV fluids, Toradol and Protonix and feels much better.  She ambulated to the bathroom without difficulty or dizziness.  Stable for discharge on continued supportive care at home.     ____________________________________________   FINAL CLINICAL IMPRESSION(S) / ED DIAGNOSES    Final diagnoses:  COVID-19 virus infection  Malaise and fatigue     ED Discharge Orders          Ordered    famotidine (PEPCID) 20 MG tablet  2 times daily        11/27/20 1526    naproxen (NAPROSYN) 250 MG tablet  2 times daily with meals        11/27/20 1526            Portions of this note were generated with dragon dictation software. Dictation errors may occur despite best attempts at proofreading.    Carrie Mew, MD 11/27/20 1529

## 2020-11-27 NOTE — Telephone Encounter (Signed)
Patient informed, Due to the high volume of calls and your symptoms we have to forward your call to our Triage Nurse to expedient your call. Please hold for the transfer.  Patient transferred to Access Nurse. Due to having COVID for the past week and she has finished her antibiotic but she is still feeling weak.No openings in office or virtual.

## 2020-12-06 ENCOUNTER — Other Ambulatory Visit: Payer: Self-pay

## 2020-12-06 ENCOUNTER — Other Ambulatory Visit (INDEPENDENT_AMBULATORY_CARE_PROVIDER_SITE_OTHER): Payer: PPO

## 2020-12-06 DIAGNOSIS — R748 Abnormal levels of other serum enzymes: Secondary | ICD-10-CM

## 2020-12-06 LAB — ALT: ALT: 20 U/L (ref 0–35)

## 2020-12-06 LAB — GAMMA GT: GGT: 15 U/L (ref 7–51)

## 2020-12-06 LAB — AST: AST: 24 U/L (ref 0–37)

## 2020-12-12 ENCOUNTER — Telehealth: Payer: Self-pay | Admitting: Internal Medicine

## 2020-12-12 NOTE — Telephone Encounter (Signed)
FYI . Placed call to pt. Pt had Covid for two weeks. Pt states she was feeling better but woke up yesterday morning and felt very weak. Pt is feeling drainage in her throat. Pt confirms no other symptoms such as shortness of breath or chest pain. Pt has agreed to do a e-visit through [employer]. Will F/U after.

## 2020-12-12 NOTE — Telephone Encounter (Signed)
If persistent symptoms, agree with need for evaluation.  Please confirm able to get appt.

## 2020-12-12 NOTE — Telephone Encounter (Signed)
Patient calling in and states she had COVID 11/20/20 and was sick for 3 weeks. States she had started to feel better. She was seen by walk in clinic and took antivirals, prednisone, nose spray, and cough, medication. Medications were finished 11/27/20. Finished the over the counter Mucinex for drainage last Friday.   Patient states yesterday she started feeling fatigue, drainage in the throat and nose,and cough. Patient states she feels like she is getting Covid all over again.   Patient wanting to know what she should do?

## 2020-12-13 ENCOUNTER — Other Ambulatory Visit: Payer: Self-pay | Admitting: Internal Medicine

## 2020-12-13 LAB — ALKALINE PHOSPHATASE, ISOENZYMES
Alkaline Phosphatase: 133 IU/L — ABNORMAL HIGH (ref 44–121)
BONE FRACTION: 37 % (ref 14–68)
INTESTINAL FRAC.: 4 % (ref 0–18)
LIVER FRACTION: 59 % (ref 18–85)

## 2020-12-13 NOTE — Telephone Encounter (Signed)
LMTCB

## 2020-12-13 NOTE — Telephone Encounter (Signed)
Placed call to pt to F/U. Pt states that she does not use Mychart and did not do a visit yesterday. She states she is still feeling bad and her cough has came back.

## 2020-12-13 NOTE — Telephone Encounter (Signed)
With history of covid and persistent cough, needs to be evaluated.  Recommend in person evaluation - may need cxr, etc.

## 2020-12-18 NOTE — Telephone Encounter (Signed)
Called patient to follow up. She is having good days and bad days. Stated that she is not really coughing. Her main complaint is the lingering fatigue/weakness. Confirmed no other acute symptoms. She is eating and drinking. Has f/u appt on 10/21. Advised if her symptoms change or worsen then she will need to go to acute care to be evaluated sooner. Patient gave verbal understanding.

## 2021-01-03 ENCOUNTER — Ambulatory Visit (INDEPENDENT_AMBULATORY_CARE_PROVIDER_SITE_OTHER): Payer: PPO | Admitting: Internal Medicine

## 2021-01-03 ENCOUNTER — Other Ambulatory Visit: Payer: Self-pay

## 2021-01-03 VITALS — BP 136/70 | HR 83 | Temp 97.0°F | Resp 16 | Ht 64.0 in | Wt 150.0 lb

## 2021-01-03 DIAGNOSIS — Z0000 Encounter for general adult medical examination without abnormal findings: Secondary | ICD-10-CM

## 2021-01-03 DIAGNOSIS — K760 Fatty (change of) liver, not elsewhere classified: Secondary | ICD-10-CM | POA: Diagnosis not present

## 2021-01-03 DIAGNOSIS — K219 Gastro-esophageal reflux disease without esophagitis: Secondary | ICD-10-CM

## 2021-01-03 DIAGNOSIS — E7800 Pure hypercholesterolemia, unspecified: Secondary | ICD-10-CM

## 2021-01-03 DIAGNOSIS — E039 Hypothyroidism, unspecified: Secondary | ICD-10-CM

## 2021-01-03 DIAGNOSIS — D696 Thrombocytopenia, unspecified: Secondary | ICD-10-CM | POA: Diagnosis not present

## 2021-01-03 DIAGNOSIS — R739 Hyperglycemia, unspecified: Secondary | ICD-10-CM | POA: Diagnosis not present

## 2021-01-03 DIAGNOSIS — F439 Reaction to severe stress, unspecified: Secondary | ICD-10-CM

## 2021-01-03 DIAGNOSIS — R5383 Other fatigue: Secondary | ICD-10-CM

## 2021-01-03 DIAGNOSIS — R6889 Other general symptoms and signs: Secondary | ICD-10-CM

## 2021-01-03 DIAGNOSIS — I10 Essential (primary) hypertension: Secondary | ICD-10-CM | POA: Diagnosis not present

## 2021-01-03 DIAGNOSIS — I700 Atherosclerosis of aorta: Secondary | ICD-10-CM | POA: Diagnosis not present

## 2021-01-03 DIAGNOSIS — N184 Chronic kidney disease, stage 4 (severe): Secondary | ICD-10-CM | POA: Diagnosis not present

## 2021-01-03 DIAGNOSIS — Z23 Encounter for immunization: Secondary | ICD-10-CM

## 2021-01-03 LAB — TSH: TSH: 0.28 u[IU]/mL — ABNORMAL LOW (ref 0.35–5.50)

## 2021-01-03 LAB — HEPATIC FUNCTION PANEL
ALT: 19 U/L (ref 0–35)
AST: 29 U/L (ref 0–37)
Albumin: 4 g/dL (ref 3.5–5.2)
Alkaline Phosphatase: 136 U/L — ABNORMAL HIGH (ref 39–117)
Bilirubin, Direct: 0.1 mg/dL (ref 0.0–0.3)
Total Bilirubin: 0.7 mg/dL (ref 0.2–1.2)
Total Protein: 6.7 g/dL (ref 6.0–8.3)

## 2021-01-03 LAB — BASIC METABOLIC PANEL
BUN: 22 mg/dL (ref 6–23)
CO2: 27 mEq/L (ref 19–32)
Calcium: 9.7 mg/dL (ref 8.4–10.5)
Chloride: 108 mEq/L (ref 96–112)
Creatinine, Ser: 1.46 mg/dL — ABNORMAL HIGH (ref 0.40–1.20)
GFR: 33.98 mL/min — ABNORMAL LOW (ref >60.00)
Glucose, Bld: 94 mg/dL (ref 70–99)
Potassium: 5.2 mEq/L — ABNORMAL HIGH (ref 3.5–5.1)
Sodium: 142 mEq/L (ref 135–145)

## 2021-01-03 LAB — LIPID PANEL
Cholesterol: 155 mg/dL (ref 0–200)
HDL: 33.8 mg/dL — ABNORMAL LOW (ref >39.00)
NonHDL: 121.6
Total CHOL/HDL Ratio: 5
Triglycerides: 237 mg/dL — ABNORMAL HIGH (ref 0.0–149.0)
VLDL: 47.4 mg/dL — ABNORMAL HIGH (ref 0.0–40.0)

## 2021-01-03 LAB — PLATELET COUNT: Platelets: 184 10*3/uL (ref 140–400)

## 2021-01-03 LAB — LDL CHOLESTEROL, DIRECT: Direct LDL: 73 mg/dL

## 2021-01-03 LAB — HEMOGLOBIN A1C: Hgb A1c MFr Bld: 5.6 % (ref 4.6–6.5)

## 2021-01-03 NOTE — Assessment & Plan Note (Signed)
Physical today 01/03/21.  Mammogram 11/08/20 - Birads I.  Colonoscopy 06/2011 - recommended f/u in 2023.

## 2021-01-03 NOTE — Progress Notes (Signed)
Patient ID: Caitlin Lin, female   DOB: [birth year], 80 y.o.   MRN: [id number]   Subjective:    Patient ID: Caitlin Lin, female    DOB: 1940-08-05, 80 y.o.   MRN: [id number]  This visit occurred during the SARS-CoV-2 public health emergency.  Safety protocols were in place, including screening questions prior to the visit, additional usage of staff PPE, and extensive cleaning of exam room while observing appropriate contact time as indicated for disinfecting solutions.   Patient here for her physical exam.   Chief Complaint  Patient presents with   Annual Exam   .   HPI Diagnosed with covid 11/2020.   Prescribed molnupiravir and prednisone. Initially felt better. Was seen in ER 11/27/20, due to worsening symptoms of decreased appetite, malaise and fatigue.  Was given IVFs, toradol and protonix.  CXR clear. Felt better and was discharged home.  She is continuing to feel better.  Still noticing some thick saliva - throat.  Has to keep swallowing.  Some dripping - nose.  Minimal cough in am.  No chest pain or increased sob reported.  Eating.  No abdominal pain. No bowel change reported.  Previously had diarrhea, but this has resolved.  Weakness is better.  Still some issues with balance.  Discussed PT.  She declines at this time.     Past Medical History:  Diagnosis Date   Cataracts, bilateral    Chronic kidney disease    Followed by Dr. Holley Raring   Chronic kidney disease    Colon polyp    Constipation due to slow transit    Diarrhea    Diarrhea    GERD (gastroesophageal reflux disease)    Heart murmur    History of cyst of breast    History of hiatal hernia    Hyperlipidemia    Hypertension    Hypothyroidism    Melanoma of lower leg ([place]) 2013   Shingles 2013   Shingles    Spinal stenosis in cervical region    Dr. Sharlet Salina   Subdural hematoma    Thyroid disease    Past Surgical History:  Procedure Laterality Date   ABDOMINAL HYSTERECTOMY  1974   ANTERIOR AND POSTERIOR  VAGINAL REPAIR     ANTERIOR AND POSTERIOR VAGINAL REPAIR W/ SACROSPINOUS LIGAMENT SUSPENSION Right    BREAST BIOPSY  1963   Benign per pt's report   COLONOSCOPY     ELECTROMAGNETIC NAVIGATION BROCHOSCOPY Right 01/09/2019   Procedure: ELECTROMAGNETIC NAVIGATION BRONCHOSCOPY;  Surgeon: Tyler Pita, MD;  Location: [facility] ORS;  Service: Cardiopulmonary;  Laterality: Right;   ESOPHAGOGASTRODUODENOSCOPY     ESOPHAGOGASTRODUODENOSCOPY (EGD) WITH PROPOFOL N/A 11/10/2016   Procedure: ESOPHAGOGASTRODUODENOSCOPY (EGD) WITH PROPOFOL;  Surgeon: Lollie Sails, MD;  Location: [facility] ENDOSCOPY;  Service: Endoscopy;  Laterality: N/A;   ESOPHAGOGASTRODUODENOSCOPY (EGD) WITH PROPOFOL N/A 09/09/2018   Procedure: ESOPHAGOGASTRODUODENOSCOPY (EGD) WITH PROPOFOL;  Surgeon: Lollie Sails, MD;  Location: [facility] ENDOSCOPY;  Service: Endoscopy;  Laterality: N/A;   ESOPHAGOGASTRODUODENOSCOPY (EGD) WITH PROPOFOL N/A 10/27/2019   Procedure: ESOPHAGOGASTRODUODENOSCOPY (EGD) WITH PROPOFOL;  Surgeon: Robert Bellow, MD;  Location: [facility] ENDOSCOPY;  Service: Endoscopy;  Laterality: N/A;   ESOPHAGOGASTRODUODENOSCOPY (EGD) WITH PROPOFOL N/A 08/09/2020   Procedure: ESOPHAGOGASTRODUODENOSCOPY (EGD) WITH PROPOFOL;  Surgeon: Lesly Rubenstein, MD;  Location: [facility] ENDOSCOPY;  Service: Endoscopy;  Laterality: N/A;   LUNG BIOPSY  2007   Benign per pt's report   LUNG BIOPSY Right    OOPHORECTOMY  1970   PERINEOPLASTY  12/17/2013  sling for stress incontinence  12/18/2011   Family History  Problem Relation Age of Onset   Arthritis Mother    Liver disease Mother    Cirrhosis Mother    Liver cancer Mother    Heart disease Father    Heart attack Father    Coronary artery disease Father    Depression Brother    Breast cancer Other    Breast cancer Cousin        maternal cousins   Liver cancer Cousin    Diabetes Maternal Aunt    Social History   Socioeconomic History   Marital status: Married    Spouse name:  Not on file   Number of children: 2   Years of education: Not on file   Highest education level: Not on file  Occupational History   Occupation: Retired  Tobacco Use   Smoking status: Never   Smokeless tobacco: Never  Vaping Use   Vaping Use: Never used  Substance and Sexual Activity   Alcohol use: No   Drug use: No   Sexual activity: Yes  Other Topics Concern   Not on file  Social History Narrative   Lives in [place] with husband. Has 2 children boy and girl, 4 grandchildren. Retired Presenter, broadcasting.       Daily Caffeine Use:  NO   Regular Exercise -  Not at this time due to back pain, would like to resume soon   Social Determinants of Health   Financial Resource Strain: Low Risk    Difficulty of Paying Living Expenses: Not hard at all  Food Insecurity: No Food Insecurity   Worried About Charity fundraiser in the Last Year: Never true   Ran Out of Food in the Last Year: Never true  Transportation Needs: No Transportation Needs   Lack of Transportation (Medical): No   Lack of Transportation (Non-Medical): No  Physical Activity: Not on file  Stress: No Stress Concern Present   Feeling of Stress : Not at all  Social Connections: Socially Integrated   Frequency of Communication with Friends and Family: Three times a week   Frequency of Social Gatherings with Friends and Family: Once a week   Attends Religious Services: More than 4 times per year   Active Member of [employer] or Organizations: Yes   Attends Music therapist: More than 4 times per year   Marital Status: Married     Review of Systems  Constitutional:  Positive for fatigue. Negative for appetite change and unexpected weight change.  HENT:  Positive for congestion and postnasal drip. Negative for sinus pressure and sore throat.   Eyes:  Negative for pain and visual disturbance.  Respiratory:  Negative for cough, chest tightness and shortness of breath.   Cardiovascular:  Negative for chest pain,  palpitations and leg swelling.  Gastrointestinal:  Negative for abdominal pain, diarrhea, nausea and vomiting.  Genitourinary:  Negative for difficulty urinating and dysuria.  Musculoskeletal:  Negative for joint swelling and myalgias.  Skin:  Negative for color change and rash.  Neurological:  Negative for dizziness, light-headedness and headaches.  Hematological:  Negative for adenopathy. Does not bruise/bleed easily.  Psychiatric/Behavioral:  Negative for agitation and dysphoric mood.       Objective:     BP 136/70   Pulse 83   Temp (!) 97 F (36.1 C)   Resp 16   Ht '5\' 4"'  (1.626 m)   Wt 150 lb (68 kg)   SpO2 98%  BMI 25.75 kg/m  Wt Readings from Last 3 Encounters:  01/03/21 150 lb (68 kg)  11/27/20 150 lb (68 kg)  11/25/20 149 lb (67.6 kg)    Physical Exam Vitals reviewed.  Constitutional:      General: She is not in acute distress.    Appearance: Normal appearance. She is well-developed.  HENT:     Head: Normocephalic and atraumatic.     Right Ear: External ear normal.     Left Ear: External ear normal.  Eyes:     General: No scleral icterus.       Right eye: No discharge.        Left eye: No discharge.     Conjunctiva/sclera: Conjunctivae normal.  Neck:     Thyroid: No thyromegaly.  Cardiovascular:     Rate and Rhythm: Normal rate and regular rhythm.  Pulmonary:     Effort: No tachypnea, accessory muscle usage or respiratory distress.     Breath sounds: Normal breath sounds. No decreased breath sounds or wheezing.  Chest:  Breasts:    Right: No inverted nipple, mass, nipple discharge or tenderness (no axillary adenopathy).     Left: No inverted nipple, mass, nipple discharge or tenderness (no axilarry adenopathy).  Abdominal:     General: Bowel sounds are normal.     Palpations: Abdomen is soft.     Tenderness: There is no abdominal tenderness.  Musculoskeletal:        General: No swelling or tenderness.     Cervical back: Neck supple.   Lymphadenopathy:     Cervical: No cervical adenopathy.  Skin:    Findings: No erythema or rash.  Neurological:     Mental Status: She is alert and oriented to person, place, and time.  Psychiatric:        Mood and Affect: Mood normal.        Behavior: Behavior normal.     Outpatient Encounter Medications as of 01/03/2021  Medication Sig   amLODipine (NORVASC) 5 MG tablet Take 1 tablet (5 mg total) by mouth daily.   aspirin 81 MG tablet Take 1 tablet (81 mg total) by mouth daily.   atorvastatin (LIPITOR) 20 MG tablet Take 1 tablet by mouth once daily   Cholecalciferol 25 MCG (1000 UT) tablet Take by mouth.   Cranberry 400 MG TABS Take 400 mg by mouth daily.   EUTHYROX 75 MCG tablet Take 1 tablet by mouth once daily with breakfast   fexofenadine-pseudoephedrine (ALLEGRA-D 24) 180-240 MG 24 hr tablet Take 1 tablet by mouth daily.   Glucosamine-Chondroit-Vit C-Mn (GLUCOSAMINE CHONDR 500 COMPLEX PO) Take 2 tablets by mouth daily.   losartan (COZAAR) 50 MG tablet Take 1 tablet by mouth once daily   Multiple Vitamin (MULTIVITAMIN) tablet Take 1 tablet by mouth daily.   Omega-3 Fatty Acids (FISH OIL) 1000 MG CAPS Take 1,000 mg by mouth daily.   pantoprazole (PROTONIX) 40 MG tablet One tablet 30 minutes before breakfast and one tablet 30 minutes before your evening meal - prn.   Probiotic Product (PROBIOTIC DAILY PO) Take by mouth daily.   sertraline (ZOLOFT) 50 MG tablet Take 1 tablet by mouth once daily   [DISCONTINUED] famotidine (PEPCID) 20 MG tablet Take 1 tablet (20 mg total) by mouth 2 (two) times daily.   [DISCONTINUED] metoCLOPramide (REGLAN) 5 MG tablet Take 1 tablet (5 mg total) by mouth 2 (two) times daily. (Patient not taking: No sig reported)   [DISCONTINUED] naproxen (NAPROSYN) 250 MG tablet Take 1  tablet (250 mg total) by mouth 2 (two) times daily with a meal.   No facility-administered encounter medications on file as of 01/03/2021.     Lab Results  Component Value Date    WBC 6.9 11/27/2020   HGB 14.2 11/27/2020   HCT 40.4 11/27/2020   PLT 184 01/03/2021   GLUCOSE 94 01/03/2021   CHOL 155 01/03/2021   TRIG 237.0 (H) 01/03/2021   HDL 33.80 (L) 01/03/2021   LDLDIRECT 73.0 01/03/2021   LDLCALC 75 09/20/2020   ALT 19 01/03/2021   AST 29 01/03/2021   NA 142 01/03/2021   K 5.2 No hemolysis seen (H) 01/03/2021   CL 108 01/03/2021   CREATININE 1.46 (H) 01/03/2021   BUN 22 01/03/2021   CO2 27 01/03/2021   TSH 0.28 (L) 01/03/2021   HGBA1C 5.6 01/03/2021   MICROALBUR 2.1 (H) 03/27/2015    DG Chest 2 View  Result Date: 11/27/2020 CLINICAL DATA:  Shortness of breath EXAM: CHEST - 2 VIEW COMPARISON:  02/18/2020 FINDINGS: The heart size and mediastinal contours are within normal limits. Both lungs are clear. Disc degenerative disease of the thoracic spine. IMPRESSION: No acute abnormality of the lungs. Electronically Signed   By: Eddie Candle M.D.   On: 11/27/2020 12:54       Assessment & Plan:   Problem List Items Addressed This Visit     Aortic atherosclerosis ([place])    Continue lipitor.       CKD (chronic kidney disease) stage 4, GFR 15-29 ml/min (HCC)    Followed by nephrology.  Last evaluated by Dr Percell Miller 05/16/20.  Stable.  Recommended f/u in one year.  GFR 32.       Fatigue    Recent covid infection.  Weakness is improving.  Wants to continue to monitor.  Notify me if persistent problems.       Fatty liver    Diet and exercise.  Follow liver panel.       GERD (gastroesophageal reflux disease)    Recent EGD ok.  Continue protonix.       Health care maintenance    Physical today 01/03/21.  Mammogram 11/08/20 - Birads I.  Colonoscopy 06/2011 - recommended f/u in 2023.        Hypercholesterolemia    Continue lipitor.  Low cholesterol diet and exercise.  Follow lipid panel and liver function tests.        Relevant Orders   Hepatic function panel (Completed)   Lipid panel (Completed)   TSH (Completed)   Hyperglycemia    Low carb diet  and exercise.  Follow met b and a1c.        Relevant Orders   Hemoglobin A1c (Completed)   Stress    On zoloft and doing well on this medication.  Continue zoloft.  Follow.       Throat congestion    Recent covid.  Symptoms have improved.  Persistent thick saliva in her throat.  Discussed taking mucinex and using saline nasal spray and nasacort nasal spray.  Follow.  Notify me if symptoms do not resolve.  Recent cxr - lungs clear.  No increased acid reflux reported.        Thrombocytopenia (HCC)    Check cbc.  Recent platelet count slightly decreased.       Relevant Orders   Platelet count (Completed)   Hypertension (Chronic)    Blood pressure as outlined. Continue amlodipine and losartan.  Follow pressures.  Follow metabolic panel.  Relevant Orders   Basic metabolic panel (Completed)   Hypothyroidism (Chronic)    On thyroid replacement.  Follow tsh.       Other Visit Diagnoses     Routine general medical examination at a health care facility    -  Primary   Need for immunization against influenza       Relevant Orders   Flu Vaccine QUAD High Dose(Fluad) (Completed)        Einar Pheasant, MD

## 2021-01-05 ENCOUNTER — Encounter: Payer: Self-pay | Admitting: Internal Medicine

## 2021-01-05 ENCOUNTER — Other Ambulatory Visit: Payer: Self-pay | Admitting: Internal Medicine

## 2021-01-05 DIAGNOSIS — R6889 Other general symptoms and signs: Secondary | ICD-10-CM | POA: Insufficient documentation

## 2021-01-05 DIAGNOSIS — N184 Chronic kidney disease, stage 4 (severe): Secondary | ICD-10-CM

## 2021-01-05 DIAGNOSIS — R748 Abnormal levels of other serum enzymes: Secondary | ICD-10-CM

## 2021-01-05 NOTE — Assessment & Plan Note (Signed)
Followed by nephrology.  Last evaluated by Dr Percell Miller 05/16/20.  Stable.  Recommended f/u in one year.  GFR 32.

## 2021-01-05 NOTE — Assessment & Plan Note (Signed)
On thyroid replacement.  Follow tsh.  

## 2021-01-05 NOTE — Assessment & Plan Note (Signed)
Blood pressure as outlined. Continue amlodipine and losartan.  Follow pressures.  Follow metabolic panel.

## 2021-01-05 NOTE — Assessment & Plan Note (Signed)
On zoloft and doing well on this medication.  Continue zoloft.  Follow.

## 2021-01-05 NOTE — Progress Notes (Signed)
Order placed for f/u labs.  

## 2021-01-05 NOTE — Assessment & Plan Note (Signed)
Diet and exercise.  Follow liver panel.

## 2021-01-05 NOTE — Assessment & Plan Note (Signed)
Recent covid infection.  Weakness is improving.  Wants to continue to monitor.  Notify me if persistent problems.

## 2021-01-05 NOTE — Assessment & Plan Note (Signed)
Check cbc.  Recent platelet count slightly decreased.

## 2021-01-05 NOTE — Assessment & Plan Note (Signed)
Recent EGD ok.  Continue protonix.

## 2021-01-05 NOTE — Assessment & Plan Note (Signed)
Low carb diet and exercise.  Follow met b and a1c.   

## 2021-01-05 NOTE — Assessment & Plan Note (Signed)
Continue lipitor.  Low cholesterol diet and exercise.  Follow lipid panel and liver function tests.

## 2021-01-05 NOTE — Assessment & Plan Note (Signed)
Continue lipitor  ?

## 2021-01-05 NOTE — Assessment & Plan Note (Signed)
Recent covid.  Symptoms have improved.  Persistent thick saliva in her throat.  Discussed taking mucinex and using saline nasal spray and nasacort nasal spray.  Follow.  Notify me if symptoms do not resolve.  Recent cxr - lungs clear.  No increased acid reflux reported.

## 2021-01-09 ENCOUNTER — Other Ambulatory Visit (INDEPENDENT_AMBULATORY_CARE_PROVIDER_SITE_OTHER): Payer: PPO

## 2021-01-09 ENCOUNTER — Other Ambulatory Visit: Payer: Self-pay

## 2021-01-09 DIAGNOSIS — N184 Chronic kidney disease, stage 4 (severe): Secondary | ICD-10-CM

## 2021-01-09 LAB — POTASSIUM: Potassium: 4.6 mEq/L (ref 3.5–5.1)

## 2021-01-09 MED ORDER — LEVOTHYROXINE SODIUM 50 MCG PO TABS: 50.0000 ug | ORAL_TABLET | Freq: Every day | ORAL | 1 refills | Status: DC

## 2021-01-09 NOTE — Addendum Note (Signed)
Addended by: Leeanne Rio on: 01/09/2021 02:57 PM   Modules accepted: Orders

## 2021-01-09 NOTE — Addendum Note (Signed)
Addended by: Elpidio Galea T on: 01/09/2021 09:55 AM   Modules accepted: Orders

## 2021-01-10 ENCOUNTER — Telehealth: Payer: Self-pay | Admitting: Internal Medicine

## 2021-01-10 NOTE — Telephone Encounter (Signed)
Patient's pharmacy would like to know if it's ok to change the manufacture on her levothyroxine (EUTHYROX) 50 MCG tablet.

## 2021-01-10 NOTE — Telephone Encounter (Signed)
Pharmacy has been notified.

## 2021-01-28 ENCOUNTER — Other Ambulatory Visit: Payer: Self-pay | Admitting: Internal Medicine

## 2021-02-20 ENCOUNTER — Other Ambulatory Visit (INDEPENDENT_AMBULATORY_CARE_PROVIDER_SITE_OTHER): Payer: PPO

## 2021-02-20 ENCOUNTER — Other Ambulatory Visit: Payer: Self-pay

## 2021-02-20 DIAGNOSIS — R748 Abnormal levels of other serum enzymes: Secondary | ICD-10-CM | POA: Diagnosis not present

## 2021-02-20 DIAGNOSIS — N184 Chronic kidney disease, stage 4 (severe): Secondary | ICD-10-CM | POA: Diagnosis not present

## 2021-02-20 LAB — VITAMIN D 25 HYDROXY (VIT D DEFICIENCY, FRACTURES): VITD: 90.01 ng/mL (ref 30.00–100.00)

## 2021-02-20 LAB — GAMMA GT: GGT: 12 U/L (ref 7–51)

## 2021-02-20 NOTE — Addendum Note (Signed)
Addended by: Leeanne Rio on: 02/20/2021 09:03 AM   Modules accepted: Orders

## 2021-02-22 ENCOUNTER — Other Ambulatory Visit: Payer: Self-pay | Admitting: Internal Medicine

## 2021-02-22 LAB — PARATHYROID HORMONE, INTACT (NO CA): PTH: 27 pg/mL (ref 15–65)

## 2021-02-25 LAB — ALKALINE PHOSPHATASE, ISOENZYMES
Alkaline Phosphatase: 128 IU/L — ABNORMAL HIGH (ref 44–121)
BONE FRACTION: 48 % (ref 14–68)
INTESTINAL FRAC.: 2 % (ref 0–18)
LIVER FRACTION: 50 % (ref 18–85)

## 2021-03-11 ENCOUNTER — Other Ambulatory Visit: Payer: Self-pay | Admitting: Internal Medicine

## 2021-04-11 DIAGNOSIS — J019 Acute sinusitis, unspecified: Secondary | ICD-10-CM | POA: Diagnosis not present

## 2021-04-11 DIAGNOSIS — R051 Acute cough: Secondary | ICD-10-CM | POA: Diagnosis not present

## 2021-04-11 DIAGNOSIS — B9689 Other specified bacterial agents as the cause of diseases classified elsewhere: Secondary | ICD-10-CM | POA: Diagnosis not present

## 2021-04-11 DIAGNOSIS — R0981 Nasal congestion: Secondary | ICD-10-CM | POA: Diagnosis not present

## 2021-04-11 DIAGNOSIS — J3489 Other specified disorders of nose and nasal sinuses: Secondary | ICD-10-CM | POA: Diagnosis not present

## 2021-05-02 ENCOUNTER — Other Ambulatory Visit: Payer: Self-pay | Admitting: Internal Medicine

## 2021-05-06 ENCOUNTER — Other Ambulatory Visit: Payer: Self-pay

## 2021-05-06 ENCOUNTER — Ambulatory Visit (INDEPENDENT_AMBULATORY_CARE_PROVIDER_SITE_OTHER): Payer: PPO | Admitting: Internal Medicine

## 2021-05-06 VITALS — BP 124/72 | HR 75 | Temp 97.9°F | Resp 16 | Ht 64.0 in | Wt 153.8 lb

## 2021-05-06 DIAGNOSIS — I10 Essential (primary) hypertension: Secondary | ICD-10-CM

## 2021-05-06 DIAGNOSIS — R739 Hyperglycemia, unspecified: Secondary | ICD-10-CM

## 2021-05-06 DIAGNOSIS — K219 Gastro-esophageal reflux disease without esophagitis: Secondary | ICD-10-CM | POA: Diagnosis not present

## 2021-05-06 DIAGNOSIS — E7800 Pure hypercholesterolemia, unspecified: Secondary | ICD-10-CM | POA: Diagnosis not present

## 2021-05-06 DIAGNOSIS — N184 Chronic kidney disease, stage 4 (severe): Secondary | ICD-10-CM | POA: Diagnosis not present

## 2021-05-06 DIAGNOSIS — I700 Atherosclerosis of aorta: Secondary | ICD-10-CM | POA: Diagnosis not present

## 2021-05-06 DIAGNOSIS — F439 Reaction to severe stress, unspecified: Secondary | ICD-10-CM | POA: Diagnosis not present

## 2021-05-06 DIAGNOSIS — D696 Thrombocytopenia, unspecified: Secondary | ICD-10-CM

## 2021-05-06 DIAGNOSIS — R748 Abnormal levels of other serum enzymes: Secondary | ICD-10-CM | POA: Diagnosis not present

## 2021-05-06 DIAGNOSIS — K760 Fatty (change of) liver, not elsewhere classified: Secondary | ICD-10-CM

## 2021-05-06 DIAGNOSIS — E039 Hypothyroidism, unspecified: Secondary | ICD-10-CM

## 2021-05-06 DIAGNOSIS — J3489 Other specified disorders of nose and nasal sinuses: Secondary | ICD-10-CM

## 2021-05-06 DIAGNOSIS — D509 Iron deficiency anemia, unspecified: Secondary | ICD-10-CM

## 2021-05-06 LAB — HEPATIC FUNCTION PANEL
ALT: 16 U/L (ref 0–35)
AST: 27 U/L (ref 0–37)
Albumin: 4.1 g/dL (ref 3.5–5.2)
Alkaline Phosphatase: 102 U/L (ref 39–117)
Bilirubin, Direct: 0.1 mg/dL (ref 0.0–0.3)
Total Bilirubin: 0.7 mg/dL (ref 0.2–1.2)
Total Protein: 6.8 g/dL (ref 6.0–8.3)

## 2021-05-06 LAB — LDL CHOLESTEROL, DIRECT: Direct LDL: 90 mg/dL

## 2021-05-06 LAB — BASIC METABOLIC PANEL
BUN: 24 mg/dL — ABNORMAL HIGH (ref 6–23)
CO2: 28 mEq/L (ref 19–32)
Calcium: 9.3 mg/dL (ref 8.4–10.5)
Chloride: 107 mEq/L (ref 96–112)
Creatinine, Ser: 1.62 mg/dL — ABNORMAL HIGH (ref 0.40–1.20)
GFR: 29.92 mL/min — ABNORMAL LOW (ref >60.00)
Glucose, Bld: 92 mg/dL (ref 70–99)
Potassium: 4.6 mEq/L (ref 3.5–5.1)
Sodium: 141 mEq/L (ref 135–145)

## 2021-05-06 LAB — LIPID PANEL
Cholesterol: 173 mg/dL (ref 0–200)
HDL: 40.7 mg/dL (ref >39.00)
NonHDL: 132.62
Total CHOL/HDL Ratio: 4
Triglycerides: 220 mg/dL — ABNORMAL HIGH (ref 0.0–149.0)
VLDL: 44 mg/dL — ABNORMAL HIGH (ref 0.0–40.0)

## 2021-05-06 LAB — HEMOGLOBIN A1C: Hgb A1c MFr Bld: 5.8 % (ref 4.6–6.5)

## 2021-05-06 LAB — TSH: TSH: 24.44 u[IU]/mL — ABNORMAL HIGH (ref 0.35–5.50)

## 2021-05-06 NOTE — Patient Instructions (Signed)
Continue allegra  Start nasacort nasal spray - 2 sprays each nostril one time per day.    Pepcid 20mg  - take one tablet 30 minutes before your evening meal.

## 2021-05-06 NOTE — Progress Notes (Signed)
Patient ID: Caitlin Lin, female   DOB: February 24, 1941, 81 y.o.   MRN: [id number]   Subjective:    Patient ID: Caitlin Lin, female    DOB: October 15, 1940, 81 y.o.   MRN: [id number]  This visit occurred during the SARS-CoV-2 public health emergency.  Safety protocols were in place, including screening questions prior to the visit, additional usage of staff PPE, and extensive cleaning of exam room while observing appropriate contact time as indicated for disinfecting solutions.   Patient here for a scheduled follow up.   Chief Complaint  Patient presents with   Hypertension   Hyperlipidemia   Hypothyroidism   Gastroesophageal Reflux   .   Hypertension Pertinent negatives include no chest pain, headaches, palpitations or shortness of breath.  Hyperlipidemia Pertinent negatives include no chest pain, myalgias or shortness of breath.  Gastroesophageal Reflux She reports no abdominal pain, no chest pain, no coughing or no nausea.  Still with persistent drainage and dripping of her nose.  Started allegra this past week.  No sore throat.  No sinus pressure.  No chest congestion or sob.  No chest pain.  Some acid reflux.  Takes protonix q am.  Caitlin Lin to watch what she eats.  No abdominal pain.  Bowels moving.  Fatigue is better.     Past Medical History:  Diagnosis Date   Cataracts, bilateral    Chronic kidney disease    Followed by Dr. Holley Raring   Chronic kidney disease    Colon polyp    Constipation due to slow transit    Diarrhea    Diarrhea    GERD (gastroesophageal reflux disease)    Heart murmur    History of cyst of breast    History of hiatal hernia    Hyperlipidemia    Hypertension    Hypothyroidism    Melanoma of lower leg ([place]) 2013   Shingles 2013   Shingles    Spinal stenosis in cervical region    Dr. Sharlet Salina   Subdural hematoma    Thyroid disease    Past Surgical History:  Procedure Laterality Date   ABDOMINAL HYSTERECTOMY  1974   ANTERIOR AND POSTERIOR VAGINAL  REPAIR     ANTERIOR AND POSTERIOR VAGINAL REPAIR W/ SACROSPINOUS LIGAMENT SUSPENSION Right    BREAST BIOPSY  1963   Benign per pt's report   COLONOSCOPY     ELECTROMAGNETIC NAVIGATION BROCHOSCOPY Right 01/09/2019   Procedure: ELECTROMAGNETIC NAVIGATION BRONCHOSCOPY;  Surgeon: Tyler Pita, MD;  Location: [facility] ORS;  Service: Cardiopulmonary;  Laterality: Right;   ESOPHAGOGASTRODUODENOSCOPY     ESOPHAGOGASTRODUODENOSCOPY (EGD) WITH PROPOFOL N/A 11/10/2016   Procedure: ESOPHAGOGASTRODUODENOSCOPY (EGD) WITH PROPOFOL;  Surgeon: Lollie Sails, MD;  Location: [facility] ENDOSCOPY;  Service: Endoscopy;  Laterality: N/A;   ESOPHAGOGASTRODUODENOSCOPY (EGD) WITH PROPOFOL N/A 09/09/2018   Procedure: ESOPHAGOGASTRODUODENOSCOPY (EGD) WITH PROPOFOL;  Surgeon: Lollie Sails, MD;  Location: [facility] ENDOSCOPY;  Service: Endoscopy;  Laterality: N/A;   ESOPHAGOGASTRODUODENOSCOPY (EGD) WITH PROPOFOL N/A 10/27/2019   Procedure: ESOPHAGOGASTRODUODENOSCOPY (EGD) WITH PROPOFOL;  Surgeon: Robert Bellow, MD;  Location: [facility] ENDOSCOPY;  Service: Endoscopy;  Laterality: N/A;   ESOPHAGOGASTRODUODENOSCOPY (EGD) WITH PROPOFOL N/A 08/09/2020   Procedure: ESOPHAGOGASTRODUODENOSCOPY (EGD) WITH PROPOFOL;  Surgeon: Lesly Rubenstein, MD;  Location: [facility] ENDOSCOPY;  Service: Endoscopy;  Laterality: N/A;   LUNG BIOPSY  2007   Benign per pt's report   LUNG BIOPSY Right    OOPHORECTOMY  1970   PERINEOPLASTY  12/17/2013   sling for stress incontinence  12/18/2011   Family History  Problem Relation Age of Onset   Arthritis Mother    Liver disease Mother    Cirrhosis Mother    Liver cancer Mother    Heart disease Father    Heart attack Father    Coronary artery disease Father    Depression Brother    Breast cancer Other    Breast cancer Cousin        maternal cousins   Liver cancer Cousin    Diabetes Maternal Aunt    Social History   Socioeconomic History   Marital status: Married    Spouse name: Not on  file   Number of children: 2   Years of education: Not on file   Highest education level: Not on file  Occupational History   Occupation: Retired  Tobacco Use   Smoking status: Never   Smokeless tobacco: Never  Vaping Use   Vaping Use: Never used  Substance and Sexual Activity   Alcohol use: No   Drug use: No   Sexual activity: Yes  Other Topics Concern   Not on file  Social History Narrative   Lives in [place] with husband. Has 2 children boy and girl, 4 grandchildren. Retired Presenter, broadcasting.       Daily Caffeine Use:  NO   Regular Exercise -  Not at this time due to back pain, would like to resume soon   Social Determinants of Health   Financial Resource Strain: Low Risk    Difficulty of Paying Living Expenses: Not hard at all  Food Insecurity: No Food Insecurity   Worried About Charity fundraiser in the Last Year: Never true   Ran Out of Food in the Last Year: Never true  Transportation Needs: No Transportation Needs   Lack of Transportation (Medical): No   Lack of Transportation (Non-Medical): No  Physical Activity: Not on file  Stress: No Stress Concern Present   Feeling of Stress : Not at all  Social Connections: Socially Integrated   Frequency of Communication with Friends and Family: Three times a week   Frequency of Social Gatherings with Friends and Family: Once a week   Attends Religious Services: More than 4 times per year   Active Member of [employer] or Organizations: Yes   Attends Music therapist: More than 4 times per year   Marital Status: Married     Review of Systems  Constitutional:  Negative for appetite change and unexpected weight change.  HENT:  Positive for congestion and postnasal drip. Negative for sinus pressure.   Respiratory:  Negative for cough, chest tightness and shortness of breath.   Cardiovascular:  Negative for chest pain, palpitations and leg swelling.  Gastrointestinal:  Negative for abdominal pain, diarrhea, nausea and  vomiting.       Acid reflux.   Genitourinary:  Negative for difficulty urinating and dysuria.  Musculoskeletal:  Negative for joint swelling and myalgias.  Skin:  Negative for color change and rash.  Neurological:  Negative for dizziness, light-headedness and headaches.  Psychiatric/Behavioral:  Negative for agitation and dysphoric mood.       Objective:     BP 124/72    Pulse 75    Temp 97.9 F (36.6 C)    Resp 16    Ht _0  (1.626 m)    Wt 153 lb 12.8 oz (69.8 kg)    SpO2 98%    BMI 26.40 kg/m  Wt Readings from Last 3 Encounters:  05/06/21 153 lb 12.8 oz (69.8 kg)  01/03/21 150 lb (68 kg)  11/27/20 150 lb (68 kg)    Physical Exam Vitals reviewed.  Constitutional:      General: She is not in acute distress.    Appearance: Normal appearance.  HENT:     Head: Normocephalic and atraumatic.     Right Ear: External ear normal.     Left Ear: External ear normal.  Eyes:     General: No scleral icterus.       Right eye: No discharge.        Left eye: No discharge.     Conjunctiva/sclera: Conjunctivae normal.  Neck:     Thyroid: No thyromegaly.  Cardiovascular:     Rate and Rhythm: Normal rate and regular rhythm.  Pulmonary:     Effort: No respiratory distress.     Breath sounds: Normal breath sounds. No wheezing.  Abdominal:     General: Bowel sounds are normal.     Palpations: Abdomen is soft.     Tenderness: There is no abdominal tenderness.  Musculoskeletal:        General: No swelling or tenderness.     Cervical back: Neck supple. No tenderness.  Lymphadenopathy:     Cervical: No cervical adenopathy.  Skin:    Findings: No erythema or rash.  Neurological:     Mental Status: She is alert.  Psychiatric:        Mood and Affect: Mood normal.        Behavior: Behavior normal.     Outpatient Encounter Medications as of 05/06/2021  Medication Sig   Alpha-Lipoic Acid 200 MG TABS Take 200 mg by mouth 3 (three) times daily.   amLODipine (NORVASC) 5 MG tablet Take 1  tablet by mouth once daily   aspirin 81 MG tablet Take 1 tablet (81 mg total) by mouth daily.   atorvastatin (LIPITOR) 20 MG tablet Take 1 tablet by mouth once daily   Cholecalciferol 25 MCG (1000 UT) tablet Take by mouth.   Cranberry 400 MG TABS Take 400 mg by mouth daily.   fexofenadine-pseudoephedrine (ALLEGRA-D 24) 180-240 MG 24 hr tablet Take 1 tablet by mouth daily.   Glucosamine-Chondroit-Vit C-Mn (GLUCOSAMINE CHONDR 500 COMPLEX PO) Take 2 tablets by mouth daily.   levothyroxine (EUTHYROX) 50 MCG tablet Take 1 tablet (50 mcg total) by mouth daily with breakfast.   losartan (COZAAR) 50 MG tablet Take 1 tablet by mouth once daily   Multiple Vitamin (MULTIVITAMIN) tablet Take 1 tablet by mouth daily.   Omega-3 Fatty Acids (FISH OIL) 1000 MG CAPS Take 1,000 mg by mouth daily.   pantoprazole (PROTONIX) 40 MG tablet One tablet 30 minutes before breakfast and one tablet 30 minutes before your evening meal - prn.   Probiotic Product (PROBIOTIC DAILY PO) Take by mouth daily.   sertraline (ZOLOFT) 50 MG tablet Take 1 tablet by mouth once daily   No facility-administered encounter medications on file as of 05/06/2021.     Lab Results  Component Value Date   WBC 6.9 11/27/2020   HGB 14.2 11/27/2020   HCT 40.4 11/27/2020   PLT 184 01/03/2021   GLUCOSE 92 05/06/2021   CHOL 173 05/06/2021   TRIG 220.0 (H) 05/06/2021   HDL 40.70 05/06/2021   LDLDIRECT 90.0 05/06/2021   LDLCALC 75 09/20/2020   ALT 16 05/06/2021   AST 27 05/06/2021   NA 141 05/06/2021   K 4.6 05/06/2021   CL 107 05/06/2021   CREATININE  1.62 (H) 05/06/2021   BUN 24 (H) 05/06/2021   CO2 28 05/06/2021   TSH 24.44 (H) 05/06/2021   HGBA1C 5.8 05/06/2021   MICROALBUR 2.1 (H) 03/27/2015    DG Chest 2 View  Result Date: 11/27/2020 CLINICAL DATA:  Shortness of breath EXAM: CHEST - 2 VIEW COMPARISON:  02/18/2020 FINDINGS: The heart size and mediastinal contours are within normal limits. Both lungs are clear. Disc degenerative  disease of the thoracic spine. IMPRESSION: No acute abnormality of the lungs. Electronically Signed   By: Eddie Candle M.D.   On: 11/27/2020 12:54       Assessment & Plan:   Problem List Items Addressed This Visit     Anemia, iron deficiency    Follow cbc.       Aortic atherosclerosis (HCC)    Continue lipitor.       CKD (chronic kidney disease) stage 4, GFR 15-29 ml/min (HCC)    Followed by nephrology -  by Dr Percell Miller 05/2020.  Stable.  Recommended f/u in one year.  Continue to avoid antiinflammatories. Continue losartan.       Elevated alkaline phosphatase level    Persistent slight elevation of alkaline phosphatase.  GGT wnl.  Intact PTH and vitamin  D wnl.  Remainder of liver panel wnl.  Recheck liver panel today.       Fatty liver    Diet and exercise.  Follow liver panel.       GERD (gastroesophageal reflux disease)    Has noticed some acid reflux as outlined.  On protonix.  Add pepcid in the evening.  Follow.  Call with update.  Of note, EGD 08/2018 - ok.       Hypercholesterolemia    Continue lipitor.  Low cholesterol diet and exercise.  Follow lipid panel and liver function tests.        Relevant Orders   Lipid panel (Completed)   Hepatic function panel (Completed)   Basic metabolic panel (Completed)   TSH (Completed)   Hyperglycemia - Primary    Low carb diet and exercise.  Follow met b and a1c.        Relevant Orders   Hemoglobin A1c (Completed)   Nasal drainage    Persistent nasal dripping and drainage.  Just started allegra.  May be helping.  Add nasacort nasal spray.  Also, trial of pepcid.  Call with update.  Follow.       Stress    On zoloft and doing well on this medication.  Continue zoloft.  Follow.       Thrombocytopenia ([place])    Follow cbc.       Hypertension (Chronic)    Blood pressure as outlined. Continue amlodipine and losartan.  Follow pressures.  Follow metabolic panel.       Hypothyroidism (Chronic)    On thyroid replacement.   Follow tsh.         Einar Pheasant, MD

## 2021-05-07 ENCOUNTER — Other Ambulatory Visit: Payer: Self-pay

## 2021-05-07 ENCOUNTER — Encounter: Payer: Self-pay | Admitting: Internal Medicine

## 2021-05-07 DIAGNOSIS — E039 Hypothyroidism, unspecified: Secondary | ICD-10-CM

## 2021-05-07 DIAGNOSIS — J3489 Other specified disorders of nose and nasal sinuses: Secondary | ICD-10-CM | POA: Insufficient documentation

## 2021-05-07 MED ORDER — LEVOTHYROXINE SODIUM 75 MCG PO TABS: 75.0000 ug | ORAL_TABLET | Freq: Every day | ORAL | 1 refills | Status: DC

## 2021-05-07 NOTE — Assessment & Plan Note (Signed)
On thyroid replacement.  Follow tsh.  

## 2021-05-07 NOTE — Assessment & Plan Note (Signed)
Follow cbc.  

## 2021-05-07 NOTE — Assessment & Plan Note (Signed)
Blood pressure as outlined. Continue amlodipine and losartan.  Follow pressures.  Follow metabolic panel.

## 2021-05-07 NOTE — Assessment & Plan Note (Signed)
Continue lipitor  ?

## 2021-05-07 NOTE — Assessment & Plan Note (Signed)
Persistent slight elevation of alkaline phosphatase.  GGT wnl.  Intact PTH and vitamin  D wnl.  Remainder of liver panel wnl.  Recheck liver panel today.

## 2021-05-07 NOTE — Assessment & Plan Note (Signed)
Persistent nasal dripping and drainage.  Just started allegra.  May be helping.  Add nasacort nasal spray.  Also, trial of pepcid.  Call with update.  Follow.

## 2021-05-07 NOTE — Assessment & Plan Note (Signed)
Has noticed some acid reflux as outlined.  On protonix.  Add pepcid in the evening.  Follow.  Call with update.  Of note, EGD 08/2018 - ok.

## 2021-05-07 NOTE — Assessment & Plan Note (Signed)
Low carb diet and exercise.  Follow met b and a1c.

## 2021-05-07 NOTE — Assessment & Plan Note (Signed)
Followed by nephrology -  by Dr Percell Miller 05/2020.  Stable.  Recommended f/u in one year.  Continue to avoid antiinflammatories. Continue losartan.

## 2021-05-07 NOTE — Assessment & Plan Note (Signed)
Diet and exercise.  Follow liver panel.

## 2021-05-07 NOTE — Assessment & Plan Note (Signed)
Continue lipitor.  Low cholesterol diet and exercise.  Follow lipid panel and liver function tests.

## 2021-05-07 NOTE — Assessment & Plan Note (Signed)
On zoloft and doing well on this medication.  Continue zoloft.  Follow.

## 2021-05-19 DIAGNOSIS — I10 Essential (primary) hypertension: Secondary | ICD-10-CM | POA: Diagnosis not present

## 2021-05-19 DIAGNOSIS — E875 Hyperkalemia: Secondary | ICD-10-CM | POA: Diagnosis not present

## 2021-05-19 DIAGNOSIS — N184 Chronic kidney disease, stage 4 (severe): Secondary | ICD-10-CM | POA: Diagnosis not present

## 2021-05-20 DIAGNOSIS — E875 Hyperkalemia: Secondary | ICD-10-CM | POA: Diagnosis not present

## 2021-05-20 DIAGNOSIS — N1832 Chronic kidney disease, stage 3b: Secondary | ICD-10-CM | POA: Diagnosis not present

## 2021-05-20 DIAGNOSIS — I10 Essential (primary) hypertension: Secondary | ICD-10-CM | POA: Diagnosis not present

## 2021-05-24 ENCOUNTER — Other Ambulatory Visit: Payer: Self-pay | Admitting: Internal Medicine

## 2021-05-29 DIAGNOSIS — N1832 Chronic kidney disease, stage 3b: Secondary | ICD-10-CM | POA: Diagnosis not present

## 2021-05-29 DIAGNOSIS — E875 Hyperkalemia: Secondary | ICD-10-CM | POA: Diagnosis not present

## 2021-06-11 DIAGNOSIS — J329 Chronic sinusitis, unspecified: Secondary | ICD-10-CM | POA: Diagnosis not present

## 2021-06-11 DIAGNOSIS — J309 Allergic rhinitis, unspecified: Secondary | ICD-10-CM | POA: Diagnosis not present

## 2021-06-11 DIAGNOSIS — J301 Allergic rhinitis due to pollen: Secondary | ICD-10-CM | POA: Diagnosis not present

## 2021-06-11 DIAGNOSIS — R0981 Nasal congestion: Secondary | ICD-10-CM | POA: Diagnosis not present

## 2021-06-17 ENCOUNTER — Other Ambulatory Visit (INDEPENDENT_AMBULATORY_CARE_PROVIDER_SITE_OTHER): Payer: PPO

## 2021-06-17 DIAGNOSIS — E039 Hypothyroidism, unspecified: Secondary | ICD-10-CM

## 2021-06-18 LAB — TSH: TSH: 2.37 u[IU]/mL (ref 0.35–5.50)

## 2021-06-19 ENCOUNTER — Other Ambulatory Visit: Payer: Self-pay | Admitting: Internal Medicine

## 2021-06-19 DIAGNOSIS — D509 Iron deficiency anemia, unspecified: Secondary | ICD-10-CM

## 2021-06-19 DIAGNOSIS — E7800 Pure hypercholesterolemia, unspecified: Secondary | ICD-10-CM

## 2021-06-19 DIAGNOSIS — E039 Hypothyroidism, unspecified: Secondary | ICD-10-CM

## 2021-06-19 DIAGNOSIS — R739 Hyperglycemia, unspecified: Secondary | ICD-10-CM

## 2021-06-19 DIAGNOSIS — I10 Essential (primary) hypertension: Secondary | ICD-10-CM

## 2021-06-19 DIAGNOSIS — N184 Chronic kidney disease, stage 4 (severe): Secondary | ICD-10-CM

## 2021-06-19 NOTE — Progress Notes (Signed)
Orders placed for f/u labs.  

## 2021-07-02 DIAGNOSIS — J309 Allergic rhinitis, unspecified: Secondary | ICD-10-CM | POA: Diagnosis not present

## 2021-07-02 DIAGNOSIS — R0981 Nasal congestion: Secondary | ICD-10-CM | POA: Diagnosis not present

## 2021-07-02 DIAGNOSIS — J329 Chronic sinusitis, unspecified: Secondary | ICD-10-CM | POA: Diagnosis not present

## 2021-07-14 DIAGNOSIS — Z1211 Encounter for screening for malignant neoplasm of colon: Secondary | ICD-10-CM | POA: Diagnosis not present

## 2021-08-25 ENCOUNTER — Other Ambulatory Visit: Payer: Self-pay | Admitting: Family

## 2021-08-27 ENCOUNTER — Other Ambulatory Visit: Payer: Self-pay | Admitting: Internal Medicine

## 2021-09-03 ENCOUNTER — Other Ambulatory Visit (INDEPENDENT_AMBULATORY_CARE_PROVIDER_SITE_OTHER): Payer: PPO

## 2021-09-03 DIAGNOSIS — R739 Hyperglycemia, unspecified: Secondary | ICD-10-CM

## 2021-09-03 DIAGNOSIS — E039 Hypothyroidism, unspecified: Secondary | ICD-10-CM | POA: Diagnosis not present

## 2021-09-03 DIAGNOSIS — N184 Chronic kidney disease, stage 4 (severe): Secondary | ICD-10-CM | POA: Diagnosis not present

## 2021-09-03 DIAGNOSIS — E7800 Pure hypercholesterolemia, unspecified: Secondary | ICD-10-CM

## 2021-09-03 DIAGNOSIS — D509 Iron deficiency anemia, unspecified: Secondary | ICD-10-CM | POA: Diagnosis not present

## 2021-09-03 LAB — BASIC METABOLIC PANEL
BUN: 26 mg/dL — ABNORMAL HIGH (ref 6–23)
CO2: 28 mEq/L (ref 19–32)
Calcium: 9.7 mg/dL (ref 8.4–10.5)
Chloride: 106 mEq/L (ref 96–112)
Creatinine, Ser: 1.65 mg/dL — ABNORMAL HIGH (ref 0.40–1.20)
GFR: 29.2 mL/min — ABNORMAL LOW (ref >60.00)
Glucose, Bld: 87 mg/dL (ref 70–99)
Potassium: 5.1 mEq/L (ref 3.5–5.1)
Sodium: 141 mEq/L (ref 135–145)

## 2021-09-03 LAB — HEPATIC FUNCTION PANEL
ALT: 16 U/L (ref 0–35)
AST: 23 U/L (ref 0–37)
Albumin: 3.8 g/dL (ref 3.5–5.2)
Alkaline Phosphatase: 107 U/L (ref 39–117)
Bilirubin, Direct: 0.1 mg/dL (ref 0.0–0.3)
Total Bilirubin: 0.6 mg/dL (ref 0.2–1.2)
Total Protein: 6.6 g/dL (ref 6.0–8.3)

## 2021-09-03 LAB — CBC WITH DIFFERENTIAL/PLATELET
Basophils Absolute: 0 10*3/uL (ref 0.0–0.1)
Basophils Relative: 0.5 % (ref 0.0–3.0)
Eosinophils Absolute: 0.1 10*3/uL (ref 0.0–0.7)
Eosinophils Relative: 2.7 % (ref 0.0–5.0)
HCT: 37.7 % (ref 36.0–46.0)
Hemoglobin: 12.5 g/dL (ref 12.0–15.0)
Lymphocytes Relative: 45.1 % (ref 12.0–46.0)
Lymphs Abs: 1.7 10*3/uL (ref 0.7–4.0)
MCHC: 33.2 g/dL (ref 30.0–36.0)
MCV: 90.4 fl (ref 78.0–100.0)
Monocytes Absolute: 0.6 10*3/uL (ref 0.1–1.0)
Monocytes Relative: 15.3 % — ABNORMAL HIGH (ref 3.0–12.0)
Neutro Abs: 1.4 10*3/uL (ref 1.4–7.7)
Neutrophils Relative %: 36.4 % — ABNORMAL LOW (ref 43.0–77.0)
Platelets: 165 10*3/uL (ref 150.0–400.0)
RBC: 4.17 Mil/uL (ref 3.87–5.11)
RDW: 13.4 % (ref 11.5–15.5)
WBC: 3.7 10*3/uL — ABNORMAL LOW (ref 4.0–10.5)

## 2021-09-03 LAB — LIPID PANEL
Cholesterol: 163 mg/dL (ref 0–200)
HDL: 38 mg/dL — ABNORMAL LOW (ref >39.00)
NonHDL: 125.23
Total CHOL/HDL Ratio: 4
Triglycerides: 222 mg/dL — ABNORMAL HIGH (ref 0.0–149.0)
VLDL: 44.4 mg/dL — ABNORMAL HIGH (ref 0.0–40.0)

## 2021-09-03 LAB — FERRITIN: Ferritin: 55.4 ng/mL (ref 10.0–291.0)

## 2021-09-03 LAB — HEMOGLOBIN A1C: Hgb A1c MFr Bld: 5.8 % (ref 4.6–6.5)

## 2021-09-03 LAB — LDL CHOLESTEROL, DIRECT: Direct LDL: 83 mg/dL

## 2021-09-03 LAB — TSH: TSH: 1.52 u[IU]/mL (ref 0.35–5.50)

## 2021-09-04 ENCOUNTER — Ambulatory Visit: Payer: PPO | Admitting: Internal Medicine

## 2021-09-04 DIAGNOSIS — H353131 Nonexudative age-related macular degeneration, bilateral, early dry stage: Secondary | ICD-10-CM | POA: Diagnosis not present

## 2021-09-05 ENCOUNTER — Encounter: Payer: Self-pay | Admitting: Internal Medicine

## 2021-09-05 ENCOUNTER — Ambulatory Visit (INDEPENDENT_AMBULATORY_CARE_PROVIDER_SITE_OTHER): Payer: PPO | Admitting: Internal Medicine

## 2021-09-05 VITALS — BP 130/80 | HR 67 | Temp 98.1°F | Resp 15 | Ht 64.0 in | Wt 156.2 lb

## 2021-09-05 DIAGNOSIS — E7800 Pure hypercholesterolemia, unspecified: Secondary | ICD-10-CM | POA: Diagnosis not present

## 2021-09-05 DIAGNOSIS — R748 Abnormal levels of other serum enzymes: Secondary | ICD-10-CM | POA: Diagnosis not present

## 2021-09-05 DIAGNOSIS — I351 Nonrheumatic aortic (valve) insufficiency: Secondary | ICD-10-CM | POA: Diagnosis not present

## 2021-09-05 DIAGNOSIS — R531 Weakness: Secondary | ICD-10-CM | POA: Diagnosis not present

## 2021-09-05 DIAGNOSIS — I10 Essential (primary) hypertension: Secondary | ICD-10-CM

## 2021-09-05 DIAGNOSIS — N184 Chronic kidney disease, stage 4 (severe): Secondary | ICD-10-CM | POA: Diagnosis not present

## 2021-09-05 DIAGNOSIS — R1030 Lower abdominal pain, unspecified: Secondary | ICD-10-CM | POA: Diagnosis not present

## 2021-09-05 DIAGNOSIS — D696 Thrombocytopenia, unspecified: Secondary | ICD-10-CM | POA: Diagnosis not present

## 2021-09-05 DIAGNOSIS — D509 Iron deficiency anemia, unspecified: Secondary | ICD-10-CM

## 2021-09-05 DIAGNOSIS — J3489 Other specified disorders of nose and nasal sinuses: Secondary | ICD-10-CM

## 2021-09-05 DIAGNOSIS — I700 Atherosclerosis of aorta: Secondary | ICD-10-CM

## 2021-09-05 DIAGNOSIS — K760 Fatty (change of) liver, not elsewhere classified: Secondary | ICD-10-CM | POA: Diagnosis not present

## 2021-09-05 DIAGNOSIS — Z1231 Encounter for screening mammogram for malignant neoplasm of breast: Secondary | ICD-10-CM

## 2021-09-05 DIAGNOSIS — R29898 Other symptoms and signs involving the musculoskeletal system: Secondary | ICD-10-CM

## 2021-09-05 DIAGNOSIS — E875 Hyperkalemia: Secondary | ICD-10-CM

## 2021-09-05 DIAGNOSIS — E039 Hypothyroidism, unspecified: Secondary | ICD-10-CM

## 2021-09-05 DIAGNOSIS — R739 Hyperglycemia, unspecified: Secondary | ICD-10-CM

## 2021-09-05 MED ORDER — ATORVASTATIN CALCIUM 20 MG PO TABS: 20.0000 mg | ORAL_TABLET | Freq: Every day | ORAL | 3 refills | Status: DC

## 2021-09-05 MED ORDER — LOSARTAN POTASSIUM 50 MG PO TABS: 50.0000 mg | ORAL_TABLET | Freq: Every day | ORAL | 2 refills | Status: DC

## 2021-09-06 ENCOUNTER — Encounter: Payer: Self-pay | Admitting: Internal Medicine

## 2021-09-06 DIAGNOSIS — R29898 Other symptoms and signs involving the musculoskeletal system: Secondary | ICD-10-CM | POA: Insufficient documentation

## 2021-09-06 NOTE — Assessment & Plan Note (Signed)
Abdominal pain and discomfort as outlined.  Feels better when she does not eat.  Feels like food - stops.  Taking miralax on the evening and benefiber in the am ot try to keep bowels moving.  Noticed blood in the toilet approximately one month ago with bm.  No further bleeding since.  Some nausea.  On PPI.  Will continue.  Discussed further w/up and evaluation.  Scheduled for colonoscopy in 10/2021.  Discussed f/u with GI and question of need for EGD.  Also discussed question of need for gastric emptying study.  Contacted GI. They plan to follow up for further testing and evaluation.  Hold on changing medication.

## 2021-09-06 NOTE — Assessment & Plan Note (Signed)
Saw ENT.  Using nasal wash and nasal spray.  Helping.

## 2021-09-06 NOTE — Assessment & Plan Note (Signed)
Follow cbc.  

## 2021-09-09 ENCOUNTER — Other Ambulatory Visit: Payer: Self-pay | Admitting: Gastroenterology

## 2021-09-09 DIAGNOSIS — R1013 Epigastric pain: Secondary | ICD-10-CM

## 2021-09-22 ENCOUNTER — Ambulatory Visit: Payer: PPO | Attending: Internal Medicine | Admitting: Physical Therapy

## 2021-09-22 ENCOUNTER — Other Ambulatory Visit: Payer: Self-pay

## 2021-09-22 DIAGNOSIS — R29898 Other symptoms and signs involving the musculoskeletal system: Secondary | ICD-10-CM | POA: Diagnosis not present

## 2021-09-22 DIAGNOSIS — M6281 Muscle weakness (generalized): Secondary | ICD-10-CM | POA: Insufficient documentation

## 2021-09-22 NOTE — Therapy (Signed)
OUTPATIENT PHYSICAL THERAPY LOWER EXTREMITY EVALUATION   Patient Name: Caitlin Lin MRN: [id number] DOB:1941-01-13, 81 y.o., female Today's Date: 09/22/2021   PT End of Session - 09/22/21 1305     Visit Number 1    Number of Visits 1    PT Start Time 0930    PT Stop Time 1015    PT Time Calculation (min) 45 min    Activity Tolerance Patient tolerated treatment well    Behavior During Therapy WFL for tasks assessed/performed             Past Medical History:  Diagnosis Date   Cataracts, bilateral    Chronic kidney disease    Followed by Dr. Holley Raring   Chronic kidney disease    Colon polyp    Constipation due to slow transit    Diarrhea    Diarrhea    GERD (gastroesophageal reflux disease)    Heart murmur    History of cyst of breast    History of hiatal hernia    Hyperlipidemia    Hypertension    Hypothyroidism    Melanoma of lower leg ([place]) 2013   Shingles 2013   Shingles    Spinal stenosis in cervical region    Dr. Sharlet Salina   Subdural hematoma [facility])    Thyroid disease    Past Surgical History:  Procedure Laterality Date   ABDOMINAL HYSTERECTOMY  1974   ANTERIOR AND POSTERIOR VAGINAL REPAIR     ANTERIOR AND POSTERIOR VAGINAL REPAIR W/ SACROSPINOUS LIGAMENT SUSPENSION Right    BREAST BIOPSY  1963   Benign per pt's report   COLONOSCOPY     ELECTROMAGNETIC NAVIGATION BROCHOSCOPY Right 01/09/2019   Procedure: ELECTROMAGNETIC NAVIGATION BRONCHOSCOPY;  Surgeon: Tyler Pita, MD;  Location: [facility] ORS;  Service: Cardiopulmonary;  Laterality: Right;   ESOPHAGOGASTRODUODENOSCOPY     ESOPHAGOGASTRODUODENOSCOPY (EGD) WITH PROPOFOL N/A 11/10/2016   Procedure: ESOPHAGOGASTRODUODENOSCOPY (EGD) WITH PROPOFOL;  Surgeon: Lollie Sails, MD;  Location: [facility] ENDOSCOPY;  Service: Endoscopy;  Laterality: N/A;   ESOPHAGOGASTRODUODENOSCOPY (EGD) WITH PROPOFOL N/A 09/09/2018   Procedure: ESOPHAGOGASTRODUODENOSCOPY (EGD) WITH PROPOFOL;  Surgeon: Lollie Sails, MD;   [facility] ENDOSCOPY;  Service: Endoscopy;  Laterality: N/A;   ESOPHAGOGASTRODUODENOSCOPY (EGD) WITH PROPOFOL N/A 10/27/2019   Procedure: ESOPHAGOGASTRODUODENOSCOPY (EGD) WITH PROPOFOL;  Surgeon: Robert Bellow, MD;  Location: [facility] ENDOSCOPY;  Service: Endoscopy;  Laterality: N/A;   ESOPHAGOGASTRODUODENOSCOPY (EGD) WITH PROPOFOL N/A 08/09/2020   Procedure: ESOPHAGOGASTRODUODENOSCOPY (EGD) WITH PROPOFOL;  Surgeon: Lesly Rubenstein, MD;  Location: [facility] ENDOSCOPY;  Service: Endoscopy;  Laterality: N/A;   LUNG BIOPSY  2007   Benign per pt's report   LUNG BIOPSY Right    OOPHORECTOMY  1970   PERINEOPLASTY  12/17/2013   sling for stress incontinence  12/18/2011   Patient Active Problem List   Diagnosis Date Noted   Weakness of both legs 09/06/2021   Nasal drainage 05/07/2021   Throat congestion 01/05/2021   Thrombocytopenia (HCC) 01/03/2021   Elevated alkaline phosphatase level 09/30/2020   Hyperkalemia 09/23/2020   Stress 05/18/2020   Fatigue 03/30/2020   Leg swelling 03/30/2020   Dysphagia 08/09/2019   Sleep difficulties 11/23/2018   Numbness of toes 11/23/2018   Pelvic pressure in female 08/28/2018   Hyperglycemia 11/20/2017   Aortic atherosclerosis ([place]) 06/16/2017   Low back pain 06/06/2017   Abdominal pain 06/03/2017   Fatty liver 02/13/2017   Aortic regurgitation 07/01/2016   Health care maintenance 05/12/2016   Chest pain 05/12/2016   Angular  cheilitis 03/27/2015   TMJ arthralgia 09/25/2014   GERD (gastroesophageal reflux disease) 12/05/2013   Osteopenia 10/30/2013   Hypercholesterolemia 03/04/2013   Medicare annual wellness visit, subsequent 09/05/2012   Anemia, iron deficiency 06/08/2012   Screening for breast cancer 08/03/2011   Rectal prolapse 08/03/2011   Hypertension 02/09/2011   CKD (chronic kidney disease) stage 4, GFR 15-29 ml/min ([place]) 02/09/2011   Hypothyroidism 02/09/2011    PCP: Dr. Einar Pheasant  REFERRING PROVIDER: Dr. Einar Pheasant    REFERRING DIAG: R29.898 (ICD-10-CM) - Weakness of both legs  THERAPY DIAG:  Muscle weakness (generalized)  Rationale for Evaluation and Treatment Rehabilitation  ONSET DATE: 08/23/21   SUBJECTIVE:   SUBJECTIVE STATEMENT: Pt reports that she has had increased weakness since having COVID in September to point where she had to use walls for UE support. She has since felt better but still feels like her legs are weak and feels like they are going to give out every once and a while. Pt reports having neuropathy and thinks that this has more to do with her imbalance than anything else. She reports her biggest concern is the increasing frequency and severity of syncopal episodes. Pt also reports experiencing increased pressure to take care of her family members who she reports are having worse health and require significant care.    PERTINENT HISTORY: Per Dr. Bary Leriche note  on 09/05/21     Weakness - knees.  Affecting walking.  Discussed PT.  Agreeable to evaluate and treat.  With weakness noted when walking, EKG - SR with no acute ischemic changes.  No chest pain.  Breathing stable.  Follow.   PAIN:  Are you having pain? No  PRECAUTIONS: None  WEIGHT BEARING RESTRICTIONS No  FALLS:  Has patient fallen in last 6 months? 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    OBJECTIVE:               VITALS: BP 134/68 HR 97 SpO2 68  DIAGNOSTIC FINDINGS: None taken   PATIENT SURVEYS:  FOTO Not taken   COGNITION:  Overall cognitive status: Within functional limits for tasks assessed     SENSATION: WFL    POSTURE: No Significant postural limitations  PALPATION: N/a   LOWER EXTREMITY ROM:  WFL   LOWER  EXTREMITY MMT:  WFL    FUNCTIONAL TESTS:   STEADI 4-Stage Balance Test: (inability to maintain tandem stance for 10 sec = increased risk for falls) - feet together, eyes open, noncompliant surface: 10/10 sec - semi-tandem stance, eyes open, noncompliant surface: 10/10 sec - tandem stances, eyes open, noncompliant surface: 10/10 sec - single leg stance:         - R LE: 10/10 sec         - L LE: 10/10 sec  5 Times Sit to Stand: 10 sec  (Individuals with times that exceed the listed time have worse than average performance - predictive of recurrent falls in healthy community-living subjects)         - 80-89 y.o. 14.8 sec   30 sec chair stands: 13  <9 for women ages 53-84  *A below average score indicates a risk for falls  Modified Clinical Test of Sensory Interaction  for Balance    (MCTSIB):  CONDITION TIME STRATEGY SWAY  Eyes open, firm surface 30 seconds ankle Mild  Eyes closed, firm surface 30 seconds ankle Mild   Eyes open, foam surface 30 seconds ankle Mild   Eyes closed, foam surface 30 seconds ankle          Moderate      GAIT: Distance walked: 40 ft  Assistive device utilized: None Level of assistance: Complete Independence Comments: No gait deficits noted     TODAY'S TREATMENT:             Sit to Stand with 8 lbs x 2 - 1 x 10               -min VC for quick concentric and slow eccentric              Romberg with EC on foam with vertical head turns 1 x 10  Romberg with EC on foam with horizontal head turns 1 x 10    PATIENT EDUCATION:  Education details: form and technique for appropriate exercise and explanation of cut-offs for outcome measures  Person educated: Patient Education method: Consulting civil engineer, Demonstration, Verbal cues, and Handouts Education comprehension: verbalized understanding, returned demonstration, and verbal cues required   HOME EXERCISE PROGRAM: Access Code: [email or web address] URL: [email or web address] Date:  09/22/2021 Prepared by: Bradly Chris  Exercises - Sit to Stand  - 1 x daily - 7 x weekly - 3 sets - 10 reps - Narrow Stance with Eyes Closed and Head Nods on Foam Pad  - 1 x daily - 7 x weekly - 3 sets - 10 reps - Narrow Stance with Eyes Closed and Head Rotation on Foam Pad  - 1 x daily - 7 x weekly - 3 sets - 10 reps  ASSESSMENT:  CLINICAL IMPRESSION: Patient is a 81 y.o. white female who was seen today for physical therapy evaluation and treatment for LE weakness. She exhibits LE strength and endurance that are within gender and age related norms. She has exhibits static balance that does not place her at an increased risk for falls with slight balance checks with decreased visual and somatosensory input and reliance on vestibular system. PT does not recommend additional PT and pt can self-manage with HEP given little to no LE strength or balance deficits.      OBJECTIVE IMPAIRMENTS  None observed .   ACTIVITY LIMITATIONS  None listed   PARTICIPATION LIMITATIONS:  Able to participate in everything  PERSONAL FACTORS  None   are also affecting patient's functional outcome.   REHAB POTENTIAL: Excellent  CLINICAL DECISION MAKING: Stable/uncomplicated  EVALUATION COMPLEXITY: Low   GOALS: Goals reviewed with patient? No  SHORT TERM GOALS: Target date: 10/06/2021  Pt will be independent with HEP in order to improve strength and balance in order to decrease fall risk and improve function at home and work. Baseline: Able to demonstrate independence. Goal status: INITIAL   LONG TERM GOALS: Target date: 11/17/2021   Patient will have improved function and activity level as evidenced by an increase in FOTO score by 10 points or more.  Baseline: Patient refused to take because this eval only  Goal status: INITIAL  PLAN: PT FREQUENCY: one time visit  PT DURATION: other: Eval only   PLANNED INTERVENTIONS: N/a  PLAN FOR NEXT SESSION: N/a   Bradly Chris PT, DPT  09/22/2021,  1:06 PM

## 2021-09-24 ENCOUNTER — Ambulatory Visit: Payer: PPO | Admitting: Physical Therapy

## 2021-09-25 ENCOUNTER — Other Ambulatory Visit: Payer: PPO

## 2021-09-26 ENCOUNTER — Ambulatory Visit
Admission: RE | Admit: 2021-09-26 | Discharge: 2021-09-26 | Disposition: A | Discharge status: Home / Self Care | Payer: PPO | Source: Ambulatory Visit | Attending: Gastroenterology | Admitting: Gastroenterology

## 2021-09-26 DIAGNOSIS — R1013 Epigastric pain: Secondary | ICD-10-CM | POA: Diagnosis not present

## 2021-09-26 MED ORDER — TECHNETIUM TC 99M SULFUR COLLOID
2.0000 | Freq: Once | INTRAVENOUS | Status: AC
  Administered 2021-09-26: 2.17 via INTRAVENOUS

## 2021-09-29 ENCOUNTER — Encounter: Payer: PPO | Admitting: Physical Therapy

## 2021-10-01 ENCOUNTER — Encounter: Payer: PPO | Admitting: Physical Therapy

## 2021-10-06 ENCOUNTER — Encounter: Payer: PPO | Admitting: Physical Therapy

## 2021-10-08 ENCOUNTER — Encounter: Payer: PPO | Admitting: Physical Therapy

## 2021-10-13 ENCOUNTER — Encounter: Payer: PPO | Admitting: Physical Therapy

## 2021-10-15 ENCOUNTER — Encounter: Payer: PPO | Admitting: Physical Therapy

## 2021-10-29 ENCOUNTER — Encounter: Payer: Self-pay | Admitting: Internal Medicine

## 2021-10-29 ENCOUNTER — Ambulatory Visit: Payer: PPO | Admitting: Anesthesiology

## 2021-10-29 ENCOUNTER — Ambulatory Visit
Admission: RE | Admit: 2021-10-29 | Discharge: 2021-10-29 | Disposition: A | Discharge status: Home / Self Care | Payer: PPO | Attending: Internal Medicine | Admitting: Internal Medicine

## 2021-10-29 ENCOUNTER — Encounter
Admission: RE | Disposition: A | Discharge status: Home / Self Care | Payer: Self-pay | Source: Home / Self Care | Attending: Internal Medicine

## 2021-10-29 DIAGNOSIS — K642 Third degree hemorrhoids: Secondary | ICD-10-CM | POA: Insufficient documentation

## 2021-10-29 DIAGNOSIS — K219 Gastro-esophageal reflux disease without esophagitis: Secondary | ICD-10-CM | POA: Diagnosis not present

## 2021-10-29 DIAGNOSIS — Z1211 Encounter for screening for malignant neoplasm of colon: Secondary | ICD-10-CM | POA: Insufficient documentation

## 2021-10-29 DIAGNOSIS — N189 Chronic kidney disease, unspecified: Secondary | ICD-10-CM | POA: Diagnosis not present

## 2021-10-29 DIAGNOSIS — Z85820 Personal history of malignant melanoma of skin: Secondary | ICD-10-CM | POA: Insufficient documentation

## 2021-10-29 DIAGNOSIS — Z79890 Hormone replacement therapy (postmenopausal): Secondary | ICD-10-CM | POA: Diagnosis not present

## 2021-10-29 DIAGNOSIS — I129 Hypertensive chronic kidney disease with stage 1 through stage 4 chronic kidney disease, or unspecified chronic kidney disease: Secondary | ICD-10-CM | POA: Diagnosis not present

## 2021-10-29 DIAGNOSIS — E785 Hyperlipidemia, unspecified: Secondary | ICD-10-CM | POA: Insufficient documentation

## 2021-10-29 DIAGNOSIS — E039 Hypothyroidism, unspecified: Secondary | ICD-10-CM | POA: Insufficient documentation

## 2021-10-29 DIAGNOSIS — Z79899 Other long term (current) drug therapy: Secondary | ICD-10-CM | POA: Diagnosis not present

## 2021-10-29 DIAGNOSIS — K5730 Diverticulosis of large intestine without perforation or abscess without bleeding: Secondary | ICD-10-CM | POA: Insufficient documentation

## 2021-10-29 DIAGNOSIS — K644 Residual hemorrhoidal skin tags: Secondary | ICD-10-CM | POA: Diagnosis not present

## 2021-10-29 HISTORY — PX: COLONOSCOPY: SHX5424

## 2021-10-29 SURGERY — COLONOSCOPY
Anesthesia: General

## 2021-10-29 MED ORDER — PROPOFOL 1000 MG/100ML IV EMUL
INTRAVENOUS | Status: AC
  Filled 2021-10-29: qty 100

## 2021-10-29 MED ORDER — LIDOCAINE HCL (PF) 2 % IJ SOLN
INTRAMUSCULAR | Status: AC
  Filled 2021-10-29: qty 5

## 2021-10-29 MED ORDER — PROPOFOL 10 MG/ML IV BOLUS
INTRAVENOUS | Status: DC | PRN
  Administered 2021-10-29: 50 mg via INTRAVENOUS
  Administered 2021-10-29: 10 mg via INTRAVENOUS

## 2021-10-29 MED ORDER — PROPOFOL 500 MG/50ML IV EMUL
INTRAVENOUS | Status: DC | PRN
  Administered 2021-10-29: 75 ug/kg/min via INTRAVENOUS

## 2021-10-29 MED ORDER — LIDOCAINE HCL (CARDIAC) PF 100 MG/5ML IV SOSY
PREFILLED_SYRINGE | INTRAVENOUS | Status: DC | PRN
  Administered 2021-10-29: 50 mg via INTRAVENOUS

## 2021-10-29 MED ORDER — PROPOFOL 10 MG/ML IV BOLUS
INTRAVENOUS | Status: AC
  Filled 2021-10-29: qty 40

## 2021-10-29 MED ORDER — SODIUM CHLORIDE 0.9 % IV SOLN: INTRAVENOUS | Status: DC

## 2021-10-29 NOTE — Transfer of Care (Signed)
Immediate Anesthesia Transfer of Care Note  Patient: Caitlin Lin  Procedure(s) Performed: COLONOSCOPY  Patient Location: PACU  Anesthesia Type:General  Level of Consciousness: sedated  Airway & Oxygen Therapy: Patient Spontanous Breathing and Patient connected to nasal cannula oxygen  Post-op Assessment: Report given to RN and Post -op Vital signs reviewed and stable  Post vital signs: Reviewed and stable  Last Vitals:  Vitals Value Taken Time  BP    Temp    Pulse    Resp    SpO2      Last Pain: There were no vitals filed for this visit.       Complications: No notable events documented.

## 2021-10-29 NOTE — Interval H&P Note (Signed)
History and Physical Interval Note:  10/29/2021 1:18 PM  Caitlin Lin  has presented today for surgery, with the diagnosis of Colon cancer screening (Z12.11).  The various methods of treatment have been discussed with the patient and family. After consideration of risks, benefits and other options for treatment, the patient has consented to  Procedure(s): COLONOSCOPY (N/A) as a surgical intervention.  The patient's history has been reviewed, patient examined, no change in status, stable for surgery.  I have reviewed the patient's chart and labs.  Questions were answered to the patient's satisfaction.     [place], [place]

## 2021-10-29 NOTE — Op Note (Signed)
[facility] Gastroenterology Patient Name: Caitlin Lin Procedure Date: 10/29/2021 1:09 PM MRN: [id number] Account #: [id number] Date of Birth: 1940-07-05 Admit Type: Outpatient Age: 81 Room: [facility] ENDO ROOM 2 Gender: Female Note Status: Finalized Instrument Name: Jasper Riling [id number] Procedure:             Colonoscopy Indications:           Screening for colorectal malignant neoplasm Providers:             Lorie Apley K. Arvis Miguez MD, MD Medicines:             Propofol per Anesthesia Complications:         No immediate complications. Procedure:             Pre-Anesthesia Assessment:                        - The risks and benefits of the procedure and the                         sedation options and risks were discussed with the                         patient. All questions were answered and informed                         consent was obtained.                        - Patient identification and proposed procedure were                         verified prior to the procedure by the nurse. The                         procedure was verified in the procedure room.                        - ASA Grade Assessment: III - A patient with severe                         systemic disease.                        - After reviewing the risks and benefits, the patient                         was deemed in satisfactory condition to undergo the                         procedure.                        After obtaining informed consent, the colonoscope was                         passed under direct vision. Throughout the procedure,                         the patient's blood pressure, pulse, and oxygen  saturations were monitored continuously. The                         Colonoscope was introduced through the anus and                         advanced to the the cecum, identified by appendiceal                         orifice and ileocecal valve. The colonoscopy was                          performed without difficulty. The patient tolerated                         the procedure well. The quality of the bowel                         preparation was adequate. The ileocecal valve,                         appendiceal orifice, and rectum were photographed. Findings:      The perianal exam findings include internal hemorrhoids that prolapse       with straining, but require manual replacement into the anal canal       (Grade III).      Non-bleeding internal hemorrhoids were found during retroflexion. The       hemorrhoids were Grade III (internal hemorrhoids that prolapse but       require manual reduction).      Multiple small and large-mouthed diverticula were found in the sigmoid       colon. There was no evidence of diverticular bleeding.      The exam was otherwise without abnormality. Impression:            - Internal hemorrhoids that prolapse with straining,                         but require manual replacement into the anal canal                         (Grade III) found on perianal exam.                        - Non-bleeding internal hemorrhoids.                        - Moderate diverticulosis in the sigmoid colon. There                         was no evidence of diverticular bleeding.                        - The examination was otherwise normal.                        - No specimens collected. Recommendation:        - Patient has a contact number available for  emergencies. The signs and symptoms of potential                         delayed complications were discussed with the patient.                         Return to normal activities tomorrow. Written                         discharge instructions were provided to the patient.                        - Resume previous diet.                        - Continue present medications.                        - No repeat colonoscopy due to current age (62 years                          or older) and the absence of colonic polyps.                        - You do NOT require further colon cancer screening                         measures (Annual stool testing (i.e. hemoccult, FIT,                         cologuard), sigmoidoscopy, colonoscopy or CT                         colonography). You should share this recommendation                         with your Primary Care provider.                        - Return to GI office PRN.                        - The findings and recommendations were discussed with                         the patient. Procedure Code(s):     --- Professional ---                        C9470, Colorectal cancer screening; colonoscopy on                         individual not meeting criteria for high risk Diagnosis Code(s):     --- Professional ---                        K57.30, Diverticulosis of large intestine without                         perforation or abscess without bleeding  K64.2, Third degree hemorrhoids                        Z12.11, Encounter for screening for malignant neoplasm                         of colon CPT copyright 2019 [employer]. All rights reserved. The codes documented in this report are preliminary and upon coder review may  be revised to meet current compliance requirements. Efrain Sella MD, MD 10/29/2021 1:49:25 PM This report has been signed electronically. Number of Addenda: 0 Note Initiated On: 10/29/2021 1:09 PM Scope Withdrawal Time: 0 hours 8 minutes 42 seconds  Total Procedure Duration: 0 hours 16 minutes 24 seconds  Estimated Blood Loss:  Estimated blood loss: none.      [facility]

## 2021-10-29 NOTE — H&P (Signed)
Outpatient short stay form Pre-procedure 10/29/2021 1:17 PM Caitlin Lin K. [name], M.D.  Primary Physician: Einar Pheasant, M.D.  Reason for visit:  Colon cancer screening  History of present illness:  Patient presents for colonoscopy for colon cancer screening. The patient denies complaints of abdominal pain, significant change in bowel habits, or rectal bleeding.      Current Facility-Administered Medications:    0.9 %  sodium chloride infusion, , Intravenous, Continuous, [place], Benay Pike, MD, Last Rate: 20 mL/hr at 10/29/21 1240, New Bag at 10/29/21 1240  Medications Prior to Admission  Medication Sig Dispense Refill Last Dose   Alpha-Lipoic Acid 200 MG TABS Take 200 mg by mouth 3 (three) times daily.   10/28/2021   amLODipine (NORVASC) 5 MG tablet Take 1 tablet by mouth once daily 90 tablet 0 10/28/2021   aspirin 81 MG tablet Take 1 tablet (81 mg total) by mouth daily. 30 tablet 3 10/28/2021   atorvastatin (LIPITOR) 20 MG tablet Take 1 tablet (20 mg total) by mouth daily. 90 tablet 3 10/28/2021   Cholecalciferol 25 MCG (1000 UT) tablet Take by mouth.   10/28/2021   levothyroxine (EUTHYROX) 75 MCG tablet Take 1 tablet (75 mcg total) by mouth daily with breakfast. 90 tablet 1 10/28/2021   losartan (COZAAR) 50 MG tablet Take 1 tablet (50 mg total) by mouth daily. 90 tablet 2 10/28/2021   Multiple Vitamin (MULTIVITAMIN) tablet Take 1 tablet by mouth daily.   10/28/2021   pantoprazole (PROTONIX) 40 MG tablet One tablet 30 minutes before breakfast and one tablet 30 minutes before your evening meal - prn. 180 tablet 1 10/28/2021   Probiotic Product (PROBIOTIC DAILY PO) Take by mouth daily.   10/28/2021   sertraline (ZOLOFT) 50 MG tablet Take 1 tablet by mouth once daily 90 tablet 1 10/28/2021   Cranberry 400 MG TABS Take 400 mg by mouth daily.      fexofenadine-pseudoephedrine (ALLEGRA-D 24) 180-240 MG 24 hr tablet Take 1 tablet by mouth daily.      Glucosamine-Chondroit-Vit C-Mn (GLUCOSAMINE CHONDR  500 COMPLEX PO) Take 2 tablets by mouth daily.      Omega-3 Fatty Acids (FISH OIL) 1000 MG CAPS Take 1,000 mg by mouth daily.        Allergies  Allergen Reactions   Quinapril Other (See Comments)    Hyperkalemia Hyperkalemia Hyperkalemia   Codeine Rash     Past Medical History:  Diagnosis Date   Cataracts, bilateral    Chronic kidney disease    Followed by Dr. Holley Raring   Chronic kidney disease    Colon polyp    Constipation due to slow transit    Diarrhea    Diarrhea    GERD (gastroesophageal reflux disease)    Heart murmur    History of cyst of breast    History of hiatal hernia    Hyperlipidemia    Hypertension    Hypothyroidism    Melanoma of lower leg ([place]) 2013   Shingles 2013   Shingles    Spinal stenosis in cervical region    Dr. Sharlet Salina   Subdural hematoma [facility])    Thyroid disease     Review of systems:  Otherwise negative.    Physical Exam  Gen: Alert, oriented. Appears stated age.  HEENT: [place]/AT. PERR[place]. Lungs: CTA, no wheezes. CV: RR nl S1, S2. Abd: soft, benign, no masses. BS+ Ext: No edema. Pulses 2+    Planned procedures: Proceed with colonoscopy. The patient understands the nature of the planned procedure,  indications, risks, alternatives and potential complications including but not limited to bleeding, infection, perforation, damage to internal organs and possible oversedation/side effects from anesthesia. The patient agrees and gives consent to proceed.  Please refer to procedure notes for findings, recommendations and patient disposition/instructions.     [name] K. [name], M.D. Gastroenterology 10/29/2021  1:17 PM

## 2021-10-29 NOTE — Interval H&P Note (Signed)
History and Physical Interval Note:  10/29/2021 1:18 PM  Caitlin Lin  has presented today for surgery, with the diagnosis of Colon cancer screening (Z12.11).  The various methods of treatment have been discussed with the patient and family. After consideration of risks, benefits and other options for treatment, the patient has consented to  Procedure(s): COLONOSCOPY (N/A) as a surgical intervention.  The patient's history has been reviewed, patient examined, no change in status, stable for surgery.  I have reviewed the patient's chart and labs.  Questions were answered to the patient's satisfaction.     [place], [place]

## 2021-10-29 NOTE — Anesthesia Preprocedure Evaluation (Addendum)
Anesthesia Evaluation  Patient identified by MRN, date of birth, ID band Patient awake    Reviewed: Allergy & Precautions, NPO status , Patient's Chart, lab work & pertinent test results  History of Anesthesia Complications Negative for: history of anesthetic complications  Airway Mallampati: II  TM Distance: >3 FB Neck ROM: Full    Dental no notable dental hx.    Pulmonary neg pulmonary ROS, neg sleep apnea, neg COPD,    breath sounds clear to auscultation- rhonchi (-) wheezing      Cardiovascular Exercise Tolerance: Good hypertension, Pt. on medications (-) CAD, (-) Past MI, (-) Cardiac Stents and (-) CABG + Valvular Problems/Murmurs AI  Rhythm:Regular Rate:Normal - Systolic murmurs and - Diastolic murmurs Aortic regurgitation  ECHO - aortic regurgitation.  Saw cardiology.  Coronary artery calcifications on noncontrast chest CT. Previous echocardiogram with normal systolic and diastolic function.[place] 03/2020 with no significant ischemia, low risk. Continue aspirin, statin       Neuro/Psych  Headaches, neg Seizures Weakness in both legs  negative psych ROS   GI/Hepatic hiatal hernia, GERD  ,Fatty Liver   Endo/Other  neg diabetesHypothyroidism   Renal/GU CRFRenal disease     Musculoskeletal negative musculoskeletal ROS (+)   Abdominal (+) - obese,   Peds  Hematology  (+) Blood dyscrasia, anemia ,   Anesthesia Other Findings Cataracts, bilateral Chronic kidney disease     Comment:  Followed by Dr. Holley Raring Chronic kidney disease Colon polyp Constipation due to slow transit GERD (gastroesophageal reflux disease) Heart murmur History of cyst of breast History of hiatal hernia Hyperlipidemia Hypertension Hypothyroidism Melanoma of lower leg ([place]) Shingles Spinal stenosis in cervical region     Comment:  Dr. Sharlet Salina Subdural hematoma [facility]) Thyroid disease   Reproductive/Obstetrics                            Anesthesia Physical  Anesthesia Plan  ASA: 2  Anesthesia Plan: General   Post-op Pain Management:    Induction: Intravenous  PONV Risk Score and Plan: 2 and Propofol infusion and TIVA  Airway Management Planned: Natural Airway and Nasal Cannula  Additional Equipment:   Intra-op Plan:   Post-operative Plan:   Informed Consent: I have reviewed the patients History and Physical, chart, labs and discussed the procedure including the risks, benefits and alternatives for the proposed anesthesia with the patient or authorized representative who has indicated his/her understanding and acceptance.     Dental advisory given  Plan Discussed with: CRNA and Anesthesiologist  Anesthesia Plan Comments:        Anesthesia Quick Evaluation

## 2021-10-30 ENCOUNTER — Encounter: Payer: Self-pay | Admitting: Internal Medicine

## 2021-10-30 NOTE — Anesthesia Postprocedure Evaluation (Signed)
Anesthesia Post Note  Patient: Caitlin Lin  Procedure(s) Performed: COLONOSCOPY  Patient location during evaluation: PACU Anesthesia Type: General Level of consciousness: awake and alert Pain management: pain level controlled Vital Signs Assessment: post-procedure vital signs reviewed and stable Respiratory status: spontaneous breathing, nonlabored ventilation and respiratory function stable Cardiovascular status: blood pressure returned to baseline and stable Postop Assessment: no apparent nausea or vomiting Anesthetic complications: no   No notable events documented.   Last Vitals:  Vitals:   10/29/21 1356 10/29/21 1406  BP: 126/65 (!) 143/66  Pulse: 77 (!) 55  Resp: 17 19  Temp: (!) 36 C   SpO2: 100% 100%    Last Pain:  Vitals:   10/30/21 0722  TempSrc:   PainSc: 0-No pain                 [place]

## 2021-10-31 ENCOUNTER — Encounter: Payer: Self-pay | Admitting: Cardiology

## 2021-10-31 ENCOUNTER — Ambulatory Visit: Payer: PPO | Admitting: Cardiology

## 2021-10-31 VITALS — BP 118/72 | HR 66 | Ht 64.0 in | Wt 155.6 lb

## 2021-10-31 DIAGNOSIS — E7800 Pure hypercholesterolemia, unspecified: Secondary | ICD-10-CM | POA: Diagnosis not present

## 2021-10-31 DIAGNOSIS — I2510 Atherosclerotic heart disease of native coronary artery without angina pectoris: Secondary | ICD-10-CM

## 2021-10-31 DIAGNOSIS — I10 Essential (primary) hypertension: Secondary | ICD-10-CM | POA: Diagnosis not present

## 2021-10-31 DIAGNOSIS — I2584 Coronary atherosclerosis due to calcified coronary lesion: Secondary | ICD-10-CM | POA: Diagnosis not present

## 2021-10-31 NOTE — Progress Notes (Signed)
Cardiology Office Note:    Date:  10/31/2021   ID:  Caitlin Lin, DOB 02-27-1941, MRN [id number]  PCP:  Einar Pheasant, MD  [facility] Cardiologist:  Kate Sable, MD  [facility] Electrophysiologist:  None   Referring MD: Einar Pheasant, MD   Chief Complaint  Patient presents with   12 month follow up    No new cardiac concerns    History of Present Illness:    Caitlin Lin is a 81 y.o. female with a hx of coronary calcifications, hypertension, hyperlipidemia, mild aortic regurgitation, coronary calcifications, who presents for follow-up.   Seen for hypertension and coronary calcifications.  Denies chest pain, previous Myoview showed no ischemia.  Tolerating medications including aspirin, Lipitor with no adverse effects.  BP adequately controlled.  Feels well, has no concerns at this time.  Prior notes Echo 12/2018 EF 60 to 65% Myoview 03/2020 low risk scan, no evidence for ischemia Chest CT without contrast 08/2019, coronary artery calcifications.  Past Medical History:  Diagnosis Date   Cataracts, bilateral    Chronic kidney disease    Followed by Dr. Holley Raring   Chronic kidney disease    Colon polyp    Constipation due to slow transit    Diarrhea    Diarrhea    GERD (gastroesophageal reflux disease)    Heart murmur    History of cyst of breast    History of hiatal hernia    Hyperlipidemia    Hypertension    Hypothyroidism    Melanoma of lower leg ([place]) 2013   Shingles 2013   Shingles    Spinal stenosis in cervical region    Dr. Sharlet Salina   Subdural hematoma [facility])    Thyroid disease     Past Surgical History:  Procedure Laterality Date   ABDOMINAL HYSTERECTOMY  1974   ANTERIOR AND POSTERIOR VAGINAL REPAIR     ANTERIOR AND POSTERIOR VAGINAL REPAIR W/ SACROSPINOUS LIGAMENT SUSPENSION Right    BREAST BIOPSY  1963   Benign per pt's report   COLONOSCOPY     COLONOSCOPY N/A 10/29/2021   Procedure: COLONOSCOPY;  Surgeon: Toledo, Benay Pike, MD;   Location: [facility] ENDOSCOPY;  Service: Gastroenterology;  Laterality: N/A;   ELECTROMAGNETIC NAVIGATION BROCHOSCOPY Right 01/09/2019   Procedure: ELECTROMAGNETIC NAVIGATION BRONCHOSCOPY;  Surgeon: Tyler Pita, MD;  Location: [facility] ORS;  Service: Cardiopulmonary;  Laterality: Right;   ESOPHAGOGASTRODUODENOSCOPY     ESOPHAGOGASTRODUODENOSCOPY (EGD) WITH PROPOFOL N/A 11/10/2016   Procedure: ESOPHAGOGASTRODUODENOSCOPY (EGD) WITH PROPOFOL;  Surgeon: Lollie Sails, MD;  Location: [facility] ENDOSCOPY;  Service: Endoscopy;  Laterality: N/A;   ESOPHAGOGASTRODUODENOSCOPY (EGD) WITH PROPOFOL N/A 09/09/2018   Procedure: ESOPHAGOGASTRODUODENOSCOPY (EGD) WITH PROPOFOL;  Surgeon: Lollie Sails, MD;  Location: [facility] ENDOSCOPY;  Service: Endoscopy;  Laterality: N/A;   ESOPHAGOGASTRODUODENOSCOPY (EGD) WITH PROPOFOL N/A 10/27/2019   Procedure: ESOPHAGOGASTRODUODENOSCOPY (EGD) WITH PROPOFOL;  Surgeon: Robert Bellow, MD;  Location: [facility] ENDOSCOPY;  Service: Endoscopy;  Laterality: N/A;   ESOPHAGOGASTRODUODENOSCOPY (EGD) WITH PROPOFOL N/A 08/09/2020   Procedure: ESOPHAGOGASTRODUODENOSCOPY (EGD) WITH PROPOFOL;  Surgeon: Lesly Rubenstein, MD;  Location: [facility] ENDOSCOPY;  Service: Endoscopy;  Laterality: N/A;   LUNG BIOPSY  2007   Benign per pt's report   LUNG BIOPSY Right    OOPHORECTOMY  1970   PERINEOPLASTY  12/17/2013   sling for stress incontinence  12/18/2011    Current Medications: Current Meds  Medication Sig   Alpha-Lipoic Acid 200 MG TABS Take 200 mg by mouth 3 (three) times daily.  amLODipine (NORVASC) 5 MG tablet Take 1 tablet by mouth once daily   aspirin 81 MG tablet Take 1 tablet (81 mg total) by mouth daily.   atorvastatin (LIPITOR) 20 MG tablet Take 1 tablet (20 mg total) by mouth daily.   Cholecalciferol 25 MCG (1000 UT) tablet Take by mouth.   fluticasone (FLONASE) 50 MCG/ACT nasal spray    Glucosamine-Chondroit-Vit C-Mn (GLUCOSAMINE CHONDR 500 COMPLEX PO) Take 2 tablets by mouth  daily.   levothyroxine (EUTHYROX) 75 MCG tablet Take 1 tablet (75 mcg total) by mouth daily with breakfast.   losartan (COZAAR) 50 MG tablet Take 1 tablet (50 mg total) by mouth daily.   Multiple Vitamin (MULTIVITAMIN) tablet Take 1 tablet by mouth daily.   Omega-3 Fatty Acids (FISH OIL) 1000 MG CAPS Take 1,000 mg by mouth daily.   pantoprazole (PROTONIX) 40 MG tablet One tablet 30 minutes before breakfast and one tablet 30 minutes before your evening meal - prn.   Probiotic Product (PROBIOTIC DAILY PO) Take by mouth daily.     Allergies:   Quinapril and Codeine   Social History   Socioeconomic History   Marital status: Married    Spouse name: Not on file   Number of children: 2   Years of education: Not on file   Highest education level: Not on file  Occupational History   Occupation: Retired  Tobacco Use   Smoking status: Never   Smokeless tobacco: Never  Vaping Use   Vaping Use: Never used  Substance and Sexual Activity   Alcohol use: No   Drug use: No   Sexual activity: Yes  Other Topics Concern   Not on file  Social History Narrative   Lives in [place] with husband. Has 2 children boy and girl, 4 grandchildren. Retired Presenter, broadcasting.       Daily Caffeine Use:  NO   Regular Exercise -  Not at this time due to back pain, would like to resume soon   Social Determinants of Health   Financial Resource Strain: Low Risk  (11/25/2020)   Overall Financial Resource Strain (CARDIA)    Difficulty of Paying Living Expenses: Not hard at all  Food Insecurity: No Food Insecurity (11/25/2020)   Hunger Vital Sign    Worried About Running Out of Food in the Last Year: Never true    Ran Out of Food in the Last Year: Never true  Transportation Needs: No Transportation Needs (11/25/2020)   PRAPARE - Hydrologist (Medical): No    Lack of Transportation (Non-Medical): No  Physical Activity: Inactive (09/17/2017)   Exercise Vital Sign    Days of Exercise per  Week: 0 days    Minutes of Exercise per Session: 0 min  Stress: No Stress Concern Present (11/25/2020)   [place]    Feeling of Stress : Not at all  Social Connections: [place] (11/25/2020)   Social Connection and Isolation Panel [NHANES]    Frequency of Communication with Friends and Family: Three times a week    Frequency of Social Gatherings with Friends and Family: Once a week    Attends Religious Services: More than 4 times per year    Active Member of [employer] or Organizations: Yes    Attends Music therapist: More than 4 times per year    Marital Status: Married     Family History: The patient's family history includes Arthritis in her mother; Breast  cancer in her cousin and another family member; Cirrhosis in her mother; Coronary artery disease in her father; Depression in her brother; Diabetes in her maternal aunt; Heart attack in her father; Heart disease in her father; Liver cancer in her cousin and mother; Liver disease in her mother.  ROS:   Please see the history of present illness.     All other systems reviewed and are negative.  EKGs/Labs/Other Studies Reviewed:    The following studies were reviewed today:   EKG:  EKG is  ordered today.  The ekg ordered today demonstrates sinus rhythm  Recent Labs: 09/03/2021: ALT 16; BUN 26; Creatinine, Ser 1.65; Hemoglobin 12.5; Platelets 165.0; Potassium 5.1; Sodium 141; TSH 1.52  Recent Lipid Panel    Component Value Date/Time   CHOL 163 09/03/2021 0857   TRIG 222.0 (H) 09/03/2021 0857   HDL 38.00 (L) 09/03/2021 0857   CHOLHDL 4 09/03/2021 0857   VLDL 44.4 (H) 09/03/2021 0857   LDLCALC 75 09/20/2020 0933   LDLDIRECT 83.0 09/03/2021 0857     Risk Assessment/Calculations:      Physical Exam:    VS:  BP 118/72 (BP Location: Left Arm, Patient Position: Sitting, Cuff Size: Normal)   Pulse 66   Ht 5\' 4"  (1.626 m)   Wt 155 lb 9.6  oz (70.6 kg)   SpO2 98%   BMI 26.71 kg/m     Wt Readings from Last 3 Encounters:  10/31/21 155 lb 9.6 oz (70.6 kg)  10/29/21 150 lb (68 kg)  09/05/21 156 lb 3.2 oz (70.9 kg)     GEN:  Well nourished, well developed in no acute distress HEENT: Normal NECK: No JVD; No carotid bruits CARDIAC: RRR, no murmurs, rubs, gallops RESPIRATORY:  Clear to auscultation without rales, wheezing or rhonchi  ABDOMEN: Soft, non-tender, non-distended MUSCULOSKELETAL:  No edema; No deformity  SKIN: Warm and dry NEUROLOGIC:  Alert and oriented x 3 PSYCHIATRIC:  Normal affect   ASSESSMENT:    1. Coronary artery calcification   2. Primary hypertension   3. Pure hypercholesterolemia    PLAN:    In order of problems listed above:  Coronary artery calcification, no chest pain, Myoview with no ischemia, echo with normal EF.  Continue aspirin, statin.   Hypertension, BP controlled. Continue losartan, amlodipine Hyperlipidemia, continue Lipitor.  Follow-up yearly.  Medication Adjustments/Labs and Tests Ordered: Current medicines are reviewed at length with the patient today.  Concerns regarding medicines are outlined above.  Orders Placed This Encounter  Procedures   EKG 12-Lead    No orders of the defined types were placed in this encounter.    Patient Instructions  Medication Instructions:   Your physician recommends that you continue on your current medications as directed. Please refer to the Current Medication list given to you today.   *If you need a refill on your cardiac medications before your next appointment, please call your pharmacy*  Follow-Up: At [facility], you and your health needs are our priority.  As part of our continuing mission to provide you with exceptional heart care, we have created designated Provider Care Teams.  These Care Teams include your primary Cardiologist (physician) and Advanced Practice Providers (APPs -  Physician Assistants and Nurse  Practitioners) who all work together to provide you with the care you need, when you need it.  We recommend signing up for the patient portal called "MyChart".  Sign up information is provided on this After Visit Summary.  MyChart is used to connect  with patients for Virtual Visits (Telemedicine).  Patients are able to view lab/test results, encounter notes, upcoming appointments, etc.  Non-urgent messages can be sent to your provider as well.   To learn more about what you can do with MyChart, go to [email or web address].    Your next appointment:   1 year(s)  The format for your next appointment:   In Person  Provider:   Kate Sable, MD    Other Instructions   Important Information About Sugar         Signed, Kate Sable, MD  10/31/2021 5:39 PM    [place]

## 2021-10-31 NOTE — Patient Instructions (Signed)
Medication Instructions:   Your physician recommends that you continue on your current medications as directed. Please refer to the Current Medication list given to you today.   *If you need a refill on your cardiac medications before your next appointment, please call your pharmacy*  Follow-Up: At [facility], you and your health needs are our priority.  As part of our continuing mission to provide you with exceptional heart care, we have created designated Provider Care Teams.  These Care Teams include your primary Cardiologist (physician) and Advanced Practice Providers (APPs -  Physician Assistants and Nurse Practitioners) who all work together to provide you with the care you need, when you need it.  We recommend signing up for the patient portal called "MyChart".  Sign up information is provided on this After Visit Summary.  MyChart is used to connect with patients for Virtual Visits (Telemedicine).  Patients are able to view lab/test results, encounter notes, upcoming appointments, etc.  Non-urgent messages can be sent to your provider as well.   To learn more about what you can do with MyChart, go to [email or web address].    Your next appointment:   1 year(s)  The format for your next appointment:   In Person  Provider:   Kate Sable, MD    Other Instructions   Important Information About Sugar

## 2021-11-02 ENCOUNTER — Other Ambulatory Visit: Payer: Self-pay | Admitting: Internal Medicine

## 2021-11-05 ENCOUNTER — Ambulatory Visit (INDEPENDENT_AMBULATORY_CARE_PROVIDER_SITE_OTHER): Payer: PPO | Admitting: Internal Medicine

## 2021-11-05 ENCOUNTER — Encounter: Payer: Self-pay | Admitting: Internal Medicine

## 2021-11-05 DIAGNOSIS — E039 Hypothyroidism, unspecified: Secondary | ICD-10-CM | POA: Diagnosis not present

## 2021-11-05 DIAGNOSIS — E7800 Pure hypercholesterolemia, unspecified: Secondary | ICD-10-CM | POA: Diagnosis not present

## 2021-11-05 DIAGNOSIS — F439 Reaction to severe stress, unspecified: Secondary | ICD-10-CM

## 2021-11-05 DIAGNOSIS — D509 Iron deficiency anemia, unspecified: Secondary | ICD-10-CM | POA: Diagnosis not present

## 2021-11-05 DIAGNOSIS — K760 Fatty (change of) liver, not elsewhere classified: Secondary | ICD-10-CM | POA: Diagnosis not present

## 2021-11-05 DIAGNOSIS — D696 Thrombocytopenia, unspecified: Secondary | ICD-10-CM | POA: Diagnosis not present

## 2021-11-05 DIAGNOSIS — K219 Gastro-esophageal reflux disease without esophagitis: Secondary | ICD-10-CM

## 2021-11-05 DIAGNOSIS — R739 Hyperglycemia, unspecified: Secondary | ICD-10-CM

## 2021-11-05 DIAGNOSIS — N184 Chronic kidney disease, stage 4 (severe): Secondary | ICD-10-CM

## 2021-11-05 DIAGNOSIS — I10 Essential (primary) hypertension: Secondary | ICD-10-CM

## 2021-11-05 DIAGNOSIS — I700 Atherosclerosis of aorta: Secondary | ICD-10-CM | POA: Diagnosis not present

## 2021-11-05 NOTE — Progress Notes (Signed)
Patient ID: Caitlin Lin, female   DOB: [birth year], 81 y.o.   MRN: [id number]   Subjective:    Patient ID: Caitlin Lin, female    DOB: 1941/01/20, 81 y.o.   MRN: [id number]   Patient here for a scheduled follow up.  Marland Kitchen   HPI Here to follow up regarding her blood pressure and cholesterol.  Also, intermittent flares of abdominal discomfort and nausea.  Seeing GI.  GES - ok.  Had colonoscopy 10/29/21 - internal hemorrhoids that prolapse with straining, but require manual replacement into the anal canal (Grade III) found on perianal exam. Non-bleeding internal hemorrhoids. Moderate diverticulosis in the sigmoid colon. Still with persistent symptoms.  Some nausea - intermittent - may occur every 2-3 days.  Taking benefiber, stool softeners and miralax to help regulate her bowels.  Does not eat as much.  Saw cardiology 10/31/21 - stable.  Continue aspirin and statin medication.  No chest pain.  Breathing stable.     Past Medical History:  Diagnosis Date   Cataracts, bilateral    Chronic kidney disease    Followed by Dr. Holley Raring   Chronic kidney disease    Colon polyp    Constipation due to slow transit    Diarrhea    Diarrhea    GERD (gastroesophageal reflux disease)    Heart murmur    History of cyst of breast    History of hiatal hernia    Hyperlipidemia    Hypertension    Hypothyroidism    Melanoma of lower leg ([place]) 2013   Shingles 2013   Shingles    Spinal stenosis in cervical region    Dr. Sharlet Salina   Subdural hematoma [facility])    Thyroid disease    Past Surgical History:  Procedure Laterality Date   ABDOMINAL HYSTERECTOMY  1974   ANTERIOR AND POSTERIOR VAGINAL REPAIR     ANTERIOR AND POSTERIOR VAGINAL REPAIR W/ SACROSPINOUS LIGAMENT SUSPENSION Right    BREAST BIOPSY  1963   Benign per pt's report   BREAST EXCISIONAL BIOPSY Right 1963   neg   COLONOSCOPY     COLONOSCOPY N/A 10/29/2021   Procedure: COLONOSCOPY;  Surgeon: Toledo, Benay Pike, MD;  Location: [facility] ENDOSCOPY;   Service: Gastroenterology;  Laterality: N/A;   ELECTROMAGNETIC NAVIGATION BROCHOSCOPY Right 01/09/2019   Procedure: ELECTROMAGNETIC NAVIGATION BRONCHOSCOPY;  Surgeon: Tyler Pita, MD;  Location: [facility] ORS;  Service: Cardiopulmonary;  Laterality: Right;   ESOPHAGOGASTRODUODENOSCOPY     ESOPHAGOGASTRODUODENOSCOPY (EGD) WITH PROPOFOL N/A 11/10/2016   Procedure: ESOPHAGOGASTRODUODENOSCOPY (EGD) WITH PROPOFOL;  Surgeon: Lollie Sails, MD;  Location: [facility] ENDOSCOPY;  Service: Endoscopy;  Laterality: N/A;   ESOPHAGOGASTRODUODENOSCOPY (EGD) WITH PROPOFOL N/A 09/09/2018   Procedure: ESOPHAGOGASTRODUODENOSCOPY (EGD) WITH PROPOFOL;  Surgeon: Lollie Sails, MD;  Location: [facility] ENDOSCOPY;  Service: Endoscopy;  Laterality: N/A;   ESOPHAGOGASTRODUODENOSCOPY (EGD) WITH PROPOFOL N/A 10/27/2019   Procedure: ESOPHAGOGASTRODUODENOSCOPY (EGD) WITH PROPOFOL;  Surgeon: Robert Bellow, MD;  Location: [facility] ENDOSCOPY;  Service: Endoscopy;  Laterality: N/A;   ESOPHAGOGASTRODUODENOSCOPY (EGD) WITH PROPOFOL N/A 08/09/2020   Procedure: ESOPHAGOGASTRODUODENOSCOPY (EGD) WITH PROPOFOL;  Surgeon: Lesly Rubenstein, MD;  Location: [facility] ENDOSCOPY;  Service: Endoscopy;  Laterality: N/A;   LUNG BIOPSY  2007   Benign per pt's report   LUNG BIOPSY Right    OOPHORECTOMY  1970   PERINEOPLASTY  12/17/2013   sling for stress incontinence  12/18/2011   Family History  Problem Relation Age of Onset   Arthritis Mother    Liver disease  Mother    Cirrhosis Mother    Liver cancer Mother    Heart disease Father    Heart attack Father    Coronary artery disease Father    Depression Brother    Breast cancer Other    Breast cancer Cousin        maternal cousins   Liver cancer Cousin    Diabetes Maternal Aunt    Social History   Socioeconomic History   Marital status: Married    Spouse name: Not on file   Number of children: 2   Years of education: Not on file   Highest education level: Not on file   Occupational History   Occupation: Retired  Tobacco Use   Smoking status: Never   Smokeless tobacco: Never  Vaping Use   Vaping Use: Never used  Substance and Sexual Activity   Alcohol use: No   Drug use: No   Sexual activity: Yes  Other Topics Concern   Not on file  Social History Narrative   Lives in [place] with husband. Has 2 children boy and girl, 4 grandchildren. Retired Presenter, broadcasting.       Daily Caffeine Use:  NO   Regular Exercise -  Not at this time due to back pain, would like to resume soon   Social Determinants of Health   Financial Resource Strain: Low Risk  (11/25/2020)   Overall Financial Resource Strain (CARDIA)    Difficulty of Paying Living Expenses: Not hard at all  Food Insecurity: No Food Insecurity (11/25/2020)   Hunger Vital Sign    Worried About Running Out of Food in the Last Year: Never true    Ran Out of Food in the Last Year: Never true  Transportation Needs: No Transportation Needs (11/25/2020)   PRAPARE - Hydrologist (Medical): No    Lack of Transportation (Non-Medical): No  Physical Activity: Inactive (09/17/2017)   Exercise Vital Sign    Days of Exercise per Week: 0 days    Minutes of Exercise per Session: 0 min  Stress: No Stress Concern Present (11/25/2020)   [place]    Feeling of Stress : Not at all  Social Connections: [place] (11/25/2020)   Social Connection and Isolation Panel [NHANES]    Frequency of Communication with Friends and Family: Three times a week    Frequency of Social Gatherings with Friends and Family: Once a week    Attends Religious Services: More than 4 times per year    Active Member of [employer] or Organizations: Yes    Attends Music therapist: More than 4 times per year    Marital Status: Married     Review of Systems  Constitutional:  Negative for unexpected weight change.       Decreased  appetite.    HENT:  Negative for congestion and sneezing.   Respiratory:  Negative for cough, chest tightness and shortness of breath.   Cardiovascular:  Negative for chest pain, palpitations and leg swelling.  Gastrointestinal:  Positive for nausea. Negative for vomiting.       GI issues as outlined   Genitourinary:  Negative for difficulty urinating and dysuria.  Musculoskeletal:  Negative for joint swelling and myalgias.  Skin:  Negative for color change and rash.  Neurological:  Negative for dizziness, light-headedness and headaches.  Psychiatric/Behavioral:  Negative for agitation and dysphoric mood.        Objective:  BP 134/78   Pulse 74   Temp 98.6 F (37 C) (Temporal)   Resp 18   Ht '5\' 4"'  (1.626 m)   Wt 151 lb 6.4 oz (68.7 kg)   SpO2 97%   BMI 25.99 kg/m  Wt Readings from Last 3 Encounters:  11/05/21 151 lb 6.4 oz (68.7 kg)  10/31/21 155 lb 9.6 oz (70.6 kg)  10/29/21 150 lb (68 kg)    Physical Exam Vitals reviewed.  Constitutional:      General: She is not in acute distress.    Appearance: Normal appearance.  HENT:     Head: Normocephalic and atraumatic.     Right Ear: External ear normal.     Left Ear: External ear normal.  Eyes:     General: No scleral icterus.       Right eye: No discharge.        Left eye: No discharge.     Conjunctiva/sclera: Conjunctivae normal.  Neck:     Thyroid: No thyromegaly.  Cardiovascular:     Rate and Rhythm: Normal rate and regular rhythm.  Pulmonary:     Effort: No respiratory distress.     Breath sounds: Normal breath sounds. No wheezing.  Abdominal:     General: Bowel sounds are normal.     Palpations: Abdomen is soft.     Tenderness: There is no abdominal tenderness.  Musculoskeletal:        General: No swelling or tenderness.     Cervical back: Neck supple. No tenderness.  Lymphadenopathy:     Cervical: No cervical adenopathy.  Skin:    Findings: No erythema or rash.  Neurological:     Mental Status:  She is alert.  Psychiatric:        Mood and Affect: Mood normal.        Behavior: Behavior normal.      Outpatient Encounter Medications as of 11/05/2021  Medication Sig   Alpha-Lipoic Acid 200 MG TABS Take 200 mg by mouth 3 (three) times daily.   amLODipine (NORVASC) 5 MG tablet Take 1 tablet by mouth once daily   aspirin 81 MG tablet Take 1 tablet (81 mg total) by mouth daily.   atorvastatin (LIPITOR) 20 MG tablet Take 1 tablet (20 mg total) by mouth daily.   Cholecalciferol 25 MCG (1000 UT) tablet Take by mouth.   Docusate Calcium (STOOL SOFTENER PO) Take by mouth.   fluticasone (FLONASE) 50 MCG/ACT nasal spray    Glucosamine-Chondroit-Vit C-Mn (GLUCOSAMINE CHONDR 500 COMPLEX PO) Take 2 tablets by mouth daily.   levothyroxine (SYNTHROID) 75 MCG tablet TAKE 1 TABLET BY MOUTH ONCE DAILY IN THE MORNING BEFORE BREAKFAST   losartan (COZAAR) 50 MG tablet Take 1 tablet (50 mg total) by mouth daily.   Multiple Vitamin (MULTIVITAMIN) tablet Take 1 tablet by mouth daily.   Omega-3 Fatty Acids (FISH OIL) 1000 MG CAPS Take 1,000 mg by mouth daily.   pantoprazole (PROTONIX) 40 MG tablet One tablet 30 minutes before breakfast and one tablet 30 minutes before your evening meal - prn.   Polyethylene Glycol 3350 (MIRALAX PO) Take by mouth as needed.   Probiotic Product (PROBIOTIC DAILY PO) Take by mouth daily.   sertraline (ZOLOFT) 50 MG tablet Take 1 tablet by mouth once daily   Wheat Dextrin (BENEFIBER PO) Take by mouth.   No facility-administered encounter medications on file as of 11/05/2021.     Lab Results  Component Value Date   WBC 3.7 (L) 09/03/2021  HGB 12.5 09/03/2021   HCT 37.7 09/03/2021   PLT 165.0 09/03/2021   GLUCOSE 87 09/03/2021   CHOL 163 09/03/2021   TRIG 222.0 (H) 09/03/2021   HDL 38.00 (L) 09/03/2021   LDLDIRECT 83.0 09/03/2021   LDLCALC 75 09/20/2020   ALT 16 09/03/2021   AST 23 09/03/2021   NA 141 09/03/2021   K 5.1 09/03/2021   CL 106 09/03/2021   CREATININE  1.65 (H) 09/03/2021   BUN 26 (H) 09/03/2021   CO2 28 09/03/2021   TSH 1.52 09/03/2021   HGBA1C 5.8 09/03/2021   MICROALBUR 2.1 (H) 03/27/2015       Assessment & Plan:   Problem List Items Addressed This Visit     Anemia, iron deficiency    Follow cbc.       Aortic atherosclerosis (HCC)    Continue lipitor.       CKD (chronic kidney disease) stage 4, GFR 15-29 ml/min (HCC)    Followed by nephrology -  by Dr Percell Miller.  Evaluated 05/2021.  Losartan decreased to 36m q day.  Follow pressures.  Stable.  Continue to avoid antiinflammatories. Continue losartan.       Fatty liver    Diet and exercise.  Follow liver panel.       GERD (gastroesophageal reflux disease)    Intermittent nausea.  On protonix.  Keep bowels moving.       Relevant Medications   Wheat Dextrin (BENEFIBER PO)   Docusate Calcium (STOOL SOFTENER PO)   Polyethylene Glycol 3350 (MIRALAX PO)   Hypercholesterolemia    Continue lipitor.  Low cholesterol diet and exercise.  Follow lipid panel and liver function tests.        Hyperglycemia    Low carb diet and exercise.  Follow met b and a1c.        Stress    On zoloft and doing well on this medication.  Continue zoloft.  Follow.       [place]    Follow cbc.       Hypertension (Chronic)    Blood pressure as outlined. Continue amlodipine and losartan.  Follow pressures.  Follow metabolic panel.       Hypothyroidism (Chronic)    On thyroid replacement.  Follow tsh.         CEinar Pheasant MD

## 2021-11-07 ENCOUNTER — Ambulatory Visit
Admission: RE | Admit: 2021-11-07 | Discharge: 2021-11-07 | Disposition: A | Discharge status: Home / Self Care | Payer: PPO | Source: Ambulatory Visit | Attending: Internal Medicine | Admitting: Internal Medicine

## 2021-11-07 DIAGNOSIS — Z1231 Encounter for screening mammogram for malignant neoplasm of breast: Secondary | ICD-10-CM | POA: Insufficient documentation

## 2021-11-10 ENCOUNTER — Other Ambulatory Visit: Payer: Self-pay | Admitting: Internal Medicine

## 2021-11-10 DIAGNOSIS — N6489 Other specified disorders of breast: Secondary | ICD-10-CM

## 2021-11-10 DIAGNOSIS — R928 Other abnormal and inconclusive findings on diagnostic imaging of breast: Secondary | ICD-10-CM

## 2021-11-14 ENCOUNTER — Ambulatory Visit
Admission: RE | Admit: 2021-11-14 | Discharge: 2021-11-14 | Disposition: A | Discharge status: Home / Self Care | Payer: PPO | Source: Ambulatory Visit | Attending: Internal Medicine | Admitting: Internal Medicine

## 2021-11-14 DIAGNOSIS — N6489 Other specified disorders of breast: Secondary | ICD-10-CM

## 2021-11-14 DIAGNOSIS — R928 Other abnormal and inconclusive findings on diagnostic imaging of breast: Secondary | ICD-10-CM

## 2021-11-16 ENCOUNTER — Encounter: Payer: Self-pay | Admitting: *Deleted

## 2021-11-17 ENCOUNTER — Encounter: Payer: Self-pay | Admitting: Internal Medicine

## 2021-11-17 NOTE — Assessment & Plan Note (Signed)
On zoloft and doing well on this medication.  Continue zoloft.  Follow.

## 2021-11-17 NOTE — Assessment & Plan Note (Signed)
Follow cbc.  

## 2021-11-17 NOTE — Assessment & Plan Note (Signed)
Continue lipitor  ?

## 2021-11-17 NOTE — Assessment & Plan Note (Signed)
Diet and exercise.  Follow liver panel.

## 2021-11-17 NOTE — Assessment & Plan Note (Signed)
Continue lipitor.  Low cholesterol diet and exercise.  Follow lipid panel and liver function tests.

## 2021-11-17 NOTE — Assessment & Plan Note (Signed)
Followed by nephrology -  by Dr Percell Miller.  Evaluated 05/2021.  Losartan decreased to 50mg  q day.  Follow pressures.  Stable.  Continue to avoid antiinflammatories. Continue losartan.

## 2021-11-17 NOTE — Assessment & Plan Note (Signed)
Blood pressure as outlined. Continue amlodipine and losartan.  Follow pressures.  Follow metabolic panel.

## 2021-11-17 NOTE — Assessment & Plan Note (Signed)
On thyroid replacement.  Follow tsh.  

## 2021-11-17 NOTE — Assessment & Plan Note (Signed)
Low carb diet and exercise.  Follow met b and a1c.   

## 2021-11-17 NOTE — Assessment & Plan Note (Signed)
Intermittent nausea.  On protonix.  Keep bowels moving.

## 2021-11-23 ENCOUNTER — Other Ambulatory Visit: Payer: Self-pay | Admitting: Internal Medicine

## 2021-11-26 ENCOUNTER — Ambulatory Visit (INDEPENDENT_AMBULATORY_CARE_PROVIDER_SITE_OTHER): Payer: PPO

## 2021-11-26 VITALS — Ht 64.0 in | Wt 151.0 lb

## 2021-11-26 DIAGNOSIS — Z0000 Encounter for general adult medical examination without abnormal findings: Secondary | ICD-10-CM

## 2021-11-26 NOTE — Progress Notes (Signed)
Subjective:   Caitlin Lin is a 81 y.o. female who presents for Medicare Annual (Subsequent) preventive examination.  Review of Systems    No ROS.  Medicare Wellness Virtual Visit.  Visual/audio telehealth visit, UTA vital signs.   See social history for additional risk factors.   Cardiac Risk Factors include: advanced age (>98men, >60 women);hypertension     Objective:    Today's Vitals   11/26/21 0947  Weight: 151 lb (68.5 kg)  Height: 5\' 4"  (1.626 m)   Body mass index is 25.92 kg/m.     11/26/2021   10:18 AM 10/29/2021   12:20 PM 09/22/2021   10:28 AM 11/27/2020   12:12 PM 11/25/2020    8:21 AM 08/09/2020    7:34 AM 02/19/2020   11:53 AM  Advanced Directives  Does Patient Have a Medical Advance Directive? Yes No;Yes No Yes Yes Yes No  Type of Paramedic of [place];Living will Living will  [place];Living will [place];Living will [place];Living will   Does patient want to make changes to medical advance directive? No - Patient declined    No - Patient declined    Copy of [place] in Chart? Yes - validated most recent copy scanned in chart (See row information)    No - copy requested    Would patient like information on creating a medical advance directive?   No - Patient declined        Current Medications (verified) Outpatient Encounter Medications as of 11/26/2021  Medication Sig   Alpha-Lipoic Acid 200 MG TABS Take 200 mg by mouth 3 (three) times daily.   amLODipine (NORVASC) 5 MG tablet Take 1 tablet by mouth once daily   aspirin 81 MG tablet Take 1 tablet (81 mg total) by mouth daily.   atorvastatin (LIPITOR) 20 MG tablet Take 1 tablet (20 mg total) by mouth daily.   Cholecalciferol 25 MCG (1000 UT) tablet Take by mouth.   Docusate Calcium (STOOL SOFTENER PO) Take by mouth.   fluticasone (FLONASE) 50 MCG/ACT nasal spray    Glucosamine-Chondroit-Vit C-Mn  (GLUCOSAMINE CHONDR 500 COMPLEX PO) Take 2 tablets by mouth daily.   levothyroxine (SYNTHROID) 75 MCG tablet TAKE 1 TABLET BY MOUTH ONCE DAILY IN THE MORNING BEFORE BREAKFAST   losartan (COZAAR) 50 MG tablet Take 1 tablet (50 mg total) by mouth daily.   Multiple Vitamin (MULTIVITAMIN) tablet Take 1 tablet by mouth daily.   Omega-3 Fatty Acids (FISH OIL) 1000 MG CAPS Take 1,000 mg by mouth daily.   pantoprazole (PROTONIX) 40 MG tablet One tablet 30 minutes before breakfast and one tablet 30 minutes before your evening meal - prn.   Polyethylene Glycol 3350 (MIRALAX PO) Take by mouth as needed.   Probiotic Product (PROBIOTIC DAILY PO) Take by mouth daily.   sertraline (ZOLOFT) 50 MG tablet Take 1 tablet by mouth once daily   Wheat Dextrin (BENEFIBER PO) Take by mouth.   No facility-administered encounter medications on file as of 11/26/2021.   Allergies (verified) Quinapril and Codeine   History: Past Medical History:  Diagnosis Date   Cataracts, bilateral    Chronic kidney disease    Followed by Dr. Holley Raring   Chronic kidney disease    Colon polyp    Constipation due to slow transit    Diarrhea    Diarrhea    GERD (gastroesophageal reflux disease)    Heart murmur    History of  cyst of breast    History of hiatal hernia    Hyperlipidemia    Hypertension    Hypothyroidism    Melanoma of lower leg ([place]) 2013   Shingles 2013   Shingles    Spinal stenosis in cervical region    Dr. Sharlet Salina   Subdural hematoma [facility])    Thyroid disease    Past Surgical History:  Procedure Laterality Date   ABDOMINAL HYSTERECTOMY  1974   ANTERIOR AND POSTERIOR VAGINAL REPAIR     ANTERIOR AND POSTERIOR VAGINAL REPAIR W/ SACROSPINOUS LIGAMENT SUSPENSION Right    BREAST BIOPSY  1963   Benign per pt's report   BREAST EXCISIONAL BIOPSY Right 1963   neg   COLONOSCOPY     COLONOSCOPY N/A 10/29/2021   Procedure: COLONOSCOPY;  Surgeon: Toledo, Benay Pike, MD;  Location: [facility] ENDOSCOPY;  Service:  Gastroenterology;  Laterality: N/A;   ELECTROMAGNETIC NAVIGATION BROCHOSCOPY Right 01/09/2019   Procedure: ELECTROMAGNETIC NAVIGATION BRONCHOSCOPY;  Surgeon: Tyler Pita, MD;  Location: [facility] ORS;  Service: Cardiopulmonary;  Laterality: Right;   ESOPHAGOGASTRODUODENOSCOPY     ESOPHAGOGASTRODUODENOSCOPY (EGD) WITH PROPOFOL N/A 11/10/2016   Procedure: ESOPHAGOGASTRODUODENOSCOPY (EGD) WITH PROPOFOL;  Surgeon: Lollie Sails, MD;  Location: [facility] ENDOSCOPY;  Service: Endoscopy;  Laterality: N/A;   ESOPHAGOGASTRODUODENOSCOPY (EGD) WITH PROPOFOL N/A 09/09/2018   Procedure: ESOPHAGOGASTRODUODENOSCOPY (EGD) WITH PROPOFOL;  Surgeon: Lollie Sails, MD;  Location: [facility] ENDOSCOPY;  Service: Endoscopy;  Laterality: N/A;   ESOPHAGOGASTRODUODENOSCOPY (EGD) WITH PROPOFOL N/A 10/27/2019   Procedure: ESOPHAGOGASTRODUODENOSCOPY (EGD) WITH PROPOFOL;  Surgeon: Robert Bellow, MD;  Location: [facility] ENDOSCOPY;  Service: Endoscopy;  Laterality: N/A;   ESOPHAGOGASTRODUODENOSCOPY (EGD) WITH PROPOFOL N/A 08/09/2020   Procedure: ESOPHAGOGASTRODUODENOSCOPY (EGD) WITH PROPOFOL;  Surgeon: Lesly Rubenstein, MD;  Location: [facility] ENDOSCOPY;  Service: Endoscopy;  Laterality: N/A;   LUNG BIOPSY  2007   Benign per pt's report   LUNG BIOPSY Right    OOPHORECTOMY  1970   PERINEOPLASTY  12/17/2013   sling for stress incontinence  12/18/2011   Family History  Problem Relation Age of Onset   Arthritis Mother    Liver disease Mother    Cirrhosis Mother    Liver cancer Mother    Heart disease Father    Heart attack Father    Coronary artery disease Father    Depression Brother    Prostate cancer Brother    Diabetes Maternal Aunt    Breast cancer Cousin        maternal cousins   Liver cancer Cousin    Breast cancer Other    Social History   Socioeconomic History   Marital status: Married    Spouse name: Not on file   Number of children: 2   Years of education: Not on file   Highest education level:  Not on file  Occupational History   Occupation: Retired  Tobacco Use   Smoking status: Never   Smokeless tobacco: Never  Vaping Use   Vaping Use: Never used  Substance and Sexual Activity   Alcohol use: No   Drug use: No   Sexual activity: Yes  Other Topics Concern   Not on file  Social History Narrative   Lives in [place] with husband. Has 2 children boy and girl, 4 grandchildren. Retired Presenter, broadcasting.       Daily Caffeine Use:  NO   Regular Exercise -  Not at this time due to back pain, would like to resume soon   Social Determinants of Health  Financial Resource Strain: Low Risk  (11/26/2021)   Overall Financial Resource Strain (CARDIA)    Difficulty of Paying Living Expenses: Not hard at all  Food Insecurity: No Food Insecurity (11/26/2021)   Hunger Vital Sign    Worried About Running Out of Food in the Last Year: Never true    Ran Out of Food in the Last Year: Never true  Transportation Needs: No Transportation Needs (11/25/2020)   PRAPARE - Hydrologist (Medical): No    Lack of Transportation (Non-Medical): No  Physical Activity: Insufficiently Active (11/26/2021)   Exercise Vital Sign    Days of Exercise per Week: 4 days    Minutes of Exercise per Session: 20 min  Stress: No Stress Concern Present (11/26/2021)   [place]    Feeling of Stress : Not at all  Social Connections: Unknown (11/26/2021)   Social Connection and Isolation Panel [NHANES]    Frequency of Communication with Friends and Family: Not on file    Frequency of Social Gatherings with Friends and Family: Not on file    Attends Religious Services: Not on file    Active Member of Clubs or Organizations: Not on file    Attends Archivist Meetings: Not on file    Marital Status: Married   Tobacco Counseling Counseling given: Not Answered  Clinical Intake: Pre-visit preparation completed: Yes         Diabetes: No  How often do you need to have someone help you when you read instructions, pamphlets, or other written materials from your doctor or pharmacy?: 1 - Never   Interpreter Needed?: No    Activities of Daily Living    11/26/2021    9:54 AM  In your present state of health, do you have any difficulty performing the following activities:  Hearing? 0  Vision? 0  Difficulty concentrating or making decisions? 0  Walking or climbing stairs? 0  Dressing or bathing? 0  Doing errands, shopping? 0  Preparing Food and eating ? N  Using the Toilet? N  In the past six months, have you accidently leaked urine? N  Do you have problems with loss of bowel control? N  Managing your Medications? N  Managing your Finances? N  Housekeeping or managing your Housekeeping? N    Patient Care Team: Einar Pheasant, MD as PCP - General (Internal Medicine) Kate Sable, MD as PCP - Cardiology (Cardiology) Anthonette Legato, MD (Internal Medicine) Ok Edwards, NP as Nurse Practitioner (Gastroenterology)  Indicate any recent Medical Services you may have received from other than [facility] providers in the past year (date may be approximate).     Assessment:   This is a routine wellness examination for [place].  Virtual Visit via Telephone Note  I connected with  [name] on 11/26/21 at  9:45 AM EDT by telephone and verified that I am speaking with the correct person using two identifiers.  Location: Patient: home  Provider: office Persons participating in the virtual visit: patient/Nurse Health Advisor   I discussed the limitations of performing an evaluation and management service by telehealth. We continued and completed visit with audio only. Some vital signs may be absent or patient reported.   Hearing/Vision screen Hearing Screening - Comments:: Patient is able to hear conversational tones without difficulty.  No issues reported.  Vision Screening -  Comments:: Followed by [facility] Cataract extraction, bilateral They have seen their ophthalmologist  in the last 12 months.    Dietary issues and exercise activities discussed: Current Exercise Habits: Home exercise routine, Type of exercise: walking, Time (Minutes): 20, Frequency (Times/Week): 4, Weekly Exercise (Minutes/Week): 80, Intensity: Mild Healthy diet Good water intake   Goals Addressed               This Visit's Progress     Patient Stated     Maintain Healthy Lifestyle (pt-stated)        Stay active Healthy diet Stay hydrated       Depression Screen    11/26/2021    9:53 AM 11/05/2021    9:31 AM 09/05/2021    9:05 AM 01/03/2021   10:00 AM 11/25/2020    8:20 AM 09/23/2020   11:21 AM 11/23/2019    8:35 AM  PHQ 2/9 Scores  PHQ - 2 Score 0 0 1 0 0 0 0    Fall Risk    11/26/2021    9:54 AM 11/05/2021    9:31 AM 09/05/2021    9:05 AM 01/03/2021   10:00 AM 11/25/2020    8:22 AM  Fall Risk   Falls in the past year? 0 0 0 1 0  Number falls in past yr: 0 0 0 1 0  Injury with Fall? 0 0 0 0 0  Risk for fall due to :  No Fall Risks History of fall(s) History of fall(s)   Follow up Falls evaluation completed Falls evaluation completed Falls evaluation completed Falls evaluation completed Falls evaluation completed    [place]: Home free of loose throw rugs in walkways, pet beds, electrical cords, etc? Yes  Adequate lighting in your home to reduce risk of falls? Yes   ASSISTIVE DEVICES UTILIZED TO PREVENT FALLS: Life alert? No  Use of a cane, walker or w/c? No   TIMED UP AND GO: Was the test performed? No .   Cognitive Function:    09/24/2014    9:38 AM 09/24/2014    9:36 AM  MMSE - Mini Mental State Exam  Orientation to time 5 5  Orientation to Place 5   Registration 3 3  Attention/ Calculation 5   Recall 3 3  Language- name 2 objects 2   Language- repeat 1 1  Language- follow 3 step command 3   Language-  read & follow direction 1 1  Write a sentence 1   Copy design 1 1  Total score 30         11/26/2021    9:56 AM 11/25/2020    8:24 AM 11/23/2019    8:41 AM 11/22/2018    8:52 AM 09/17/2017   10:38 AM  6CIT Screen  What Year? 0 points 0 points 0 points 0 points 0 points  What month? 0 points 0 points 0 points 0 points 0 points  What time? 0 points 0 points 0 points 0 points 0 points  Count back from 20 0 points 0 points  0 points 0 points  Months in reverse 0 points 0 points  0 points 0 points  Repeat phrase 0 points 0 points  0 points 0 points  Total Score 0 points 0 points  0 points 0 points    Immunizations Immunization History  Administered Date(s) Administered   Fluad Quad(high Dose 65+) 12/22/2019, 01/03/2021   Hepatitis A 07/14/2017, 08/17/2017   Hepatitis B 07/14/2017, 08/17/2017   Influenza Split 12/15/2010, 03/07/2012   Influenza, High Dose Seasonal PF  12/21/2016, 11/17/2017, 12/26/2018   Influenza,inj,Quad PF,6+ Mos 12/05/2013   Influenza-Unspecified 12/25/2014, 01/03/2016, 12/28/2016   PFIZER(Purple Top)SARS-COV-2 Vaccination 05/29/2019, 06/19/2019   Pneumococcal Conjugate-13 03/12/2014   Pneumococcal Polysaccharide-23 12/09/2005, 03/03/2013   Tdap 03/07/2012   Zoster, Live 12/09/2009   Screening Tests Health Maintenance  Topic Date Due   COVID-19 Vaccine (3 - Pfizer series) 12/12/2021 (Originally 08/14/2019)   Zoster Vaccines- Shingrix (1 of 2) 12/24/2021 (Originally 04/21/1991)   INFLUENZA VACCINE  06/14/2022 (Originally 10/14/2021)   TETANUS/TDAP  03/07/2022   MAMMOGRAM  11/08/2022   Pneumonia Vaccine 52+ Years old  Completed   DEXA SCAN  Completed   HPV VACCINES  Aged Out   Health Maintenance There are no preventive care reminders to display for this patient.  Lung Cancer Screening: (Low Dose CT Chest recommended if Age 20-80 years, 30 pack-year currently smoking OR have quit w/in 15years.) does not qualify.   Hepatitis C Screening: does not  qualify.  Vision Screening: Recommended annual ophthalmology exams for early detection of glaucoma and other disorders of the eye.  Dental Screening: Recommended annual dental exams for proper oral hygiene  Community Resource Referral / Chronic Care Management: CRR required this visit?  No   CCM required this visit?  No      Plan:     I have personally reviewed and noted the following in the patient's chart:   Medical and social history Use of alcohol, tobacco or illicit drugs  Current medications and supplements including opioid prescriptions. Patient is not currently taking opioid prescriptions. Functional ability and status Nutritional status Physical activity Advanced directives List of other physicians Hospitalizations, surgeries, and ER visits in previous 12 months Vitals Screenings to include cognitive, depression, and falls Referrals and appointments  In addition, I have reviewed and discussed with patient certain preventive protocols, quality metrics, and best practice recommendations. A written personalized care plan for preventive services as well as general preventive health recommendations were provided to patient.     Varney Biles, LPN   [id number]

## 2021-11-26 NOTE — Patient Instructions (Addendum)
Caitlin Lin , Thank you for taking time to come for your Medicare Wellness Visit. I appreciate your ongoing commitment to your health goals. Please review the following plan we discussed and let me know if I can assist you in the future.   These are the goals we discussed:  Goals       Patient Stated     Maintain Healthy Lifestyle (pt-stated)      Stay active Healthy diet Stay hydrated        This is a list of the screening recommended for you and due dates:  Health Maintenance  Topic Date Due   COVID-19 Vaccine (3 - Pfizer series) 12/12/2021*   Zoster (Shingles) Vaccine (1 of 2) 12/24/2021*   Flu Shot  06/14/2022*   Tetanus Vaccine  03/07/2022   Mammogram  11/08/2022   Pneumonia Vaccine  Completed   DEXA scan (bone density measurement)  Completed   HPV Vaccine  Aged Out  *Topic was postponed. The date shown is not the original due date.     Next appointment: Follow up in one year for your annual wellness visit    Preventive Care 65 Years and Older, Female Preventive care refers to lifestyle choices and visits with your health care provider that can promote health and wellness. What does preventive care include? A yearly physical exam. This is also called an annual well check. Dental exams once or twice a year. Routine eye exams. Ask your health care provider how often you should have your eyes checked. Personal lifestyle choices, including: Daily care of your teeth and gums. Regular physical activity. Eating a healthy diet. Avoiding tobacco and drug use. Limiting alcohol use. Practicing safe sex. Taking low-dose aspirin every day. Taking vitamin and mineral supplements as recommended by your health care provider. What happens during an annual well check? The services and screenings done by your health care provider during your annual well check will depend on your age, overall health, lifestyle risk factors, and family history of disease. Counseling  Your health  care provider may ask you questions about your: Alcohol use. Tobacco use. Drug use. Emotional well-being. Home and relationship well-being. Sexual activity. Eating habits. History of falls. Memory and ability to understand (cognition). Work and work Statistician. Reproductive health. Screening  You may have the following tests or measurements: Height, weight, and BMI. Blood pressure. Lipid and cholesterol levels. These may be checked every 5 years, or more frequently if you are over 31 years old. Skin check. Lung cancer screening. You may have this screening every year starting at age 57 if you have a 30-pack-year history of smoking and currently smoke or have quit within the past 15 years. Fecal occult blood test (FOBT) of the stool. You may have this test every year starting at age 66. Flexible sigmoidoscopy or colonoscopy. You may have a sigmoidoscopy every 5 years or a colonoscopy every 10 years starting at age 6. Hepatitis C blood test. Hepatitis B blood test. Sexually transmitted disease (STD) testing. Diabetes screening. This is done by checking your blood sugar (glucose) after you have not eaten for a while (fasting). You may have this done every 1-3 years. Bone density scan. This is done to screen for osteoporosis. You may have this done starting at age 18. Mammogram. This may be done every 1-2 years. Talk to your health care provider about how often you should have regular mammograms. Talk with your health care provider about your test results, treatment options, and if necessary, the  need for more tests. Vaccines  Your health care provider may recommend certain vaccines, such as: Influenza vaccine. This is recommended every year. Tetanus, diphtheria, and acellular pertussis (Tdap, Td) vaccine. You may need a Td booster every 10 years. Zoster vaccine. You may need this after age 7. Pneumococcal 13-valent conjugate (PCV13) vaccine. One dose is recommended after age  18. Pneumococcal polysaccharide (PPSV23) vaccine. One dose is recommended after age 61. Talk to your health care provider about which screenings and vaccines you need and how often you need them. This information is not intended to replace advice given to you by your health care provider. Make sure you discuss any questions you have with your health care provider. Document Released: 03/29/2015 Document Revised: 11/20/2015 Document Reviewed: 01/01/2015 Elsevier Interactive Patient Education  2017 [place] Prevention in the Home Falls can cause injuries. They can happen to people of all ages. There are many things you can do to make your home safe and to help prevent falls. What can I do on the outside of my home? Regularly fix the edges of walkways and driveways and fix any cracks. Remove anything that might make you trip as you walk through a door, such as a raised step or threshold. Trim any bushes or trees on the path to your home. Use bright outdoor lighting. Clear any walking paths of anything that might make someone trip, such as rocks or tools. Regularly check to see if handrails are loose or broken. Make sure that both sides of any steps have handrails. Any raised decks and porches should have guardrails on the edges. Have any leaves, snow, or ice cleared regularly. Use sand or salt on walking paths during winter. Clean up any spills in your garage right away. This includes oil or grease spills. What can I do in the bathroom? Use night lights. Install grab bars by the toilet and in the tub and shower. Do not use towel bars as grab bars. Use non-skid mats or decals in the tub or shower. If you need to sit down in the shower, use a plastic, non-slip stool. Keep the floor dry. Clean up any water that spills on the floor as soon as it happens. Remove soap buildup in the tub or shower regularly. Attach bath mats securely with double-sided non-slip rug tape. Do not have throw  rugs and other things on the floor that can make you trip. What can I do in the bedroom? Use night lights. Make sure that you have a light by your bed that is easy to reach. Do not use any sheets or blankets that are too big for your bed. They should not hang down onto the floor. Have a firm chair that has side arms. You can use this for support while you get dressed. Do not have throw rugs and other things on the floor that can make you trip. What can I do in the kitchen? Clean up any spills right away. Avoid walking on wet floors. Keep items that you use a lot in easy-to-reach places. If you need to reach something above you, use a strong step stool that has a grab bar. Keep electrical cords out of the way. Do not use floor polish or wax that makes floors slippery. If you must use wax, use non-skid floor wax. Do not have throw rugs and other things on the floor that can make you trip. What can I do with my stairs? Do not leave any items on the  stairs. Make sure that there are handrails on both sides of the stairs and use them. Fix handrails that are broken or loose. Make sure that handrails are as long as the stairways. Check any carpeting to make sure that it is firmly attached to the stairs. Fix any carpet that is loose or worn. Avoid having throw rugs at the top or bottom of the stairs. If you do have throw rugs, attach them to the floor with carpet tape. Make sure that you have a light switch at the top of the stairs and the bottom of the stairs. If you do not have them, ask someone to add them for you. What else can I do to help prevent falls? Wear shoes that: Do not have high heels. Have rubber bottoms. Are comfortable and fit you well. Are closed at the toe. Do not wear sandals. If you use a stepladder: Make sure that it is fully opened. Do not climb a closed stepladder. Make sure that both sides of the stepladder are locked into place. Ask someone to hold it for you, if  possible. Clearly mark and make sure that you can see: Any grab bars or handrails. First and last steps. Where the edge of each step is. Use tools that help you move around (mobility aids) if they are needed. These include: Canes. Walkers. Scooters. Crutches. Turn on the lights when you go into a dark area. Replace any light bulbs as soon as they burn out. Set up your furniture so you have a clear path. Avoid moving your furniture around. If any of your floors are uneven, fix them. If there are any pets around you, be aware of where they are. Review your medicines with your doctor. Some medicines can make you feel dizzy. This can increase your chance of falling. Ask your doctor what other things that you can do to help prevent falls. This information is not intended to replace advice given to you by your health care provider. Make sure you discuss any questions you have with your health care provider. Document Released: 12/27/2008 Document Revised: 08/08/2015 Document Reviewed: 04/06/2014 Elsevier Interactive Patient Education  2017 [employer].

## 2022-01-01 ENCOUNTER — Telehealth: Payer: Self-pay | Admitting: Internal Medicine

## 2022-01-01 DIAGNOSIS — E7800 Pure hypercholesterolemia, unspecified: Secondary | ICD-10-CM

## 2022-01-01 DIAGNOSIS — E875 Hyperkalemia: Secondary | ICD-10-CM

## 2022-01-01 DIAGNOSIS — D509 Iron deficiency anemia, unspecified: Secondary | ICD-10-CM

## 2022-01-01 DIAGNOSIS — R739 Hyperglycemia, unspecified: Secondary | ICD-10-CM

## 2022-01-01 DIAGNOSIS — R748 Abnormal levels of other serum enzymes: Secondary | ICD-10-CM

## 2022-01-01 DIAGNOSIS — N184 Chronic kidney disease, stage 4 (severe): Secondary | ICD-10-CM

## 2022-01-01 DIAGNOSIS — I10 Essential (primary) hypertension: Secondary | ICD-10-CM

## 2022-01-01 NOTE — Telephone Encounter (Signed)
Patient has a lab appt 01/05/2022, there are no orders in.

## 2022-01-02 NOTE — Addendum Note (Signed)
Addended by: Alisa Graff on: 01/02/2022 03:55 AM   Modules accepted: Orders

## 2022-01-02 NOTE — Telephone Encounter (Signed)
Orders placed for f/u labs.  

## 2022-01-05 ENCOUNTER — Other Ambulatory Visit (INDEPENDENT_AMBULATORY_CARE_PROVIDER_SITE_OTHER): Payer: PPO

## 2022-01-05 DIAGNOSIS — R748 Abnormal levels of other serum enzymes: Secondary | ICD-10-CM | POA: Diagnosis not present

## 2022-01-05 DIAGNOSIS — D509 Iron deficiency anemia, unspecified: Secondary | ICD-10-CM

## 2022-01-05 DIAGNOSIS — N184 Chronic kidney disease, stage 4 (severe): Secondary | ICD-10-CM

## 2022-01-05 DIAGNOSIS — R739 Hyperglycemia, unspecified: Secondary | ICD-10-CM | POA: Diagnosis not present

## 2022-01-05 LAB — IBC + FERRITIN
Ferritin: 72.6 ng/mL (ref 10.0–291.0)
Iron: 119 ug/dL (ref 42–145)
Saturation Ratios: 47.5 % (ref 20.0–50.0)
TIBC: 250.6 ug/dL (ref 250.0–450.0)
Transferrin: 179 mg/dL — ABNORMAL LOW (ref 212.0–360.0)

## 2022-01-05 LAB — CBC WITH DIFFERENTIAL/PLATELET
Basophils Absolute: 0 10*3/uL (ref 0.0–0.1)
Basophils Relative: 0.7 % (ref 0.0–3.0)
Eosinophils Absolute: 0.1 10*3/uL (ref 0.0–0.7)
Eosinophils Relative: 3.7 % (ref 0.0–5.0)
HCT: 38.1 % (ref 36.0–46.0)
Hemoglobin: 12.5 g/dL (ref 12.0–15.0)
Lymphocytes Relative: 39.9 % (ref 12.0–46.0)
Lymphs Abs: 1.5 10*3/uL (ref 0.7–4.0)
MCHC: 32.9 g/dL (ref 30.0–36.0)
MCV: 90.2 fl (ref 78.0–100.0)
Monocytes Absolute: 0.6 10*3/uL (ref 0.1–1.0)
Monocytes Relative: 16.5 % — ABNORMAL HIGH (ref 3.0–12.0)
Neutro Abs: 1.5 10*3/uL (ref 1.4–7.7)
Neutrophils Relative %: 39.2 % — ABNORMAL LOW (ref 43.0–77.0)
Platelets: 183 10*3/uL (ref 150.0–400.0)
RBC: 4.22 Mil/uL (ref 3.87–5.11)
RDW: 13.6 % (ref 11.5–15.5)
WBC: 3.9 10*3/uL — ABNORMAL LOW (ref 4.0–10.5)

## 2022-01-05 LAB — BASIC METABOLIC PANEL
BUN: 25 mg/dL — ABNORMAL HIGH (ref 6–23)
CO2: 27 mEq/L (ref 19–32)
Calcium: 9.8 mg/dL (ref 8.4–10.5)
Chloride: 108 mEq/L (ref 96–112)
Creatinine, Ser: 1.51 mg/dL — ABNORMAL HIGH (ref 0.40–1.20)
GFR: 32.4 mL/min — ABNORMAL LOW (ref >60.00)
Glucose, Bld: 100 mg/dL — ABNORMAL HIGH (ref 70–99)
Potassium: 5.8 mEq/L — ABNORMAL HIGH (ref 3.5–5.1)
Sodium: 142 mEq/L (ref 135–145)

## 2022-01-05 LAB — HEPATIC FUNCTION PANEL
ALT: 17 U/L (ref 0–35)
AST: 26 U/L (ref 0–37)
Albumin: 4 g/dL (ref 3.5–5.2)
Alkaline Phosphatase: 114 U/L (ref 39–117)
Bilirubin, Direct: 0.1 mg/dL (ref 0.0–0.3)
Total Bilirubin: 0.6 mg/dL (ref 0.2–1.2)
Total Protein: 6.7 g/dL (ref 6.0–8.3)

## 2022-01-05 LAB — LIPID PANEL
Cholesterol: 161 mg/dL (ref 0–200)
HDL: 37.1 mg/dL — ABNORMAL LOW (ref >39.00)
NonHDL: 124.24
Total CHOL/HDL Ratio: 4
Triglycerides: 237 mg/dL — ABNORMAL HIGH (ref 0.0–149.0)
VLDL: 47.4 mg/dL — ABNORMAL HIGH (ref 0.0–40.0)

## 2022-01-05 LAB — LDL CHOLESTEROL, DIRECT: Direct LDL: 80 mg/dL

## 2022-01-05 LAB — HEMOGLOBIN A1C: Hgb A1c MFr Bld: 5.8 % (ref 4.6–6.5)

## 2022-01-06 ENCOUNTER — Telehealth: Payer: Self-pay | Admitting: *Deleted

## 2022-01-06 ENCOUNTER — Other Ambulatory Visit
Admission: RE | Admit: 2022-01-06 | Discharge: 2022-01-06 | Disposition: A | Discharge status: Home / Self Care | Payer: PPO | Source: Ambulatory Visit | Attending: Internal Medicine | Admitting: Internal Medicine

## 2022-01-06 ENCOUNTER — Other Ambulatory Visit: Payer: Self-pay | Admitting: Internal Medicine

## 2022-01-06 DIAGNOSIS — E875 Hyperkalemia: Secondary | ICD-10-CM | POA: Insufficient documentation

## 2022-01-06 LAB — POTASSIUM: Potassium: 4.8 mmol/L (ref 3.5–5.1)

## 2022-01-06 NOTE — Telephone Encounter (Signed)
Pt called back and I read the message to her and she stated she is not taking potassium supplements and salt substitutes

## 2022-01-06 NOTE — Telephone Encounter (Signed)
This has been resolved I sent you the result note and that pt was heading to [facility] to get rechecked

## 2022-01-06 NOTE — Telephone Encounter (Signed)
Left message on cell to all office no voicemail on home number and no answer.

## 2022-01-06 NOTE — Telephone Encounter (Signed)
-----   Message from Einar Pheasant, MD sent at 01/06/2022  4:51 AM EDT ----- Notify Ms Levenhagen that her potassium is elevated.  Please confirm she is not taking any potassium supplements or using an increased amount of salt substitutes.  Needs recheck stat potassium at [facility] today.  Kidney function and white blood cell count stable.  Hgb wnl.  Overall sugar control stable.  Triglycerides increased slightly, but overall cholesterol stable.  Continue low carb diet and exercise.  Liver panel wnl.

## 2022-01-06 NOTE — Progress Notes (Signed)
Order placed for stat potassium to be drawn at [facility]

## 2022-01-06 NOTE — Telephone Encounter (Signed)
Called Caitlin Lin and informed her that potassium was wnl.

## 2022-02-03 ENCOUNTER — Other Ambulatory Visit: Payer: Self-pay | Admitting: Family

## 2022-02-06 ENCOUNTER — Encounter: Payer: PPO | Admitting: Internal Medicine

## 2022-02-09 ENCOUNTER — Encounter: Payer: Self-pay | Admitting: Internal Medicine

## 2022-02-09 ENCOUNTER — Ambulatory Visit (INDEPENDENT_AMBULATORY_CARE_PROVIDER_SITE_OTHER): Payer: PPO | Admitting: Internal Medicine

## 2022-02-09 VITALS — BP 148/84 | HR 69 | Temp 97.9°F | Resp 15 | Ht 64.0 in | Wt 151.6 lb

## 2022-02-09 DIAGNOSIS — D696 Thrombocytopenia, unspecified: Secondary | ICD-10-CM

## 2022-02-09 DIAGNOSIS — Z0000 Encounter for general adult medical examination without abnormal findings: Secondary | ICD-10-CM | POA: Diagnosis not present

## 2022-02-09 DIAGNOSIS — I351 Nonrheumatic aortic (valve) insufficiency: Secondary | ICD-10-CM | POA: Diagnosis not present

## 2022-02-09 DIAGNOSIS — M545 Low back pain, unspecified: Secondary | ICD-10-CM

## 2022-02-09 DIAGNOSIS — I700 Atherosclerosis of aorta: Secondary | ICD-10-CM | POA: Diagnosis not present

## 2022-02-09 DIAGNOSIS — N184 Chronic kidney disease, stage 4 (severe): Secondary | ICD-10-CM

## 2022-02-09 DIAGNOSIS — E875 Hyperkalemia: Secondary | ICD-10-CM | POA: Diagnosis not present

## 2022-02-09 DIAGNOSIS — K760 Fatty (change of) liver, not elsewhere classified: Secondary | ICD-10-CM

## 2022-02-09 DIAGNOSIS — F439 Reaction to severe stress, unspecified: Secondary | ICD-10-CM

## 2022-02-09 DIAGNOSIS — I10 Essential (primary) hypertension: Secondary | ICD-10-CM

## 2022-02-09 DIAGNOSIS — E7800 Pure hypercholesterolemia, unspecified: Secondary | ICD-10-CM | POA: Diagnosis not present

## 2022-02-09 DIAGNOSIS — D509 Iron deficiency anemia, unspecified: Secondary | ICD-10-CM | POA: Diagnosis not present

## 2022-02-09 DIAGNOSIS — E039 Hypothyroidism, unspecified: Secondary | ICD-10-CM

## 2022-02-09 DIAGNOSIS — K219 Gastro-esophageal reflux disease without esophagitis: Secondary | ICD-10-CM | POA: Diagnosis not present

## 2022-02-09 DIAGNOSIS — R739 Hyperglycemia, unspecified: Secondary | ICD-10-CM | POA: Diagnosis not present

## 2022-02-09 MED ORDER — LEVOTHYROXINE SODIUM 75 MCG PO TABS: ORAL_TABLET | ORAL | 1 refills | Status: DC

## 2022-02-09 NOTE — Patient Instructions (Signed)
Pepcid (famotidine) - 20mg  - take one before your evening meal.    Continue protonix 30 minutes before breakfast.

## 2022-02-09 NOTE — Progress Notes (Unsigned)
Patient ID: Caitlin Lin, female   DOB: [birth year], 81 y.o.   MRN: [id number]   Subjective:    Patient ID: Caitlin Lin, female    DOB: 02-05-1941, 81 y.o.   MRN: [id number]   Patient here for No chief complaint on file.  Marland Kitchen   HPI Here for her physical. Has had persistent GI issues.  W/up as outlined.  Takes miralax and stool softeners to help regulate her bowels.     Past Medical History:  Diagnosis Date   Cataracts, bilateral    Chronic kidney disease    Followed by Dr. Holley Raring   Chronic kidney disease    Colon polyp    Constipation due to slow transit    Diarrhea    Diarrhea    GERD (gastroesophageal reflux disease)    Heart murmur    History of cyst of breast    History of hiatal hernia    Hyperlipidemia    Hypertension    Hypothyroidism    Melanoma of lower leg ([place]) 2013   Shingles 2013   Shingles    Spinal stenosis in cervical region    Dr. Sharlet Salina   Subdural hematoma [facility])    Thyroid disease    Past Surgical History:  Procedure Laterality Date   ABDOMINAL HYSTERECTOMY  1974   ANTERIOR AND POSTERIOR VAGINAL REPAIR     ANTERIOR AND POSTERIOR VAGINAL REPAIR W/ SACROSPINOUS LIGAMENT SUSPENSION Right    BREAST BIOPSY  1963   Benign per pt's report   BREAST EXCISIONAL BIOPSY Right 1963   neg   COLONOSCOPY     COLONOSCOPY N/A 10/29/2021   Procedure: COLONOSCOPY;  Surgeon: Toledo, Benay Pike, MD;  Location: [facility] ENDOSCOPY;  Service: Gastroenterology;  Laterality: N/A;   ELECTROMAGNETIC NAVIGATION BROCHOSCOPY Right 01/09/2019   Procedure: ELECTROMAGNETIC NAVIGATION BRONCHOSCOPY;  Surgeon: Tyler Pita, MD;  Location: [facility] ORS;  Service: Cardiopulmonary;  Laterality: Right;   ESOPHAGOGASTRODUODENOSCOPY     ESOPHAGOGASTRODUODENOSCOPY (EGD) WITH PROPOFOL N/A 11/10/2016   Procedure: ESOPHAGOGASTRODUODENOSCOPY (EGD) WITH PROPOFOL;  Surgeon: Lollie Sails, MD;  Location: [facility] ENDOSCOPY;  Service: Endoscopy;  Laterality: N/A;   ESOPHAGOGASTRODUODENOSCOPY  (EGD) WITH PROPOFOL N/A 09/09/2018   Procedure: ESOPHAGOGASTRODUODENOSCOPY (EGD) WITH PROPOFOL;  Surgeon: Lollie Sails, MD;  Location: [facility] ENDOSCOPY;  Service: Endoscopy;  Laterality: N/A;   ESOPHAGOGASTRODUODENOSCOPY (EGD) WITH PROPOFOL N/A 10/27/2019   Procedure: ESOPHAGOGASTRODUODENOSCOPY (EGD) WITH PROPOFOL;  Surgeon: Robert Bellow, MD;  Location: [facility] ENDOSCOPY;  Service: Endoscopy;  Laterality: N/A;   ESOPHAGOGASTRODUODENOSCOPY (EGD) WITH PROPOFOL N/A 08/09/2020   Procedure: ESOPHAGOGASTRODUODENOSCOPY (EGD) WITH PROPOFOL;  Surgeon: Lesly Rubenstein, MD;  Location: [facility] ENDOSCOPY;  Service: Endoscopy;  Laterality: N/A;   LUNG BIOPSY  2007   Benign per pt's report   LUNG BIOPSY Right    OOPHORECTOMY  1970   PERINEOPLASTY  12/17/2013   sling for stress incontinence  12/18/2011   Family History  Problem Relation Age of Onset   Arthritis Mother    Liver disease Mother    Cirrhosis Mother    Liver cancer Mother    Heart disease Father    Heart attack Father    Coronary artery disease Father    Depression Brother    Prostate cancer Brother    Diabetes Maternal Aunt    Breast cancer Cousin        maternal cousins   Liver cancer Cousin    Breast cancer Other    Social History   Socioeconomic History   Marital status:  Married    Spouse name: Not on file   Number of children: 2   Years of education: Not on file   Highest education level: Not on file  Occupational History   Occupation: Retired  Tobacco Use   Smoking status: Never   Smokeless tobacco: Never  Vaping Use   Vaping Use: Never used  Substance and Sexual Activity   Alcohol use: No   Drug use: No   Sexual activity: Yes  Other Topics Concern   Not on file  Social History Narrative   Lives in [place] with husband. Has 2 children boy and girl, 4 grandchildren. Retired Presenter, broadcasting.       Daily Caffeine Use:  NO   Regular Exercise -  Not at this time due to back pain, would like to resume soon    Social Determinants of Health   Financial Resource Strain: Low Risk  (11/26/2021)   Overall Financial Resource Strain (CARDIA)    Difficulty of Paying Living Expenses: Not hard at all  Food Insecurity: No Food Insecurity (11/26/2021)   Hunger Vital Sign    Worried About Running Out of Food in the Last Year: Never true    Ran Out of Food in the Last Year: Never true  Transportation Needs: No Transportation Needs (11/25/2020)   PRAPARE - Hydrologist (Medical): No    Lack of Transportation (Non-Medical): No  Physical Activity: Insufficiently Active (11/26/2021)   Exercise Vital Sign    Days of Exercise per Week: 4 days    Minutes of Exercise per Session: 20 min  Stress: No Stress Concern Present (11/26/2021)   [place]    Feeling of Stress : Not at all  Social Connections: Unknown (11/26/2021)   Social Connection and Isolation Panel [NHANES]    Frequency of Communication with Friends and Family: Not on file    Frequency of Social Gatherings with Friends and Family: Not on file    Attends Religious Services: Not on file    Active Member of Clubs or Organizations: Not on file    Attends Archivist Meetings: Not on file    Marital Status: Married     Review of Systems     Objective:     There were no vitals taken for this visit. Wt Readings from Last 3 Encounters:  11/26/21 151 lb (68.5 kg)  11/05/21 151 lb 6.4 oz (68.7 kg)  10/31/21 155 lb 9.6 oz (70.6 kg)    Physical Exam   Outpatient Encounter Medications as of 02/09/2022  Medication Sig   Alpha-Lipoic Acid 200 MG TABS Take 200 mg by mouth 3 (three) times daily.   amLODipine (NORVASC) 5 MG tablet Take 1 tablet by mouth once daily   aspirin 81 MG tablet Take 1 tablet (81 mg total) by mouth daily.   atorvastatin (LIPITOR) 20 MG tablet Take 1 tablet (20 mg total) by mouth daily.   Cholecalciferol 25 MCG (1000 UT)  tablet Take by mouth.   Docusate Calcium (STOOL SOFTENER PO) Take by mouth.   fluticasone (FLONASE) 50 MCG/ACT nasal spray    Glucosamine-Chondroit-Vit C-Mn (GLUCOSAMINE CHONDR 500 COMPLEX PO) Take 2 tablets by mouth daily.   levothyroxine (SYNTHROID) 75 MCG tablet TAKE 1 TABLET BY MOUTH ONCE DAILY IN THE MORNING BEFORE BREAKFAST   losartan (COZAAR) 50 MG tablet Take 1 tablet (50 mg total) by mouth daily.   Multiple Vitamin (MULTIVITAMIN) tablet Take 1 tablet  by mouth daily.   Omega-3 Fatty Acids (FISH OIL) 1000 MG CAPS Take 1,000 mg by mouth daily.   pantoprazole (PROTONIX) 40 MG tablet One tablet 30 minutes before breakfast and one tablet 30 minutes before your evening meal - prn.   Polyethylene Glycol 3350 (MIRALAX PO) Take by mouth as needed.   Probiotic Product (PROBIOTIC DAILY PO) Take by mouth daily.   sertraline (ZOLOFT) 50 MG tablet Take 1 tablet by mouth once daily   Wheat Dextrin (BENEFIBER PO) Take by mouth.   No facility-administered encounter medications on file as of 02/09/2022.     Lab Results  Component Value Date   WBC 3.9 (L) 01/05/2022   HGB 12.5 01/05/2022   HCT 38.1 01/05/2022   PLT 183.0 01/05/2022   GLUCOSE 100 (H) 01/05/2022   CHOL 161 01/05/2022   TRIG 237.0 (H) 01/05/2022   HDL 37.10 (L) 01/05/2022   LDLDIRECT 80.0 01/05/2022   LDLCALC 75 09/20/2020   ALT 17 01/05/2022   AST 26 01/05/2022   NA 142 01/05/2022   K 4.8 01/06/2022   CL 108 01/05/2022   CREATININE 1.51 (H) 01/05/2022   BUN 25 (H) 01/05/2022   CO2 27 01/05/2022   TSH 1.52 09/03/2021   HGBA1C 5.8 01/05/2022   MICROALBUR 2.1 (H) 03/27/2015       Assessment & Plan:   Problem List Items Addressed This Visit   None    Einar Pheasant, MD

## 2022-02-09 NOTE — Assessment & Plan Note (Addendum)
Physical today 02/09/22.  Mammogram 11/07/21 - Birads 0.  Recommended f/u right breast mammogram.  F/u right breast mammogram - birads I.  Had colonoscopy 10/29/21 - internal hemorrhoids that prolapse with straining, but require manual replacement into the anal canal (Grade III) found on perianal exam. Non-bleeding internal hemorrhoids. Moderate diverticulosis in the sigmoid colon.

## 2022-02-12 ENCOUNTER — Encounter: Payer: Self-pay | Admitting: Internal Medicine

## 2022-02-12 NOTE — Assessment & Plan Note (Signed)
Followed by nephrology -  by Dr Percell Miller.  Evaluated 05/2021.  Losartan decreased to 50mg  q day.  Follow pressures.  Stable.  Continue to avoid antiinflammatories. Continue losartan.

## 2022-02-12 NOTE — Assessment & Plan Note (Signed)
On thyroid replacement.  Follow tsh.  

## 2022-02-12 NOTE — Assessment & Plan Note (Signed)
Losartan adjusted to 50mg  q day.  Follow potassium.

## 2022-02-12 NOTE — Assessment & Plan Note (Signed)
Follow cbc.  

## 2022-02-12 NOTE — Assessment & Plan Note (Signed)
Low carb diet and exercise.  Follow met b and a1c.   

## 2022-02-12 NOTE — Assessment & Plan Note (Signed)
Discussed protonix/pepcid.

## 2022-02-12 NOTE — Assessment & Plan Note (Signed)
On zoloft and doing well on this medication.  Continue zoloft.  Follow.

## 2022-02-12 NOTE — Assessment & Plan Note (Signed)
ECHO - aortic regurgitation.  Saw cardiology.  Coronary artery calcifications on noncontrast chest CT. Previous echocardiogram with normal systolic and diastolic function.  [place] 03/2020 with no significant ischemia, low risk.  Continue aspirin, statin

## 2022-02-12 NOTE — Assessment & Plan Note (Signed)
Blood pressure as outlined. On amlodipine and losartan.  Follow pressures.  Follow metabolic panel. Spot check pressures and send in readings.

## 2022-02-12 NOTE — Assessment & Plan Note (Signed)
Continue lipitor  ?

## 2022-02-12 NOTE — Addendum Note (Signed)
Addended by: Alisa Graff on: 02/12/2022 08:38 PM   Modules accepted: Level of Service

## 2022-02-12 NOTE — Assessment & Plan Note (Signed)
Has been evaluated by Dr Sharlet Salina.  Has previously taken tramadol and gabapentin.  Has been on cymbalta as well.  S/p ESI.  Given persistent pain, request referral to ortho.

## 2022-02-12 NOTE — Assessment & Plan Note (Signed)
Continue lipitor.  Low cholesterol diet and exercise.  Follow lipid panel and liver function tests.

## 2022-02-12 NOTE — Assessment & Plan Note (Signed)
Diet and exercise.  Follow liver panel.

## 2022-02-23 ENCOUNTER — Telehealth: Payer: Self-pay | Admitting: Family

## 2022-02-27 ENCOUNTER — Other Ambulatory Visit: Payer: Self-pay

## 2022-02-27 MED ORDER — AMLODIPINE BESYLATE 5 MG PO TABS: 5.0000 mg | ORAL_TABLET | Freq: Every day | ORAL | 3 refills | Status: DC

## 2022-03-02 NOTE — Telephone Encounter (Signed)
Refill inappropriate was sent in 02/17/22 3 month supply with refills

## 2022-03-26 ENCOUNTER — Ambulatory Visit (INDEPENDENT_AMBULATORY_CARE_PROVIDER_SITE_OTHER): Payer: PPO | Admitting: Internal Medicine

## 2022-03-26 ENCOUNTER — Encounter: Payer: Self-pay | Admitting: Internal Medicine

## 2022-03-26 VITALS — BP 142/80 | HR 76 | Temp 98.2°F | Ht 64.0 in | Wt 152.6 lb

## 2022-03-26 DIAGNOSIS — I700 Atherosclerosis of aorta: Secondary | ICD-10-CM | POA: Diagnosis not present

## 2022-03-26 DIAGNOSIS — D509 Iron deficiency anemia, unspecified: Secondary | ICD-10-CM

## 2022-03-26 DIAGNOSIS — K760 Fatty (change of) liver, not elsewhere classified: Secondary | ICD-10-CM

## 2022-03-26 DIAGNOSIS — R1030 Lower abdominal pain, unspecified: Secondary | ICD-10-CM | POA: Diagnosis not present

## 2022-03-26 DIAGNOSIS — N184 Chronic kidney disease, stage 4 (severe): Secondary | ICD-10-CM

## 2022-03-26 DIAGNOSIS — E7800 Pure hypercholesterolemia, unspecified: Secondary | ICD-10-CM

## 2022-03-26 DIAGNOSIS — M545 Low back pain, unspecified: Secondary | ICD-10-CM

## 2022-03-26 DIAGNOSIS — I351 Nonrheumatic aortic (valve) insufficiency: Secondary | ICD-10-CM

## 2022-03-26 DIAGNOSIS — D696 Thrombocytopenia, unspecified: Secondary | ICD-10-CM

## 2022-03-26 DIAGNOSIS — I10 Essential (primary) hypertension: Secondary | ICD-10-CM | POA: Diagnosis not present

## 2022-03-26 DIAGNOSIS — R739 Hyperglycemia, unspecified: Secondary | ICD-10-CM

## 2022-03-26 DIAGNOSIS — K219 Gastro-esophageal reflux disease without esophagitis: Secondary | ICD-10-CM | POA: Diagnosis not present

## 2022-03-26 DIAGNOSIS — E039 Hypothyroidism, unspecified: Secondary | ICD-10-CM

## 2022-03-26 MED ORDER — AMLODIPINE BESYLATE 5 MG PO TABS: 5.0000 mg | ORAL_TABLET | Freq: Every day | ORAL | 1 refills | Status: DC

## 2022-03-26 MED ORDER — PANTOPRAZOLE SODIUM 40 MG PO TBEC: 40.0000 mg | DELAYED_RELEASE_TABLET | Freq: Every day | ORAL | 1 refills | Status: DC

## 2022-03-26 MED ORDER — LEVOTHYROXINE SODIUM 75 MCG PO TABS: ORAL_TABLET | ORAL | 1 refills | Status: DC

## 2022-03-26 MED ORDER — LOSARTAN POTASSIUM 25 MG PO TABS: 25.0000 mg | ORAL_TABLET | Freq: Every day | ORAL | 1 refills | Status: DC

## 2022-03-26 MED ORDER — ATORVASTATIN CALCIUM 20 MG PO TABS: 20.0000 mg | ORAL_TABLET | Freq: Every day | ORAL | 1 refills | Status: DC

## 2022-03-26 NOTE — Assessment & Plan Note (Signed)
Better with addition of pepcid.  Continues protonix in am.  Follow.

## 2022-03-26 NOTE — Assessment & Plan Note (Signed)
Continue lipitor.  Low cholesterol diet and exercise.  Follow lipid panel and liver function tests.

## 2022-03-26 NOTE — Assessment & Plan Note (Signed)
ECHO - aortic regurgitation.  Saw cardiology.  Coronary artery calcifications on noncontrast chest CT. Previous echocardiogram with normal systolic and diastolic function.  [place] 03/2020 with no significant ischemia, low risk.  Continue aspirin, statin

## 2022-03-26 NOTE — Assessment & Plan Note (Signed)
Continue lipitor  ?

## 2022-03-26 NOTE — Assessment & Plan Note (Signed)
GI symptoms improved as outlined.  Continue pepcid.  Continue miralax prn.  Follow.

## 2022-03-26 NOTE — Assessment & Plan Note (Signed)
Low carb diet and exercise.  Follow met b and a1c.  

## 2022-03-26 NOTE — Assessment & Plan Note (Signed)
Blood pressure as outlined. On amlodipine and losartan.  Follow pressures.  Follow metabolic panel. Spot check pressures and send in readings.

## 2022-03-26 NOTE — Assessment & Plan Note (Signed)
Has been evaluated by Dr Sharlet Salina.  Has previously taken tramadol and gabapentin.  Has been on cymbalta as well.  S/p ESI.  Given persistent pain, request referral to ortho. She plans to f/u with ortho ([facility]).

## 2022-03-26 NOTE — Progress Notes (Signed)
Subjective:    Patient ID: Caitlin Lin, female    DOB: September 30, 1940, 82 y.o.   MRN: [id number]  Patient here for  Chief Complaint  Patient presents with   Medical Management of Chronic Issues    HPI Here to follow up regarding hypercholesterolemia, bowel issues and hypertension.  Last visit, discussed addition of pepcid with protonix.  She does feel this has helped with some of her upper GI symptoms.  She is also taking miralax prn and this is helping with keeping her bowels moving regularly.  Overall reports her GI symptoms have improved some.  Persistent low back pain.  Bothers her more when standing in one place for long periods of time.  Planning to f/u with ortho for further evaluation.  No chest pain or sob reported.  No increased cough or congestion. Reports blood pressures (outside checks - 128-129/78-80).     Past Medical History:  Diagnosis Date   Cataracts, bilateral    Chronic kidney disease    Followed by Dr. Holley Raring   Chronic kidney disease    Colon polyp    Constipation due to slow transit    Diarrhea    Diarrhea    GERD (gastroesophageal reflux disease)    Heart murmur    History of cyst of breast    History of hiatal hernia    Hyperlipidemia    Hypertension    Hypothyroidism    Melanoma of lower leg ([place]) 2013   Shingles 2013   Shingles    Spinal stenosis in cervical region    Dr. Sharlet Salina   Subdural hematoma [facility])    Thyroid disease    Past Surgical History:  Procedure Laterality Date   ABDOMINAL HYSTERECTOMY  1974   ANTERIOR AND POSTERIOR VAGINAL REPAIR     ANTERIOR AND POSTERIOR VAGINAL REPAIR W/ SACROSPINOUS LIGAMENT SUSPENSION Right    BREAST BIOPSY  1963   Benign per pt's report   BREAST EXCISIONAL BIOPSY Right 1963   neg   COLONOSCOPY     COLONOSCOPY N/A 10/29/2021   Procedure: COLONOSCOPY;  Surgeon: Toledo, Benay Pike, MD;  Location: [facility] ENDOSCOPY;  Service: Gastroenterology;  Laterality: N/A;   ELECTROMAGNETIC NAVIGATION BROCHOSCOPY Right  01/09/2019   Procedure: ELECTROMAGNETIC NAVIGATION BRONCHOSCOPY;  Surgeon: Tyler Pita, MD;  Location: [facility] ORS;  Service: Cardiopulmonary;  Laterality: Right;   ESOPHAGOGASTRODUODENOSCOPY     ESOPHAGOGASTRODUODENOSCOPY (EGD) WITH PROPOFOL N/A 11/10/2016   Procedure: ESOPHAGOGASTRODUODENOSCOPY (EGD) WITH PROPOFOL;  Surgeon: Lollie Sails, MD;  Location: [facility] ENDOSCOPY;  Service: Endoscopy;  Laterality: N/A;   ESOPHAGOGASTRODUODENOSCOPY (EGD) WITH PROPOFOL N/A 09/09/2018   Procedure: ESOPHAGOGASTRODUODENOSCOPY (EGD) WITH PROPOFOL;  Surgeon: Lollie Sails, MD;  Location: [facility] ENDOSCOPY;  Service: Endoscopy;  Laterality: N/A;   ESOPHAGOGASTRODUODENOSCOPY (EGD) WITH PROPOFOL N/A 10/27/2019   Procedure: ESOPHAGOGASTRODUODENOSCOPY (EGD) WITH PROPOFOL;  Surgeon: Robert Bellow, MD;  Location: [facility] ENDOSCOPY;  Service: Endoscopy;  Laterality: N/A;   ESOPHAGOGASTRODUODENOSCOPY (EGD) WITH PROPOFOL N/A 08/09/2020   Procedure: ESOPHAGOGASTRODUODENOSCOPY (EGD) WITH PROPOFOL;  Surgeon: Lesly Rubenstein, MD;  Location: [facility] ENDOSCOPY;  Service: Endoscopy;  Laterality: N/A;   LUNG BIOPSY  2007   Benign per pt's report   LUNG BIOPSY Right    OOPHORECTOMY  1970   PERINEOPLASTY  12/17/2013   sling for stress incontinence  12/18/2011   Family History  Problem Relation Age of Onset   Arthritis Mother    Liver disease Mother    Cirrhosis Mother    Liver cancer Mother  Heart disease Father    Heart attack Father    Coronary artery disease Father    Depression Brother    Prostate cancer Brother    Diabetes Maternal Aunt    Breast cancer Cousin        maternal cousins   Liver cancer Cousin    Breast cancer Other    Social History   Socioeconomic History   Marital status: Married    Spouse name: Not on file   Number of children: 2   Years of education: Not on file   Highest education level: Not on file  Occupational History   Occupation: Retired  Tobacco Use   Smoking  status: Never   Smokeless tobacco: Never  Vaping Use   Vaping Use: Never used  Substance and Sexual Activity   Alcohol use: No   Drug use: No   Sexual activity: Yes  Other Topics Concern   Not on file  Social History Narrative   Lives in [place] with husband. Has 2 children boy and girl, 4 grandchildren. Retired Presenter, broadcasting.       Daily Caffeine Use:  NO   Regular Exercise -  Not at this time due to back pain, would like to resume soon   Social Determinants of Health   Financial Resource Strain: Low Risk  (11/26/2021)   Overall Financial Resource Strain (CARDIA)    Difficulty of Paying Living Expenses: Not hard at all  Food Insecurity: No Food Insecurity (11/26/2021)   Hunger Vital Sign    Worried About Running Out of Food in the Last Year: Never true    Ran Out of Food in the Last Year: Never true  Transportation Needs: No Transportation Needs (11/25/2020)   PRAPARE - Hydrologist (Medical): No    Lack of Transportation (Non-Medical): No  Physical Activity: Insufficiently Active (11/26/2021)   Exercise Vital Sign    Days of Exercise per Week: 4 days    Minutes of Exercise per Session: 20 min  Stress: No Stress Concern Present (11/26/2021)   [place]    Feeling of Stress : Not at all  Social Connections: Unknown (11/26/2021)   Social Connection and Isolation Panel [NHANES]    Frequency of Communication with Friends and Family: Not on file    Frequency of Social Gatherings with Friends and Family: Not on file    Attends Religious Services: Not on file    Active Member of Clubs or Organizations: Not on file    Attends Archivist Meetings: Not on file    Marital Status: Married     Review of Systems  Constitutional:  Negative for appetite change and unexpected weight change.  HENT:  Negative for congestion and sinus pressure.   Respiratory:  Negative for cough, chest  tightness and shortness of breath.   Cardiovascular:  Negative for chest pain, palpitations and leg swelling.  Gastrointestinal:  Negative for nausea and vomiting.       GI issues as outlined.   Genitourinary:  Negative for difficulty urinating and dysuria.  Musculoskeletal:  Positive for back pain. Negative for myalgias.  Skin:  Negative for color change and rash.  Neurological:  Negative for dizziness, light-headedness and headaches.  Psychiatric/Behavioral:  Negative for agitation and dysphoric mood.        Objective:     BP (!) 142/80   Pulse 76   Temp 98.2 F (36.8 C) (Oral)  Ht 5\' 4"  (1.626 m)   Wt 152 lb 9.6 oz (69.2 kg)   SpO2 98%   BMI 26.19 kg/m  Wt Readings from Last 3 Encounters:  03/26/22 152 lb 9.6 oz (69.2 kg)  02/09/22 151 lb 9.6 oz (68.8 kg)  11/26/21 151 lb (68.5 kg)    Physical Exam Vitals reviewed.  Constitutional:      General: She is not in acute distress.    Appearance: Normal appearance.  HENT:     Head: Normocephalic and atraumatic.     Right Ear: External ear normal.     Left Ear: External ear normal.  Eyes:     General: No scleral icterus.       Right eye: No discharge.        Left eye: No discharge.     Conjunctiva/sclera: Conjunctivae normal.  Neck:     Thyroid: No thyromegaly.  Cardiovascular:     Rate and Rhythm: Normal rate and regular rhythm.  Pulmonary:     Effort: No respiratory distress.     Breath sounds: Normal breath sounds. No wheezing.  Abdominal:     General: Bowel sounds are normal.     Palpations: Abdomen is soft.     Tenderness: There is no abdominal tenderness.  Musculoskeletal:        General: No swelling or tenderness.     Cervical back: Neck supple. No tenderness.  Lymphadenopathy:     Cervical: No cervical adenopathy.  Skin:    Findings: No erythema or rash.  Neurological:     Mental Status: She is alert.  Psychiatric:        Mood and Affect: Mood normal.        Behavior: Behavior normal.       Outpatient Encounter Medications as of 03/26/2022  Medication Sig   Alpha-Lipoic Acid 200 MG TABS Take 200 mg by mouth 3 (three) times daily.   aspirin 81 MG tablet Take 1 tablet (81 mg total) by mouth daily.   Cholecalciferol 25 MCG (1000 UT) tablet Take by mouth.   Docusate Calcium (STOOL SOFTENER PO) Take by mouth.   fluticasone (FLONASE) 50 MCG/ACT nasal spray    Glucosamine-Chondroit-Vit C-Mn (GLUCOSAMINE CHONDR 500 COMPLEX PO) Take 2 tablets by mouth daily.   losartan (COZAAR) 25 MG tablet Take 1 tablet (25 mg total) by mouth daily.   Multiple Vitamin (MULTIVITAMIN) tablet Take 1 tablet by mouth daily.   Omega-3 Fatty Acids (FISH OIL) 1000 MG CAPS Take 1,000 mg by mouth daily.   Polyethylene Glycol 3350 (MIRALAX PO) Take by mouth as needed.   Probiotic Product (PROBIOTIC DAILY PO) Take by mouth daily.   Wheat Dextrin (BENEFIBER PO) Take by mouth.   [DISCONTINUED] amLODipine (NORVASC) 5 MG tablet Take 1 tablet (5 mg total) by mouth daily.   [DISCONTINUED] atorvastatin (LIPITOR) 20 MG tablet Take 1 tablet (20 mg total) by mouth daily.   [DISCONTINUED] levothyroxine (SYNTHROID) 75 MCG tablet TAKE 1 TABLET BY MOUTH ONCE DAILY IN THE MORNING BEFORE BREAKFAST   [DISCONTINUED] losartan (COZAAR) 50 MG tablet Take 1 tablet (50 mg total) by mouth daily.   [DISCONTINUED] pantoprazole (PROTONIX) 40 MG tablet One tablet 30 minutes before breakfast and one tablet 30 minutes before your evening meal - prn.   amLODipine (NORVASC) 5 MG tablet Take 1 tablet (5 mg total) by mouth daily.   atorvastatin (LIPITOR) 20 MG tablet Take 1 tablet (20 mg total) by mouth daily.   levothyroxine (SYNTHROID) 75 MCG tablet TAKE  1 TABLET BY MOUTH ONCE DAILY IN THE MORNING BEFORE BREAKFAST   pantoprazole (PROTONIX) 40 MG tablet Take 1 tablet (40 mg total) by mouth daily. Take 30 minutes before breakfast   [DISCONTINUED] sertraline (ZOLOFT) 50 MG tablet Take 1 tablet by mouth once daily (Patient not taking:  Reported on 03/26/2022)   No facility-administered encounter medications on file as of 03/26/2022.     Lab Results  Component Value Date   WBC 3.9 (L) 01/05/2022   HGB 12.5 01/05/2022   HCT 38.1 01/05/2022   PLT 183.0 01/05/2022   GLUCOSE 100 (H) 01/05/2022   CHOL 161 01/05/2022   TRIG 237.0 (H) 01/05/2022   HDL 37.10 (L) 01/05/2022   LDLDIRECT 80.0 01/05/2022   LDLCALC 75 09/20/2020   ALT 17 01/05/2022   AST 26 01/05/2022   NA 142 01/05/2022   K 4.8 01/06/2022   CL 108 01/05/2022   CREATININE 1.51 (H) 01/05/2022   BUN 25 (H) 01/05/2022   CO2 27 01/05/2022   TSH 1.52 09/03/2021   HGBA1C 5.8 01/05/2022   MICROALBUR 2.1 (H) 03/27/2015    No results found.     Assessment & Plan:  Lower abdominal pain Assessment & Plan: GI symptoms improved as outlined.  Continue pepcid.  Continue miralax prn.  Follow.    Iron deficiency anemia, unspecified iron deficiency anemia type Assessment & Plan: Follow cbc and iron studies.    Aortic atherosclerosis (HCC) Assessment & Plan: Continue lipitor.    Aortic valve insufficiency, etiology of cardiac valve disease unspecified Assessment & Plan: ECHO - aortic regurgitation.  Saw cardiology.  Coronary artery calcifications on noncontrast chest CT. Previous echocardiogram with normal systolic and diastolic function.  [place] 03/2020 with no significant ischemia, low risk.  Continue aspirin, statin    CKD (chronic kidney disease) stage 4, GFR 15-29 ml/min [facility]) Assessment & Plan: Followed by nephrology -  by Dr Percell Miller.  Evaluated 05/2021.  Losartan decreased to 50mg  - 1/2 table q day.  Follow pressures.  Stable.  Continue to avoid antiinflammatories. Continue losartan.    Fatty liver Assessment & Plan: Follow liver panel.    Gastroesophageal reflux disease without esophagitis Assessment & Plan: Better with addition of pepcid.  Continues protonix in am.  Follow.    Hypercholesterolemia Assessment & Plan: Continue  lipitor.  Low cholesterol diet and exercise.  Follow lipid panel and liver function tests.     Hyperglycemia Assessment & Plan: Low carb diet and exercise.  Follow met b and a1c.     Primary hypertension Assessment & Plan: Blood pressure as outlined. On amlodipine and losartan.  Follow pressures.  Follow metabolic panel. Spot check pressures and send in readings.    Hypothyroidism, unspecified type Assessment & Plan: On thyroid replacement.  Follow tsh.    Low back pain without sciatica, unspecified back pain laterality, unspecified chronicity Assessment & Plan: Has been evaluated by Dr Sharlet Salina.  Has previously taken tramadol and gabapentin.  Has been on cymbalta as well.  S/p ESI.  Given persistent pain, request referral to ortho. She plans to f/u with ortho ([facility]).    Thrombocytopenia ([place]) Assessment & Plan: Follow cbc.    Other orders -     amLODIPine Besylate; Take 1 tablet (5 mg total) by mouth daily.  Dispense: 90 tablet; Refill: 1 -     Atorvastatin Calcium; Take 1 tablet (20 mg total) by mouth daily.  Dispense: 90 tablet; Refill: 1 -     Levothyroxine Sodium; TAKE 1 TABLET BY  MOUTH ONCE DAILY IN THE MORNING BEFORE BREAKFAST  Dispense: 90 tablet; Refill: 1 -     Losartan Potassium; Take 1 tablet (25 mg total) by mouth daily.  Dispense: 90 tablet; Refill: 1 -     Pantoprazole Sodium; Take 1 tablet (40 mg total) by mouth daily. Take 30 minutes before breakfast  Dispense: 90 tablet; Refill: 1     Einar Pheasant, MD

## 2022-03-26 NOTE — Assessment & Plan Note (Signed)
On thyroid replacement.  Follow tsh.  

## 2022-03-26 NOTE — Assessment & Plan Note (Signed)
Follow liver panel.  

## 2022-03-26 NOTE — Assessment & Plan Note (Signed)
Followed by nephrology -  by Dr Percell Miller.  Evaluated 05/2021.  Losartan decreased to 50mg  - 1/2 table q day.  Follow pressures.  Stable.  Continue to avoid antiinflammatories. Continue losartan.

## 2022-03-26 NOTE — Assessment & Plan Note (Signed)
Follow cbc.  

## 2022-03-26 NOTE — Assessment & Plan Note (Signed)
Follow cbc and iron studies.  

## 2022-04-09 DIAGNOSIS — M546 Pain in thoracic spine: Secondary | ICD-10-CM | POA: Diagnosis not present

## 2022-04-09 DIAGNOSIS — M47816 Spondylosis without myelopathy or radiculopathy, lumbar region: Secondary | ICD-10-CM | POA: Diagnosis not present

## 2022-04-09 DIAGNOSIS — M419 Scoliosis, unspecified: Secondary | ICD-10-CM | POA: Diagnosis not present

## 2022-04-09 DIAGNOSIS — M47896 Other spondylosis, lumbar region: Secondary | ICD-10-CM | POA: Diagnosis not present

## 2022-04-09 DIAGNOSIS — M545 Low back pain, unspecified: Secondary | ICD-10-CM | POA: Diagnosis not present

## 2022-04-14 ENCOUNTER — Ambulatory Visit (INDEPENDENT_AMBULATORY_CARE_PROVIDER_SITE_OTHER): Payer: PPO | Admitting: Pulmonary Disease

## 2022-04-14 ENCOUNTER — Encounter: Payer: Self-pay | Admitting: Internal Medicine

## 2022-04-14 ENCOUNTER — Encounter: Payer: Self-pay | Admitting: Pulmonary Disease

## 2022-04-14 VITALS — BP 140/80 | HR 63 | Temp 98.0°F | Ht 64.0 in | Wt 150.0 lb

## 2022-04-14 DIAGNOSIS — R058 Other specified cough: Secondary | ICD-10-CM | POA: Diagnosis not present

## 2022-04-14 DIAGNOSIS — K449 Diaphragmatic hernia without obstruction or gangrene: Secondary | ICD-10-CM

## 2022-04-14 DIAGNOSIS — R918 Other nonspecific abnormal finding of lung field: Secondary | ICD-10-CM

## 2022-04-14 NOTE — Patient Instructions (Signed)
We will get a follow-up CT chest.  Will see him in follow-up in 6 months time.  If all looks well on the CT chest we may cancel this appointment and you can continue to follow with Dr. Nicki Reaper.  Otherwise we will provide you with further instructions.

## 2022-04-14 NOTE — Progress Notes (Signed)
Subjective:    Patient ID: Caitlin Lin, female    DOB: [birth year], 82 y.o.   MRN: [id number] Patient Care Team: Einar Pheasant, MD as PCP - General (Internal Medicine) Kate Sable, MD as PCP - Cardiology (Cardiology) Anthonette Legato, MD (Internal Medicine) Ok Edwards, NP as Nurse Practitioner (Gastroenterology) Chief Complaint  Patient presents with   Follow-up    No SOB or wheezing. Dry cough in the mornings due to nasal drip.     HPI Caitlin Lin is a 82 year old life long never smoker, with nodular clusters on the LEFT and RIGHT upper lobes.  She had a negative navigational bronchoscopy on 09 January 2019 where the right upper lobe lung nodule cluster was sampled and showed mostly inflammatory changes. She was originally referred to [place] by Dr. Nestor Lewandowsky.  Her primary care physician is Dr. Einar Pheasant.  Her last visit here was on 01 October 2020 2022 at that time she was doing well from the respiratory standpoint.  She had a CT scan of the chest abdomen and pelvis on 28 June 2020 that showed stability to regression  in her previously noted nodular densities that are believed to be due to chronic inflammatory/infectious process, suspect MAI.  However, cultures performed at the time of bronchoscopy did not show MAI.  Since she was last year she has not had any shortness of breath, orthopnea or paroxysmal nocturnal dyspnea..   She has not had any  sputum production or hemoptysis.  She has cough mostly in the mornings due to postnasal drip however once she clears her nasal passages she has normal cough for the rest of the day.  No palpitations.  No chest pain, no lower extremity edema or calf tenderness.  Review of Systems A 10 point review of systems was performed and it is as noted above otherwise negative.  Patient Active Problem List   Diagnosis Date Noted   Weakness of both legs 09/06/2021   Nasal drainage 05/07/2021   Throat congestion 01/05/2021    Thrombocytopenia (HCC) 01/03/2021   Elevated alkaline phosphatase level 09/30/2020   Hyperkalemia 09/23/2020   Stress 05/18/2020   Fatigue 03/30/2020   Leg swelling 03/30/2020   Dysphagia 08/09/2019   Sleep difficulties 11/23/2018   Numbness of toes 11/23/2018   Pelvic pressure in female 08/28/2018   Hyperglycemia 11/20/2017   Aortic atherosclerosis ([place]) 06/16/2017   Low back pain 06/06/2017   Abdominal pain 06/03/2017   Fatty liver 02/13/2017   Aortic regurgitation 07/01/2016   Health care maintenance 05/12/2016   Chest pain 05/12/2016   Angular cheilitis 03/27/2015   TMJ arthralgia 09/25/2014   GERD (gastroesophageal reflux disease) 12/05/2013   Osteopenia 10/30/2013   Hypercholesterolemia 03/04/2013   Medicare annual wellness visit, subsequent 09/05/2012   Anemia, iron deficiency 06/08/2012   Screening for breast cancer 08/03/2011   Rectal prolapse 08/03/2011   Hypertension 02/09/2011   CKD (chronic kidney disease) stage 4, GFR 15-29 ml/min ([place]) 02/09/2011   Hypothyroidism 02/09/2011   Social History   Tobacco Use   Smoking status: Never   Smokeless tobacco: Never  Substance Use Topics   Alcohol use: No   Allergies  Allergen Reactions   Quinapril Other (See Comments)    Hyperkalemia   Codeine Rash   Current Meds  Medication Sig   Alpha-Lipoic Acid 200 MG TABS Take 200 mg by mouth 3 (three) times daily.   amLODipine (NORVASC) 5 MG tablet Take 1 tablet (5 mg total) by mouth daily.   aspirin  81 MG tablet Take 1 tablet (81 mg total) by mouth daily.   atorvastatin (LIPITOR) 20 MG tablet Take 1 tablet (20 mg total) by mouth daily.   Cholecalciferol 25 MCG (1000 UT) tablet Take by mouth.   Docusate Calcium (STOOL SOFTENER PO) Take by mouth.   fluticasone (FLONASE) 50 MCG/ACT nasal spray    Glucosamine-Chondroit-Vit C-Mn (GLUCOSAMINE CHONDR 500 COMPLEX PO) Take 2 tablets by mouth daily.   levothyroxine (SYNTHROID) 75 MCG tablet TAKE 1 TABLET BY MOUTH ONCE DAILY IN  THE MORNING BEFORE BREAKFAST   losartan (COZAAR) 25 MG tablet Take 1 tablet (25 mg total) by mouth daily.   methylPREDNISolone (MEDROL DOSEPAK) 4 MG TBPK tablet 4mg  dose pack   Multiple Vitamin (MULTIVITAMIN) tablet Take 1 tablet by mouth daily.   Omega-3 Fatty Acids (FISH OIL) 1000 MG CAPS Take 1,000 mg by mouth daily.   pantoprazole (PROTONIX) 40 MG tablet Take 1 tablet (40 mg total) by mouth daily. Take 30 minutes before breakfast   Polyethylene Glycol 3350 (MIRALAX PO) Take by mouth as needed.   Probiotic Product (PROBIOTIC DAILY PO) Take by mouth daily.   Wheat Dextrin (BENEFIBER PO) Take by mouth.   Immunization History  Administered Date(s) Administered   Fluad Quad(high Dose 65+) 12/22/2019, 01/03/2021   Hepatitis A 07/14/2017, 08/17/2017   Hepatitis B 07/14/2017, 08/17/2017   Influenza Split 12/15/2010, 03/07/2012   Influenza, High Dose Seasonal PF 12/21/2016, 11/17/2017, 12/26/2018, 12/31/2021   Influenza,inj,Quad PF,6+ Mos 12/05/2013   Influenza-Unspecified 12/25/2014, 01/03/2016, 12/28/2016   PFIZER(Purple Top)SARS-COV-2 Vaccination 05/29/2019, 06/19/2019   Pneumococcal Conjugate-13 03/12/2014   Pneumococcal Polysaccharide-23 12/09/2005, 03/03/2013   Tdap 03/07/2012   Zoster, Live 12/09/2009       Objective:   Physical Exam BP (!) 140/80 (BP Location: Left Arm, Cuff Size: Normal)   Pulse 63   Temp 98 F (36.7 C)   Ht 5\' 4"  (1.626 m)   Wt 150 lb (68 kg)   SpO2 98%   BMI 25.75 kg/m   SpO2: 98 % O2 Device: None (Room air)  GENERAL: Well-developed, well-nourished female, age-appropriate, no acute distress, fully ambulatory. No conversational dyspnea.  Mild bronzing of the skin. HEAD: Normocephalic, atraumatic. EYES: Pupils equal, round, reactive to light.  No scleral icterus. MOUTH: Dentition intact, oral mucosa moist.  No thrush. NECK: Supple. No thyromegaly. Trachea midline. No JVD.  No adenopathy. PULMONARY: Good air entry bilaterally.  No adventitious  sounds. CARDIOVASCULAR: S1 and S2. Regular rate and rhythm. No rubs, murmurs or gallops heard. ABDOMEN: Benign. MUSCULOSKELETAL: No joint deformity, no clubbing, no edema. NEUROLOGIC: No focal deficits, no gait disturbance, fluent speech. SKIN: Intact,warm,dry. On limited exam, no rashes. PSYCH: Mood and behavior normal.     Assessment & Plan:     ICD-10-CM   1. Lung nodules  R91.8 CT CHEST WO CONTRAST   Obtain CT chest If findings are stable she does not need further imaging Follow-up in 6 months otherwise    2. Other cough  R05.8    Self-limiting to the mornings Related to postnasal drip Well-controlled with conservative measures    3. Hiatal hernia  K44.9    May add to her issues of cough if has reflux No recent issues with reflux     Orders Placed This Encounter  Procedures   CT CHEST WO CONTRAST    Standing Status:   Future    Standing Expiration Date:   04/15/2023    Order Specific Question:   Preferred imaging location?    Answer:  [facility]   We will notify patient of the results of the CT scan.  Otherwise follow-up in 6 months time.  If her CT scan however is stable or shows regression of the nodules will release to the care of her primary care physician.  Renold Don, MD Advanced Bronchoscopy PCCM [facility]    *This note was dictated using voice recognition software/Dragon.  Despite best efforts to proofread, errors can occur which can change the meaning. Any transcriptional errors that result from this process are unintentional and may not be fully corrected at the time of dictation.

## 2022-04-23 ENCOUNTER — Ambulatory Visit
Admission: RE | Admit: 2022-04-23 | Discharge: 2022-04-23 | Disposition: A | Discharge status: Home / Self Care | Payer: HMO | Source: Ambulatory Visit | Attending: Pulmonary Disease | Admitting: Pulmonary Disease

## 2022-04-23 DIAGNOSIS — K449 Diaphragmatic hernia without obstruction or gangrene: Secondary | ICD-10-CM | POA: Insufficient documentation

## 2022-04-23 DIAGNOSIS — J9811 Atelectasis: Secondary | ICD-10-CM | POA: Diagnosis not present

## 2022-04-23 DIAGNOSIS — R911 Solitary pulmonary nodule: Secondary | ICD-10-CM | POA: Diagnosis not present

## 2022-04-23 DIAGNOSIS — I700 Atherosclerosis of aorta: Secondary | ICD-10-CM | POA: Insufficient documentation

## 2022-04-23 DIAGNOSIS — I2510 Atherosclerotic heart disease of native coronary artery without angina pectoris: Secondary | ICD-10-CM | POA: Diagnosis not present

## 2022-04-23 DIAGNOSIS — R918 Other nonspecific abnormal finding of lung field: Secondary | ICD-10-CM | POA: Insufficient documentation

## 2022-04-30 DIAGNOSIS — M545 Low back pain, unspecified: Secondary | ICD-10-CM | POA: Diagnosis not present

## 2022-04-30 DIAGNOSIS — M47816 Spondylosis without myelopathy or radiculopathy, lumbar region: Secondary | ICD-10-CM | POA: Diagnosis not present

## 2022-04-30 DIAGNOSIS — M419 Scoliosis, unspecified: Secondary | ICD-10-CM | POA: Diagnosis not present

## 2022-04-30 DIAGNOSIS — M47896 Other spondylosis, lumbar region: Secondary | ICD-10-CM | POA: Diagnosis not present

## 2022-04-30 DIAGNOSIS — M546 Pain in thoracic spine: Secondary | ICD-10-CM | POA: Diagnosis not present

## 2022-05-01 DIAGNOSIS — M545 Low back pain, unspecified: Secondary | ICD-10-CM | POA: Diagnosis not present

## 2022-05-04 DIAGNOSIS — M545 Low back pain, unspecified: Secondary | ICD-10-CM | POA: Diagnosis not present

## 2022-05-13 DIAGNOSIS — M545 Low back pain, unspecified: Secondary | ICD-10-CM | POA: Diagnosis not present

## 2022-05-15 DIAGNOSIS — M545 Low back pain, unspecified: Secondary | ICD-10-CM | POA: Diagnosis not present

## 2022-05-19 DIAGNOSIS — M545 Low back pain, unspecified: Secondary | ICD-10-CM | POA: Diagnosis not present

## 2022-05-21 DIAGNOSIS — M545 Low back pain, unspecified: Secondary | ICD-10-CM | POA: Diagnosis not present

## 2022-05-27 DIAGNOSIS — M545 Low back pain, unspecified: Secondary | ICD-10-CM | POA: Diagnosis not present

## 2022-05-27 DIAGNOSIS — M4316 Spondylolisthesis, lumbar region: Secondary | ICD-10-CM | POA: Diagnosis not present

## 2022-05-29 DIAGNOSIS — M545 Low back pain, unspecified: Secondary | ICD-10-CM | POA: Diagnosis not present

## 2022-06-02 DIAGNOSIS — M4316 Spondylolisthesis, lumbar region: Secondary | ICD-10-CM | POA: Diagnosis not present

## 2022-06-04 DIAGNOSIS — N1832 Chronic kidney disease, stage 3b: Secondary | ICD-10-CM | POA: Diagnosis not present

## 2022-06-04 DIAGNOSIS — E875 Hyperkalemia: Secondary | ICD-10-CM | POA: Diagnosis not present

## 2022-06-04 DIAGNOSIS — I10 Essential (primary) hypertension: Secondary | ICD-10-CM | POA: Diagnosis not present

## 2022-06-08 DIAGNOSIS — R32 Unspecified urinary incontinence: Secondary | ICD-10-CM | POA: Diagnosis not present

## 2022-06-08 DIAGNOSIS — N1832 Chronic kidney disease, stage 3b: Secondary | ICD-10-CM | POA: Diagnosis not present

## 2022-06-08 DIAGNOSIS — I10 Essential (primary) hypertension: Secondary | ICD-10-CM | POA: Diagnosis not present

## 2022-06-18 ENCOUNTER — Emergency Department: Payer: HMO

## 2022-06-18 ENCOUNTER — Telehealth: Payer: Self-pay | Admitting: Internal Medicine

## 2022-06-18 ENCOUNTER — Inpatient Hospital Stay
Admission: EM | Admit: 2022-06-18 | Discharge: 2022-06-21 | DRG: 871 | Disposition: A | Discharge status: Home / Self Care | Payer: HMO | Attending: Osteopathic Medicine | Admitting: Osteopathic Medicine

## 2022-06-18 DIAGNOSIS — I2489 Other forms of acute ischemic heart disease: Secondary | ICD-10-CM | POA: Diagnosis present

## 2022-06-18 DIAGNOSIS — Z818 Family history of other mental and behavioral disorders: Secondary | ICD-10-CM

## 2022-06-18 DIAGNOSIS — I10 Essential (primary) hypertension: Secondary | ICD-10-CM | POA: Diagnosis present

## 2022-06-18 DIAGNOSIS — Z885 Allergy status to narcotic agent status: Secondary | ICD-10-CM | POA: Diagnosis not present

## 2022-06-18 DIAGNOSIS — R739 Hyperglycemia, unspecified: Secondary | ICD-10-CM

## 2022-06-18 DIAGNOSIS — Z79890 Hormone replacement therapy (postmenopausal): Secondary | ICD-10-CM

## 2022-06-18 DIAGNOSIS — B9620 Unspecified Escherichia coli [E. coli] as the cause of diseases classified elsewhere: Secondary | ICD-10-CM | POA: Diagnosis not present

## 2022-06-18 DIAGNOSIS — R6520 Severe sepsis without septic shock: Secondary | ICD-10-CM | POA: Diagnosis not present

## 2022-06-18 DIAGNOSIS — R509 Fever, unspecified: Secondary | ICD-10-CM | POA: Diagnosis not present

## 2022-06-18 DIAGNOSIS — Z8042 Family history of malignant neoplasm of prostate: Secondary | ICD-10-CM | POA: Diagnosis not present

## 2022-06-18 DIAGNOSIS — Z8261 Family history of arthritis: Secondary | ICD-10-CM | POA: Diagnosis not present

## 2022-06-18 DIAGNOSIS — I480 Paroxysmal atrial fibrillation: Secondary | ICD-10-CM | POA: Diagnosis not present

## 2022-06-18 DIAGNOSIS — N10 Acute tubulo-interstitial nephritis: Secondary | ICD-10-CM | POA: Diagnosis present

## 2022-06-18 DIAGNOSIS — Z888 Allergy status to other drugs, medicaments and biological substances status: Secondary | ICD-10-CM | POA: Diagnosis not present

## 2022-06-18 DIAGNOSIS — Z86010 Personal history of colonic polyps: Secondary | ICD-10-CM

## 2022-06-18 DIAGNOSIS — K219 Gastro-esophageal reflux disease without esophagitis: Secondary | ICD-10-CM | POA: Diagnosis present

## 2022-06-18 DIAGNOSIS — I4891 Unspecified atrial fibrillation: Secondary | ICD-10-CM | POA: Diagnosis present

## 2022-06-18 DIAGNOSIS — Z87820 Personal history of traumatic brain injury: Secondary | ICD-10-CM

## 2022-06-18 DIAGNOSIS — Z79899 Other long term (current) drug therapy: Secondary | ICD-10-CM

## 2022-06-18 DIAGNOSIS — R519 Headache, unspecified: Secondary | ICD-10-CM | POA: Diagnosis not present

## 2022-06-18 DIAGNOSIS — Z7982 Long term (current) use of aspirin: Secondary | ICD-10-CM

## 2022-06-18 DIAGNOSIS — I5A Non-ischemic myocardial injury (non-traumatic): Secondary | ICD-10-CM | POA: Diagnosis present

## 2022-06-18 DIAGNOSIS — Z85820 Personal history of malignant melanoma of skin: Secondary | ICD-10-CM

## 2022-06-18 DIAGNOSIS — E7800 Pure hypercholesterolemia, unspecified: Secondary | ICD-10-CM

## 2022-06-18 DIAGNOSIS — K5730 Diverticulosis of large intestine without perforation or abscess without bleeding: Secondary | ICD-10-CM | POA: Diagnosis not present

## 2022-06-18 DIAGNOSIS — I129 Hypertensive chronic kidney disease with stage 1 through stage 4 chronic kidney disease, or unspecified chronic kidney disease: Secondary | ICD-10-CM | POA: Diagnosis not present

## 2022-06-18 DIAGNOSIS — N184 Chronic kidney disease, stage 4 (severe): Secondary | ICD-10-CM | POA: Diagnosis not present

## 2022-06-18 DIAGNOSIS — R6521 Severe sepsis with septic shock: Secondary | ICD-10-CM | POA: Diagnosis not present

## 2022-06-18 DIAGNOSIS — E039 Hypothyroidism, unspecified: Secondary | ICD-10-CM | POA: Diagnosis present

## 2022-06-18 DIAGNOSIS — Z8249 Family history of ischemic heart disease and other diseases of the circulatory system: Secondary | ICD-10-CM

## 2022-06-18 DIAGNOSIS — Z800 Family history of malignant neoplasm of digestive organs: Secondary | ICD-10-CM | POA: Diagnosis not present

## 2022-06-18 DIAGNOSIS — I700 Atherosclerosis of aorta: Secondary | ICD-10-CM | POA: Diagnosis not present

## 2022-06-18 DIAGNOSIS — A4151 Sepsis due to Escherichia coli [E. coli]: Principal | ICD-10-CM | POA: Diagnosis present

## 2022-06-18 DIAGNOSIS — I2510 Atherosclerotic heart disease of native coronary artery without angina pectoris: Secondary | ICD-10-CM | POA: Insufficient documentation

## 2022-06-18 DIAGNOSIS — Z833 Family history of diabetes mellitus: Secondary | ICD-10-CM

## 2022-06-18 DIAGNOSIS — Z803 Family history of malignant neoplasm of breast: Secondary | ICD-10-CM

## 2022-06-18 DIAGNOSIS — A419 Sepsis, unspecified organism: Secondary | ICD-10-CM | POA: Diagnosis present

## 2022-06-18 DIAGNOSIS — N390 Urinary tract infection, site not specified: Secondary | ICD-10-CM | POA: Diagnosis not present

## 2022-06-18 LAB — CBC WITH DIFFERENTIAL/PLATELET
Abs Immature Granulocytes: 0.01 10*3/uL (ref 0.00–0.07)
Basophils Absolute: 0 10*3/uL (ref 0.0–0.1)
Basophils Relative: 0 %
Eosinophils Absolute: 0 10*3/uL (ref 0.0–0.5)
Eosinophils Relative: 0 %
HCT: 42 % (ref 36.0–46.0)
Hemoglobin: 13.6 g/dL (ref 12.0–15.0)
Immature Granulocytes: 0 %
Lymphocytes Relative: 17 %
Lymphs Abs: 0.4 10*3/uL — ABNORMAL LOW (ref 0.7–4.0)
MCH: 30.1 pg (ref 26.0–34.0)
MCHC: 32.4 g/dL (ref 30.0–36.0)
MCV: 92.9 fL (ref 80.0–100.0)
Monocytes Absolute: 0 10*3/uL — ABNORMAL LOW (ref 0.1–1.0)
Monocytes Relative: 1 %
Neutro Abs: 2.1 10*3/uL (ref 1.7–7.7)
Neutrophils Relative %: 82 %
Platelets: 161 10*3/uL (ref 150–400)
RBC: 4.52 MIL/uL (ref 3.87–5.11)
RDW: 12.9 % (ref 11.5–15.5)
WBC: 2.6 10*3/uL — ABNORMAL LOW (ref 4.0–10.5)
nRBC: 0 % (ref 0.0–0.2)

## 2022-06-18 LAB — URINALYSIS, W/ REFLEX TO CULTURE (INFECTION SUSPECTED)
Bilirubin Urine: NEGATIVE
Glucose, UA: NEGATIVE mg/dL
Ketones, ur: NEGATIVE mg/dL
Nitrite: NEGATIVE
Protein, ur: 30 mg/dL — AB
Specific Gravity, Urine: 1.008 (ref 1.005–1.030)
Squamous Epithelial / HPF: NONE SEEN /HPF (ref 0–5)
WBC, UA: >50 WBC/hpf (ref 0–5)
pH: 5 (ref 5.0–8.0)

## 2022-06-18 LAB — TSH: TSH: 1.017 u[IU]/mL (ref 0.350–4.500)

## 2022-06-18 LAB — COMPREHENSIVE METABOLIC PANEL
ALT: 20 U/L (ref 0–44)
AST: 37 U/L (ref 15–41)
Albumin: 3.8 g/dL (ref 3.5–5.0)
Alkaline Phosphatase: 129 U/L — ABNORMAL HIGH (ref 38–126)
Anion gap: 14 (ref 5–15)
BUN: 24 mg/dL — ABNORMAL HIGH (ref 8–23)
CO2: 19 mmol/L — ABNORMAL LOW (ref 22–32)
Calcium: 9.2 mg/dL (ref 8.9–10.3)
Chloride: 104 mmol/L (ref 98–111)
Creatinine, Ser: 1.47 mg/dL — ABNORMAL HIGH (ref 0.44–1.00)
GFR, Estimated: 36 mL/min — ABNORMAL LOW (ref >60)
Glucose, Bld: 111 mg/dL — ABNORMAL HIGH (ref 70–99)
Potassium: 3.8 mmol/L (ref 3.5–5.1)
Sodium: 137 mmol/L (ref 135–145)
Total Bilirubin: 1.3 mg/dL — ABNORMAL HIGH (ref 0.3–1.2)
Total Protein: 7.5 g/dL (ref 6.5–8.1)

## 2022-06-18 LAB — URINALYSIS, ROUTINE W REFLEX MICROSCOPIC
Bacteria, UA: NONE SEEN
Bilirubin Urine: NEGATIVE
Glucose, UA: NEGATIVE mg/dL
Ketones, ur: NEGATIVE mg/dL
Nitrite: NEGATIVE
Protein, ur: 30 mg/dL — AB
Specific Gravity, Urine: 1.006 (ref 1.005–1.030)
Squamous Epithelial / HPF: NONE SEEN /HPF (ref 0–5)
WBC, UA: >50 WBC/hpf (ref 0–5)
pH: 5 (ref 5.0–8.0)

## 2022-06-18 LAB — PROTIME-INR
INR: 1.1 (ref 0.8–1.2)
Prothrombin Time: 14 seconds (ref 11.4–15.2)

## 2022-06-18 LAB — LACTIC ACID, PLASMA
Lactic Acid, Venous: 5.4 mmol/L (ref 0.5–1.9)
Lactic Acid, Venous: 2.2 mmol/L (ref 0.5–1.9)

## 2022-06-18 LAB — TROPONIN I (HIGH SENSITIVITY)
Troponin I (High Sensitivity): 21 ng/L — ABNORMAL HIGH (ref <18)
Troponin I (High Sensitivity): 45 ng/L — ABNORMAL HIGH (ref <18)

## 2022-06-18 LAB — T4, FREE: Free T4: 0.96 ng/dL (ref 0.61–1.12)

## 2022-06-18 LAB — MAGNESIUM: Magnesium: 1.7 mg/dL (ref 1.7–2.4)

## 2022-06-18 LAB — PROCALCITONIN: Procalcitonin: 0.82 ng/mL

## 2022-06-18 MED ORDER — LEVOTHYROXINE SODIUM 50 MCG PO TABS
75.0000 ug | ORAL_TABLET | Freq: Every day | ORAL | Status: DC
  Administered 2022-06-19 – 2022-06-21 (×3): 75 ug via ORAL
  Filled 2022-06-18 (×3): qty 1

## 2022-06-18 MED ORDER — SODIUM CHLORIDE 0.9 % IV SOLN
2.0000 g | Freq: Once | INTRAVENOUS | Status: AC
  Administered 2022-06-18: 2 g via INTRAVENOUS
  Filled 2022-06-18: qty 12.5

## 2022-06-18 MED ORDER — HYDRALAZINE HCL 20 MG/ML IJ SOLN: 5.0000 mg | INTRAMUSCULAR | Status: DC | PRN

## 2022-06-18 MED ORDER — ADULT MULTIVITAMIN W/MINERALS CH
1.0000 | ORAL_TABLET | Freq: Every day | ORAL | Status: DC
  Administered 2022-06-19 – 2022-06-21 (×3): 1 via ORAL
  Filled 2022-06-18 (×3): qty 1

## 2022-06-18 MED ORDER — SODIUM CHLORIDE 0.9 % IV BOLUS
500.0000 mL | Freq: Once | INTRAVENOUS | Status: AC
  Administered 2022-06-18: 500 mL via INTRAVENOUS

## 2022-06-18 MED ORDER — METOPROLOL TARTRATE 5 MG/5ML IV SOLN: 5.0000 mg | INTRAVENOUS | Status: DC | PRN

## 2022-06-18 MED ORDER — VANCOMYCIN HCL 1250 MG/250ML IV SOLN
1250.0000 mg | Freq: Once | INTRAVENOUS | Status: AC
  Administered 2022-06-18: 1250 mg via INTRAVENOUS
  Filled 2022-06-18: qty 250

## 2022-06-18 MED ORDER — METRONIDAZOLE 500 MG/100ML IV SOLN
500.0000 mg | Freq: Once | INTRAVENOUS | Status: AC
  Administered 2022-06-18: 500 mg via INTRAVENOUS
  Filled 2022-06-18: qty 100

## 2022-06-18 MED ORDER — SODIUM CHLORIDE 0.9 % IV SOLN: INTRAVENOUS | Status: DC

## 2022-06-18 MED ORDER — POTASSIUM CHLORIDE CRYS ER 20 MEQ PO TBCR
40.0000 meq | EXTENDED_RELEASE_TABLET | Freq: Once | ORAL | Status: AC
  Administered 2022-06-18: 40 meq via ORAL
  Filled 2022-06-18: qty 2

## 2022-06-18 MED ORDER — MAGNESIUM SULFATE IN D5W 1-5 GM/100ML-% IV SOLN
1.0000 g | Freq: Once | INTRAVENOUS | Status: AC
  Administered 2022-06-18: 1 g via INTRAVENOUS
  Filled 2022-06-18: qty 100

## 2022-06-18 MED ORDER — FISH OIL 1000 MG PO CAPS: 1000.0000 mg | ORAL_CAPSULE | Freq: Every day | ORAL | Status: DC

## 2022-06-18 MED ORDER — ENOXAPARIN SODIUM 30 MG/0.3ML IJ SOSY
30.0000 mg | PREFILLED_SYRINGE | INTRAMUSCULAR | Status: DC
  Administered 2022-06-18 – 2022-06-20 (×3): 30 mg via SUBCUTANEOUS
  Filled 2022-06-18 (×3): qty 0.3

## 2022-06-18 MED ORDER — ASPIRIN 81 MG PO TBEC
81.0000 mg | DELAYED_RELEASE_TABLET | Freq: Every day | ORAL | Status: DC
  Administered 2022-06-19 – 2022-06-21 (×3): 81 mg via ORAL
  Filled 2022-06-18 (×3): qty 1

## 2022-06-18 MED ORDER — SODIUM CHLORIDE 0.9 % IV BOLUS
1000.0000 mL | Freq: Once | INTRAVENOUS | Status: AC
  Administered 2022-06-18: 1000 mL via INTRAVENOUS

## 2022-06-18 MED ORDER — ACETAMINOPHEN 325 MG PO TABS
650.0000 mg | ORAL_TABLET | Freq: Four times a day (QID) | ORAL | Status: DC | PRN
  Administered 2022-06-18 – 2022-06-19 (×2): 650 mg via ORAL
  Filled 2022-06-18 (×3): qty 2

## 2022-06-18 MED ORDER — SODIUM CHLORIDE 0.9 % IV SOLN: 1.0000 g | INTRAVENOUS | Status: DC

## 2022-06-18 MED ORDER — ALPHA-LIPOIC ACID 200 MG PO TABS: 200.0000 mg | ORAL_TABLET | Freq: Three times a day (TID) | ORAL | Status: DC

## 2022-06-18 MED ORDER — ACETAMINOPHEN 500 MG PO TABS
1000.0000 mg | ORAL_TABLET | Freq: Once | ORAL | Status: AC
  Administered 2022-06-18: 1000 mg via ORAL
  Filled 2022-06-18: qty 2

## 2022-06-18 MED ORDER — DILTIAZEM HCL 25 MG/5ML IV SOLN
10.0000 mg | Freq: Once | INTRAVENOUS | Status: AC
  Administered 2022-06-18: 10 mg via INTRAVENOUS
  Filled 2022-06-18: qty 5

## 2022-06-18 MED ORDER — OMEGA-3-ACID ETHYL ESTERS 1 G PO CAPS
1.0000 g | ORAL_CAPSULE | Freq: Every day | ORAL | Status: DC
  Administered 2022-06-19 – 2022-06-21 (×3): 1 g via ORAL
  Filled 2022-06-18 (×4): qty 1

## 2022-06-18 MED ORDER — VITAMIN D 25 MCG (1000 UNIT) PO TABS
1000.0000 [IU] | ORAL_TABLET | Freq: Every day | ORAL | Status: DC
  Administered 2022-06-19 – 2022-06-21 (×3): 1000 [IU] via ORAL
  Filled 2022-06-18 (×3): qty 1

## 2022-06-18 MED ORDER — PANTOPRAZOLE SODIUM 40 MG PO TBEC
40.0000 mg | DELAYED_RELEASE_TABLET | Freq: Every day | ORAL | Status: DC
  Administered 2022-06-18 – 2022-06-21 (×4): 40 mg via ORAL
  Filled 2022-06-18 (×4): qty 1

## 2022-06-18 MED ORDER — ONDANSETRON HCL 4 MG/2ML IJ SOLN: 4.0000 mg | Freq: Three times a day (TID) | INTRAMUSCULAR | Status: DC | PRN

## 2022-06-18 MED ORDER — ATORVASTATIN CALCIUM 20 MG PO TABS
20.0000 mg | ORAL_TABLET | Freq: Every day | ORAL | Status: DC
  Administered 2022-06-18 – 2022-06-20 (×3): 20 mg via ORAL
  Filled 2022-06-18 (×3): qty 1

## 2022-06-18 MED ORDER — PROBIOTIC DAILY PO CAPS: ORAL_CAPSULE | Freq: Every day | ORAL | Status: DC

## 2022-06-18 MED ORDER — DILTIAZEM HCL 60 MG PO TABS
60.0000 mg | ORAL_TABLET | Freq: Once | ORAL | Status: AC
  Administered 2022-06-18: 60 mg via ORAL
  Filled 2022-06-18: qty 1

## 2022-06-18 MED ORDER — GLUCOSAMINE CHONDR 500 COMPLEX PO CAPS: ORAL_CAPSULE | Freq: Every day | ORAL | Status: DC

## 2022-06-18 NOTE — Addendum Note (Signed)
Addended by: Alisa Graff on: 06/18/2022 08:19 PM   Modules accepted: Orders

## 2022-06-18 NOTE — H&P (Addendum)
History and Physical    Caitlin Lin [id number] DOB: [birth year] DOA: 06/18/2022  Referring MD/NP/PA:   PCP: Einar Pheasant, MD   Patient coming from:  The patient is coming from home.  At baseline, pt is independent for most of ADL.        Chief Complaint: Flank pain and increased urinary frequency  HPI: Caitlin Lin is a 82 y.o. female with medical history significant of CAD, HTN, HLD, hypothyroidism, CKD-4, SDH, aortic regurgitation, who presents with flank pain and increased urinary frequency.  Patient states that she has bilateral lower back and flank pain for more than 2 days.  The pain is aching, mild to moderate, radiating to the groin areas  She has suprapubic area pressure feeding.  Has increased urinary frequency, denies dysuria or burning on urination.  No hematuria.  Patient has low-grade fever and chills.  Patient does not have nausea, vomiting, diarrhea or abdominal pain.  No chest pain, cough, short of breath.  Patient was found to have new onset atrial fibrillation with heart rate up to 186, which is converted to sinus rhythm after giving 10 mg of IV Cardizem and 60 mg of oral Cardizem in ED. current heart rate 90-100s.    Data reviewed independently and ED Course: pt was found to have WBC 2.6, lactic acid 5.4 --> 2.2, positive urinalysis (cloudy appearance, large amount of leukocyte, negative bacteria, WBC> 50), troponin 21 --> 45,  renal function close to baseline, TSH 1.017, temperature 99.7, blood pressure 116/59, RR 35, oxygen saturation 99% on room air.  Chest x-ray negative.  Patient is admitted to PCU as inpatient.  Dr. Fletcher Anon of cardiology is consulted.   EKG: I have personally reviewed.  Atrial fibrillation with RVR, heart rate 192.  Review of Systems:   General: no fevers, chills, no body weight gain, has fatigue HEENT: no blurry vision, hearing changes or sore throat Respiratory: no dyspnea, coughing, wheezing CV: no chest pain, no palpitations GI:  no nausea, vomiting, abdominal pain, diarrhea, constipation GU: no dysuria, burning on urination, has increased urinary frequency, no hematuria  Ext: has trace leg edema Neuro: no unilateral weakness, numbness, or tingling, no vision change or hearing loss Skin: no rash, no skin tear. MSK: No muscle spasm, no deformity, no limitation of range of movement in spin. Has bilateral lower back and flank pain Heme: No easy bruising.  Travel history: No recent long distant travel.   Allergy:  Allergies  Allergen Reactions   Quinapril Other (See Comments)    Hyperkalemia   Codeine Rash    Past Medical History:  Diagnosis Date   Cataracts, bilateral    Chronic kidney disease    Followed by Dr. Holley Raring   Chronic kidney disease    Colon polyp    Constipation due to slow transit    Diarrhea    Diarrhea    GERD (gastroesophageal reflux disease)    Heart murmur    History of cyst of breast    History of hiatal hernia    Hyperlipidemia    Hypertension    Hypothyroidism    Melanoma of lower leg ([place]) 2013   Shingles 2013   Shingles    Spinal stenosis in cervical region    Dr. Sharlet Salina   Subdural hematoma [facility])    Thyroid disease     Past Surgical History:  Procedure Laterality Date   ABDOMINAL HYSTERECTOMY  1974   ANTERIOR AND POSTERIOR VAGINAL REPAIR     ANTERIOR AND POSTERIOR  VAGINAL REPAIR W/ SACROSPINOUS LIGAMENT SUSPENSION Right    BREAST BIOPSY  1963   Benign per pt's report   BREAST EXCISIONAL BIOPSY Right 1963   neg   COLONOSCOPY     COLONOSCOPY N/A 10/29/2021   Procedure: COLONOSCOPY;  Surgeon: Toledo, Benay Pike, MD;  Location: [facility] ENDOSCOPY;  Service: Gastroenterology;  Laterality: N/A;   ELECTROMAGNETIC NAVIGATION BROCHOSCOPY Right 01/09/2019   Procedure: ELECTROMAGNETIC NAVIGATION BRONCHOSCOPY;  Surgeon: Tyler Pita, MD;  Location: [facility] ORS;  Service: Cardiopulmonary;  Laterality: Right;   ESOPHAGOGASTRODUODENOSCOPY     ESOPHAGOGASTRODUODENOSCOPY (EGD) WITH  PROPOFOL N/A 11/10/2016   Procedure: ESOPHAGOGASTRODUODENOSCOPY (EGD) WITH PROPOFOL;  Surgeon: Lollie Sails, MD;  [facility];  Service: Endoscopy;  Laterality: N/A;   ESOPHAGOGASTRODUODENOSCOPY (EGD) WITH PROPOFOL N/A 09/09/2018   Procedure: ESOPHAGOGASTRODUODENOSCOPY (EGD) WITH PROPOFOL;  Surgeon: Lollie Sails, MD;  Location: [facility] ENDOSCOPY;  Service: Endoscopy;  Laterality: N/A;   ESOPHAGOGASTRODUODENOSCOPY (EGD) WITH PROPOFOL N/A 10/27/2019   Procedure: ESOPHAGOGASTRODUODENOSCOPY (EGD) WITH PROPOFOL;  Surgeon: Robert Bellow, MD;  Location: [facility] ENDOSCOPY;  Service: Endoscopy;  Laterality: N/A;   ESOPHAGOGASTRODUODENOSCOPY (EGD) WITH PROPOFOL N/A 08/09/2020   Procedure: ESOPHAGOGASTRODUODENOSCOPY (EGD) WITH PROPOFOL;  Surgeon: Lesly Rubenstein, MD;  Location: [facility] ENDOSCOPY;  Service: Endoscopy;  Laterality: N/A;   LUNG BIOPSY  2007   Benign per pt's report   LUNG BIOPSY Right    OOPHORECTOMY  1970   PERINEOPLASTY  12/17/2013   sling for stress incontinence  12/18/2011    Social History:  reports that she has never smoked. She has never used smokeless tobacco. She reports that she does not drink alcohol and does not use drugs.  Family History:  Family History  Problem Relation Age of Onset   Arthritis Mother    Liver disease Mother    Cirrhosis Mother    Liver cancer Mother    Heart disease Father    Heart attack Father    Coronary artery disease Father    Depression Brother    Prostate cancer Brother    Diabetes Maternal Aunt    Breast cancer Cousin        maternal cousins   Liver cancer Cousin    Breast cancer Other      Prior to Admission medications   Medication Sig Start Date End Date Taking? Authorizing Provider  Alpha-Lipoic Acid 200 MG TABS Take 200 mg by mouth 3 (three) times daily.    [provider]  amLODipine (NORVASC) 5 MG tablet Take 1 tablet (5 mg total) by mouth daily. 03/26/22   Einar Pheasant, MD  aspirin 81 MG  tablet Take 1 tablet (81 mg total) by mouth daily. 10/15/15   Jackolyn Confer, MD  atorvastatin (LIPITOR) 20 MG tablet Take 1 tablet (20 mg total) by mouth daily. 03/26/22   Einar Pheasant, MD  Cholecalciferol 25 MCG (1000 UT) tablet Take by mouth.    [provider]  Docusate Calcium (STOOL SOFTENER PO) Take by mouth.    [provider]  fluticasone Asencion Islam) 50 MCG/ACT nasal spray  10/06/21   [provider]  Glucosamine-Chondroit-Vit C-Mn (GLUCOSAMINE CHONDR 500 COMPLEX PO) Take 2 tablets by mouth daily.    [provider]  levothyroxine (SYNTHROID) 75 MCG tablet TAKE 1 TABLET BY MOUTH ONCE DAILY IN THE MORNING BEFORE BREAKFAST 03/26/22   Einar Pheasant, MD  losartan (COZAAR) 25 MG tablet Take 1 tablet (25 mg total) by mouth daily. 03/26/22   Einar Pheasant, MD  Multiple Vitamin (MULTIVITAMIN)  tablet Take 1 tablet by mouth daily.    [provider]  Omega-3 Fatty Acids (FISH OIL) 1000 MG CAPS Take 1,000 mg by mouth daily.    [provider]  pantoprazole (PROTONIX) 40 MG tablet Take 1 tablet (40 mg total) by mouth daily. Take 30 minutes before breakfast 03/26/22   Einar Pheasant, MD  Polyethylene Glycol 3350 (MIRALAX PO) Take by mouth as needed.    [provider]  Probiotic Product (PROBIOTIC DAILY PO) Take by mouth daily.    [provider]  Wheat Dextrin (BENEFIBER PO) Take by mouth.    [provider]    Physical Exam: Vitals:   06/18/22 1720 06/18/22 1730 06/18/22 1735 06/18/22 1740  BP: 119/68 (!) 107/57  113/64  Pulse: 87 84 86 86  Resp: (!) 21 (!) 30 (!) 27 (!) 27  Temp:   99.3 F (37.4 C)   TempSrc:   Oral   SpO2: 100% 95% 95% 96%  Weight:       General: Not in acute distress HEENT:       Eyes: PERRL, EOMI, no scleral icterus.       ENT: No discharge from the ears and nose, no pharynx injection, no tonsillar enlargement.        Neck: No JVD, no bruit, no mass felt. Heme: No neck lymph node  enlargement. Cardiac: S1/S2, RRR, No murmurs, No gallops or rubs. Respiratory: No rales, wheezing, rhonchi or rubs. GI: Soft, nondistended, nontender, no rebound pain, no organomegaly, BS present. GU: No hematuria Ext: has trace leg edema bilaterally. 1+DP/PT pulse bilaterally. Musculoskeletal: No joint deformities, No joint redness or warmth, no limitation of ROM in spin. Skin: No rashes.  Neuro: Alert, oriented X3, cranial nerves II-XII grossly intact, moves all extremities normally. Psych: Patient is not psychotic, no suicidal or hemocidal ideation.  Labs on Admission: I have personally reviewed following labs and imaging studies  CBC: Recent Labs  Lab 06/18/22 1348  WBC 2.6*  NEUTROABS 2.1  HGB 13.6  HCT 42.0  MCV 92.9  PLT [id number]   Basic Metabolic Panel: Recent Labs  Lab 06/18/22 1348  NA 137  K 3.8  CL 104  CO2 19*  GLUCOSE 111*  BUN 24*  CREATININE 1.47*  CALCIUM 9.2  MG 1.7   GFR: Estimated Creatinine Clearance: 28.4 mL/min (A) (by C-G formula based on SCr of 1.47 mg/dL (H)). Liver Function Tests: Recent Labs  Lab 06/18/22 1348  AST 37  ALT 20  ALKPHOS 129*  BILITOT 1.3*  PROT 7.5  ALBUMIN 3.8   No results for input(s): "LIPASE", "AMYLASE" in the last 168 hours. No results for input(s): "AMMONIA" in the last 168 hours. Coagulation Profile: Recent Labs  Lab 06/18/22 1348  INR 1.1   Cardiac Enzymes: No results for input(s): "CKTOTAL", "CKMB", "CKMBINDEX", "TROPONINI" in the last 168 hours. BNP (last 3 results) No results for input(s): "PROBNP" in the last 8760 hours. HbA1C: No results for input(s): "HGBA1C" in the last 72 hours. CBG: No results for input(s): "GLUCAP" in the last 168 hours. Lipid Profile: No results for input(s): "CHOL", "HDL", "LDLCALC", "TRIG", "CHOLHDL", "LDLDIRECT" in the last 72 hours. Thyroid Function Tests: Recent Labs    06/18/22 1348  TSH 1.017  FREET4 0.96   Anemia Panel: No results for input(s): "VITAMINB12",  "FOLATE", "FERRITIN", "TIBC", "IRON", "RETICCTPCT" in the last 72 hours. Urine analysis:    Component Value Date/Time   COLORURINE YELLOW (A) 06/18/2022 1355   APPEARANCEUR CLOUDY (A) 06/18/2022  1355   LABSPEC 1.006 06/18/2022 1355   PHURINE 5.0 06/18/2022 [place] 06/18/2022 1355   GLUCOSEU NEGATIVE 07/22/2018 0839   HGBUR MODERATE (A) 06/18/2022 1355   BILIRUBINUR NEGATIVE 06/18/2022 1355   BILIRUBINUR neg 03/07/2012 1043   KETONESUR NEGATIVE 06/18/2022 1355   PROTEINUR 30 (A) 06/18/2022 1355   UROBILINOGEN 0.2 07/22/2018 0839   NITRITE NEGATIVE 06/18/2022 1355   LEUKOCYTESUR LARGE (A) 06/18/2022 1355   Sepsis Labs: @LABRCNTIP (procalcitonin:4,lacticidven:4) ) Recent Results (from the past 240 hour(s))  Culture, blood (Routine x 2)     Status: None (Preliminary result)   Collection Time: 06/18/22  1:48 PM   Specimen: BLOOD  Result Value Ref Range Status   Specimen Description BLOOD LEFT AC  Final   Special Requests   Final    BOTTLES DRAWN AEROBIC AND ANAEROBIC Blood Culture results may not be optimal due to an excessive volume of blood received in culture bottles   Culture   Final    NO GROWTH <12 HOURS Performed at [facility], [address], [place], [place] [address]    Report Status PENDING  Incomplete  Culture, blood (Routine x 2)     Status: None (Preliminary result)   Collection Time: 06/18/22  1:48 PM   Specimen: BLOOD  Result Value Ref Range Status   Specimen Description BLOOD RIGHT [facility]  Final   Special Requests   Final    BOTTLES DRAWN AEROBIC AND ANAEROBIC Blood Culture adequate volume   Culture   Final    NO GROWTH <12 HOURS Performed at [facility], [address], [place], [place] [address]    Report Status PENDING  Incomplete     Radiological Exams on Admission: CT CHEST ABDOMEN PELVIS WO CONTRAST  Result Date: 06/18/2022 CLINICAL DATA:  Sepsis.  Lower abdominal and back pain. EXAM: CT CHEST, ABDOMEN AND PELVIS  WITHOUT CONTRAST TECHNIQUE: Multidetector CT imaging of the chest, abdomen and pelvis was performed following the standard protocol without IV contrast. RADIATION DOSE REDUCTION: This exam was performed according to the departmental dose-optimization program which includes automated exposure control, adjustment of the mA and/or kV according to patient size and/or use of iterative reconstruction technique. COMPARISON:  04/23/2022 FINDINGS: CT CHEST FINDINGS Cardiovascular: Heart is normal size. Aorta is normal caliber. Moderate coronary artery and aortic calcifications. Mediastinum/Nodes: No mediastinal, hilar, or axillary adenopathy. Trachea and esophagus are unremarkable. Thyroid unremarkable. Lungs/Pleura: Small calcified granuloma posteriorly in the right upper lobe as well as in the lingula. Linear areas of scarring in the lungs bilaterally. No confluent airspace opacities or effusions. Musculoskeletal: Chest wall soft tissues are unremarkable. No acute bony abnormality. CT ABDOMEN PELVIS FINDINGS Hepatobiliary: No focal hepatic abnormality. Gallbladder unremarkable. Pancreas: No focal abnormality or ductal dilatation. Spleen: No focal abnormality.  Normal size. Adrenals/Urinary Tract: Adrenal glands normal. Bilateral renal extrarenal pelves. No frank hydronephrosis. No stones or mass. Urinary bladder unremarkable. Small amount of gas in the bladder lumen, presumably from recent catheterization. Stomach/Bowel: Sigmoid diverticulosis. No active diverticulitis. Stomach and small bowel decompressed, unremarkable. Vascular/Lymphatic: Aortic atherosclerosis. No evidence of aneurysm or adenopathy. Reproductive: Prior hysterectomy.  No adnexal masses. Other: No free fluid or free air. Musculoskeletal: Lucent lesion within the T12 vertebral body, stable since 2021 most compatible with a benign process. Diffuse degenerative changes and scoliosis. No acute bony abnormality. IMPRESSION: Coronary artery disease, aortic  atherosclerosis. Evidence of old granulomatous disease and scarring in the lungs. Sigmoid diverticulosis.  No active diverticulitis. No acute findings in  the chest, abdomen or pelvis. Electronically Signed   By: Rolm Baptise M.D.   On: 06/18/2022 15:13   DG Chest Port 1 View  Result Date: 06/18/2022 CLINICAL DATA:  Fever, headache, elevated heart rate. EXAM: PORTABLE CHEST 1 VIEW COMPARISON:  Chest radiograph 11/27/2020 FINDINGS: The heart is enlarged, unchanged. The upper mediastinal contours are normal. There is no focal consolidation or pulmonary edema. There is no pleural effusion or pneumothorax There is no acute osseous abnormality. IMPRESSION: No radiographic evidence of acute cardiopulmonary process. Electronically Signed   By: Valetta Mole M.D.   On: 06/18/2022 14:41      Assessment/Plan Principal Problem:   Atrial fibrillation with RVR Active Problems:   Myocardial injury   Acute pyelonephritis   Severe sepsis   Hypertension   CKD (chronic kidney disease) stage 4, GFR 15-29 ml/min   Hypothyroidism   Hypercholesterolemia   Assessment and Plan:  Atrial fibrillation with RVR: initially HR was upto to 180s. Pt was given 10 mg of IV Cardizem and 60 mg of oral Cardizem, converted to sinus rhythm.  Currently heart rate is 90- 100s.  This is likely triggered by severe sepsis.  TSH 1.017. CHA2DS2-VASc Score is 5. Since pt is already converted to sinus rhythm now, I will not start anticoagulants now.  Consulted with Dr. Fletcher Anon of card --> will see pt in AM  -will admit to PCU as inpt -As needed metoprolol 5 mg every 2 hour for heart rate> 125 -Check magnesium level --> 1.7. Will give 1g of magnesium sulfate to maintain magnesium level above 2. -K is 3.8 --> give 40 mEq of Kdur to maintain potassium level above 4  Myocardial injury: trop 21 --> 45. No CP -trend trop -check A1c and FLP  -ASA and Lipitor  Severe sepsis due to acute pyelonephritis: pt has severe sepsis with WBC 2.6,  tachycardia, tachypnea with RR up to 35.  Lactic acid 5.4 --> 2.2. -IV Rocephin (patient received 1 dose of vancomycin, Flagyl and cefepime in ED) -Follow-up blood culture and urine culture -Trend lactic acid level, check procalcitonin level -IV fluid: 2.5 L normal saline, then 75 cc/h   Hypertension -IV hydralazine as needed -Hold amlodipine and Micardis since patient is at high risk of developing hypotension due to severe sepsis  CKD (chronic kidney disease) stage 4, GFR 15-29 ml/min: Renal function close to baseline.  Recent baseline creatinine 1.5-1.6.  Her creatinine is 1.47, BUN 24, GFR 36 -Follow-up with BMP  Hypothyroidism -Continue Synthroid  Hypercholesterolemia -Lipitor     DVT ppx: SQ Lovenox  Code Status: Full code  Family Communication: not done, no family member is at bed side.        Disposition Plan:  Anticipate discharge back to previous environment  Consults called:  Dr. Fletcher Anon of cardiology is consulted.  Admission status and Level of care: Progressive   as inpt      Dispo: The patient is from: Home              Anticipated d/c is to: Home              Anticipated d/c date is: 2 days              Patient currently is not medically stable to d/c.    Severity of Illness:  The appropriate patient status for this patient is INPATIENT. Inpatient status is judged to be reasonable and necessary in order to provide the required intensity of service to ensure the  patient's safety. The patient's presenting symptoms, physical exam findings, and initial radiographic and laboratory data in the context of their chronic comorbidities is felt to place them at high risk for further clinical deterioration. Furthermore, it is not anticipated that the patient will be medically stable for discharge from the hospital within 2 midnights of admission.   * I certify that at the point of admission it is my clinical judgment that the patient will require inpatient hospital care  spanning beyond 2 midnights from the point of admission due to high intensity of service, high risk for further deterioration and high frequency of surveillance required.*       Date of Service 06/18/2022    Ivor Costa [facility]   If 7PM-7AM, please contact night-coverage [email or web address] 06/18/2022, 5:53 PM

## 2022-06-18 NOTE — ED Triage Notes (Signed)
First Nurse note:  Arrives from [facility] for ED evaluation for lower abd and back pain x 2 days.  Per report, patient was near syncopal in clinic.  Arrives AAOx3

## 2022-06-18 NOTE — Telephone Encounter (Signed)
Orders placed for labs

## 2022-06-18 NOTE — ED Notes (Addendum)
CRITICAL TEST RESULT Troponin 45 (Increase from 21 previously) Dr. Blaine Hamper notified [phone]

## 2022-06-18 NOTE — Telephone Encounter (Signed)
Labs ordered.

## 2022-06-18 NOTE — ED Notes (Signed)
CRITICAL LAB RESULT Lactic acid 5.4 Dr. Jacelyn Grip notified [phone]

## 2022-06-18 NOTE — ED Triage Notes (Signed)
Pt sts that two days ago she started to have lower back pain and abd pain. Today, pt comes from [facility] urgent care.

## 2022-06-18 NOTE — Progress Notes (Signed)
PHARMACIST - PHYSICIAN ORDER COMMUNICATION  CONCERNING: P&T Medication Policy on Herbal Medications  DESCRIPTION:  This patient's orders for:  Probiotic capsules, Alpha-Lipoic Acid, and Glucosamine Chondroitin  has been noted.  This product(s) is classified as an "herbal" or natural product. Due to a lack of definitive safety studies or FDA approval, nonstandard manufacturing practices, plus the potential risk of unknown drug-drug interactions while on inpatient medications, the Pharmacy and Therapeutics Committee does not permit the use of "herbal" or natural products of this type within [facility].   ACTION TAKEN: The pharmacy department is unable to verify this order at this time and your patient has been informed of this safety policy. Please reevaluate patient's clinical condition at discharge and address if the herbal or natural product(s) should be resumed at that time.

## 2022-06-18 NOTE — ED Notes (Signed)
ED Provider at bedside. 

## 2022-06-18 NOTE — Addendum Note (Signed)
Addended by: Roetta Sessions D on: 06/18/2022 03:50 PM   Modules accepted: Orders

## 2022-06-18 NOTE — Progress Notes (Signed)
PHARMACY -  BRIEF ANTIBIOTIC NOTE   Pharmacy has received consult(s) for vancomycin from an ED provider.  The patient's profile has been reviewed for ht/wt/allergies/indication/available labs.    One time order(s) placed for vancomycin 1250 mg IV x 1  Further antibiotics/pharmacy consults should be ordered by admitting physician if indicated.                       Thank you, Dallie Piles 06/18/2022  2:44 PM

## 2022-06-18 NOTE — Telephone Encounter (Signed)
Patient has a lab a lab appt 06/25/2022, there are No in.

## 2022-06-18 NOTE — ED Provider Notes (Signed)
[facility] Provider Note    Event Date/Time   First MD Initiated Contact with Patient 06/18/22 1342     (approximate)   History   Abdominal Pain   HPI  Caitlin Lin is a 82 y.o. female   Past medical history of CKD, GERD, hypertension, hyperlipidemia, hypothyroid on Synthroid, who presents to the emergency department with bilateral flank pain, suprapubic pain, urinary frequency and pressure when urinating starting last night worsening this morning.  Low-grade fever.  Heart palpitations.  Denies nausea vomiting or diarrhea.    She has had no cough congestion  She does feel exertional dyspnea when walking in the setting of her palpitations this morning. Feels generalized weakness.  She had cold symptoms that resolved 1 week ago.  External Medical Documents Reviewed: Office visit from internal medicine dated earlier today denoting central abdominal pressure and difficulty with urination aching back pain      Physical Exam   Triage Vital Signs: ED Triage Vitals  Enc Vitals Group     BP 06/18/22 1333 (!) 140/109     Pulse Rate 06/18/22 1333 (!) 184     Resp 06/18/22 1333 (!) 27     Temp 06/18/22 1333 99.9 F (37.7 C)     Temp Source 06/18/22 1333 Oral     SpO2 06/18/22 1333 94 %     Weight 06/18/22 1331 150 lb (68 kg)     Height --      Head Circumference --      Peak Flow --      Pain Score 06/18/22 1331 9     Pain Loc --      Pain Edu? --      Excl. in [place]? --     Most recent vital signs: Vitals:   06/18/22 1445 06/18/22 1532  BP: 122/77 (!) 116/59  Pulse: (!) 149   Resp: (!) 35   Temp:    SpO2: 99%     General: Awake, no distress.  CV:  Good peripheral perfusion.  Resp:  Normal effort.  Abd:  No distention.  Other:  Mild bilateral CVA tenderness with no midline tenderness deformities or step-offs, mild suprapubic tenderness to palpation and she is tachycardic 180s irregular and normotensive and respiratory rate of 27 with  a temperature of 99.9 Fahrenheit.  She is awake alert oriented comfortable nontoxic.   ED Results / Procedures / Treatments   Labs (all labs ordered are listed, but only abnormal results are displayed) Labs Reviewed  COMPREHENSIVE METABOLIC PANEL - Abnormal; Notable for the following components:      Result Value   CO2 19 (*)    Glucose, Bld 111 (*)    BUN 24 (*)    Creatinine, Ser 1.47 (*)    Alkaline Phosphatase 129 (*)    Total Bilirubin 1.3 (*)    GFR, Estimated 36 (*)    All other components within normal limits  LACTIC ACID, PLASMA - Abnormal; Notable for the following components:   Lactic Acid, Venous 5.4 (*)    All other components within normal limits  CBC WITH DIFFERENTIAL/PLATELET - Abnormal; Notable for the following components:   WBC 2.6 (*)    Lymphs Abs 0.4 (*)    Monocytes Absolute 0.0 (*)    All other components within normal limits  URINALYSIS, ROUTINE W REFLEX MICROSCOPIC - Abnormal; Notable for the following components:   Color, Urine YELLOW (*)    APPearance CLOUDY (*)    Hgb  urine dipstick MODERATE (*)    Protein, ur 30 (*)    Leukocytes,Ua LARGE (*)    All other components within normal limits  TROPONIN I (HIGH SENSITIVITY) - Abnormal; Notable for the following components:   Troponin I (High Sensitivity) 21 (*)    All other components within normal limits  CULTURE, BLOOD (ROUTINE X 2)  CULTURE, BLOOD (ROUTINE X 2)  PROTIME-INR  TSH  T4, FREE  LACTIC ACID, PLASMA  TROPONIN I (HIGH SENSITIVITY)     I ordered and reviewed the above labs they are notable for white blood cell count is low at 2.6 and her GFR is in the 30s, consistent with baseline she has leukocytes as well as high white blood cell count in her urine  EKG  ED ECG REPORT I, Lucillie Garfinkel, the attending physician, personally viewed and interpreted this ECG.  Date: 06/18/2022 Her initial EKG shows atrial fibrillation with RVR in the 170s to 80s, normal axis and no signs of acute  ischemic changes.   RADIOLOGY I independently reviewed and interpreted chest x-ray and see no obvious focality or pneumothorax   PROCEDURES:  Critical Care performed: Yes, see critical care procedure note(s)  .Critical Care  Performed by: Lucillie Garfinkel, MD Authorized by: Lucillie Garfinkel, MD   Critical care provider statement:    Critical care time (minutes):  30   Critical care was time spent personally by me on the following activities:  Development of treatment plan with patient or surrogate, discussions with consultants, evaluation of patient's response to treatment, examination of patient, ordering and review of laboratory studies, ordering and review of radiographic studies, ordering and performing treatments and interventions, pulse oximetry, re-evaluation of patient's condition and review of old [place] ED: Medications  vancomycin (VANCOREADY) IVPB 1250 mg/250 mL (has no administration in time range)  acetaminophen (TYLENOL) tablet 1,000 mg (1,000 mg Oral Given 06/18/22 1415)  sodium chloride 0.9 % bolus 1,000 mL (0 mLs Intravenous Stopped 06/18/22 1509)  sodium chloride 0.9 % bolus 1,000 mL (0 mLs Intravenous Stopped 06/18/22 1548)  diltiazem (CARDIZEM) injection 10 mg (10 mg Intravenous Given 06/18/22 1444)  ceFEPIme (MAXIPIME) 2 g in sodium chloride 0.9 % 100 mL IVPB (0 g Intravenous Stopped 06/18/22 1513)  metroNIDAZOLE (FLAGYL) IVPB 500 mg (500 mg Intravenous New Bag/Given 06/18/22 1509)  diltiazem (CARDIZEM) tablet 60 mg (60 mg Oral Given 06/18/22 1532)    External physician / consultants:  I spoke with hospitalist for admission and regarding care plan for this patient.   IMPRESSION / MDM / ASSESSMENT AND PLAN / ED COURSE  I reviewed the triage vital signs and the nursing notes.                                Patient's presentation is most consistent with acute presentation with potential threat to life or bodily function.  Differential diagnosis  includes, but is not limited to, sepsis, urinary tract infection, intra-abdominal infection, atrial fibrillation with RVR, ACS   The patient is on the cardiac monitor to evaluate for evidence of arrhythmia and/or significant heart rate changes.  MDM:    Concern for sepsis given tachycardia, tachypnea, low-grade fever and most likely source is urinary given flank pain and dysuria/frequency and suprapubic pain.  She has a new atrial fibrillation with RVR and rather than initially pharmacologically rate controlling her started with pain control, antipyretics and fluid bolus and this  helped her rate improved to the 150s she remains normotensive and so I then ordered for an IV dose of 10 mg diltiazem for which she converted to normal sinus rhythm in the low 100s.  Repeat EKG was captured.  I followed this up with a p.o. dose of diltiazem 60 mg.  Patient remained stable.  For sepsis I have ordered her 30 cc/kg ideal body weight crystalloid bolus as well as broad-spectrum antibiotics vancomycin, cefepime, Flagyl.  CT scan shows no surgical abdominal pathology and no source of infection from an imaging perspective but her urinalysis shows leukocytes and white blood cell count digestive of urinary tract infection source in this correlates well with her symptoms.   SEPSIS REEVALUATION -patient doing markedly better, remains normotensive and vital signs of improved with respiratory rate now in the 20s and her heart rate in the low 100s, feels much improved        FINAL CLINICAL IMPRESSION(S) / ED DIAGNOSES   Final diagnoses:  Urinary tract infection without hematuria, site unspecified  Sepsis, due to unspecified organism, unspecified whether acute organ dysfunction present  Atrial fibrillation with rapid ventricular response     Rx / DC Orders   ED Discharge Orders     None        Note:  This document was prepared using Dragon voice recognition software and may include unintentional  dictation errors.    Lucillie Garfinkel, MD 06/18/22 [phone]

## 2022-06-18 NOTE — Consult Note (Signed)
PHARMACIST - PHYSICIAN COMMUNICATION  CONCERNING:  Enoxaparin (Lovenox) for DVT Prophylaxis    RECOMMENDATION: Patient was prescribed enoxaprin 40mg  q24 hours for VTE prophylaxis.   Filed Weights   06/18/22 1331  Weight: 68 kg (150 lb)    Body mass index is 25.75 kg/m.  Estimated Creatinine Clearance: 28.4 mL/min (A) (by C-G formula based on SCr of 1.47 mg/dL (H)).   Based on [place] patient is candidate for enoxaparin 0.5mg /kg TBW SQ every 24 hours based on BMI being >30.  Patient is candidate for enoxaparin 30mg  every 24 hours based on CrCl <15ml/min  DESCRIPTION: Pharmacy has adjusted enoxaparin dose per [facility] policy.  Patient is now receiving enoxaparin 30 mg every 24 hours    Will M. Ouida Sills, PharmD PGY-1 Pharmacy Resident 06/18/2022 4:29 PM

## 2022-06-19 DIAGNOSIS — R6520 Severe sepsis without septic shock: Secondary | ICD-10-CM | POA: Diagnosis not present

## 2022-06-19 DIAGNOSIS — A419 Sepsis, unspecified organism: Secondary | ICD-10-CM | POA: Diagnosis not present

## 2022-06-19 DIAGNOSIS — I2510 Atherosclerotic heart disease of native coronary artery without angina pectoris: Secondary | ICD-10-CM | POA: Insufficient documentation

## 2022-06-19 LAB — BLOOD CULTURE ID PANEL (REFLEXED) - BCID2

## 2022-06-19 LAB — CBC
HCT: 31.4 % — ABNORMAL LOW (ref 36.0–46.0)
Hemoglobin: 10.4 g/dL — ABNORMAL LOW (ref 12.0–15.0)
MCH: 30.3 pg (ref 26.0–34.0)
MCHC: 33.1 g/dL (ref 30.0–36.0)
MCV: 91.5 fL (ref 80.0–100.0)
Platelets: 123 10*3/uL — ABNORMAL LOW (ref 150–400)
RBC: 3.43 MIL/uL — ABNORMAL LOW (ref 3.87–5.11)
RDW: 13.4 % (ref 11.5–15.5)
WBC: 16.7 10*3/uL — ABNORMAL HIGH (ref 4.0–10.5)
nRBC: 0 % (ref 0.0–0.2)

## 2022-06-19 LAB — BASIC METABOLIC PANEL
Anion gap: 7 (ref 5–15)
BUN: 25 mg/dL — ABNORMAL HIGH (ref 8–23)
CO2: 17 mmol/L — ABNORMAL LOW (ref 22–32)
Calcium: 8.1 mg/dL — ABNORMAL LOW (ref 8.9–10.3)
Chloride: 117 mmol/L — ABNORMAL HIGH (ref 98–111)
Creatinine, Ser: 1.46 mg/dL — ABNORMAL HIGH (ref 0.44–1.00)
GFR, Estimated: 36 mL/min — ABNORMAL LOW (ref >60)
Glucose, Bld: 99 mg/dL (ref 70–99)
Potassium: 4.4 mmol/L (ref 3.5–5.1)
Sodium: 141 mmol/L (ref 135–145)

## 2022-06-19 LAB — LACTIC ACID, PLASMA: Lactic Acid, Venous: 1.2 mmol/L (ref 0.5–1.9)

## 2022-06-19 MED ORDER — SODIUM CHLORIDE 0.9 % IV SOLN
2.0000 g | INTRAVENOUS | Status: DC
  Administered 2022-06-19 – 2022-06-21 (×3): 2 g via INTRAVENOUS
  Filled 2022-06-19 (×3): qty 20

## 2022-06-19 MED ORDER — METOPROLOL TARTRATE 25 MG PO TABS
12.5000 mg | ORAL_TABLET | Freq: Two times a day (BID) | ORAL | Status: AC
  Administered 2022-06-19 – 2022-06-20 (×4): 12.5 mg via ORAL
  Filled 2022-06-19 (×4): qty 1

## 2022-06-19 NOTE — Hospital Course (Addendum)
Caitlin Lin is a 82 y.o. female with medical history significant of CAD, HTN, HLD, hypothyroidism, CKD-4, SDH, aortic regurgitation, who presents with flank pain and increased urinary frequency x2+ days.  04/04: new onset atrial fibrillation with heart rate up to 186, which is converted to sinus rhythm after giving 10 mg of IV Cardizem and 60 mg of oral Cardizem in ED. --> heart rate 90-100s. WBC 2.6, lactic acid 5.4 --> 2.2, positive urinalysis (cloudy appearance, large amount of leukocyte, negative bacteria, WBC> 50), troponin 21 --> 45,  renal function close to baseline, TSH 1.017, temperature 99.7, blood pressure 116/59, RR 35, oxygen saturation 99% on room air.  Chest x-ray negative.  Patient is admitted to PCU as inpatient.  Dr. Kirke Corin of cardiology is consulted.  04/05: remains tachypneic, BP soft but not severely hypotensive. WBC up to 16.7 = still meets sepsis criteria. (+)BCx EColi, pending susceptibilities. 04/06: WBC down, renal fxn stable, VSS. BP up, restarting home meds, Pending susceptibilities for culture   Consultants:  Cardiology - [facility]  Procedures: none  Antibiotics:  04/04 in ED - cefepime, metronidazole, vancomycin x1 04/05: ceftriaxone 2 g IV q24h    ASSESSMENT & PLAN:   Principal Problem:   Severe sepsis Active Problems:   Atrial fibrillation with RVR   Myocardial injury   Acute pyelonephritis   Hypertension   CKD (chronic kidney disease) stage 4, GFR 15-29 ml/min   Hypothyroidism   Hypercholesterolemia   Aortic atherosclerosis   Coronary artery disease by CT  Severe sepsis due to acute pyelonephritis:  pt has severe sepsis on admission WBC 2.6, tachycardia, tachypnea with RR up to 35.  Lactic acid 5.4 --> 2.2. IV Rocephin (patient received 1 dose of vancomycin, Flagyl and cefepime in ED) Follow-up blood culture --> (+)ecoli  Follow-up urine culture --> pend suscpetibilities Trend lactic acid level --> now WNL check procalcitonin level IV fluid: 2.5  L normal saline, then 75 cc/h  Atrial fibrillation with RVR: likely triggered by severe sepsis.   CHA2DS2-VASc Score is 5.  Since pt is already converted to sinus rhythm, did not start anticoagulants now.  No anticoagulation needed unless Afib returns while adequately treated for sepsis  Cardiology following  Zio monitor on discharge  As needed metoprolol 5 mg every 2 hour for heart rate> 125 maintain magnesium level above 2. maintain potassium level above 4   Myocardial injury trop 21 --> 45. No CP check A1c and FLP  ASA and Lipitor    Essential Hypertension IV hydralazine as needed held amlodipine and Micardis since patient was at high risk of developing hypotension due to severe sepsis but now BP up - restarting    CKD (chronic kidney disease) stage 4, GFR 15-29 ml/min:  Renal function close to baseline creatinine 1.5-1.6.   On admission, creatinine is 1.47, BUN 24, GFR 36 monitor BMP --> stable    Hypothyroidism Continue Synthroid   Hypercholesterolemia Aortic atherosclerosis per CT Lipitor    DVT prophylaxis: lovenox  Pertinent IV fluids/nutrition: NS 75 mL/h was d/c today, heart healthy diet  Central lines / invasive devices: none  Code Status:  FULL CODE   Current Admission Status: inpatient   TOC needs / Dispo plan: TBD Barriers to discharge / significant pending items: pending culture susceptibilities, anticipate here 1-2 more days given bacteremia

## 2022-06-19 NOTE — Consult Note (Signed)
Cardiology Consultation   Patient ID: MARAJADE Lin MRN: [id number]; DOB: September 23, 1940  Admit date: 06/18/2022 Date of Consult: 06/19/2022  PCP:  Dale West Easton, MD   [facility] Providers Cardiologist:  Debbe Odea, MD   {   Patient Profile:   Caitlin Lin is a 82 y.o. female with a hx of CAD, hypertension, hyperlipidemia, hypothyroidism, CKD stage IV, SDH, aortic regurgitation who is being seen 06/19/2022 for the evaluation of A-fib RVR at the request of Dr. Lyn Hollingshead.  History of Present Illness:   Ms. Caitlin Lin is followed by Dr. Azucena Cecil for the above cardiac issues.  Echo from October 2020 showed EF of 60 to 65%.  Chest CT in 2021 showed coronary artery calcifications.  Myoview in January 2022 was low risk with no evidence of ischemia.  Patient was last seen August 2023 for yearly follow-up and was overall stable from a cardiac perspective.  Patient presented to the ER 06/18/22 for flank pain with increased urinary frequency.  Patient reported bilateral lower back pain and flank pain for more 2 days.  Reported increased urinary frequency and pressure in the lower abdominal area.  No dysuria no hematuria.  She reported low-grade fever and chills. She denied chest pain, shortness of breath, palpitations, lower leg edema, orthopnea or pnd.  In the ER patient was incidentally found to have new onset atrial fibrillation with a heart rate of 186.  Patient was given 10 mg of IV Cardizem with conversion to normal sinus rhythm.  In the ER blood pressure 140/109, pulse 184, respiratory rate 27, 99.76F, 94% O2.  Labs showed CO2 19, serum creatinine 1.47, BUN 24, alk phos 129, total bili 1.3, lactic acid 5.4>2.2, WBC 2.6.  High-sensitivity troponin 21>45.  UA was positive for infection.  Chest x-ray was unremarkable.  EKG showed A-fib RVR with a heart rate of about 192 bpm.  Patient was started on IV antibiotics and admitted for further workup.  Past Medical History:   Diagnosis Date   Cataracts, bilateral    Chronic kidney disease    Followed by Dr. Cherylann Ratel   Chronic kidney disease    Colon polyp    Constipation due to slow transit    Diarrhea    Diarrhea    GERD (gastroesophageal reflux disease)    Heart murmur    History of cyst of breast    History of hiatal hernia    Hyperlipidemia    Hypertension    Hypothyroidism    Melanoma of lower leg (HCC) 2013   Shingles 2013   Shingles    Spinal stenosis in cervical region    Dr. Yves Dill   Subdural hematoma [facility]    Thyroid disease     Past Surgical History:  Procedure Laterality Date   ABDOMINAL HYSTERECTOMY  1974   ANTERIOR AND POSTERIOR VAGINAL REPAIR     ANTERIOR AND POSTERIOR VAGINAL REPAIR W/ SACROSPINOUS LIGAMENT SUSPENSION Right    BREAST BIOPSY  1963   Benign per pt's report   BREAST EXCISIONAL BIOPSY Right 1963   neg   COLONOSCOPY     COLONOSCOPY N/A 10/29/2021   Procedure: COLONOSCOPY;  Surgeon: Toledo, Boykin Nearing, MD;  Location: [facility] ENDOSCOPY;  Service: Gastroenterology;  Laterality: N/A;   ELECTROMAGNETIC NAVIGATION BROCHOSCOPY Right 01/09/2019   Procedure: ELECTROMAGNETIC NAVIGATION BRONCHOSCOPY;  Surgeon: Salena Saner, MD;  Location: [facility] ORS;  Service: Cardiopulmonary;  Laterality: Right;   ESOPHAGOGASTRODUODENOSCOPY     ESOPHAGOGASTRODUODENOSCOPY (EGD) WITH PROPOFOL N/A 11/10/2016   Procedure: ESOPHAGOGASTRODUODENOSCOPY (  EGD) WITH PROPOFOL;  Surgeon: Christena Deem, MD;  Location: [facility] ENDOSCOPY;  Service: Endoscopy;  Laterality: N/A;   ESOPHAGOGASTRODUODENOSCOPY (EGD) WITH PROPOFOL N/A 09/09/2018   Procedure: ESOPHAGOGASTRODUODENOSCOPY (EGD) WITH PROPOFOL;  Surgeon: Christena Deem, MD;  Location: [facility] ENDOSCOPY;  Service: Endoscopy;  Laterality: N/A;   ESOPHAGOGASTRODUODENOSCOPY (EGD) WITH PROPOFOL N/A 10/27/2019   Procedure: ESOPHAGOGASTRODUODENOSCOPY (EGD) WITH PROPOFOL;  Surgeon: Earline Mayotte, MD;  Location: [facility] ENDOSCOPY;  Service: Endoscopy;   Laterality: N/A;   ESOPHAGOGASTRODUODENOSCOPY (EGD) WITH PROPOFOL N/A 08/09/2020   Procedure: ESOPHAGOGASTRODUODENOSCOPY (EGD) WITH PROPOFOL;  Surgeon: Regis Bill, MD;  Location: [facility] ENDOSCOPY;  Service: Endoscopy;  Laterality: N/A;   LUNG BIOPSY  2007   Benign per pt's report   LUNG BIOPSY Right    OOPHORECTOMY  1970   PERINEOPLASTY  12/17/2013   sling for stress incontinence  12/18/2011     Home Medications:  Prior to Admission medications   Medication Sig Start Date End Date Taking? Authorizing Provider  Alpha-Lipoic Acid 200 MG TABS Take 200 mg by mouth 3 (three) times daily.   Yes [provider]  amLODipine (NORVASC) 5 MG tablet Take 1 tablet (5 mg total) by mouth daily. 03/26/22  Yes Dale New Ringgold, MD  aspirin 81 MG tablet Take 1 tablet (81 mg total) by mouth daily. 10/15/15  Yes Shelia Media, MD  atorvastatin (LIPITOR) 20 MG tablet Take 1 tablet (20 mg total) by mouth daily. 03/26/22  Yes Dale Los Arcos, MD  Cholecalciferol 25 MCG (1000 UT) tablet Take 1,000 Units by mouth daily.   Yes [provider]  Docusate Calcium (STOOL SOFTENER PO) Take by mouth.   Yes [provider]  fluticasone Aleda Grana) 50 MCG/ACT nasal spray  10/06/21  Yes [provider]  Glucosamine-Chondroit-Vit C-Mn (GLUCOSAMINE CHONDR 500 COMPLEX PO) Take 2 tablets by mouth daily.   Yes [provider]  levothyroxine (SYNTHROID) 75 MCG tablet TAKE 1 TABLET BY MOUTH ONCE DAILY IN THE MORNING BEFORE BREAKFAST 03/26/22  Yes Dale Dudley, MD  Multiple Vitamin (MULTIVITAMIN) tablet Take 1 tablet by mouth daily.   Yes [provider]  Omega-3 Fatty Acids (FISH OIL) 1000 MG CAPS Take 1,000 mg by mouth daily.   Yes [provider]  pantoprazole (PROTONIX) 40 MG tablet Take 1 tablet (40 mg total) by mouth daily. Take 30 minutes before breakfast 03/26/22  Yes Dale Jacksonport, MD  Polyethylene Glycol 3350 (MIRALAX PO) Take by mouth as needed.   Yes  [provider]  Probiotic Product (PROBIOTIC DAILY PO) Take by mouth daily.   Yes [provider]  telmisartan (MICARDIS) 20 MG tablet Take 1 tablet by mouth daily. 06/08/22 06/08/23 Yes [provider]  Wheat Dextrin (BENEFIBER PO) Take by mouth.   Yes [provider]  losartan (COZAAR) 25 MG tablet Take 1 tablet (25 mg total) by mouth daily. Patient not taking: Reported on 06/18/2022 03/26/22   Dale , MD    Inpatient Medications: Scheduled Meds:  aspirin EC  81 mg Oral Daily   atorvastatin  20 mg Oral Daily   cholecalciferol  1,000 Units Oral Daily   enoxaparin (LOVENOX) injection  30 mg Subcutaneous Q24H   levothyroxine  75 mcg Oral Q0600   multivitamin with minerals  1 tablet Oral Daily   omega-3 acid ethyl esters  1 g Oral Daily   pantoprazole  40 mg Oral Daily   Continuous Infusions:  sodium chloride 75 mL/hr at 06/19/22 0121   cefTRIAXone (ROCEPHIN)  IV  PRN Meds: acetaminophen, hydrALAZINE, metoprolol tartrate, ondansetron (ZOFRAN) IV  Allergies:    Allergies  Allergen Reactions   Quinapril Other (See Comments)    Hyperkalemia   Codeine Rash    Social History:   Social History   Socioeconomic History   Marital status: Married    Spouse name: Not on file   Number of children: 2   Years of education: Not on file   Highest education level: Not on file  Occupational History   Occupation: Retired  Tobacco Use   Smoking status: Never   Smokeless tobacco: Never  Vaping Use   Vaping Use: Never used  Substance and Sexual Activity   Alcohol use: No   Drug use: No   Sexual activity: Yes  Other Topics Concern   Not on file  Social History Narrative   Lives in [place] with husband. Has 2 children boy and girl, 4 grandchildren. Retired Insurance underwriter.       Daily Caffeine Use:  NO   Regular Exercise -  Not at this time due to back pain, would like to resume soon   Social Determinants of Health   Financial Resource  Strain: Low Risk  (11/26/2021)   Overall Financial Resource Strain (CARDIA)    Difficulty of Paying Living Expenses: Not hard at all  Food Insecurity: No Food Insecurity (11/26/2021)   Hunger Vital Sign    Worried About Running Out of Food in the Last Year: Never true    Ran Out of Food in the Last Year: Never true  Transportation Needs: No Transportation Needs (11/25/2020)   PRAPARE - Administrator, Civil Service (Medical): No    Lack of Transportation (Non-Medical): No  Physical Activity: Insufficiently Active (11/26/2021)   Exercise Vital Sign    Days of Exercise per Week: 4 days    Minutes of Exercise per Session: 20 min  Stress: No Stress Concern Present (11/26/2021)   [employer] of Occupational Health - Occupational Stress Questionnaire    Feeling of Stress : Not at all  Social Connections: Unknown (11/26/2021)   Social Connection and Isolation Panel [NHANES]    Frequency of Communication with Friends and Family: Not on file    Frequency of Social Gatherings with Friends and Family: Not on file    Attends Religious Services: Not on file    Active Member of Clubs or Organizations: Not on file    Attends Banker Meetings: Not on file    Marital Status: Married  Intimate Partner Violence: Not At Risk (11/26/2021)   Humiliation, Afraid, Rape, and Kick questionnaire    Fear of Current or Ex-Partner: No    Emotionally Abused: No    Physically Abused: No    Sexually Abused: No    Family History:    Family History  Problem Relation Age of Onset   Arthritis Mother    Liver disease Mother    Cirrhosis Mother    Liver cancer Mother    Heart disease Father    Heart attack Father    Coronary artery disease Father    Depression Brother    Prostate cancer Brother    Diabetes Maternal Aunt    Breast cancer Cousin        maternal cousins   Liver cancer Cousin    Breast cancer Other      ROS:  Please see the history of present illness.   All other  ROS reviewed and negative.     Physical  Exam/Data:   Vitals:   06/19/22 0123 06/19/22 0130 06/19/22 0430 06/19/22 0506  BP:  (!) 115/53 (!) 122/55   Pulse:  69 67   Resp:  (!) 25 (!) 21   Temp: 98.3 F (36.8 C)   98.4 F (36.9 C)  TempSrc: Oral     SpO2:  96% 96%   Weight:        Intake/Output Summary (Last 24 hours) at 06/19/2022 0737 Last data filed at 06/18/2022 2255 Gross per 24 hour  Intake 3047.9 ml  Output --  Net 3047.9 ml      06/18/2022    1:31 PM 04/14/2022    9:01 AM 03/26/2022    4:19 PM  Last 3 Weights  Weight (lbs) 150 lb 150 lb 152 lb 9.6 oz  Weight (kg) 68.04 kg 68.04 kg 69.219 kg     Body mass index is 25.75 kg/m.  General:  Well nourished, well developed, in no acute distress HEENT: normal Neck: no JVD Vascular: No carotid bruits; Distal pulses 2+ bilaterally Cardiac:  normal S1, S2; RRR; no murmur  Lungs:  clear to auscultation bilaterally, no wheezing, rhonchi or rales  Abd: soft, nontender, no hepatomegaly  Ext: no edema Musculoskeletal:  No deformities, BUE and BLE strength normal and equal Skin: warm and dry  Neuro:  CNs 2-12 intact, no focal abnormalities noted Psych:  Normal affect   EKG:  The EKG was personally reviewed and demonstrates:  Afib, 192bpm, rate related changes Telemetry:  Telemetry was personally reviewed and demonstrates:  NSR HR 90s  Relevant CV Studies:  Myoview lexiscan 03/2020 Narrative & Impression  Pharmacological myocardial perfusion imaging study with no significant  Ischemia Small region fixed defect apical, apical lateral wall, possible attenuation artifact Normal wall motion, EF estimated at 92% No EKG changes concerning for ischemia at peak stress or in recovery.  Rare PVC noted CT attenuation correction images with mild diffuse aortic atherosclerosis Low risk scan     Echo 12/2018 1. Left ventricular ejection fraction, by visual estimation, is 60 to  65%. The left ventricle has normal function. Normal left  ventricular size.  There is no left ventricular hypertrophy.   2. Global right ventricle has normal systolic function.The right  ventricular size is normal. No increase in right ventricular wall  thickness.   3. Left atrial size was normal.   4. There is mild dilatation of the ascending aorta measuring 37 mm.   5. Mildly elevated pulmonary artery systolic pressure.   6. Left ventricular diastolic Doppler parameters are consistent with  impaired relaxation pattern of LV diastolic filling.   Laboratory Data:  High Sensitivity Troponin:   Recent Labs  Lab 06/18/22 1348 06/18/22 1542  TROPONINIHS 21* 45*     Chemistry Recent Labs  Lab 06/18/22 1348 06/19/22 0351  NA 137 141  K 3.8 4.4  CL 104 117*  CO2 19* 17*  GLUCOSE 111* 99  BUN 24* 25*  CREATININE 1.47* 1.46*  CALCIUM 9.2 8.1*  MG 1.7  --   GFRNONAA 36* 36*  ANIONGAP 14 7    Recent Labs  Lab 06/18/22 1348  PROT 7.5  ALBUMIN 3.8  AST 37  ALT 20  ALKPHOS 129*  BILITOT 1.3*   Lipids No results for input(s): "CHOL", "TRIG", "HDL", "LABVLDL", "LDLCALC", "CHOLHDL" in the last 168 hours.  Hematology Recent Labs  Lab 06/18/22 1348 06/19/22 0351  WBC 2.6* 16.7*  RBC 4.52 3.43*  HGB 13.6 10.4*  HCT 42.0 31.4*  MCV  92.9 91.5  MCH 30.1 30.3  MCHC 32.4 33.1  RDW 12.9 13.4  PLT 161 123*   Thyroid  Recent Labs  Lab 06/18/22 1348  TSH 1.017  FREET4 0.96    BNPNo results for input(s): "BNP", "PROBNP" in the last 168 hours.  DDimer No results for input(s): "DDIMER" in the last 168 hours.   Radiology/Studies:  CT CHEST ABDOMEN PELVIS WO CONTRAST  Result Date: 06/18/2022 CLINICAL DATA:  Sepsis.  Lower abdominal and back pain. EXAM: CT CHEST, ABDOMEN AND PELVIS WITHOUT CONTRAST TECHNIQUE: Multidetector CT imaging of the chest, abdomen and pelvis was performed following the standard protocol without IV contrast. RADIATION DOSE REDUCTION: This exam was performed according to the departmental dose-optimization  program which includes automated exposure control, adjustment of the mA and/or kV according to patient size and/or use of iterative reconstruction technique. COMPARISON:  04/23/2022 FINDINGS: CT CHEST FINDINGS Cardiovascular: Heart is normal size. Aorta is normal caliber. Moderate coronary artery and aortic calcifications. Mediastinum/Nodes: No mediastinal, hilar, or axillary adenopathy. Trachea and esophagus are unremarkable. Thyroid unremarkable. Lungs/Pleura: Small calcified granuloma posteriorly in the right upper lobe as well as in the lingula. Linear areas of scarring in the lungs bilaterally. No confluent airspace opacities or effusions. Musculoskeletal: Chest wall soft tissues are unremarkable. No acute bony abnormality. CT ABDOMEN PELVIS FINDINGS Hepatobiliary: No focal hepatic abnormality. Gallbladder unremarkable. Pancreas: No focal abnormality or ductal dilatation. Spleen: No focal abnormality.  Normal size. Adrenals/Urinary Tract: Adrenal glands normal. Bilateral renal extrarenal pelves. No frank hydronephrosis. No stones or mass. Urinary bladder unremarkable. Small amount of gas in the bladder lumen, presumably from recent catheterization. Stomach/Bowel: Sigmoid diverticulosis. No active diverticulitis. Stomach and small bowel decompressed, unremarkable. Vascular/Lymphatic: Aortic atherosclerosis. No evidence of aneurysm or adenopathy. Reproductive: Prior hysterectomy.  No adnexal masses. Other: No free fluid or free air. Musculoskeletal: Lucent lesion within the T12 vertebral body, stable since 2021 most compatible with a benign process. Diffuse degenerative changes and scoliosis. No acute bony abnormality. IMPRESSION: Coronary artery disease, aortic atherosclerosis. Evidence of old granulomatous disease and scarring in the lungs. Sigmoid diverticulosis.  No active diverticulitis. No acute findings in the chest, abdomen or pelvis. Electronically Signed   By: Charlett Nose M.D.   On: 06/18/2022 15:13    DG Chest Port 1 View  Result Date: 06/18/2022 CLINICAL DATA:  Fever, headache, elevated heart rate. EXAM: PORTABLE CHEST 1 VIEW COMPARISON:  Chest radiograph 11/27/2020 FINDINGS: The heart is enlarged, unchanged. The upper mediastinal contours are normal. There is no focal consolidation or pulmonary edema. There is no pleural effusion or pneumothorax There is no acute osseous abnormality. IMPRESSION: No radiographic evidence of acute cardiopulmonary process. Electronically Signed   By: Lesia Hausen M.D.   On: 06/18/2022 14:41     Assessment and Plan:   Afib with RVR -Patient presented with severe sepsis due to pyelonephritis incidentally found to have A-fib RVR with rates up to 190s given IV Dilt with conversion to normal sinus rhythm -no significant symptoms reported in afib.  - Patient remains in normal sinus rhythm - CHADSVASC at least 3 (HTN, age x2, female). She has a history of SDH (may years ago from a fall). Given afib in the setting of acute infection and SDH will discuss initiation of blood thinner with MD - start rate controlling medication, Lopressor 12.5mg  BID - keep Mag>2 and K>4 - TSH wnl - check an echo - will likely need heart monitor at some point to check for reoccurrence  Elevated troponin Coronary  artery calcifications on CT -Troponin elevated 21>45, continue to trend troponin -History of coronary artery calcifications on CT in 2021 - normal Myoview lexi in 2022 with no ischemia -No chest pain reported - Suspect supply demand mismatch in the setting of severe sepsis and A-fib RVR -Can pursue further ischemic workup in the outpatient setting  Severe sepsis 2/2 pyelonephritis - Lactic acid normalized  - antibiotics per IM  HTN -Blood pressure stable - PTA amlodipine and ?losartan vs telmisartan held - start Lopressor 12.5mg  BID  CKD stage 4 -Kidney function at baseline  Hypothyroidism -TSH normal  Hypercholesterolemia - PTA lipitor 20mg  daily.   For  questions or updates, please contact [facility] Please consult [email or web address] for contact info under    Signed, Ruffin Lada David Stall, PA-C  06/19/2022 7:37 AM

## 2022-06-19 NOTE — Progress Notes (Signed)
PHARMACY - PHYSICIAN COMMUNICATION CRITICAL VALUE ALERT - BLOOD CULTURE IDENTIFICATION (BCID)  Results for orders placed or performed during the hospital encounter of 06/18/22  Culture, blood (Routine x 2)     Status: None (Preliminary result)   Collection Time: 06/18/22  1:48 PM   Specimen: BLOOD  Result Value Ref Range Status   Specimen Description BLOOD LEFT AC  Final   Special Requests   Final    BOTTLES DRAWN AEROBIC AND ANAEROBIC Blood Culture results may not be optimal due to an excessive volume of blood received in culture bottles   Culture  Setup Time   Final    GRAM NEGATIVE RODS ANAEROBIC BOTTLE ONLY Organism ID to follow CRITICAL RESULT CALLED TO, READ BACK BY AND VERIFIED WITH: Caitlin Lin [id number]  06/19/22 RH Performed at [facility], [address] [place], [place] [address]    Culture GRAM NEGATIVE RODS  Final   Report Status PENDING  Incomplete  Culture, blood (Routine x 2)     Status: None (Preliminary result)   Collection Time: 06/18/22  1:48 PM   Specimen: BLOOD  Result Value Ref Range Status   Specimen Description BLOOD RIGHT AC  Final   Special Requests   Final    BOTTLES DRAWN AEROBIC AND ANAEROBIC Blood Culture adequate volume   Culture  Setup Time   Final    GRAM NEGATIVE RODS AEROBIC BOTTLE ONLY CRITICAL VALUE NOTED.  VALUE IS CONSISTENT WITH PREVIOUSLY REPORTED AND CALLED VALUE. Performed at [facility], [address] [place], [place] [address]    Culture GRAM NEGATIVE RODS  Final   Report Status PENDING  Incomplete  Blood Culture ID Panel (Reflexed)     Status: Abnormal   Collection Time: 06/18/22  1:48 PM  Result Value Ref Range Status   Enterococcus faecalis NOT DETECTED NOT DETECTED Final   Enterococcus Faecium NOT DETECTED NOT DETECTED Final   Listeria monocytogenes NOT DETECTED NOT DETECTED Final   Staphylococcus species NOT DETECTED NOT DETECTED Final   Staphylococcus aureus (BCID) NOT DETECTED NOT DETECTED Final    Staphylococcus epidermidis NOT DETECTED NOT DETECTED Final   Staphylococcus lugdunensis NOT DETECTED NOT DETECTED Final   Streptococcus species NOT DETECTED NOT DETECTED Final   Streptococcus agalactiae NOT DETECTED NOT DETECTED Final   Streptococcus pneumoniae NOT DETECTED NOT DETECTED Final   Streptococcus pyogenes NOT DETECTED NOT DETECTED Final   A.calcoaceticus-baumannii NOT DETECTED NOT DETECTED Final   Bacteroides fragilis NOT DETECTED NOT DETECTED Final   Enterobacterales DETECTED (A) NOT DETECTED Final    Comment: Enterobacterales represent a large order of gram negative bacteria, not a single organism. CRITICAL RESULT CALLED TO, READ BACK BY AND VERIFIED WITH: Caitlin Lin BLUE@[id number]  06/19/22 RH    Enterobacter cloacae complex NOT DETECTED NOT DETECTED Final   Escherichia coli DETECTED (A) NOT DETECTED Final    Comment: CRITICAL RESULT CALLED TO, READ BACK BY AND VERIFIED WITH: Caitlin Lin BLUE@0314  06/19/22 RH    Klebsiella aerogenes NOT DETECTED NOT DETECTED Final   Klebsiella oxytoca NOT DETECTED NOT DETECTED Final   Klebsiella pneumoniae NOT DETECTED NOT DETECTED Final   Proteus species NOT DETECTED NOT DETECTED Final   Salmonella species NOT DETECTED NOT DETECTED Final   Serratia marcescens NOT DETECTED NOT DETECTED Final   Haemophilus influenzae NOT DETECTED NOT DETECTED Final   Neisseria meningitidis NOT DETECTED NOT DETECTED Final   Pseudomonas aeruginosa NOT DETECTED NOT DETECTED Final   Stenotrophomonas maltophilia NOT DETECTED NOT DETECTED Final   Candida albicans NOT DETECTED  NOT DETECTED Final   Candida auris NOT DETECTED NOT DETECTED Final   Candida glabrata NOT DETECTED NOT DETECTED Final   Candida krusei NOT DETECTED NOT DETECTED Final   Candida parapsilosis NOT DETECTED NOT DETECTED Final   Candida tropicalis NOT DETECTED NOT DETECTED Final   Cryptococcus neoformans/gattii NOT DETECTED NOT DETECTED Final   CTX-M ESBL NOT DETECTED NOT DETECTED Final   Carbapenem  resistance IMP NOT DETECTED NOT DETECTED Final   Carbapenem resistance KPC NOT DETECTED NOT DETECTED Final   Carbapenem resistance NDM NOT DETECTED NOT DETECTED Final   Carbapenem resist OXA 48 LIKE NOT DETECTED NOT DETECTED Final   Carbapenem resistance VIM NOT DETECTED NOT DETECTED Final    Comment: Performed at [facility], [address] [place], [place] [address]    BCID Results: 2 (1 anaerobic, 1 aerobic, different sets) of 4 bottles with E. Coli, no resistance. Pt currently on Ceftriaxone 2 gm q24h.  Name of provider contacted: Cliffton Asters, NP   Changes to prescribed antibiotics required: No change needed at this time.  Otelia Sergeant, PharmD, [facility] 06/19/2022 3:37 AM

## 2022-06-19 NOTE — Progress Notes (Signed)
PROGRESS NOTE    Caitlin Lin   UJW:[id number] DOB: 1941-02-21  DOA: 06/18/2022 Date of Service: 06/19/22 PCP: Dale [place], MD     Brief Narrative / Hospital Course:  Caitlin Lin is a 82 y.o. female with medical history significant of CAD, HTN, HLD, hypothyroidism, CKD-4, SDH, aortic regurgitation, who presents with flank pain and increased urinary frequency x2+ days.  04/04: new onset atrial fibrillation with heart rate up to 186, which is converted to sinus rhythm after giving 10 mg of IV Cardizem and 60 mg of oral Cardizem in ED. --> heart rate 90-100s. WBC 2.6, lactic acid 5.4 --> 2.2, positive urinalysis (cloudy appearance, large amount of leukocyte, negative bacteria, WBC> 50), troponin 21 --> 45,  renal function close to baseline, TSH 1.017, temperature 99.7, blood pressure 116/59, RR 35, oxygen saturation 99% on room air.  Chest x-ray negative.  Patient is admitted to PCU as inpatient.  Dr. Kirke Corin of cardiology is consulted.  04/05: remains tachypneic, BP soft but not severely hypotensive. WBC up to 16.7 = still meets sepsis criteria. (+)BCx EColi, pending susceptibilities. Remain on IV abx through today.   Consultants:  Cardiology - [facility]  Procedures: none  Antibiotics:  04/04 in ED - cefepime, metronidazole, vancomycin x1 04/05: ceftriaxone 2 g IV q24h    ASSESSMENT & PLAN:   Principal Problem:   Severe sepsis Active Problems:   Atrial fibrillation with RVR   Myocardial injury   Acute pyelonephritis   Hypertension   CKD (chronic kidney disease) stage 4, GFR 15-29 ml/min   Hypothyroidism   Hypercholesterolemia   Aortic atherosclerosis   Coronary artery disease by CT  Severe sepsis due to acute pyelonephritis:  pt has severe sepsis on admission WBC 2.6, tachycardia, tachypnea with RR up to 35.  Lactic acid 5.4 --> 2.2. IV Rocephin (patient received 1 dose of vancomycin, Flagyl and cefepime in ED) Follow-up blood culture -->  Follow-up urine culture -->   Trend lactic acid level --> now WNL check procalcitonin level IV fluid: 2.5 L normal saline, then 75 cc/h  Atrial fibrillation with RVR: likely triggered by severe sepsis.   CHA2DS2-VASc Score is 5.  Since pt is already converted to sinus rhythm now, did not start anticoagulants now.   Cardiology following  Zio monitor on discharge  No anticoagulation needed unless Afib returns while adequately treated for sepsis  As needed metoprolol 5 mg every 2 hour for heart rate> 125 Check magnesium level --> 1.7. Will give 1g of magnesium sulfate to maintain magnesium level above 2. K is 3.8 --> give 40 mEq of Kdur to maintain potassium level above 4   Myocardial injury trop 21 --> 45. No CP trend trop check A1c and FLP  ASA and Lipitor    Essential Hypertension IV hydralazine as needed Hold amlodipine and Micardis since patient is at high risk of developing hypotension due to severe sepsis   CKD (chronic kidney disease) stage 4, GFR 15-29 ml/min:  Renal function close to baseline creatinine 1.5-1.6.   On admission, creatinine is 1.47, BUN 24, GFR 36 Follow-up BMP --> stable    Hypothyroidism Continue Synthroid   Hypercholesterolemia Aortic atherosclerosis per CT Lipitor    DVT prophylaxis: lovenox  Pertinent IV fluids/nutrition: NS 75 mL/h, heart healthy diet  Central lines / invasive devices: none  Code Status:  FULL CODE   Current Admission Status: inpatient   TOC needs / Dispo plan: TBD Barriers to discharge / significant pending items: pending cardiology eval, culture  results, anticipate here 1-2 more days              Subjective / Brief ROS:  Patient reports improvement today but still feeling tired Denies CP/SOB.  Pain controlled.  Denies new weakness.  Tolerating diet.  Reports no concerns w/ urination/defecation.   Family Communication: husband at bedside on rounds     Objective Findings:  Vitals:   06/19/22 1300 06/19/22 1330 06/19/22 1400  06/19/22 1424  BP: (!) 148/72 (!) 118/54 (!) 102/50   Pulse: 72 63 68   Resp: (!) 26 (!) 21 (!) 22   Temp:    99 F (37.2 C)  TempSrc:      SpO2: 100% 96% 98%   Weight:        Intake/Output Summary (Last 24 hours) at 06/19/2022 1529 Last data filed at 06/18/2022 2255 Gross per 24 hour  Intake 1947.9 ml  Output --  Net 1947.9 ml   Filed Weights   06/18/22 1331  Weight: 68 kg    Examination:  Physical Exam Constitutional:      General: She is not in acute distress. Cardiovascular:     Rate and Rhythm: Normal rate and regular rhythm.  Pulmonary:     Effort: Pulmonary effort is normal.     Breath sounds: Normal breath sounds.  Abdominal:     General: Abdomen is flat. Bowel sounds are normal.     Palpations: Abdomen is soft.  Skin:    General: Skin is warm and dry.  Neurological:     General: No focal deficit present.     Mental Status: She is alert.  Psychiatric:        Mood and Affect: Mood normal.        Behavior: Behavior normal.          Scheduled Medications:   aspirin EC  81 mg Oral Daily   atorvastatin  20 mg Oral Daily   cholecalciferol  1,000 Units Oral Daily   enoxaparin (LOVENOX) injection  30 mg Subcutaneous Q24H   levothyroxine  75 mcg Oral Q0600   metoprolol tartrate  12.5 mg Oral BID   multivitamin with minerals  1 tablet Oral Daily   omega-3 acid ethyl esters  1 g Oral Daily   pantoprazole  40 mg Oral Daily    Continuous Infusions:  sodium chloride 75 mL/hr at 06/19/22 0121   cefTRIAXone (ROCEPHIN)  IV Stopped (06/19/22 1045)    PRN Medications:  acetaminophen, hydrALAZINE, metoprolol tartrate, ondansetron (ZOFRAN) IV  Antimicrobials from admission:  Anti-infectives (From admission, onward)    Start     Dose/Rate Route Frequency Ordered Stop   06/19/22 1000  cefTRIAXone (ROCEPHIN) 1 g in sodium chloride 0.9 % 100 mL IVPB  Status:  Discontinued        1 g 200 mL/hr over 30 Minutes Intravenous Every 24 hours 06/18/22 1744 06/19/22  0343   06/19/22 1000  cefTRIAXone (ROCEPHIN) 2 g in sodium chloride 0.9 % 100 mL IVPB        2 g 200 mL/hr over 30 Minutes Intravenous Every 24 hours 06/19/22 0343     06/18/22 1500  vancomycin (VANCOREADY) IVPB 1250 mg/250 mL        1,250 mg 166.7 mL/hr over 90 Minutes Intravenous  Once 06/18/22 1446 06/18/22 1800   06/18/22 1445  ceFEPIme (MAXIPIME) 2 g in sodium chloride 0.9 % 100 mL IVPB        2 g 200 mL/hr over 30 Minutes Intravenous  Once 06/18/22 1436 06/18/22 1513   06/18/22 1445  metroNIDAZOLE (FLAGYL) IVPB 500 mg        500 mg 100 mL/hr over 60 Minutes Intravenous  Once 06/18/22 1436 06/18/22 1609           Data Reviewed:  I have personally reviewed the following...  CBC: Recent Labs  Lab 06/18/22 1348 06/19/22 0351  WBC 2.6* 16.7*  NEUTROABS 2.1  --   HGB 13.6 10.4*  HCT 42.0 31.4*  MCV 92.9 91.5  PLT 161 123*   Basic Metabolic Panel: Recent Labs  Lab 06/18/22 1348 06/19/22 0351  NA 137 141  K 3.8 4.4  CL 104 117*  CO2 19* 17*  GLUCOSE 111* 99  BUN 24* 25*  CREATININE 1.47* 1.46*  CALCIUM 9.2 8.1*  MG 1.7  --    GFR: Estimated Creatinine Clearance: 28.6 mL/min (A) (by C-G formula based on SCr of 1.46 mg/dL (H)). Liver Function Tests: Recent Labs  Lab 06/18/22 1348  AST 37  ALT 20  ALKPHOS 129*  BILITOT 1.3*  PROT 7.5  ALBUMIN 3.8   No results for input(s): "LIPASE", "AMYLASE" in the last 168 hours. No results for input(s): "AMMONIA" in the last 168 hours. Coagulation Profile: Recent Labs  Lab 06/18/22 1348  INR 1.1   Cardiac Enzymes: No results for input(s): "CKTOTAL", "CKMB", "CKMBINDEX", "TROPONINI" in the last 168 hours. BNP (last 3 results) No results for input(s): "PROBNP" in the last 8760 hours. HbA1C: No results for input(s): "HGBA1C" in the last 72 hours. CBG: No results for input(s): "GLUCAP" in the last 168 hours. Lipid Profile: No results for input(s): "CHOL", "HDL", "LDLCALC", "TRIG", "CHOLHDL", "LDLDIRECT" in  the last 72 hours. Thyroid Function Tests: Recent Labs    06/18/22 1348  TSH 1.017  FREET4 0.96   Anemia Panel: No results for input(s): "VITAMINB12", "FOLATE", "FERRITIN", "TIBC", "IRON", "RETICCTPCT" in the last 72 hours. Most Recent Urinalysis On File:     Component Value Date/Time   COLORURINE YELLOW (A) 06/18/2022 1355   COLORURINE YELLOW (A) 06/18/2022 1355   APPEARANCEUR CLOUDY (A) 06/18/2022 1355   APPEARANCEUR CLOUDY (A) 06/18/2022 1355   LABSPEC 1.006 06/18/2022 1355   LABSPEC 1.008 06/18/2022 1355   PHURINE 5.0 06/18/2022 1355   PHURINE 5.0 06/18/2022 1355   GLUCOSEU NEGATIVE 06/18/2022 1355   GLUCOSEU NEGATIVE 06/18/2022 1355   GLUCOSEU NEGATIVE 07/22/2018 0839   HGBUR MODERATE (A) 06/18/2022 1355   HGBUR MODERATE (A) 06/18/2022 1355   BILIRUBINUR NEGATIVE 06/18/2022 1355   BILIRUBINUR NEGATIVE 06/18/2022 1355   BILIRUBINUR neg 03/07/2012 1043   KETONESUR NEGATIVE 06/18/2022 1355   KETONESUR NEGATIVE 06/18/2022 1355   PROTEINUR 30 (A) 06/18/2022 1355   PROTEINUR 30 (A) 06/18/2022 1355   UROBILINOGEN 0.2 07/22/2018 0839   NITRITE NEGATIVE 06/18/2022 1355   NITRITE NEGATIVE 06/18/2022 1355   LEUKOCYTESUR LARGE (A) 06/18/2022 1355   LEUKOCYTESUR LARGE (A) 06/18/2022 1355   Sepsis Labs: @LABRCNTIP (procalcitonin:4,lacticidven:4) Microbiology: Recent Results (from the past 240 hour(s))  Culture, blood (Routine x 2)     Status: None (Preliminary result)   Collection Time: 06/18/22  1:48 PM   Specimen: BLOOD  Result Value Ref Range Status   Specimen Description   Final    BLOOD LEFT AC Performed at [facility], [address] [place], [place] [id number]    Special Requests   Final    BOTTLES DRAWN AEROBIC AND ANAEROBIC Blood Culture results may not be optimal due to an excessive volume of blood  received in culture bottles Performed at [facility], [address] [place], [place] [address]    Culture  Setup Time   Final    GRAM  NEGATIVE RODS IN BOTH AEROBIC AND ANAEROBIC BOTTLES CRITICAL RESULT CALLED TO, READ BACK BY AND VERIFIED WITH: NATHAN BLUE@[id number]  06/19/22 RH Performed at [facility], [address] [place], [place] [address]    Culture GRAM NEGATIVE RODS  Final   Report Status PENDING  Incomplete  Culture, blood (Routine x 2)     Status: None (Preliminary result)   Collection Time: 06/18/22  1:48 PM   Specimen: BLOOD  Result Value Ref Range Status   Specimen Description BLOOD RIGHT AC  Final   Special Requests   Final    BOTTLES DRAWN AEROBIC AND ANAEROBIC Blood Culture adequate volume   Culture  Setup Time   Final    GRAM NEGATIVE RODS AEROBIC BOTTLE ONLY CRITICAL VALUE NOTED.  VALUE IS CONSISTENT WITH PREVIOUSLY REPORTED AND CALLED VALUE. Performed at [facility], [address] [place], [place] [address]    Culture GRAM NEGATIVE RODS  Final   Report Status PENDING  Incomplete  Blood Culture ID Panel (Reflexed)     Status: Abnormal   Collection Time: 06/18/22  1:48 PM  Result Value Ref Range Status   Enterococcus faecalis NOT DETECTED NOT DETECTED Final   Enterococcus Faecium NOT DETECTED NOT DETECTED Final   Listeria monocytogenes NOT DETECTED NOT DETECTED Final   Staphylococcus species NOT DETECTED NOT DETECTED Final   Staphylococcus aureus (BCID) NOT DETECTED NOT DETECTED Final   Staphylococcus epidermidis NOT DETECTED NOT DETECTED Final   Staphylococcus lugdunensis NOT DETECTED NOT DETECTED Final   Streptococcus species NOT DETECTED NOT DETECTED Final   Streptococcus agalactiae NOT DETECTED NOT DETECTED Final   Streptococcus pneumoniae NOT DETECTED NOT DETECTED Final   Streptococcus pyogenes NOT DETECTED NOT DETECTED Final   A.calcoaceticus-baumannii NOT DETECTED NOT DETECTED Final   Bacteroides fragilis NOT DETECTED NOT DETECTED Final   Enterobacterales DETECTED (A) NOT DETECTED Final    Comment: Enterobacterales represent a large order of gram negative bacteria, not a  single organism. CRITICAL RESULT CALLED TO, READ BACK BY AND VERIFIED WITH: NATHAN BLUE@0314  06/19/22 RH    Enterobacter cloacae complex NOT DETECTED NOT DETECTED Final   Escherichia coli DETECTED (A) NOT DETECTED Final    Comment: CRITICAL RESULT CALLED TO, READ BACK BY AND VERIFIED WITH: NATHAN BLUE@0314  06/19/22 RH    Klebsiella aerogenes NOT DETECTED NOT DETECTED Final   Klebsiella oxytoca NOT DETECTED NOT DETECTED Final   Klebsiella pneumoniae NOT DETECTED NOT DETECTED Final   Proteus species NOT DETECTED NOT DETECTED Final   Salmonella species NOT DETECTED NOT DETECTED Final   Serratia marcescens NOT DETECTED NOT DETECTED Final   Haemophilus influenzae NOT DETECTED NOT DETECTED Final   Neisseria meningitidis NOT DETECTED NOT DETECTED Final   Pseudomonas aeruginosa NOT DETECTED NOT DETECTED Final   Stenotrophomonas maltophilia NOT DETECTED NOT DETECTED Final   Candida albicans NOT DETECTED NOT DETECTED Final   Candida auris NOT DETECTED NOT DETECTED Final   Candida glabrata NOT DETECTED NOT DETECTED Final   Candida krusei NOT DETECTED NOT DETECTED Final   Candida parapsilosis NOT DETECTED NOT DETECTED Final   Candida tropicalis NOT DETECTED NOT DETECTED Final   Cryptococcus neoformans/gattii NOT DETECTED NOT DETECTED Final   CTX-M ESBL NOT DETECTED NOT DETECTED Final   Carbapenem resistance IMP NOT DETECTED NOT DETECTED Final   Carbapenem resistance KPC  NOT DETECTED NOT DETECTED Final   Carbapenem resistance NDM NOT DETECTED NOT DETECTED Final   Carbapenem resist OXA 48 LIKE NOT DETECTED NOT DETECTED Final   Carbapenem resistance VIM NOT DETECTED NOT DETECTED Final    Comment: Performed at [facility], [address] [place], [place] [address]      Radiology Studies last 3 days: CT CHEST ABDOMEN PELVIS WO CONTRAST  Result Date: 06/18/2022 CLINICAL DATA:  Sepsis.  Lower abdominal and back pain. EXAM: CT CHEST, ABDOMEN AND PELVIS WITHOUT CONTRAST TECHNIQUE:  Multidetector CT imaging of the chest, abdomen and pelvis was performed following the standard protocol without IV contrast. RADIATION DOSE REDUCTION: This exam was performed according to the departmental dose-optimization program which includes automated exposure control, adjustment of the mA and/or kV according to patient size and/or use of iterative reconstruction technique. COMPARISON:  04/23/2022 FINDINGS: CT CHEST FINDINGS Cardiovascular: Heart is normal size. Aorta is normal caliber. Moderate coronary artery and aortic calcifications. Mediastinum/Nodes: No mediastinal, hilar, or axillary adenopathy. Trachea and esophagus are unremarkable. Thyroid unremarkable. Lungs/Pleura: Small calcified granuloma posteriorly in the right upper lobe as well as in the lingula. Linear areas of scarring in the lungs bilaterally. No confluent airspace opacities or effusions. Musculoskeletal: Chest wall soft tissues are unremarkable. No acute bony abnormality. CT ABDOMEN PELVIS FINDINGS Hepatobiliary: No focal hepatic abnormality. Gallbladder unremarkable. Pancreas: No focal abnormality or ductal dilatation. Spleen: No focal abnormality.  Normal size. Adrenals/Urinary Tract: Adrenal glands normal. Bilateral renal extrarenal pelves. No frank hydronephrosis. No stones or mass. Urinary bladder unremarkable. Small amount of gas in the bladder lumen, presumably from recent catheterization. Stomach/Bowel: Sigmoid diverticulosis. No active diverticulitis. Stomach and small bowel decompressed, unremarkable. Vascular/Lymphatic: Aortic atherosclerosis. No evidence of aneurysm or adenopathy. Reproductive: Prior hysterectomy.  No adnexal masses. Other: No free fluid or free air. Musculoskeletal: Lucent lesion within the T12 vertebral body, stable since 2021 most compatible with a benign process. Diffuse degenerative changes and scoliosis. No acute bony abnormality. IMPRESSION: Coronary artery disease, aortic atherosclerosis. Evidence of old  granulomatous disease and scarring in the lungs. Sigmoid diverticulosis.  No active diverticulitis. No acute findings in the chest, abdomen or pelvis. Electronically Signed   By: Charlett Nose M.D.   On: 06/18/2022 15:13   DG Chest Port 1 View  Result Date: 06/18/2022 CLINICAL DATA:  Fever, headache, elevated heart rate. EXAM: PORTABLE CHEST 1 VIEW COMPARISON:  Chest radiograph 11/27/2020 FINDINGS: The heart is enlarged, unchanged. The upper mediastinal contours are normal. There is no focal consolidation or pulmonary edema. There is no pleural effusion or pneumothorax There is no acute osseous abnormality. IMPRESSION: No radiographic evidence of acute cardiopulmonary process. Electronically Signed   By: Lesia Hausen M.D.   On: 06/18/2022 14:41             LOS: 1 day       Sunnie Nielsen, DO [facility] 06/19/2022, 3:29 PM    Dictation software may have been used to generate the above note. Typos may occur and escape review in typed/dictated notes. Please contact Dr Lyn Hollingshead directly for clarity if needed.  Staff may message me via secure chat in Epic  but this may not receive an immediate response,  please page me for urgent matters!  If 7PM-7AM, please contact night coverage [email or web address]

## 2022-06-19 NOTE — ED Notes (Signed)
Pt called out that her purewick had stopped working and her brief, gown, and chux were wet. This student changed pt's gown, brief, and chux and put purewick back on. Pt has no other needs at this time. Call bell is within reach. Pt stated she has a headache and would like tylenol. Pt's RN made aware.

## 2022-06-20 ENCOUNTER — Inpatient Hospital Stay
Admit: 2022-06-20 | Discharge: 2022-06-20 | Disposition: A | Discharge status: Home / Self Care | Payer: HMO | Attending: Cardiology | Admitting: Cardiology

## 2022-06-20 DIAGNOSIS — I10 Essential (primary) hypertension: Secondary | ICD-10-CM | POA: Diagnosis not present

## 2022-06-20 DIAGNOSIS — I480 Paroxysmal atrial fibrillation: Secondary | ICD-10-CM | POA: Diagnosis not present

## 2022-06-20 DIAGNOSIS — I2489 Other forms of acute ischemic heart disease: Secondary | ICD-10-CM

## 2022-06-20 LAB — BASIC METABOLIC PANEL
Anion gap: 7 (ref 5–15)
BUN: 34 mg/dL — ABNORMAL HIGH (ref 8–23)
CO2: 18 mmol/L — ABNORMAL LOW (ref 22–32)
Calcium: 8.4 mg/dL — ABNORMAL LOW (ref 8.9–10.3)
Chloride: 114 mmol/L — ABNORMAL HIGH (ref 98–111)
Creatinine, Ser: 1.43 mg/dL — ABNORMAL HIGH (ref 0.44–1.00)
GFR, Estimated: 37 mL/min — ABNORMAL LOW (ref >60)
Glucose, Bld: 81 mg/dL (ref 70–99)
Potassium: 4.3 mmol/L (ref 3.5–5.1)
Sodium: 139 mmol/L (ref 135–145)

## 2022-06-20 LAB — CBC
HCT: 31.9 % — ABNORMAL LOW (ref 36.0–46.0)
Hemoglobin: 10.4 g/dL — ABNORMAL LOW (ref 12.0–15.0)
MCH: 30.2 pg (ref 26.0–34.0)
MCHC: 32.6 g/dL (ref 30.0–36.0)
MCV: 92.7 fL (ref 80.0–100.0)
Platelets: 133 10*3/uL — ABNORMAL LOW (ref 150–400)
RBC: 3.44 MIL/uL — ABNORMAL LOW (ref 3.87–5.11)
RDW: 14.1 % (ref 11.5–15.5)
WBC: 10.7 10*3/uL — ABNORMAL HIGH (ref 4.0–10.5)
nRBC: 0 % (ref 0.0–0.2)

## 2022-06-20 LAB — ECHOCARDIOGRAM COMPLETE
Area-P 1/2: 3.5 cm2
MV M vel: 3.76 m/s
MV Peak grad: 56.6 mmHg
MV VTI: 1.27 cm2
P 1/2 time: 569 msec
S' Lateral: 3.2 cm
Single Plane A4C EF: 58.6 %
Weight: 2400 oz

## 2022-06-20 MED ORDER — METOPROLOL SUCCINATE ER 25 MG PO TB24
25.0000 mg | ORAL_TABLET | Freq: Every day | ORAL | Status: DC
  Administered 2022-06-21: 12.5 mg via ORAL
  Filled 2022-06-20: qty 1

## 2022-06-20 MED ORDER — IRBESARTAN 150 MG PO TABS
75.0000 mg | ORAL_TABLET | Freq: Every day | ORAL | Status: DC
  Administered 2022-06-20 – 2022-06-21 (×2): 75 mg via ORAL
  Filled 2022-06-20 (×2): qty 1

## 2022-06-20 MED ORDER — PERFLUTREN LIPID MICROSPHERE
1.0000 mL | INTRAVENOUS | Status: AC | PRN
  Administered 2022-06-20: 2 mL via INTRAVENOUS

## 2022-06-20 MED ORDER — AMLODIPINE BESYLATE 5 MG PO TABS
5.0000 mg | ORAL_TABLET | Freq: Every day | ORAL | Status: DC
  Administered 2022-06-20 – 2022-06-21 (×2): 5 mg via ORAL
  Filled 2022-06-20 (×2): qty 1

## 2022-06-20 NOTE — Progress Notes (Signed)
Rounding Note    Patient Name: Caitlin Lin Date of Encounter: 06/20/2022  [facility] Cardiologist: Debbe OdeaBrian Agbor-Etang, MD   Subjective   The patient is overall feeling much better. She remains in NSR.  Inpatient Medications    Scheduled Meds:  aspirin EC  81 mg Oral Daily   atorvastatin  20 mg Oral Daily   cholecalciferol  1,000 Units Oral Daily   enoxaparin (LOVENOX) injection  30 mg Subcutaneous Q24H   levothyroxine  75 mcg Oral Q0600   metoprolol tartrate  12.5 mg Oral BID   multivitamin with minerals  1 tablet Oral Daily   omega-3 acid ethyl esters  1 g Oral Daily   pantoprazole  40 mg Oral Daily   Continuous Infusions:  cefTRIAXone (ROCEPHIN)  IV Stopped (06/19/22 1045)   PRN Meds: acetaminophen, hydrALAZINE, metoprolol tartrate, ondansetron (ZOFRAN) IV   Vital Signs    Vitals:   06/20/22 0500 06/20/22 0530 06/20/22 0600 06/20/22 0904  BP: (!) 157/68 (!) 156/112 (!) 151/62 (!) 187/67  Pulse: 62 73 (!) 54 62  Resp: 17 17 (!) 21 20  Temp:    98.5 F (36.9 C)  TempSrc:    Oral  SpO2: 100% 100% 97% 100%  Weight:       No intake or output data in the 24 hours ending 06/20/22 0915    06/18/2022    1:31 PM 04/14/2022    9:01 AM 03/26/2022    4:19 PM  Last 3 Weights  Weight (lbs) 150 lb 150 lb 152 lb 9.6 oz  Weight (kg) 68.04 kg 68.04 kg 69.219 kg      Telemetry    NSR HR 60s - Personally Reviewed  ECG    No new - Personally Reviewed  Physical Exam   GEN: No acute distress.   Neck: No JVD Cardiac: RRR, no murmurs, rubs, or gallops.  Respiratory: Clear to auscultation bilaterally. GI: Soft, nontender, non-distended  MS: No edema; No deformity. Neuro:  Nonfocal  Psych: Normal affect   Labs    High Sensitivity Troponin:   Recent Labs  Lab 06/18/22 1348 06/18/22 1542  TROPONINIHS 21* 45*     Chemistry Recent Labs  Lab 06/18/22 1348 06/19/22 0351 06/20/22 0513  NA 137 141 139  K 3.8 4.4 4.3  CL 104 117* 114*  CO2 19*  17* 18*  GLUCOSE 111* 99 81  BUN 24* 25* 34*  CREATININE 1.47* 1.46* 1.43*  CALCIUM 9.2 8.1* 8.4*  MG 1.7  --   --   PROT 7.5  --   --   ALBUMIN 3.8  --   --   AST 37  --   --   ALT 20  --   --   ALKPHOS 129*  --   --   BILITOT 1.3*  --   --   GFRNONAA 36* 36* 37*  ANIONGAP 14 7 7     Lipids No results for input(s): "CHOL", "TRIG", "HDL", "LABVLDL", "LDLCALC", "CHOLHDL" in the last 168 hours.  Hematology Recent Labs  Lab 06/18/22 1348 06/19/22 0351 06/20/22 0513  WBC 2.6* 16.7* 10.7*  RBC 4.52 3.43* 3.44*  HGB 13.6 10.4* 10.4*  HCT 42.0 31.4* 31.9*  MCV 92.9 91.5 92.7  MCH 30.1 30.3 30.2  MCHC 32.4 33.1 32.6  RDW 12.9 13.4 14.1  PLT 161 123* 133*   Thyroid  Recent Labs  Lab 06/18/22 1348  TSH 1.017  FREET4 0.96    BNPNo results for input(s): "BNP", "PROBNP"  in the last 168 hours.  DDimer No results for input(s): "DDIMER" in the last 168 hours.   Radiology    CT CHEST ABDOMEN PELVIS WO CONTRAST  Result Date: 06/18/2022 CLINICAL DATA:  Sepsis.  Lower abdominal and back pain. EXAM: CT CHEST, ABDOMEN AND PELVIS WITHOUT CONTRAST TECHNIQUE: Multidetector CT imaging of the chest, abdomen and pelvis was performed following the standard protocol without IV contrast. RADIATION DOSE REDUCTION: This exam was performed according to the departmental dose-optimization program which includes automated exposure control, adjustment of the mA and/or kV according to patient size and/or use of iterative reconstruction technique. COMPARISON:  04/23/2022 FINDINGS: CT CHEST FINDINGS Cardiovascular: Heart is normal size. Aorta is normal caliber. Moderate coronary artery and aortic calcifications. Mediastinum/Nodes: No mediastinal, hilar, or axillary adenopathy. Trachea and esophagus are unremarkable. Thyroid unremarkable. Lungs/Pleura: Small calcified granuloma posteriorly in the right upper lobe as well as in the lingula. Linear areas of scarring in the lungs bilaterally. No confluent airspace  opacities or effusions. Musculoskeletal: Chest wall soft tissues are unremarkable. No acute bony abnormality. CT ABDOMEN PELVIS FINDINGS Hepatobiliary: No focal hepatic abnormality. Gallbladder unremarkable. Pancreas: No focal abnormality or ductal dilatation. Spleen: No focal abnormality.  Normal size. Adrenals/Urinary Tract: Adrenal glands normal. Bilateral renal extrarenal pelves. No frank hydronephrosis. No stones or mass. Urinary bladder unremarkable. Small amount of gas in the bladder lumen, presumably from recent catheterization. Stomach/Bowel: Sigmoid diverticulosis. No active diverticulitis. Stomach and small bowel decompressed, unremarkable. Vascular/Lymphatic: Aortic atherosclerosis. No evidence of aneurysm or adenopathy. Reproductive: Prior hysterectomy.  No adnexal masses. Other: No free fluid or free air. Musculoskeletal: Lucent lesion within the T12 vertebral body, stable since 2021 most compatible with a benign process. Diffuse degenerative changes and scoliosis. No acute bony abnormality. IMPRESSION: Coronary artery disease, aortic atherosclerosis. Evidence of old granulomatous disease and scarring in the lungs. Sigmoid diverticulosis.  No active diverticulitis. No acute findings in the chest, abdomen or pelvis. Electronically Signed   By: Charlett Nose M.D.   On: 06/18/2022 15:13   DG Chest Port 1 View  Result Date: 06/18/2022 CLINICAL DATA:  Fever, headache, elevated heart rate. EXAM: PORTABLE CHEST 1 VIEW COMPARISON:  Chest radiograph 11/27/2020 FINDINGS: The heart is enlarged, unchanged. The upper mediastinal contours are normal. There is no focal consolidation or pulmonary edema. There is no pleural effusion or pneumothorax There is no acute osseous abnormality. IMPRESSION: No radiographic evidence of acute cardiopulmonary process. Electronically Signed   By: Lesia Hausen M.D.   On: 06/18/2022 14:41    Cardiac Studies   Echo pending  Myoview lexiscan 03/2020 Narrative & Impression   Pharmacological myocardial perfusion imaging study with no significant  Ischemia Small region fixed defect apical, apical lateral wall, possible attenuation artifact Normal wall motion, EF estimated at 92% No EKG changes concerning for ischemia at peak stress or in recovery.  Rare PVC noted CT attenuation correction images with mild diffuse aortic atherosclerosis Low risk scan      Echo 12/2018 1. Left ventricular ejection fraction, by visual estimation, is 60 to  65%. The left ventricle has normal function. Normal left ventricular size.  There is no left ventricular hypertrophy.   2. Global right ventricle has normal systolic function.The right  ventricular size is normal. No increase in right ventricular wall  thickness.   3. Left atrial size was normal.   4. There is mild dilatation of the ascending aorta measuring 37 mm.   5. Mildly elevated pulmonary artery systolic pressure.   6. Left ventricular diastolic  Doppler parameters are consistent with  impaired relaxation pattern of LV diastolic filling.   Patient Profile     82 y.o. female with a hx of CAD, hypertension, hyperlipidemia, hypothyroidism, CKD stage IV, SDH, aortic regurgitation who is being seen 06/19/2022 for the evaluation of A-fib RVR.  Assessment & Plan    Afib with RVR -Patient presented with severe sepsis due to pyelonephritis incidentally found to have A-fib RVR with rates up to 190s given IV Dilt with conversion to normal sinus rhythm -no significant symptoms reported in afib.  - Patient remains in normal sinus rhythm - CHADSVASC at least 3 (HTN, age x2, female). She has a history of SDH (may years ago from a fall). Given afib in the setting of acute infection and SDH no plan for blood thinner unless there is afib reoccurrence - continue Lopressor 12.5mg  BID - keep Mag>2 and K>4 - TSH wnl - echo pending - will likely need heart monitor at some point to check for reoccurrence   Elevated troponin Coronary  artery calcifications on CT -Troponin elevated 21>45, continue to trend troponin -History of coronary artery calcifications on CT in 2021 - normal Myoview lexi in 2022 with no ischemia -No chest pain reported - Suspect supply demand mismatch in the setting of severe sepsis and A-fib RVR -Can pursue further ischemic workup in the outpatient setting   Severe sepsis 2/2 pyelonephritis - Lactic acid normalized  - antibiotics per IM   HTN -Blood pressure stable - PTA meds held - start Lopressor 12.5mg  BID - BP high, can restart PTA amlodipine   CKD stage 4 -Kidney function at baseline   Hypothyroidism -TSH normal   Hypercholesterolemia - PTA lipitor 20mg  daily.   For questions or updates, please contact [facility] Please consult [email or web address] for contact info under        Signed, Armaan Pond David Stall, PA-C  06/20/2022, 9:15 AM

## 2022-06-20 NOTE — Progress Notes (Signed)
OT Cancellation Note  Patient Details Name: Caitlin Lin MRN: [id number] DOB: 1940/10/31   Cancelled Treatment:     Pt screened this date, she is currently independent and appears back to her baseline level of care.  She has no OT skilled needs at this time.  Will sign off, please reconsult if any needs arise.  Timotheus Salm T Valla Pacey, OTR/L, CLT   [name] 06/20/2022, 11:05 AM

## 2022-06-20 NOTE — Progress Notes (Signed)
PT Cancellation Note  Patient Details Name: Caitlin Lin MRN: [id number] DOB: [birth year]   Cancelled Treatment:    Reason Eval/Treat Not Completed: PT screened, no needs identified, will sign off PT orders received, chart reviewed. Pt received sitting in recliner reporting she's been up & walking to the bathroom independently, denies issues & states she's at PLOF. No acute PT needs at this time. PT to complete orders. Please re-consult if new needs arise.  Aleda Grana, PT, DPT 06/20/22, 10:05 AM   Sandi Mariscal 06/20/2022, 10:04 AM

## 2022-06-20 NOTE — Progress Notes (Signed)
PROGRESS NOTE    Caitlin Lin   NWG:[id number] DOB: 12/27/1940  DOA: 06/18/2022 Date of Service: 06/20/22 PCP: Dale Hardeeville, MD     Brief Narrative / Hospital Course:  Caitlin Lin is a 82 y.o. female with medical history significant of CAD, HTN, HLD, hypothyroidism, CKD-4, SDH, aortic regurgitation, who presents with flank pain and increased urinary frequency x2+ days.  04/04: new onset atrial fibrillation with heart rate up to 186, which is converted to sinus rhythm after giving 10 mg of IV Cardizem and 60 mg of oral Cardizem in ED. --> heart rate 90-100s. WBC 2.6, lactic acid 5.4 --> 2.2, positive urinalysis (cloudy appearance, large amount of leukocyte, negative bacteria, WBC> 50), troponin 21 --> 45,  renal function close to baseline, TSH 1.017, temperature 99.7, blood pressure 116/59, RR 35, oxygen saturation 99% on room air.  Chest x-ray negative.  Patient is admitted to PCU as inpatient.  Dr. Kirke Corin of cardiology is consulted.  04/05: remains tachypneic, BP soft but not severely hypotensive. WBC up to 16.7 = still meets sepsis criteria. (+)BCx EColi, pending susceptibilities. 04/06: WBC down, renal fxn stable, VSS. BP up, restarting home meds, Pending susceptibilities for culture   Consultants:  Cardiology - [facility]  Procedures: none  Antibiotics:  04/04 in ED - cefepime, metronidazole, vancomycin x1 04/05: ceftriaxone 2 g IV q24h    ASSESSMENT & PLAN:   Principal Problem:   Severe sepsis Active Problems:   Atrial fibrillation with RVR   Myocardial injury   Acute pyelonephritis   Hypertension   CKD (chronic kidney disease) stage 4, GFR 15-29 ml/min   Hypothyroidism   Hypercholesterolemia   Aortic atherosclerosis   Coronary artery disease by CT  Severe sepsis due to acute pyelonephritis:  pt has severe sepsis on admission WBC 2.6, tachycardia, tachypnea with RR up to 35.  Lactic acid 5.4 --> 2.2. IV Rocephin (patient received 1 dose of vancomycin, Flagyl and  cefepime in ED) Follow-up blood culture --> (+)ecoli  Follow-up urine culture --> pend suscpetibilities Trend lactic acid level --> now WNL check procalcitonin level IV fluid: 2.5 L normal saline, then 75 cc/h  Atrial fibrillation with RVR: likely triggered by severe sepsis.   CHA2DS2-VASc Score is 5.  Since pt is already converted to sinus rhythm, did not start anticoagulants now.  No anticoagulation needed unless Afib returns while adequately treated for sepsis  Cardiology following  Zio monitor on discharge  As needed metoprolol 5 mg every 2 hour for heart rate> 125 maintain magnesium level above 2. maintain potassium level above 4   Myocardial injury trop 21 --> 45. No CP check A1c and FLP  ASA and Lipitor    Essential Hypertension IV hydralazine as needed held amlodipine and Micardis since patient was at high risk of developing hypotension due to severe sepsis but now BP up - restarting    CKD (chronic kidney disease) stage 4, GFR 15-29 ml/min:  Renal function close to baseline creatinine 1.5-1.6.   On admission, creatinine is 1.47, BUN 24, GFR 36 monitor BMP --> stable    Hypothyroidism Continue Synthroid   Hypercholesterolemia Aortic atherosclerosis per CT Lipitor    DVT prophylaxis: lovenox  Pertinent IV fluids/nutrition: NS 75 mL/h was d/c today, heart healthy diet  Central lines / invasive devices: none  Code Status:  FULL CODE   Current Admission Status: inpatient   TOC needs / Dispo plan: TBD Barriers to discharge / significant pending items: pending culture susceptibilities, anticipate here 1-2 more days  given bacteremia              Subjective / Brief ROS:  Patient reports improvement today but still feeling tired Denies CP/SOB.  Pain controlled.  Denies new weakness.  Tolerating diet.  Reports no concerns w/ urination/defecation.   Family Communication: husband at bedside on rounds     Objective Findings:  Vitals:   06/20/22  0500 06/20/22 0530 06/20/22 0600 06/20/22 0904  BP: (!) 157/68 (!) 156/112 (!) 151/62 (!) 187/67  Pulse: 62 73 (!) 54 62  Resp: 17 17 (!) 21 20  Temp:    98.5 F (36.9 C)  TempSrc:    Oral  SpO2: 100% 100% 97% 100%  Weight:       No intake or output data in the 24 hours ending 06/20/22 1344  Filed Weights   06/18/22 1331  Weight: 68 kg    Examination:  Physical Exam Constitutional:      General: She is not in acute distress. Cardiovascular:     Rate and Rhythm: Normal rate and regular rhythm.  Pulmonary:     Effort: Pulmonary effort is normal.     Breath sounds: Normal breath sounds.  Abdominal:     General: Abdomen is flat. Bowel sounds are normal.     Palpations: Abdomen is soft.  Skin:    General: Skin is warm and dry.  Neurological:     General: No focal deficit present.     Mental Status: She is alert.  Psychiatric:        Mood and Affect: Mood normal.        Behavior: Behavior normal.          Scheduled Medications:   amLODipine  5 mg Oral Daily   aspirin EC  81 mg Oral Daily   atorvastatin  20 mg Oral Daily   cholecalciferol  1,000 Units Oral Daily   enoxaparin (LOVENOX) injection  30 mg Subcutaneous Q24H   irbesartan  75 mg Oral Daily   levothyroxine  75 mcg Oral Q0600   [START ON 06/21/2022] metoprolol succinate  25 mg Oral Daily   metoprolol tartrate  12.5 mg Oral BID   multivitamin with minerals  1 tablet Oral Daily   omega-3 acid ethyl esters  1 g Oral Daily   pantoprazole  40 mg Oral Daily    Continuous Infusions:  cefTRIAXone (ROCEPHIN)  IV 2 g (06/20/22 1326)    PRN Medications:  acetaminophen, hydrALAZINE, metoprolol tartrate, ondansetron (ZOFRAN) IV  Antimicrobials from admission:  Anti-infectives (From admission, onward)    Start     Dose/Rate Route Frequency Ordered Stop   06/19/22 1000  cefTRIAXone (ROCEPHIN) 1 g in sodium chloride 0.9 % 100 mL IVPB  Status:  Discontinued        1 g 200 mL/hr over 30 Minutes Intravenous Every  24 hours 06/18/22 1744 06/19/22 0343   06/19/22 1000  cefTRIAXone (ROCEPHIN) 2 g in sodium chloride 0.9 % 100 mL IVPB        2 g 200 mL/hr over 30 Minutes Intravenous Every 24 hours 06/19/22 0343     06/18/22 1500  vancomycin (VANCOREADY) IVPB 1250 mg/250 mL        1,250 mg 166.7 mL/hr over 90 Minutes Intravenous  Once 06/18/22 1446 06/18/22 1800   06/18/22 1445  ceFEPIme (MAXIPIME) 2 g in sodium chloride 0.9 % 100 mL IVPB        2 g 200 mL/hr over 30 Minutes Intravenous  Once 06/18/22 1436  06/18/22 1513   06/18/22 1445  metroNIDAZOLE (FLAGYL) IVPB 500 mg        500 mg 100 mL/hr over 60 Minutes Intravenous  Once 06/18/22 1436 06/18/22 1609           Data Reviewed:  I have personally reviewed the following...  CBC: Recent Labs  Lab 06/18/22 1348 06/19/22 0351 06/20/22 0513  WBC 2.6* 16.7* 10.7*  NEUTROABS 2.1  --   --   HGB 13.6 10.4* 10.4*  HCT 42.0 31.4* 31.9*  MCV 92.9 91.5 92.7  PLT 161 123* 133*   Basic Metabolic Panel: Recent Labs  Lab 06/18/22 1348 06/19/22 0351 06/20/22 0513  NA 137 141 139  K 3.8 4.4 4.3  CL 104 117* 114*  CO2 19* 17* 18*  GLUCOSE 111* 99 81  BUN 24* 25* 34*  CREATININE 1.47* 1.46* 1.43*  CALCIUM 9.2 8.1* 8.4*  MG 1.7  --   --    GFR: Estimated Creatinine Clearance: 29.2 mL/min (A) (by C-G formula based on SCr of 1.43 mg/dL (H)). Liver Function Tests: Recent Labs  Lab 06/18/22 1348  AST 37  ALT 20  ALKPHOS 129*  BILITOT 1.3*  PROT 7.5  ALBUMIN 3.8   No results for input(s): "LIPASE", "AMYLASE" in the last 168 hours. No results for input(s): "AMMONIA" in the last 168 hours. Coagulation Profile: Recent Labs  Lab 06/18/22 1348  INR 1.1   Cardiac Enzymes: No results for input(s): "CKTOTAL", "CKMB", "CKMBINDEX", "TROPONINI" in the last 168 hours. BNP (last 3 results) No results for input(s): "PROBNP" in the last 8760 hours. HbA1C: No results for input(s): "HGBA1C" in the last 72 hours. CBG: No results for input(s):  "GLUCAP" in the last 168 hours. Lipid Profile: No results for input(s): "CHOL", "HDL", "LDLCALC", "TRIG", "CHOLHDL", "LDLDIRECT" in the last 72 hours. Thyroid Function Tests: Recent Labs    06/18/22 1348  TSH 1.017  FREET4 0.96   Anemia Panel: No results for input(s): "VITAMINB12", "FOLATE", "FERRITIN", "TIBC", "IRON", "RETICCTPCT" in the last 72 hours. Most Recent Urinalysis On File:     Component Value Date/Time   COLORURINE YELLOW (A) 06/18/2022 1355   COLORURINE YELLOW (A) 06/18/2022 1355   APPEARANCEUR CLOUDY (A) 06/18/2022 1355   APPEARANCEUR CLOUDY (A) 06/18/2022 1355   LABSPEC 1.006 06/18/2022 1355   LABSPEC 1.008 06/18/2022 1355   PHURINE 5.0 06/18/2022 1355   PHURINE 5.0 06/18/2022 1355   GLUCOSEU NEGATIVE 06/18/2022 1355   GLUCOSEU NEGATIVE 06/18/2022 1355   GLUCOSEU NEGATIVE 07/22/2018 0839   HGBUR MODERATE (A) 06/18/2022 1355   HGBUR MODERATE (A) 06/18/2022 1355   BILIRUBINUR NEGATIVE 06/18/2022 1355   BILIRUBINUR NEGATIVE 06/18/2022 1355   BILIRUBINUR neg 03/07/2012 1043   KETONESUR NEGATIVE 06/18/2022 1355   KETONESUR NEGATIVE 06/18/2022 1355   PROTEINUR 30 (A) 06/18/2022 1355   PROTEINUR 30 (A) 06/18/2022 1355   UROBILINOGEN 0.2 07/22/2018 0839   NITRITE NEGATIVE 06/18/2022 1355   NITRITE NEGATIVE 06/18/2022 1355   LEUKOCYTESUR LARGE (A) 06/18/2022 1355   LEUKOCYTESUR LARGE (A) 06/18/2022 1355   Sepsis Labs: @LABRCNTIP (procalcitonin:4,lacticidven:4) Microbiology: Recent Results (from the past 240 hour(s))  Culture, blood (Routine x 2)     Status: Abnormal (Preliminary result)   Collection Time: 06/18/22  1:48 PM   Specimen: BLOOD  Result Value Ref Range Status   Specimen Description   Final    BLOOD LEFT AC Performed at [facility], [address], [place], [place] [address]    Special Requests   Final  BOTTLES DRAWN AEROBIC AND ANAEROBIC Blood Culture results may not be optimal due to an excessive volume of blood received in culture  bottles Performed at [facility], [address] [place], [place] [address]    Culture  Setup Time   Final    GRAM NEGATIVE RODS IN BOTH AEROBIC AND ANAEROBIC BOTTLES CRITICAL RESULT CALLED TO, READ BACK BY AND VERIFIED WITH: NATHAN BLUE@0314  06/19/22 RH    Culture (A)  Final    ESCHERICHIA COLI SUSCEPTIBILITIES TO FOLLOW Performed at [facility], [address], [place], [place] [address]    Report Status PENDING  Incomplete  Culture, blood (Routine x 2)     Status: None (Preliminary result)   Collection Time: 06/18/22  1:48 PM   Specimen: BLOOD  Result Value Ref Range Status   Specimen Description   Final    BLOOD RIGHT [facility] Performed at [facility], [address] [place], [place] [address]    Special Requests   Final    BOTTLES DRAWN AEROBIC AND ANAEROBIC Blood Culture adequate volume Performed at [facility], [address] [place], [place] [address]    Culture  Setup Time   Final    GRAM NEGATIVE RODS AEROBIC BOTTLE ONLY CRITICAL VALUE NOTED.  VALUE IS CONSISTENT WITH PREVIOUSLY REPORTED AND CALLED VALUE. Performed at [facility], [address] [place], [place], [place] [address]    Culture   Final    Romie Minus NEGATIVE RODS IDENTIFICATION TO FOLLOW Performed at [facility], [address] [place], [place] [address]    Report Status PENDING  Incomplete  Blood Culture ID Panel (Reflexed)     Status: Abnormal   Collection Time: 06/18/22  1:48 PM  Result Value Ref Range Status   Enterococcus faecalis NOT DETECTED NOT DETECTED Final   Enterococcus Faecium NOT DETECTED NOT DETECTED Final   Listeria monocytogenes NOT DETECTED NOT DETECTED Final   Staphylococcus species NOT DETECTED NOT DETECTED Final   Staphylococcus aureus (BCID) NOT DETECTED NOT DETECTED Final   Staphylococcus epidermidis NOT DETECTED NOT DETECTED Final   Staphylococcus lugdunensis NOT DETECTED NOT DETECTED Final   Streptococcus species NOT DETECTED NOT  DETECTED Final   Streptococcus agalactiae NOT DETECTED NOT DETECTED Final   Streptococcus pneumoniae NOT DETECTED NOT DETECTED Final   Streptococcus pyogenes NOT DETECTED NOT DETECTED Final   A.calcoaceticus-baumannii NOT DETECTED NOT DETECTED Final   Bacteroides fragilis NOT DETECTED NOT DETECTED Final   Enterobacterales DETECTED (A) NOT DETECTED Final    Comment: Enterobacterales represent a large order of gram negative bacteria, not a single organism. CRITICAL RESULT CALLED TO, READ BACK BY AND VERIFIED WITH: NATHAN BLUE@0314  06/19/22 RH    Enterobacter cloacae complex NOT DETECTED NOT DETECTED Final   Escherichia coli DETECTED (A) NOT DETECTED Final    Comment: CRITICAL RESULT CALLED TO, READ BACK BY AND VERIFIED WITH: NATHAN BLUE@0314  06/19/22 RH    Klebsiella aerogenes NOT DETECTED NOT DETECTED Final   Klebsiella oxytoca NOT DETECTED NOT DETECTED Final   Klebsiella pneumoniae NOT DETECTED NOT DETECTED Final   Proteus species NOT DETECTED NOT DETECTED Final   Salmonella species NOT DETECTED NOT DETECTED Final   Serratia marcescens NOT DETECTED NOT DETECTED Final   Haemophilus influenzae NOT DETECTED NOT DETECTED Final   Neisseria meningitidis NOT DETECTED NOT DETECTED Final   Pseudomonas aeruginosa NOT DETECTED NOT DETECTED Final   Stenotrophomonas maltophilia NOT DETECTED NOT DETECTED Final   Candida albicans NOT DETECTED NOT DETECTED Final  Candida auris NOT DETECTED NOT DETECTED Final   Candida glabrata NOT DETECTED NOT DETECTED Final   Candida krusei NOT DETECTED NOT DETECTED Final   Candida parapsilosis NOT DETECTED NOT DETECTED Final   Candida tropicalis NOT DETECTED NOT DETECTED Final   Cryptococcus neoformans/gattii NOT DETECTED NOT DETECTED Final   CTX-M ESBL NOT DETECTED NOT DETECTED Final   Carbapenem resistance IMP NOT DETECTED NOT DETECTED Final   Carbapenem resistance KPC NOT DETECTED NOT DETECTED Final   Carbapenem resistance NDM NOT DETECTED NOT DETECTED Final    Carbapenem resist OXA 48 LIKE NOT DETECTED NOT DETECTED Final   Carbapenem resistance VIM NOT DETECTED NOT DETECTED Final    Comment: Performed at [facility], [address], [place], [place] [address]  Urine Culture     Status: Abnormal (Preliminary result)   Collection Time: 06/18/22  1:55 PM   Specimen: Urine, Random  Result Value Ref Range Status   Specimen Description   Final    URINE, RANDOM Performed at [facility], [address] [place], [place] [address]    Special Requests   Final    NONE Reflexed from [id number] Performed at [facility], [address] [place], [place] [address]    Culture (A)  Final    >=100,000 COLONIES/mL ESCHERICHIA COLI SUSCEPTIBILITIES TO FOLLOW Performed at [facility], [address] [place], [place] [address]    Report Status PENDING  Incomplete      Radiology Studies last 3 days: ECHOCARDIOGRAM COMPLETE  Result Date: 06/20/2022    ECHOCARDIOGRAM REPORT   Patient Name:   Caitlin Lin Date of Exam: 06/20/2022 Medical Rec #:  [id number]        Height:       64.0 in Accession #:    [id number]       Weight:       150.0 lb Date of Birth:  March 30, 1940         BSA:          1.731 m Patient Age:    82 years         BP:           151/62 mmHg Patient Gender: F                HR:           66 bpm. Exam Location:  [facility] Procedure: 2D Echo, Cardiac Doppler, Color Doppler and Intracardiac            Opacification Agent Indications:    Atrial fibrillation  History:        Patient has prior history of Echocardiogram examinations, most                 recent 01/05/2019. CAD, Aortic Valve Disease,                 Signs/Symptoms:Murmur; Risk Factors:Hypertension and                 Dyslipidemia. CKD.  Sonographer:    Milda Smart Referring Phys: [id number] BRIAN AGBOR-ETANG  Sonographer Comments: Technically difficult study due to poor echo windows. Image acquisition challenging due to patient body habitus and Image acquisition  challenging due to respiratory motion. IMPRESSIONS  1. Left ventricular ejection fraction, by estimation, is 55 to 60%. The left ventricle has normal function. The left ventricle has no regional wall motion abnormalities. Left ventricular diastolic parameters are consistent with Grade II diastolic dysfunction (pseudonormalization).  2.  Right ventricular systolic function is normal. The right ventricular size is normal.  3. The mitral valve is normal in structure. Mild mitral valve regurgitation.  4. The aortic valve is normal in structure. Aortic valve regurgitation is mild to moderate. FINDINGS  Left Ventricle: Left ventricular ejection fraction, by estimation, is 55 to 60%. The left ventricle has normal function. The left ventricle has no regional wall motion abnormalities. Definity contrast agent was given IV to delineate the left ventricular  endocardial borders. The left ventricular internal cavity size was normal in size. There is borderline left ventricular hypertrophy. Left ventricular diastolic parameters are consistent with Grade II diastolic dysfunction (pseudonormalization). Right Ventricle: The right ventricular size is normal. No increase in right ventricular wall thickness. Right ventricular systolic function is normal. Left Atrium: Left atrial size was normal in size. Right Atrium: Right atrial size was normal in size. Pericardium: There is no evidence of pericardial effusion. Mitral Valve: The mitral valve is normal in structure. Mild mitral valve regurgitation. MV peak gradient, 5.8 mmHg. The mean mitral valve gradient is 3.0 mmHg. Tricuspid Valve: The tricuspid valve is normal in structure. Tricuspid valve regurgitation is mild. Aortic Valve: The aortic valve is normal in structure. Aortic valve regurgitation is mild to moderate. Aortic regurgitation PHT measures 569 msec. Pulmonic Valve: The pulmonic valve was normal in structure. Pulmonic valve regurgitation is not visualized. Aorta: The  ascending aorta was not well visualized. IAS/Shunts: No atrial level shunt detected by color flow Doppler.  LEFT VENTRICLE PLAX 2D LVIDd:         4.50 cm      Diastology LVIDs:         3.20 cm      LV e' medial:    8.49 cm/s LV PW:         1.40 cm      LV E/e' medial:  13.5 LV IVS:        0.80 cm      LV e' lateral:   7.62 cm/s LVOT diam:     1.90 cm      LV E/e' lateral: 15.1 LV SV:         59 LV SV Index:   34 LVOT Area:     2.84 cm  LV Volumes (MOD) LV vol d, MOD A4C: 100.0 ml LV vol s, MOD A4C: 41.4 ml LV SV MOD A4C:     100.0 ml RIGHT VENTRICLE             IVC RV S prime:     11.00 cm/s  IVC diam: 2.50 cm TAPSE (M-mode): 2.2 cm LEFT ATRIUM             Index        RIGHT ATRIUM           Index LA diam:        3.70 cm 2.14 cm/m   RA Area:     14.40 cm LA Vol (A2C):   56.2 ml 32.46 ml/m  RA Volume:   34.40 ml  19.87 ml/m LA Vol (A4C):   41.0 ml 23.68 ml/m LA Biplane Vol: 49.1 ml 28.36 ml/m  AORTIC VALVE LVOT Vmax:   105.00 cm/s LVOT Vmean:  71.900 cm/s LVOT VTI:    0.209 m AI PHT:      569 msec  AORTA Ao Root diam: 3.10 cm Ao Asc diam:  3.70 cm MITRAL VALVE  TRICUSPID VALVE MV Area (PHT): 3.50 cm     TR Peak grad:   23.8 mmHg MV Area VTI:   1.27 cm     TR Mean grad:   18.0 mmHg MV Peak grad:  5.8 mmHg     TR Vmax:        244.00 cm/s MV Mean grad:  3.0 mmHg     TR Vmean:       206.0 cm/s MV Vmax:       1.20 m/s MV Vmean:      75.7 cm/s    SHUNTS MV Decel Time: 217 msec     Systemic VTI:  0.21 m MR Peak grad: 56.6 mmHg     Systemic Diam: 1.90 cm MR Vmax:      376.00 cm/s MV E velocity: 115.00 cm/s MV A velocity: 84.40 cm/s MV E/A ratio:  1.36 Dwayne Salome Arnt Callwood MD Electronically signed by Alwyn Peawayne D Callwood MD Signature Date/Time: 06/20/2022/12:02:10 PM    Final    CT CHEST ABDOMEN PELVIS WO CONTRAST  Result Date: 06/18/2022 CLINICAL DATA:  Sepsis.  Lower abdominal and back pain. EXAM: CT CHEST, ABDOMEN AND PELVIS WITHOUT CONTRAST TECHNIQUE: Multidetector CT imaging of the chest, abdomen and  pelvis was performed following the standard protocol without IV contrast. RADIATION DOSE REDUCTION: This exam was performed according to the departmental dose-optimization program which includes automated exposure control, adjustment of the mA and/or kV according to patient size and/or use of iterative reconstruction technique. COMPARISON:  04/23/2022 FINDINGS: CT CHEST FINDINGS Cardiovascular: Heart is normal size. Aorta is normal caliber. Moderate coronary artery and aortic calcifications. Mediastinum/Nodes: No mediastinal, hilar, or axillary adenopathy. Trachea and esophagus are unremarkable. Thyroid unremarkable. Lungs/Pleura: Small calcified granuloma posteriorly in the right upper lobe as well as in the lingula. Linear areas of scarring in the lungs bilaterally. No confluent airspace opacities or effusions. Musculoskeletal: Chest wall soft tissues are unremarkable. No acute bony abnormality. CT ABDOMEN PELVIS FINDINGS Hepatobiliary: No focal hepatic abnormality. Gallbladder unremarkable. Pancreas: No focal abnormality or ductal dilatation. Spleen: No focal abnormality.  Normal size. Adrenals/Urinary Tract: Adrenal glands normal. Bilateral renal extrarenal pelves. No frank hydronephrosis. No stones or mass. Urinary bladder unremarkable. Small amount of gas in the bladder lumen, presumably from recent catheterization. Stomach/Bowel: Sigmoid diverticulosis. No active diverticulitis. Stomach and small bowel decompressed, unremarkable. Vascular/Lymphatic: Aortic atherosclerosis. No evidence of aneurysm or adenopathy. Reproductive: Prior hysterectomy.  No adnexal masses. Other: No free fluid or free air. Musculoskeletal: Lucent lesion within the T12 vertebral body, stable since 2021 most compatible with a benign process. Diffuse degenerative changes and scoliosis. No acute bony abnormality. IMPRESSION: Coronary artery disease, aortic atherosclerosis. Evidence of old granulomatous disease and scarring in the lungs.  Sigmoid diverticulosis.  No active diverticulitis. No acute findings in the chest, abdomen or pelvis. Electronically Signed   By: Charlett NoseKevin  Dover M.D.   On: 06/18/2022 15:13   DG Chest Port 1 View  Result Date: 06/18/2022 CLINICAL DATA:  Fever, headache, elevated heart rate. EXAM: PORTABLE CHEST 1 VIEW COMPARISON:  Chest radiograph 11/27/2020 FINDINGS: The heart is enlarged, unchanged. The upper mediastinal contours are normal. There is no focal consolidation or pulmonary edema. There is no pleural effusion or pneumothorax There is no acute osseous abnormality. IMPRESSION: No radiographic evidence of acute cardiopulmonary process. Electronically Signed   By: Lesia HausenPeter  Noone M.D.   On: 06/18/2022 14:41             LOS: 2 days  Sunnie Nielsen, DO [facility] 06/20/2022, 1:44 PM    Dictation software may have been used to generate the above note. Typos may occur and escape review in typed/dictated notes. Please contact Dr Lyn Hollingshead directly for clarity if needed.  Staff may message me via secure chat in Epic  but this may not receive an immediate response,  please page me for urgent matters!  If 7PM-7AM, please contact night coverage [email or web address]

## 2022-06-20 NOTE — Progress Notes (Signed)
  Echocardiogram 2D Echocardiogram has been performed.  Milda Smart 06/20/2022, 8:28 AM

## 2022-06-21 ENCOUNTER — Telehealth: Payer: Self-pay | Admitting: Internal Medicine

## 2022-06-21 DIAGNOSIS — R6520 Severe sepsis without septic shock: Secondary | ICD-10-CM | POA: Diagnosis not present

## 2022-06-21 DIAGNOSIS — A419 Sepsis, unspecified organism: Secondary | ICD-10-CM | POA: Diagnosis not present

## 2022-06-21 LAB — URINE CULTURE: Culture: >=100000 — AB

## 2022-06-21 LAB — CULTURE, BLOOD (ROUTINE X 2): Special Requests: ADEQUATE

## 2022-06-21 MED ORDER — METOPROLOL TARTRATE 25 MG PO TABS: 12.5000 mg | ORAL_TABLET | Freq: Two times a day (BID) | ORAL | 0 refills | Status: DC

## 2022-06-21 MED ORDER — METOPROLOL SUCCINATE ER 25 MG PO TB24: 25.0000 mg | ORAL_TABLET | Freq: Every day | ORAL | 0 refills | Status: DC

## 2022-06-21 MED ORDER — CIPROFLOXACIN HCL 500 MG PO TABS: 500.0000 mg | ORAL_TABLET | Freq: Two times a day (BID) | ORAL | 0 refills | Status: AC

## 2022-06-21 NOTE — Plan of Care (Signed)

## 2022-06-21 NOTE — Telephone Encounter (Signed)
Discharged from hospital 06/21/22.  Needs TCM and hospital follow up. Thanks.

## 2022-06-21 NOTE — Discharge Summary (Addendum)
Physician Discharge Summary   Patient: Caitlin Lin MRN: [id number]  DOB: [birth year]   Admit:     Date of Admission: 06/18/2022 Admitted from: home   Discharge: Date of discharge: 06/21/22 Disposition: Home Condition at discharge: good  CODE STATUS: FULL CODE     Discharge Physician: Sunnie Nielsen, DO [facility]     PCP: Dale [place], MD  Recommendations for Outpatient Follow-up:  Follow up with PCP Dale [place], MD in 2-3 weeks Please obtain labs/tests: CBC, BMP, consider UA in 2-3 weeks Please follow up on the following pending results: none PCP AND OTHER OUTPATIENT PROVIDERS: SEE BELOW FOR SPECIFIC DISCHARGE INSTRUCTIONS PRINTED FOR PATIENT IN ADDITION TO GENERIC AVS PATIENT INFO  Ensure follow up with cardiology    Discharge Instructions     Diet - low sodium heart healthy   Complete by: As directed    Discharge instructions   Complete by: As directed    Cardiology office will mail you heart monitor - please follow up with their office as directed. Please see your PCP within 2 weeks for hospital follow-up appointment, seek medical care sooner if needed.   Increase activity slowly   Complete by: As directed          Discharge Diagnoses: Principal Problem:   Severe sepsis Active Problems:   Atrial fibrillation with rapid ventricular response   Myocardial injury   Acute pyelonephritis   Hypertension   CKD (chronic kidney disease) stage 4, GFR 15-29 ml/min   Hypothyroidism   Hypercholesterolemia   Aortic atherosclerosis   Coronary artery disease by CT   Sepsis       Hospital Course: Caitlin Lin is a 82 y.o. female with medical history significant of CAD, HTN, HLD, hypothyroidism, CKD-4, SDH, aortic regurgitation, who presents with flank pain and increased urinary frequency x2+ days.  04/04: new onset atrial fibrillation with heart rate up to 186, which is converted to sinus rhythm after giving 10 mg of IV Cardizem and 60  mg of oral Cardizem in ED. --> heart rate 90-100s. WBC 2.6, lactic acid 5.4 --> 2.2, positive urinalysis (cloudy appearance, large amount of leukocyte, negative bacteria, WBC> 50), troponin 21 --> 45,  renal function close to baseline, TSH 1.017, temperature 99.7, blood pressure 116/59, RR 35, oxygen saturation 99% on room air.  Chest x-ray negative.  Patient is admitted to PCU as inpatient.  Dr. Kirke Corin of cardiology is consulted.  04/05: remains tachypneic, BP soft but not severely hypotensive. WBC up to 16.7 = still meets sepsis criteria. (+)BCx EColi, pending susceptibilities. 04/06: WBC down to 10.7, renal fxn stable, VSS. BP up, restarting home meds, Pending susceptibilities for culture  04/07: Ecoli pan-sensitive. Afebrile x48 hours. Finishing IV dose abx and start po tomorrow to complete 10 days therapy (06/28/22). Cardiology office to mail heart monitor to patient.   Consultants:  Cardiology - [facility]  Procedures: none  Antibiotics:  04/04 in ED - cefepime, metronidazole, vancomycin x1 04/05-04/07: ceftriaxone 2 g IV q24h    ASSESSMENT & PLAN:   Principal Problem:   Severe sepsis Active Problems:   Atrial fibrillation with RVR   Myocardial injury   Acute pyelonephritis   Hypertension   CKD (chronic kidney disease) stage 4, GFR 15-29 ml/min   Hypothyroidism   Hypercholesterolemia   Aortic atherosclerosis   Coronary artery disease by CT  Severe sepsis due to acute pyelonephritis:  10 days abx total Sent home w/ rx for ciprofloxacin no known allergy to this  Atrial fibrillation with RVR: likely triggered by severe sepsis.   CHA2DS2-VASc Score is 5.  Since pt is already converted to sinus rhythm, did not start anticoagulants now.  No anticoagulation needed unless Afib returns while adequately treated for sepsis  Cardiology following  Zio monitor on discharge - cardiology office ot mail this  Po metoprolol  maintain magnesium level above 2. maintain potassium level above  4   Myocardial injury trop 21 --> 45. No CP check A1c and FLP  ASA and Lipitor    Essential Hypertension held amlodipine and Micardis since patient was at high risk of developing hypotension due to severe sepsis but now BP up - restarting    CKD (chronic kidney disease) stage 4, GFR 15-29 ml/min:  Renal function close to baseline creatinine 1.5-1.6.   On admission, creatinine is 1.47, BUN 24, GFR 36 monitor BMP --> stable    Hypothyroidism Continue Synthroid   Hypercholesterolemia Aortic atherosclerosis per CT Lipitor              Discharge Instructions  Allergies as of 06/21/2022       Reactions   Quinapril Other (See Comments)   Hyperkalemia   Codeine Rash        Medication List     TAKE these medications    Alpha-Lipoic Acid 200 MG Tabs Take 200 mg by mouth 3 (three) times daily.   amLODipine 5 MG tablet Commonly known as: NORVASC Take 1 tablet (5 mg total) by mouth daily.   aspirin 81 MG tablet Take 1 tablet (81 mg total) by mouth daily.   atorvastatin 20 MG tablet Commonly known as: LIPITOR Take 1 tablet (20 mg total) by mouth daily.   BENEFIBER PO Take by mouth.   Cholecalciferol 25 MCG (1000 UT) tablet Take 1,000 Units by mouth daily.   ciprofloxacin 500 MG tablet Commonly known as: CIPRO Take 1 tablet (500 mg total) by mouth 2 (two) times daily for 6 days. Start taking on: June 22, 2022   Fish Oil 1000 MG Caps Take 1,000 mg by mouth daily.   fluticasone 50 MCG/ACT nasal spray Commonly known as: FLONASE   GLUCOSAMINE CHONDR 500 COMPLEX PO Take 2 tablets by mouth daily.   levothyroxine 75 MCG tablet Commonly known as: SYNTHROID TAKE 1 TABLET BY MOUTH ONCE DAILY IN THE MORNING BEFORE BREAKFAST   metoprolol tartrate 25 MG tablet Commonly known as: LOPRESSOR Take 0.5 tablets (12.5 mg total) by mouth 2 (two) times daily.   MIRALAX PO Take by mouth as needed.   multivitamin tablet Take 1 tablet by mouth daily.    pantoprazole 40 MG tablet Commonly known as: PROTONIX Take 1 tablet (40 mg total) by mouth daily. Take 30 minutes before breakfast   PROBIOTIC DAILY PO Take by mouth daily.   STOOL SOFTENER PO Take by mouth.   telmisartan 20 MG tablet Commonly known as: MICARDIS Take 1 tablet by mouth daily.          Allergies  Allergen Reactions   Quinapril Other (See Comments)    Hyperkalemia   Codeine Rash     Subjective: pt feeling well this morning, good energy and appetite, no concerns    Discharge Exam: BP (!) 153/69 (BP Location: Right Arm)   Pulse (!) 55   Temp 97.8 F (36.6 C)   Resp 16   Wt 68 kg   SpO2 100%   BMI 25.75 kg/m  General: Pt is alert, awake, not in acute distress Cardiovascular: RRR, S1/S2 +,  no rubs, no gallops Respiratory: CTA bilaterally, no wheezing, no rhonchi Abdominal: Soft, NT, ND, bowel sounds + Extremities: no edema, no cyanosis     The results of significant diagnostics from this hospitalization (including imaging, microbiology, ancillary and laboratory) are listed below for reference.     Microbiology: Recent Results (from the past 240 hour(s))  Culture, blood (Routine x 2)     Status: Abnormal (Preliminary result)   Collection Time: 06/18/22  1:48 PM   Specimen: BLOOD  Result Value Ref Range Status   Specimen Description   Final    BLOOD LEFT AC Performed at [facility], [address] [place], [place] [address]    Special Requests   Final    BOTTLES DRAWN AEROBIC AND ANAEROBIC Blood Culture results may not be optimal due to an excessive volume of blood received in culture bottles Performed at [facility], [address], [place], [place] [address]    Culture  Setup Time   Final    GRAM NEGATIVE RODS IN BOTH AEROBIC AND ANAEROBIC BOTTLES CRITICAL RESULT CALLED TO, READ BACK BY AND VERIFIED WITH: NATHAN BLUE@0314  06/19/22 RH Performed at [facility], [address], [place], [place] [address]     Culture ESCHERICHIA COLI (A)  Final   Report Status PENDING  Incomplete   Organism ID, Bacteria ESCHERICHIA COLI  Final      Susceptibility   Escherichia coli - MIC*    AMPICILLIN >=32 RESISTANT Resistant     CEFEPIME <=0.12 SENSITIVE Sensitive     CEFTAZIDIME <=1 SENSITIVE Sensitive     CEFTRIAXONE <=0.25 SENSITIVE Sensitive     CIPROFLOXACIN <=0.25 SENSITIVE Sensitive     GENTAMICIN <=1 SENSITIVE Sensitive     IMIPENEM <=0.25 SENSITIVE Sensitive     TRIMETH/SULFA <=20 SENSITIVE Sensitive     AMPICILLIN/SULBACTAM 16 INTERMEDIATE Intermediate     PIP/TAZO <=4 SENSITIVE Sensitive     * ESCHERICHIA COLI  Culture, blood (Routine x 2)     Status: Abnormal   Collection Time: 06/18/22  1:48 PM   Specimen: BLOOD  Result Value Ref Range Status   Specimen Description   Final    BLOOD RIGHT [facility] Performed at [facility], [address] [place], [place] [address]    Special Requests   Final    BOTTLES DRAWN AEROBIC AND ANAEROBIC Blood Culture adequate volume Performed at [facility], [address] [place], [place] [address]    Culture  Setup Time   Final    GRAM NEGATIVE RODS AEROBIC BOTTLE ONLY CRITICAL VALUE NOTED.  VALUE IS CONSISTENT WITH PREVIOUSLY REPORTED AND CALLED VALUE. Performed at [facility], [address] [place], [place] [address]    Culture ESCHERICHIA COLI (A)  Final   Report Status 06/21/2022 FINAL  Final  Blood Culture ID Panel (Reflexed)     Status: Abnormal   Collection Time: 06/18/22  1:48 PM  Result Value Ref Range Status   Enterococcus faecalis NOT DETECTED NOT DETECTED Final   Enterococcus Faecium NOT DETECTED NOT DETECTED Final   Listeria monocytogenes NOT DETECTED NOT DETECTED Final   Staphylococcus species NOT DETECTED NOT DETECTED Final   Staphylococcus aureus (BCID) NOT DETECTED NOT DETECTED Final   Staphylococcus epidermidis NOT DETECTED NOT DETECTED Final   Staphylococcus lugdunensis NOT DETECTED NOT DETECTED Final    Streptococcus species NOT DETECTED NOT DETECTED Final   Streptococcus agalactiae NOT DETECTED NOT DETECTED Final   Streptococcus pneumoniae NOT DETECTED NOT DETECTED Final  Streptococcus pyogenes NOT DETECTED NOT DETECTED Final   A.calcoaceticus-baumannii NOT DETECTED NOT DETECTED Final   Bacteroides fragilis NOT DETECTED NOT DETECTED Final   Enterobacterales DETECTED (A) NOT DETECTED Final    Comment: Enterobacterales represent a large order of gram negative bacteria, not a single organism. CRITICAL RESULT CALLED TO, READ BACK BY AND VERIFIED WITH: NATHAN BLUE@0314  06/19/22 RH    Enterobacter cloacae complex NOT DETECTED NOT DETECTED Final   Escherichia coli DETECTED (A) NOT DETECTED Final    Comment: CRITICAL RESULT CALLED TO, READ BACK BY AND VERIFIED WITH: NATHAN BLUE@0314  06/19/22 RH    Klebsiella aerogenes NOT DETECTED NOT DETECTED Final   Klebsiella oxytoca NOT DETECTED NOT DETECTED Final   Klebsiella pneumoniae NOT DETECTED NOT DETECTED Final   Proteus species NOT DETECTED NOT DETECTED Final   Salmonella species NOT DETECTED NOT DETECTED Final   Serratia marcescens NOT DETECTED NOT DETECTED Final   Haemophilus influenzae NOT DETECTED NOT DETECTED Final   Neisseria meningitidis NOT DETECTED NOT DETECTED Final   Pseudomonas aeruginosa NOT DETECTED NOT DETECTED Final   Stenotrophomonas maltophilia NOT DETECTED NOT DETECTED Final   Candida albicans NOT DETECTED NOT DETECTED Final   Candida auris NOT DETECTED NOT DETECTED Final   Candida glabrata NOT DETECTED NOT DETECTED Final   Candida krusei NOT DETECTED NOT DETECTED Final   Candida parapsilosis NOT DETECTED NOT DETECTED Final   Candida tropicalis NOT DETECTED NOT DETECTED Final   Cryptococcus neoformans/gattii NOT DETECTED NOT DETECTED Final   CTX-M ESBL NOT DETECTED NOT DETECTED Final   Carbapenem resistance IMP NOT DETECTED NOT DETECTED Final   Carbapenem resistance KPC NOT DETECTED NOT DETECTED Final   Carbapenem  resistance NDM NOT DETECTED NOT DETECTED Final   Carbapenem resist OXA 48 LIKE NOT DETECTED NOT DETECTED Final   Carbapenem resistance VIM NOT DETECTED NOT DETECTED Final    Comment: Performed at [facility], [address] [place], [place] [address]  Urine Culture     Status: Abnormal   Collection Time: 06/18/22  1:55 PM   Specimen: Urine, Random  Result Value Ref Range Status   Specimen Description   Final    URINE, RANDOM Performed at [facility], [address] [place], [place] [address]    Special Requests   Final    NONE Reflexed from [id number] Performed at [facility], [address] [place], [place] [address]    Culture >=100,000 COLONIES/mL ESCHERICHIA COLI (A)  Final   Report Status 06/21/2022 FINAL  Final   Organism ID, Bacteria ESCHERICHIA COLI (A)  Final      Susceptibility   Escherichia coli - MIC*    AMPICILLIN 4 SENSITIVE Sensitive     CEFAZOLIN <=4 SENSITIVE Sensitive     CEFEPIME <=0.12 SENSITIVE Sensitive     CEFTRIAXONE <=0.25 SENSITIVE Sensitive     CIPROFLOXACIN <=0.25 SENSITIVE Sensitive     GENTAMICIN <=1 SENSITIVE Sensitive     IMIPENEM <=0.25 SENSITIVE Sensitive     NITROFURANTOIN <=16 SENSITIVE Sensitive     TRIMETH/SULFA <=20 SENSITIVE Sensitive     AMPICILLIN/SULBACTAM <=2 SENSITIVE Sensitive     PIP/TAZO <=4 SENSITIVE Sensitive     * >=100,000 COLONIES/mL ESCHERICHIA COLI     Labs: BNP (last 3 results) No results for input(s): "BNP" in the last 8760 hours. Basic Metabolic Panel: Recent Labs  Lab 06/18/22 1348 06/19/22 0351 06/20/22 0513  NA 137 141 139  K 3.8 4.4 4.3  CL 104 117* 114*  CO2 19* 17*  18*  GLUCOSE 111* 99 81  BUN 24* 25* 34*  CREATININE 1.47* 1.46* 1.43*  CALCIUM 9.2 8.1* 8.4*  MG 1.7  --   --    Liver Function Tests: Recent Labs  Lab 06/18/22 1348  AST 37  ALT 20  ALKPHOS 129*  BILITOT 1.3*  PROT 7.5  ALBUMIN 3.8   No results for input(s): "LIPASE", "AMYLASE" in the last 168  hours. No results for input(s): "AMMONIA" in the last 168 hours. CBC: Recent Labs  Lab 06/18/22 1348 06/19/22 0351 06/20/22 0513  WBC 2.6* 16.7* 10.7*  NEUTROABS 2.1  --   --   HGB 13.6 10.4* 10.4*  HCT 42.0 31.4* 31.9*  MCV 92.9 91.5 92.7  PLT 161 123* 133*   Cardiac Enzymes: No results for input(s): "CKTOTAL", "CKMB", "CKMBINDEX", "TROPONINI" in the last 168 hours. BNP: Invalid input(s): "POCBNP" CBG: No results for input(s): "GLUCAP" in the last 168 hours. D-Dimer No results for input(s): "DDIMER" in the last 72 hours. Hgb A1c No results for input(s): "HGBA1C" in the last 72 hours. Lipid Profile No results for input(s): "CHOL", "HDL", "LDLCALC", "TRIG", "CHOLHDL", "LDLDIRECT" in the last 72 hours. Thyroid function studies Recent Labs    06/18/22 1348  TSH 1.017   Anemia work up No results for input(s): "VITAMINB12", "FOLATE", "FERRITIN", "TIBC", "IRON", "RETICCTPCT" in the last 72 hours. Urinalysis    Component Value Date/Time   COLORURINE YELLOW (A) 06/18/2022 1355   COLORURINE YELLOW (A) 06/18/2022 1355   APPEARANCEUR CLOUDY (A) 06/18/2022 1355   APPEARANCEUR CLOUDY (A) 06/18/2022 1355   LABSPEC 1.006 06/18/2022 1355   LABSPEC 1.008 06/18/2022 1355   PHURINE 5.0 06/18/2022 1355   PHURINE 5.0 06/18/2022 1355   GLUCOSEU NEGATIVE 06/18/2022 1355   GLUCOSEU NEGATIVE 06/18/2022 1355   GLUCOSEU NEGATIVE 07/22/2018 0839   HGBUR MODERATE (A) 06/18/2022 1355   HGBUR MODERATE (A) 06/18/2022 1355   BILIRUBINUR NEGATIVE 06/18/2022 1355   BILIRUBINUR NEGATIVE 06/18/2022 1355   BILIRUBINUR neg 03/07/2012 1043   KETONESUR NEGATIVE 06/18/2022 1355   KETONESUR NEGATIVE 06/18/2022 1355   PROTEINUR 30 (A) 06/18/2022 1355   PROTEINUR 30 (A) 06/18/2022 1355   UROBILINOGEN 0.2 07/22/2018 0839   NITRITE NEGATIVE 06/18/2022 1355   NITRITE NEGATIVE 06/18/2022 1355   LEUKOCYTESUR LARGE (A) 06/18/2022 1355   LEUKOCYTESUR LARGE (A) 06/18/2022 1355   Sepsis Labs Recent Labs   Lab 06/18/22 1348 06/19/22 0351 06/20/22 0513  WBC 2.6* 16.7* 10.7*   Microbiology Recent Results (from the past 240 hour(s))  Culture, blood (Routine x 2)     Status: Abnormal (Preliminary result)   Collection Time: 06/18/22  1:48 PM   Specimen: BLOOD  Result Value Ref Range Status   Specimen Description   Final    BLOOD LEFT AC Performed at [facility], [address] [place], [place] [address]    Special Requests   Final    BOTTLES DRAWN AEROBIC AND ANAEROBIC Blood Culture results may not be optimal due to an excessive volume of blood received in culture bottles Performed at [facility], [address] [place], [place] [address]    Culture  Setup Time   Final    GRAM NEGATIVE RODS IN BOTH AEROBIC AND ANAEROBIC BOTTLES CRITICAL RESULT CALLED TO, READ BACK BY AND VERIFIED WITH: NATHAN BLUE@0314  06/19/22 RH Performed at [facility], [address], [place], [place] [address]    Culture ESCHERICHIA COLI (A)  Final   Report Status PENDING  Incomplete  Organism ID, Bacteria ESCHERICHIA COLI  Final      Susceptibility   Escherichia coli - MIC*    AMPICILLIN >=32 RESISTANT Resistant     CEFEPIME <=0.12 SENSITIVE Sensitive     CEFTAZIDIME <=1 SENSITIVE Sensitive     CEFTRIAXONE <=0.25 SENSITIVE Sensitive     CIPROFLOXACIN <=0.25 SENSITIVE Sensitive     GENTAMICIN <=1 SENSITIVE Sensitive     IMIPENEM <=0.25 SENSITIVE Sensitive     TRIMETH/SULFA <=20 SENSITIVE Sensitive     AMPICILLIN/SULBACTAM 16 INTERMEDIATE Intermediate     PIP/TAZO <=4 SENSITIVE Sensitive     * ESCHERICHIA COLI  Culture, blood (Routine x 2)     Status: Abnormal   Collection Time: 06/18/22  1:48 PM   Specimen: BLOOD  Result Value Ref Range Status   Specimen Description   Final    BLOOD RIGHT AC Performed at [facility], [address], [place], [place] [address]    Special Requests   Final    BOTTLES DRAWN AEROBIC AND ANAEROBIC Blood Culture adequate  volume Performed at [facility], [address] [place], [place] [address]    Culture  Setup Time   Final    GRAM NEGATIVE RODS AEROBIC BOTTLE ONLY CRITICAL VALUE NOTED.  VALUE IS CONSISTENT WITH PREVIOUSLY REPORTED AND CALLED VALUE. Performed at [facility], [address] [place], [place] [address]    Culture ESCHERICHIA COLI (A)  Final   Report Status 06/21/2022 FINAL  Final  Blood Culture ID Panel (Reflexed)     Status: Abnormal   Collection Time: 06/18/22  1:48 PM  Result Value Ref Range Status   Enterococcus faecalis NOT DETECTED NOT DETECTED Final   Enterococcus Faecium NOT DETECTED NOT DETECTED Final   Listeria monocytogenes NOT DETECTED NOT DETECTED Final   Staphylococcus species NOT DETECTED NOT DETECTED Final   Staphylococcus aureus (BCID) NOT DETECTED NOT DETECTED Final   Staphylococcus epidermidis NOT DETECTED NOT DETECTED Final   Staphylococcus lugdunensis NOT DETECTED NOT DETECTED Final   Streptococcus species NOT DETECTED NOT DETECTED Final   Streptococcus agalactiae NOT DETECTED NOT DETECTED Final   Streptococcus pneumoniae NOT DETECTED NOT DETECTED Final   Streptococcus pyogenes NOT DETECTED NOT DETECTED Final   A.calcoaceticus-baumannii NOT DETECTED NOT DETECTED Final   Bacteroides fragilis NOT DETECTED NOT DETECTED Final   Enterobacterales DETECTED (A) NOT DETECTED Final    Comment: Enterobacterales represent a large order of gram negative bacteria, not a single organism. CRITICAL RESULT CALLED TO, READ BACK BY AND VERIFIED WITH: NATHAN BLUE@0314  06/19/22 RH    Enterobacter cloacae complex NOT DETECTED NOT DETECTED Final   Escherichia coli DETECTED (A) NOT DETECTED Final    Comment: CRITICAL RESULT CALLED TO, READ BACK BY AND VERIFIED WITH: NATHAN BLUE@0314  06/19/22 RH    Klebsiella aerogenes NOT DETECTED NOT DETECTED Final   Klebsiella oxytoca NOT DETECTED NOT DETECTED Final   Klebsiella pneumoniae NOT DETECTED NOT DETECTED Final    Proteus species NOT DETECTED NOT DETECTED Final   Salmonella species NOT DETECTED NOT DETECTED Final   Serratia marcescens NOT DETECTED NOT DETECTED Final   Haemophilus influenzae NOT DETECTED NOT DETECTED Final   Neisseria meningitidis NOT DETECTED NOT DETECTED Final   Pseudomonas aeruginosa NOT DETECTED NOT DETECTED Final   Stenotrophomonas maltophilia NOT DETECTED NOT DETECTED Final   Candida albicans NOT DETECTED NOT DETECTED Final   Candida auris NOT DETECTED NOT DETECTED Final   Candida glabrata NOT DETECTED NOT DETECTED Final   Candida krusei NOT DETECTED NOT  DETECTED Final   Candida parapsilosis NOT DETECTED NOT DETECTED Final   Candida tropicalis NOT DETECTED NOT DETECTED Final   Cryptococcus neoformans/gattii NOT DETECTED NOT DETECTED Final   CTX-M ESBL NOT DETECTED NOT DETECTED Final   Carbapenem resistance IMP NOT DETECTED NOT DETECTED Final   Carbapenem resistance KPC NOT DETECTED NOT DETECTED Final   Carbapenem resistance NDM NOT DETECTED NOT DETECTED Final   Carbapenem resist OXA 48 LIKE NOT DETECTED NOT DETECTED Final   Carbapenem resistance VIM NOT DETECTED NOT DETECTED Final    Comment: Performed at [facility], [address], [place], [place] [address]  Urine Culture     Status: Abnormal   Collection Time: 06/18/22  1:55 PM   Specimen: Urine, Random  Result Value Ref Range Status   Specimen Description   Final    URINE, RANDOM Performed at [facility], [address], [place], [place] [address]    Special Requests   Final    NONE Reflexed from [id number] Performed at [facility], [address] [place], [place] [address]    Culture >=100,000 COLONIES/mL ESCHERICHIA COLI (A)  Final   Report Status 06/21/2022 FINAL  Final   Organism ID, Bacteria ESCHERICHIA COLI (A)  Final      Susceptibility   Escherichia coli - MIC*    AMPICILLIN 4 SENSITIVE Sensitive     CEFAZOLIN <=4 SENSITIVE Sensitive     CEFEPIME <=0.12 SENSITIVE Sensitive      CEFTRIAXONE <=0.25 SENSITIVE Sensitive     CIPROFLOXACIN <=0.25 SENSITIVE Sensitive     GENTAMICIN <=1 SENSITIVE Sensitive     IMIPENEM <=0.25 SENSITIVE Sensitive     NITROFURANTOIN <=16 SENSITIVE Sensitive     TRIMETH/SULFA <=20 SENSITIVE Sensitive     AMPICILLIN/SULBACTAM <=2 SENSITIVE Sensitive     PIP/TAZO <=4 SENSITIVE Sensitive     * >=100,000 COLONIES/mL ESCHERICHIA COLI   Imaging ECHOCARDIOGRAM COMPLETE  Result Date: 06/20/2022    ECHOCARDIOGRAM REPORT   Patient Name:   Caitlin Lin Date of Exam: 06/20/2022 Medical Rec #:  [id number]        Height:       64.0 in Accession #:    [id number]       Weight:       150.0 lb Date of Birth:  Oct 11, 1940         BSA:          1.731 m Patient Age:    82 years         BP:           151/62 mmHg Patient Gender: F                HR:           66 bpm. Exam Location:  [facility] Procedure: 2D Echo, Cardiac Doppler, Color Doppler and Intracardiac            Opacification Agent Indications:    Atrial fibrillation  History:        Patient has prior history of Echocardiogram examinations, most                 recent 01/05/2019. CAD, Aortic Valve Disease,                 Signs/Symptoms:Murmur; Risk Factors:Hypertension and                 Dyslipidemia. CKD.  Sonographer:    Milda Smart Referring Phys: [id number] Debbe Odea  Sonographer Comments:  Technically difficult study due to poor echo windows. Image acquisition challenging due to patient body habitus and Image acquisition challenging due to respiratory motion. IMPRESSIONS  1. Left ventricular ejection fraction, by estimation, is 55 to 60%. The left ventricle has normal function. The left ventricle has no regional wall motion abnormalities. Left ventricular diastolic parameters are consistent with Grade II diastolic dysfunction (pseudonormalization).  2. Right ventricular systolic function is normal. The right ventricular size is normal.  3. The mitral valve is normal in structure. Mild mitral valve  regurgitation.  4. The aortic valve is normal in structure. Aortic valve regurgitation is mild to moderate. FINDINGS  Left Ventricle: Left ventricular ejection fraction, by estimation, is 55 to 60%. The left ventricle has normal function. The left ventricle has no regional wall motion abnormalities. Definity contrast agent was given IV to delineate the left ventricular  endocardial borders. The left ventricular internal cavity size was normal in size. There is borderline left ventricular hypertrophy. Left ventricular diastolic parameters are consistent with Grade II diastolic dysfunction (pseudonormalization). Right Ventricle: The right ventricular size is normal. No increase in right ventricular wall thickness. Right ventricular systolic function is normal. Left Atrium: Left atrial size was normal in size. Right Atrium: Right atrial size was normal in size. Pericardium: There is no evidence of pericardial effusion. Mitral Valve: The mitral valve is normal in structure. Mild mitral valve regurgitation. MV peak gradient, 5.8 mmHg. The mean mitral valve gradient is 3.0 mmHg. Tricuspid Valve: The tricuspid valve is normal in structure. Tricuspid valve regurgitation is mild. Aortic Valve: The aortic valve is normal in structure. Aortic valve regurgitation is mild to moderate. Aortic regurgitation PHT measures 569 msec. Pulmonic Valve: The pulmonic valve was normal in structure. Pulmonic valve regurgitation is not visualized. Aorta: The ascending aorta was not well visualized. IAS/Shunts: No atrial level shunt detected by color flow Doppler.  LEFT VENTRICLE PLAX 2D LVIDd:         4.50 cm      Diastology LVIDs:         3.20 cm      LV e' medial:    8.49 cm/s LV PW:         1.40 cm      LV E/e' medial:  13.5 LV IVS:        0.80 cm      LV e' lateral:   7.62 cm/s LVOT diam:     1.90 cm      LV E/e' lateral: 15.1 LV SV:         59 LV SV Index:   34 LVOT Area:     2.84 cm  LV Volumes (MOD) LV vol d, MOD A4C: 100.0 ml LV vol  s, MOD A4C: 41.4 ml LV SV MOD A4C:     100.0 ml RIGHT VENTRICLE             IVC RV S prime:     11.00 cm/s  IVC diam: 2.50 cm TAPSE (M-mode): 2.2 cm LEFT ATRIUM             Index        RIGHT ATRIUM           Index LA diam:        3.70 cm 2.14 cm/m   RA Area:     14.40 cm LA Vol (A2C):   56.2 ml 32.46 ml/m  RA Volume:   34.40 ml  19.87 ml/m LA Vol (A4C):   41.0 ml  23.68 ml/m LA Biplane Vol: 49.1 ml 28.36 ml/m  AORTIC VALVE LVOT Vmax:   105.00 cm/s LVOT Vmean:  71.900 cm/s LVOT VTI:    0.209 m AI PHT:      569 msec  AORTA Ao Root diam: 3.10 cm Ao Asc diam:  3.70 cm MITRAL VALVE                TRICUSPID VALVE MV Area (PHT): 3.50 cm     TR Peak grad:   23.8 mmHg MV Area VTI:   1.27 cm     TR Mean grad:   18.0 mmHg MV Peak grad:  5.8 mmHg     TR Vmax:        244.00 cm/s MV Mean grad:  3.0 mmHg     TR Vmean:       206.0 cm/s MV Vmax:       1.20 m/s MV Vmean:      75.7 cm/s    SHUNTS MV Decel Time: 217 msec     Systemic VTI:  0.21 m MR Peak grad: 56.6 mmHg     Systemic Diam: 1.90 cm MR Vmax:      376.00 cm/s MV E velocity: 115.00 cm/s MV A velocity: 84.40 cm/s MV E/A ratio:  1.36 Dwayne Salome Arnt MD Electronically signed by Alwyn Pea MD Signature Date/Time: 06/20/2022/12:02:10 PM    Final       Time coordinating discharge: over 30 minutes  SIGNED:  Sunnie Nielsen DO [facility]

## 2022-06-22 ENCOUNTER — Telehealth: Payer: Self-pay | Admitting: *Deleted

## 2022-06-22 NOTE — Telephone Encounter (Signed)
Patient has an appt with Dr Lorin Picket on 4/16 but needs TCM. Thanks.

## 2022-06-22 NOTE — Telephone Encounter (Signed)
Furth, Cadence H, PA-C  P Cv Div Burl Scheduling; P Cv Div Burl Triage Pt needs hosp follow-up in 2-3 weeks. Thanks!

## 2022-06-22 NOTE — Telephone Encounter (Signed)
-----   Message from Cadence David Stall, PA-C sent at 06/21/2022 10:13 AM EDT ----- Regarding: hosp follow-up heart monitor Pt needs a 2 week heart monitor mailed to her for afib

## 2022-06-24 ENCOUNTER — Telehealth: Payer: Self-pay | Admitting: *Deleted

## 2022-06-24 NOTE — Transitions of Care (Post Inpatient/ED Visit) (Signed)
   06/24/2022  Name: Caitlin Lin MRN: [id number] DOB: 1940-07-16  Today's TOC FU Call Status: Today's TOC FU Call Status:: Successful TOC FU Call Competed TOC FU Call Complete Date: 06/24/22  Transition Care Management Follow-up Telephone Call Date of Discharge: 06/21/22 Discharge Facility[facility]) Type of Discharge: Inpatient Admission Primary Inpatient Discharge Diagnosis:: Sepsis How have you been since you were released from the hospital?: Better Any questions or concerns?: No  Items Reviewed: Did you receive and understand the discharge instructions provided?: Yes Medications obtained and verified?: Yes (Medications Reviewed) Any new allergies since your discharge?: No Dietary orders reviewed?: No Do you have support at home?: Yes People in Home: spouse Name of Support/Comfort Primary Source: [facility] and Equipment/Supplies: Were Home Health Services Ordered?: No Any new equipment or medical supplies ordered?: Yes Name of Medical supply agency?: [facility] sent heart monitor Were you able to get the equipment/medical supplies?: No (coming via mail) Do you have any questions related to the use of the equipment/supplies?: No  Functional Questionnaire: Do you need assistance with bathing/showering or dressing?: No Do you need assistance with meal preparation?: No Do you need assistance with eating?: No Do you have difficulty maintaining continence: No Do you need assistance with getting out of bed/getting out of a chair/moving?: No Do you have difficulty managing or taking your medications?: No  Follow up appointments reviewed: PCP Follow-up appointment confirmed?: Yes Date of PCP follow-up appointment?: 06/30/22 Follow-up Provider: Dr Lorin Picket 10:00 Specialist [facility] Follow-up appointment confirmed?: Yes Date of Specialist follow-up appointment?: 07/17/22 Follow-Up Specialty Provider:: Cardiology 9:40 Do you need  transportation to your follow-up appointment?: No Do you understand care options if your condition(s) worsen?: Yes-patient verbalized understanding  SDOH Interventions Today    Flowsheet Row Most Recent Value  SDOH Interventions   Food Insecurity Interventions Intervention Not Indicated  Housing Interventions Intervention Not Indicated  Transportation Interventions Intervention Not Indicated      Interventions Today    Flowsheet Row Most Recent Value  General Interventions   General Interventions Discussed/Reviewed General Interventions Discussed, General Interventions Reviewed, Doctor Visits  Doctor Visits Discussed/Reviewed Doctor Visits Discussed, Doctor Visits Reviewed, Specialist  PCP/Specialist Visits Compliance with follow-up visit      TOC Interventions Today    Flowsheet Row Most Recent Value  TOC Interventions   TOC Interventions Discussed/Reviewed TOC Interventions Discussed, TOC Interventions Reviewed  [RN discussed the heart monitor will be coming by mail within a few days. Patient stated that she had not received yet]        Gean Maidens BSN RN [facility] [phone]

## 2022-06-24 NOTE — Telephone Encounter (Signed)
Patient has appointment with his primary cardiologist on 07/27/22.

## 2022-06-25 ENCOUNTER — Other Ambulatory Visit: Payer: PPO

## 2022-06-30 ENCOUNTER — Encounter: Payer: Self-pay | Admitting: Internal Medicine

## 2022-06-30 ENCOUNTER — Ambulatory Visit (INDEPENDENT_AMBULATORY_CARE_PROVIDER_SITE_OTHER): Payer: HMO | Admitting: Internal Medicine

## 2022-06-30 VITALS — BP 128/70 | HR 66 | Temp 98.2°F | Resp 16 | Ht 64.0 in | Wt 151.2 lb

## 2022-06-30 DIAGNOSIS — E7800 Pure hypercholesterolemia, unspecified: Secondary | ICD-10-CM | POA: Diagnosis not present

## 2022-06-30 DIAGNOSIS — I351 Nonrheumatic aortic (valve) insufficiency: Secondary | ICD-10-CM

## 2022-06-30 DIAGNOSIS — E039 Hypothyroidism, unspecified: Secondary | ICD-10-CM

## 2022-06-30 DIAGNOSIS — I700 Atherosclerosis of aorta: Secondary | ICD-10-CM

## 2022-06-30 DIAGNOSIS — I4891 Unspecified atrial fibrillation: Secondary | ICD-10-CM

## 2022-06-30 DIAGNOSIS — R739 Hyperglycemia, unspecified: Secondary | ICD-10-CM | POA: Diagnosis not present

## 2022-06-30 DIAGNOSIS — D509 Iron deficiency anemia, unspecified: Secondary | ICD-10-CM

## 2022-06-30 DIAGNOSIS — N184 Chronic kidney disease, stage 4 (severe): Secondary | ICD-10-CM | POA: Diagnosis not present

## 2022-06-30 DIAGNOSIS — K219 Gastro-esophageal reflux disease without esophagitis: Secondary | ICD-10-CM | POA: Diagnosis not present

## 2022-06-30 DIAGNOSIS — R350 Frequency of micturition: Secondary | ICD-10-CM | POA: Diagnosis not present

## 2022-06-30 DIAGNOSIS — I10 Essential (primary) hypertension: Secondary | ICD-10-CM | POA: Diagnosis not present

## 2022-06-30 LAB — HEPATIC FUNCTION PANEL
ALT: 23 U/L (ref 0–35)
AST: 24 U/L (ref 0–37)
Albumin: 3.8 g/dL (ref 3.5–5.2)
Alkaline Phosphatase: 98 U/L (ref 39–117)
Bilirubin, Direct: 0.1 mg/dL (ref 0.0–0.3)
Total Bilirubin: 0.5 mg/dL (ref 0.2–1.2)
Total Protein: 6.3 g/dL (ref 6.0–8.3)

## 2022-06-30 LAB — TSH: TSH: 3.62 u[IU]/mL (ref 0.35–5.50)

## 2022-06-30 LAB — CBC WITH DIFFERENTIAL/PLATELET
Basophils Absolute: 0.1 10*3/uL (ref 0.0–0.1)
Basophils Relative: 1 % (ref 0.0–3.0)
Eosinophils Absolute: 0.2 10*3/uL (ref 0.0–0.7)
Eosinophils Relative: 3.2 % (ref 0.0–5.0)
HCT: 33.3 % — ABNORMAL LOW (ref 36.0–46.0)
Hemoglobin: 11.3 g/dL — ABNORMAL LOW (ref 12.0–15.0)
Lymphocytes Relative: 33.7 % (ref 12.0–46.0)
Lymphs Abs: 1.7 10*3/uL (ref 0.7–4.0)
MCHC: 33.8 g/dL (ref 30.0–36.0)
MCV: 91 fl (ref 78.0–100.0)
Monocytes Absolute: 0.6 10*3/uL (ref 0.1–1.0)
Monocytes Relative: 11.3 % (ref 3.0–12.0)
Neutro Abs: 2.5 10*3/uL (ref 1.4–7.7)
Neutrophils Relative %: 50.8 % (ref 43.0–77.0)
Platelets: 252 10*3/uL (ref 150.0–400.0)
RBC: 3.66 Mil/uL — ABNORMAL LOW (ref 3.87–5.11)
RDW: 14 % (ref 11.5–15.5)
WBC: 5 10*3/uL (ref 4.0–10.5)

## 2022-06-30 LAB — BASIC METABOLIC PANEL
BUN: 29 mg/dL — ABNORMAL HIGH (ref 6–23)
CO2: 27 mEq/L (ref 19–32)
Calcium: 9.1 mg/dL (ref 8.4–10.5)
Chloride: 108 mEq/L (ref 96–112)
Creatinine, Ser: 1.59 mg/dL — ABNORMAL HIGH (ref 0.40–1.20)
GFR: 30.35 mL/min — ABNORMAL LOW (ref >60.00)
Glucose, Bld: 92 mg/dL (ref 70–99)
Potassium: 5.2 mEq/L — ABNORMAL HIGH (ref 3.5–5.1)
Sodium: 141 mEq/L (ref 135–145)

## 2022-06-30 LAB — LIPID PANEL
Cholesterol: 139 mg/dL (ref 0–200)
HDL: 38.1 mg/dL — ABNORMAL LOW (ref >39.00)
LDL Cholesterol: 69 mg/dL (ref 0–99)
NonHDL: 100.4
Total CHOL/HDL Ratio: 4
Triglycerides: 155 mg/dL — ABNORMAL HIGH (ref 0.0–149.0)
VLDL: 31 mg/dL (ref 0.0–40.0)

## 2022-06-30 LAB — HEMOGLOBIN A1C: Hgb A1c MFr Bld: 5.6 % (ref 4.6–6.5)

## 2022-06-30 NOTE — Progress Notes (Unsigned)
Subjective:    Patient ID: Caitlin Lin, female    DOB: [birth year], 82 y.o.   MRN: [id number]  Patient here for  Chief Complaint  Patient presents with  . Hospitalization Follow-up    HPI Here for hospital follow up. Recently hospitalized 06/18/22 - 06/21/22 - after presenting with flank pain and increased urinary frequency.  Was found to have new onset afib with RVR (up to 186).  Was given IV cardizem - followed by oral cardizem.  CXR negative.  Dr Kirke Corin consulted.  Found to have UTI - E.coli pan sensitive.  IV abx and discharged on cipro for 10 days total abx.  Not started on anticoagulants.  Zio monitor placed.  Discharged on metoprolol. She is feeling better.  She is eating.  No nausea or vomiting.  No abdominal pain.  Does report increased urinary frequency.  Question if emptying bladder fully.  Discussed further w/up and evaluation.    Past Medical History:  Diagnosis Date  . Cataracts, bilateral   . Chronic kidney disease    Followed by Dr. Cherylann Ratel  . Chronic kidney disease   . Colon polyp   . Constipation due to slow transit   . Diarrhea   . Diarrhea   . GERD (gastroesophageal reflux disease)   . Heart murmur   . History of cyst of breast   . History of hiatal hernia   . Hyperlipidemia   . Hypertension   . Hypothyroidism   . Melanoma of lower leg 2013  . Shingles 2013  . Shingles   . Spinal stenosis in cervical region    Dr. Yves Dill  . Subdural hematoma   . Thyroid disease    Past Surgical History:  Procedure Laterality Date  . ABDOMINAL HYSTERECTOMY  1974  . ANTERIOR AND POSTERIOR VAGINAL REPAIR    . ANTERIOR AND POSTERIOR VAGINAL REPAIR W/ SACROSPINOUS LIGAMENT SUSPENSION Right   . BREAST BIOPSY  1963   Benign per pt's report  . BREAST EXCISIONAL BIOPSY Right 1963   neg  . COLONOSCOPY    . COLONOSCOPY N/A 10/29/2021   Procedure: COLONOSCOPY;  Surgeon: Toledo, Boykin Nearing, MD;  Location: [facility] ENDOSCOPY;  Service: Gastroenterology;  Laterality: N/A;  .  ELECTROMAGNETIC NAVIGATION BROCHOSCOPY Right 01/09/2019   Procedure: ELECTROMAGNETIC NAVIGATION BRONCHOSCOPY;  Surgeon: Salena Saner, MD;  Location: [facility] ORS;  Service: Cardiopulmonary;  Laterality: Right;  . ESOPHAGOGASTRODUODENOSCOPY    . ESOPHAGOGASTRODUODENOSCOPY (EGD) WITH PROPOFOL N/A 11/10/2016   Procedure: ESOPHAGOGASTRODUODENOSCOPY (EGD) WITH PROPOFOL;  Surgeon: Christena Deem, MD;  Location: [facility] ENDOSCOPY;  Service: Endoscopy;  Laterality: N/A;  . ESOPHAGOGASTRODUODENOSCOPY (EGD) WITH PROPOFOL N/A 09/09/2018   Procedure: ESOPHAGOGASTRODUODENOSCOPY (EGD) WITH PROPOFOL;  Surgeon: Christena Deem, MD;  Location: [facility] ENDOSCOPY;  Service: Endoscopy;  Laterality: N/A;  . ESOPHAGOGASTRODUODENOSCOPY (EGD) WITH PROPOFOL N/A 10/27/2019   Procedure: ESOPHAGOGASTRODUODENOSCOPY (EGD) WITH PROPOFOL;  Surgeon: Earline Mayotte, MD;  Location: [facility] ENDOSCOPY;  Service: Endoscopy;  Laterality: N/A;  . ESOPHAGOGASTRODUODENOSCOPY (EGD) WITH PROPOFOL N/A 08/09/2020   Procedure: ESOPHAGOGASTRODUODENOSCOPY (EGD) WITH PROPOFOL;  Surgeon: Regis Bill, MD;  Location: [facility] ENDOSCOPY;  Service: Endoscopy;  Laterality: N/A;  . LUNG BIOPSY  2007   Benign per pt's report  . LUNG BIOPSY Right   . OOPHORECTOMY  1970  . PERINEOPLASTY  12/17/2013  . sling for stress incontinence  12/18/2011   Family History  Problem Relation Age of Onset  . Arthritis Mother   . Liver disease Mother   . Cirrhosis Mother   .  Liver cancer Mother   . Heart disease Father   . Heart attack Father   . Coronary artery disease Father   . Depression Brother   . Prostate cancer Brother   . Diabetes Maternal Aunt   . Breast cancer Cousin        maternal cousins  . Liver cancer Cousin   . Breast cancer Other    Social History   Socioeconomic History  . Marital status: Married    Spouse name: Not on file  . Number of children: 2  . Years of education: Not on file  . Highest education level: Not on file   Occupational History  . Occupation: Retired  Tobacco Use  . Smoking status: Never  . Smokeless tobacco: Never  Vaping Use  . Vaping Use: Never used  Substance and Sexual Activity  . Alcohol use: No  . Drug use: No  . Sexual activity: Yes  Other Topics Concern  . Not on file  Social History Narrative   Lives in [place] with husband. Has 2 children boy and girl, 4 grandchildren. Retired Insurance underwriter.       Daily Caffeine Use:  NO   Regular Exercise -  Not at this time due to back pain, would like to resume soon   Social Determinants of Health   Financial Resource Strain: Low Risk  (11/26/2021)   Overall Financial Resource Strain (CARDIA)   . Difficulty of Paying Living Expenses: Not hard at all  Food Insecurity: No Food Insecurity (06/24/2022)   Hunger Vital Sign   . Worried About Programme researcher, broadcasting/film/video in the Last Year: Never true   . Ran Out of Food in the Last Year: Never true  Transportation Needs: No Transportation Needs (06/24/2022)   PRAPARE - Transportation   . Lack of Transportation (Medical): No   . Lack of Transportation (Non-Medical): No  Physical Activity: Insufficiently Active (11/26/2021)   Exercise Vital Sign   . Days of Exercise per Week: 4 days   . Minutes of Exercise per Session: 20 min  Stress: No Stress Concern Present (11/26/2021)   [employer] of Occupational Health - Occupational Stress Questionnaire   . Feeling of Stress : Not at all  Social Connections: Unknown (11/26/2021)   Social Connection and Isolation Panel [NHANES]   . Frequency of Communication with Friends and Family: Not on file   . Frequency of Social Gatherings with Friends and Family: Not on file   . Attends Religious Services: Not on file   . Active Member of Clubs or Organizations: Not on file   . Attends Banker Meetings: Not on file   . Marital Status: Married     Review of Systems  Constitutional:  Negative for appetite change and fever.  HENT:  Negative for  congestion and sinus pressure.   Respiratory:  Negative for cough, chest tightness and shortness of breath.   Cardiovascular:  Negative for chest pain and palpitations.  Gastrointestinal:  Negative for abdominal pain, diarrhea, nausea and vomiting.  Genitourinary:  Negative for difficulty urinating and dysuria.  Musculoskeletal:  Negative for joint swelling and myalgias.  Skin:  Negative for color change and rash.  Neurological:  Negative for dizziness and headaches.  Psychiatric/Behavioral:  Negative for agitation and dysphoric mood.        Objective:     BP 128/70   Pulse 66   Temp 98.2 F (36.8 C)   Resp 16   Ht 5\' 4"  (1.626 m)  Wt 151 lb 3.2 oz (68.6 kg)   SpO2 99%   BMI 25.95 kg/m  Wt Readings from Last 3 Encounters:  06/30/22 151 lb 3.2 oz (68.6 kg)  06/18/22 150 lb (68 kg)  04/14/22 150 lb (68 kg)    Physical Exam Vitals reviewed.  Constitutional:      General: She is not in acute distress.    Appearance: Normal appearance.  HENT:     Head: Normocephalic and atraumatic.     Right Ear: External ear normal.     Left Ear: External ear normal.  Eyes:     General: No scleral icterus.       Right eye: No discharge.        Left eye: No discharge.     Conjunctiva/sclera: Conjunctivae normal.  Neck:     Thyroid: No thyromegaly.  Cardiovascular:     Rate and Rhythm: Normal rate and regular rhythm.  Pulmonary:     Effort: No respiratory distress.     Breath sounds: Normal breath sounds. No wheezing.  Abdominal:     General: Bowel sounds are normal.     Palpations: Abdomen is soft.     Tenderness: There is no abdominal tenderness.  Musculoskeletal:        General: No swelling or tenderness.     Cervical back: Neck supple. No tenderness.  Lymphadenopathy:     Cervical: No cervical adenopathy.  Skin:    Findings: No erythema or rash.  Neurological:     Mental Status: She is alert.  Psychiatric:        Mood and Affect: Mood normal.        Behavior: Behavior  normal.     Outpatient Encounter Medications as of 06/30/2022  Medication Sig  . Alpha-Lipoic Acid 200 MG TABS Take 200 mg by mouth 3 (three) times daily.  Marland Kitchen amLODipine (NORVASC) 5 MG tablet Take 1 tablet (5 mg total) by mouth daily.  Marland Kitchen aspirin 81 MG tablet Take 1 tablet (81 mg total) by mouth daily.  Marland Kitchen atorvastatin (LIPITOR) 20 MG tablet Take 1 tablet (20 mg total) by mouth daily.  . Cholecalciferol 25 MCG (1000 UT) tablet Take 1,000 Units by mouth daily.  Tery Sanfilippo Calcium (STOOL SOFTENER PO) Take by mouth.  . fluticasone (FLONASE) 50 MCG/ACT nasal spray   . Glucosamine-Chondroit-Vit C-Mn (GLUCOSAMINE CHONDR 500 COMPLEX PO) Take 2 tablets by mouth daily.  Marland Kitchen levothyroxine (SYNTHROID) 75 MCG tablet TAKE 1 TABLET BY MOUTH ONCE DAILY IN THE MORNING BEFORE BREAKFAST  . metoprolol tartrate (LOPRESSOR) 25 MG tablet Take 0.5 tablets (12.5 mg total) by mouth 2 (two) times daily.  . Multiple Vitamin (MULTIVITAMIN) tablet Take 1 tablet by mouth daily.  . Omega-3 Fatty Acids (FISH OIL) 1000 MG CAPS Take 1,000 mg by mouth daily.  . pantoprazole (PROTONIX) 40 MG tablet Take 1 tablet (40 mg total) by mouth daily. Take 30 minutes before breakfast  . Polyethylene Glycol 3350 (MIRALAX PO) Take by mouth as needed.  . Probiotic Product (PROBIOTIC DAILY PO) Take by mouth daily.  Marland Kitchen telmisartan (MICARDIS) 20 MG tablet Take 1 tablet by mouth daily.  . Wheat Dextrin (BENEFIBER PO) Take by mouth.   No facility-administered encounter medications on file as of 06/30/2022.     Lab Results  Component Value Date   WBC 5.0 06/30/2022   HGB 11.3 (L) 06/30/2022   HCT 33.3 (L) 06/30/2022   PLT 252.0 06/30/2022   GLUCOSE 92 06/30/2022   CHOL 139 06/30/2022  TRIG 155.0 (H) 06/30/2022   HDL 38.10 (L) 06/30/2022   LDLDIRECT 80.0 01/05/2022   LDLCALC 69 06/30/2022   ALT 23 06/30/2022   AST 24 06/30/2022   NA 141 06/30/2022   K 5.2 (H) 06/30/2022   CL 108 06/30/2022   CREATININE 1.59 (H) 06/30/2022   BUN 29  (H) 06/30/2022   CO2 27 06/30/2022   TSH 3.62 06/30/2022   INR 1.1 06/18/2022   HGBA1C 5.6 06/30/2022   MICROALBUR 2.1 (H) 03/27/2015    ECHOCARDIOGRAM COMPLETE  Result Date: 06/20/2022    ECHOCARDIOGRAM REPORT   Patient Name:   Caitlin Lin Date of Exam: 06/20/2022 Medical Rec #:  [id number]        Height:       64.0 in Accession #:    [id number]       Weight:       150.0 lb Date of Birth:  May 31, 1940         BSA:          1.731 m Patient Age:    82 years         BP:           151/62 mmHg Patient Gender: F                HR:           66 bpm. Exam Location:  [facility] Procedure: 2D Echo, Cardiac Doppler, Color Doppler and Intracardiac            Opacification Agent Indications:    Atrial fibrillation  History:        Patient has prior history of Echocardiogram examinations, most                 recent 01/05/2019. CAD, Aortic Valve Disease,                 Signs/Symptoms:Murmur; Risk Factors:Hypertension and                 Dyslipidemia. CKD.  Sonographer:    Milda Smart Referring Phys: [id number] BRIAN AGBOR-ETANG  Sonographer Comments: Technically difficult study due to poor echo windows. Image acquisition challenging due to patient body habitus and Image acquisition challenging due to respiratory motion. IMPRESSIONS  1. Left ventricular ejection fraction, by estimation, is 55 to 60%. The left ventricle has normal function. The left ventricle has no regional wall motion abnormalities. Left ventricular diastolic parameters are consistent with Grade II diastolic dysfunction (pseudonormalization).  2. Right ventricular systolic function is normal. The right ventricular size is normal.  3. The mitral valve is normal in structure. Mild mitral valve regurgitation.  4. The aortic valve is normal in structure. Aortic valve regurgitation is mild to moderate. FINDINGS  Left Ventricle: Left ventricular ejection fraction, by estimation, is 55 to 60%. The left ventricle has normal function. The left ventricle has no  regional wall motion abnormalities. Definity contrast agent was given IV to delineate the left ventricular  endocardial borders. The left ventricular internal cavity size was normal in size. There is borderline left ventricular hypertrophy. Left ventricular diastolic parameters are consistent with Grade II diastolic dysfunction (pseudonormalization). Right Ventricle: The right ventricular size is normal. No increase in right ventricular wall thickness. Right ventricular systolic function is normal. Left Atrium: Left atrial size was normal in size. Right Atrium: Right atrial size was normal in size. Pericardium: There is no evidence of pericardial effusion. Mitral Valve: The mitral valve is normal in structure. Mild mitral valve  regurgitation. MV peak gradient, 5.8 mmHg. The mean mitral valve gradient is 3.0 mmHg. Tricuspid Valve: The tricuspid valve is normal in structure. Tricuspid valve regurgitation is mild. Aortic Valve: The aortic valve is normal in structure. Aortic valve regurgitation is mild to moderate. Aortic regurgitation PHT measures 569 msec. Pulmonic Valve: The pulmonic valve was normal in structure. Pulmonic valve regurgitation is not visualized. Aorta: The ascending aorta was not well visualized. IAS/Shunts: No atrial level shunt detected by color flow Doppler.  LEFT VENTRICLE PLAX 2D LVIDd:         4.50 cm      Diastology LVIDs:         3.20 cm      LV e' medial:    8.49 cm/s LV PW:         1.40 cm      LV E/e' medial:  13.5 LV IVS:        0.80 cm      LV e' lateral:   7.62 cm/s LVOT diam:     1.90 cm      LV E/e' lateral: 15.1 LV SV:         59 LV SV Index:   34 LVOT Area:     2.84 cm  LV Volumes (MOD) LV vol d, MOD A4C: 100.0 ml LV vol s, MOD A4C: 41.4 ml LV SV MOD A4C:     100.0 ml RIGHT VENTRICLE             IVC RV S prime:     11.00 cm/s  IVC diam: 2.50 cm TAPSE (M-mode): 2.2 cm LEFT ATRIUM             Index        RIGHT ATRIUM           Index LA diam:        3.70 cm 2.14 cm/m   RA Area:      14.40 cm LA Vol (A2C):   56.2 ml 32.46 ml/m  RA Volume:   34.40 ml  19.87 ml/m LA Vol (A4C):   41.0 ml 23.68 ml/m LA Biplane Vol: 49.1 ml 28.36 ml/m  AORTIC VALVE LVOT Vmax:   105.00 cm/s LVOT Vmean:  71.900 cm/s LVOT VTI:    0.209 m AI PHT:      569 msec  AORTA Ao Root diam: 3.10 cm Ao Asc diam:  3.70 cm MITRAL VALVE                TRICUSPID VALVE MV Area (PHT): 3.50 cm     TR Peak grad:   23.8 mmHg MV Area VTI:   1.27 cm     TR Mean grad:   18.0 mmHg MV Peak grad:  5.8 mmHg     TR Vmax:        244.00 cm/s MV Mean grad:  3.0 mmHg     TR Vmean:       206.0 cm/s MV Vmax:       1.20 m/s MV Vmean:      75.7 cm/s    SHUNTS MV Decel Time: 217 msec     Systemic VTI:  0.21 m MR Peak grad: 56.6 mmHg     Systemic Diam: 1.90 cm MR Vmax:      376.00 cm/s MV E velocity: 115.00 cm/s MV A velocity: 84.40 cm/s MV E/A ratio:  1.36 Dwayne Salome Arnt MD Electronically signed by Alwyn Pea MD Signature Date/Time: 06/20/2022/12:02:10 PM  Final        Assessment & Plan:  Iron deficiency anemia, unspecified iron deficiency anemia type  Primary hypertension  Hypercholesterolemia -     TSH -     CBC with Differential/Platelet -     Hepatic function panel -     Basic metabolic panel -     Lipid panel  Hyperglycemia -     Hemoglobin A1c  Urinary frequency -     Ambulatory referral to Urology     Dale [place], MD

## 2022-07-01 ENCOUNTER — Encounter: Payer: Self-pay | Admitting: Internal Medicine

## 2022-07-01 ENCOUNTER — Telehealth: Payer: Self-pay | Admitting: Cardiology

## 2022-07-01 DIAGNOSIS — I4891 Unspecified atrial fibrillation: Secondary | ICD-10-CM

## 2022-07-01 NOTE — Assessment & Plan Note (Signed)
Follow cbc and iron studies.  

## 2022-07-01 NOTE — Telephone Encounter (Signed)
Patient states she hasn't received hr heart montior. Calling to let our office know. Please advise

## 2022-07-02 ENCOUNTER — Ambulatory Visit: Payer: HMO | Attending: Cardiology

## 2022-07-02 ENCOUNTER — Other Ambulatory Visit: Payer: Self-pay | Admitting: Internal Medicine

## 2022-07-02 DIAGNOSIS — I4891 Unspecified atrial fibrillation: Secondary | ICD-10-CM | POA: Diagnosis not present

## 2022-07-02 DIAGNOSIS — E875 Hyperkalemia: Secondary | ICD-10-CM

## 2022-07-02 DIAGNOSIS — D649 Anemia, unspecified: Secondary | ICD-10-CM

## 2022-07-02 NOTE — Assessment & Plan Note (Signed)
Low carb diet and exercise.  Follow met b and a1c.   

## 2022-07-02 NOTE — Telephone Encounter (Signed)
Order has been placed.

## 2022-07-02 NOTE — Assessment & Plan Note (Signed)
Continue lipitor  ?

## 2022-07-02 NOTE — Assessment & Plan Note (Signed)
Followed by nephrology -  by Dr Murphy.  Evaluated 05/2021.  Losartan decreased to 50mg - 1/2 table q day.  Follow pressures.  Stable.  Continue to avoid antiinflammatories. Continue losartan.  

## 2022-07-02 NOTE — Assessment & Plan Note (Addendum)
Recently evaluated in ER (and admitted) after presenting with flank pain and increased urinary frequency.  Was found to have new onset afib with RVR (up to 186).  Was given IV cardizem - followed by oral cardizem.  CXR negative.  Dr Kirke Corin consulted.  Found to have UTI - E.coli pan sensitive.  IV abx and discharged on cipro for 10 days total abx.  She is feeling better.  Still with issues of increased urinary frequency and questioning if emptying bladder.  Discussed.  Request referral to urology.  Recheck met b and cbc today.

## 2022-07-02 NOTE — Assessment & Plan Note (Signed)
Was found to have new onset afib with RVR (up to 186) - during recent hospitalization. Was given IV cardizem - followed by oral cardizem.  CXR negative.  Dr Kirke Corin consulted. Found to have UTI - E.coli pan sensitive.  IV abx and discharged on cipro for 10 days total abx.  Not started on anticoagulants.  Was felt afib - related to acute illness.  Zio monitor placed.  Discharged on metoprolol. She is feeling better. Has f/u with cardiology planned.

## 2022-07-02 NOTE — Assessment & Plan Note (Signed)
On thyroid replacement.  Follow tsh.  

## 2022-07-02 NOTE — Assessment & Plan Note (Signed)
Blood pressure as outlined. On amlodipine and losartan.  Follow pressures.  Follow metabolic panel. Spot check pressures and send in readings.  

## 2022-07-02 NOTE — Progress Notes (Signed)
Order placed for met b recheck and cbc, iron studies and B12.

## 2022-07-02 NOTE — Assessment & Plan Note (Addendum)
Continues protonix in am.  Follow.

## 2022-07-02 NOTE — Assessment & Plan Note (Signed)
Continue lipitor.  Low cholesterol diet and exercise.  Follow lipid panel and liver function tests.   

## 2022-07-02 NOTE — Assessment & Plan Note (Signed)
ECHO - aortic regurgitation.  Saw cardiology.  Coronary artery calcifications on noncontrast chest CT. Previous echocardiogram with normal systolic and diastolic function.  Lexiscan Myoview 03/2020 with no significant ischemia, low risk.  Continue aspirin, statin  

## 2022-07-03 ENCOUNTER — Encounter: Payer: Self-pay | Admitting: *Deleted

## 2022-07-09 ENCOUNTER — Other Ambulatory Visit (INDEPENDENT_AMBULATORY_CARE_PROVIDER_SITE_OTHER): Payer: HMO

## 2022-07-09 DIAGNOSIS — E875 Hyperkalemia: Secondary | ICD-10-CM | POA: Diagnosis not present

## 2022-07-09 LAB — BASIC METABOLIC PANEL
BUN: 26 mg/dL — ABNORMAL HIGH (ref 6–23)
CO2: 26 mEq/L (ref 19–32)
Calcium: 9.4 mg/dL (ref 8.4–10.5)
Chloride: 107 mEq/L (ref 96–112)
Creatinine, Ser: 1.62 mg/dL — ABNORMAL HIGH (ref 0.40–1.20)
GFR: 29.67 mL/min — ABNORMAL LOW (ref >60.00)
Glucose, Bld: 97 mg/dL (ref 70–99)
Potassium: 5.4 mEq/L — ABNORMAL HIGH (ref 3.5–5.1)
Sodium: 138 mEq/L (ref 135–145)

## 2022-07-10 ENCOUNTER — Encounter: Payer: Self-pay | Admitting: *Deleted

## 2022-07-10 ENCOUNTER — Other Ambulatory Visit: Payer: Self-pay | Admitting: *Deleted

## 2022-07-10 DIAGNOSIS — E875 Hyperkalemia: Secondary | ICD-10-CM

## 2022-07-13 ENCOUNTER — Other Ambulatory Visit
Admission: RE | Admit: 2022-07-13 | Discharge: 2022-07-13 | Disposition: A | Discharge status: Home / Self Care | Payer: HMO | Attending: Internal Medicine | Admitting: Internal Medicine

## 2022-07-13 DIAGNOSIS — D649 Anemia, unspecified: Secondary | ICD-10-CM

## 2022-07-13 LAB — CBC WITH DIFFERENTIAL/PLATELET
Abs Immature Granulocytes: 0.01 10*3/uL (ref 0.00–0.07)
Basophils Absolute: 0 10*3/uL (ref 0.0–0.1)
Basophils Relative: 1 %
Eosinophils Absolute: 0.1 10*3/uL (ref 0.0–0.5)
Eosinophils Relative: 4 %
HCT: 36.2 % (ref 36.0–46.0)
Hemoglobin: 11.8 g/dL — ABNORMAL LOW (ref 12.0–15.0)
Immature Granulocytes: 0 %
Lymphocytes Relative: 41 %
Lymphs Abs: 1.5 10*3/uL (ref 0.7–4.0)
MCH: 29.9 pg (ref 26.0–34.0)
MCHC: 32.6 g/dL (ref 30.0–36.0)
MCV: 91.9 fL (ref 80.0–100.0)
Monocytes Absolute: 0.6 10*3/uL (ref 0.1–1.0)
Monocytes Relative: 17 %
Neutro Abs: 1.3 10*3/uL — ABNORMAL LOW (ref 1.7–7.7)
Neutrophils Relative %: 37 %
Platelets: 147 10*3/uL — ABNORMAL LOW (ref 150–400)
RBC: 3.94 MIL/uL (ref 3.87–5.11)
RDW: 13.2 % (ref 11.5–15.5)
WBC: 3.7 10*3/uL — ABNORMAL LOW (ref 4.0–10.5)
nRBC: 0 % (ref 0.0–0.2)

## 2022-07-13 LAB — IRON AND TIBC
Iron: 86 ug/dL (ref 28–170)
Saturation Ratios: 29 % (ref 10.4–31.8)
TIBC: 293 ug/dL (ref 250–450)
UIBC: 207 ug/dL

## 2022-07-13 LAB — VITAMIN B12: Vitamin B-12: 867 pg/mL (ref 180–914)

## 2022-07-13 LAB — FERRITIN: Ferritin: 75 ng/mL (ref 11–307)

## 2022-07-13 NOTE — Addendum Note (Signed)
Addended by: Mathayus Stanbery C on: 07/13/2022 12:05 PM   Modules accepted: Orders  

## 2022-07-13 NOTE — Addendum Note (Signed)
Addended by: Lonell Face C on: 07/13/2022 12:05 PM   Modules accepted: Orders

## 2022-07-14 ENCOUNTER — Telehealth: Payer: Self-pay

## 2022-07-14 DIAGNOSIS — D649 Anemia, unspecified: Secondary | ICD-10-CM

## 2022-07-14 DIAGNOSIS — E875 Hyperkalemia: Secondary | ICD-10-CM

## 2022-07-14 NOTE — Telephone Encounter (Signed)
-----   Message from Dale Finland, MD sent at 07/13/2022 10:13 PM EDT ----- Please call and notify hgb has improved from recent checks.  B12 level ok.  Iron studies ok.  White blood cell count and platelet count slightly decreased.  Need to follow.  Recheck cbc and ferritin in 4 weeks to confirm stable.

## 2022-07-15 ENCOUNTER — Other Ambulatory Visit
Admission: RE | Admit: 2022-07-15 | Discharge: 2022-07-15 | Disposition: A | Discharge status: Home / Self Care | Payer: HMO | Attending: Internal Medicine | Admitting: Internal Medicine

## 2022-07-15 DIAGNOSIS — E875 Hyperkalemia: Secondary | ICD-10-CM | POA: Insufficient documentation

## 2022-07-15 DIAGNOSIS — D649 Anemia, unspecified: Secondary | ICD-10-CM | POA: Diagnosis not present

## 2022-07-15 LAB — CBC WITH DIFFERENTIAL/PLATELET
Abs Immature Granulocytes: 0.01 10*3/uL (ref 0.00–0.07)
Basophils Absolute: 0 10*3/uL (ref 0.0–0.1)
Basophils Relative: 1 %
Eosinophils Absolute: 0.1 10*3/uL (ref 0.0–0.5)
Eosinophils Relative: 3 %
HCT: 35.3 % — ABNORMAL LOW (ref 36.0–46.0)
Hemoglobin: 11.6 g/dL — ABNORMAL LOW (ref 12.0–15.0)
Immature Granulocytes: 0 %
Lymphocytes Relative: 41 %
Lymphs Abs: 1.9 10*3/uL (ref 0.7–4.0)
MCH: 29.7 pg (ref 26.0–34.0)
MCHC: 32.9 g/dL (ref 30.0–36.0)
MCV: 90.5 fL (ref 80.0–100.0)
Monocytes Absolute: 0.7 10*3/uL (ref 0.1–1.0)
Monocytes Relative: 15 %
Neutro Abs: 1.8 10*3/uL (ref 1.7–7.7)
Neutrophils Relative %: 40 %
Platelets: 141 10*3/uL — ABNORMAL LOW (ref 150–400)
RBC: 3.9 MIL/uL (ref 3.87–5.11)
RDW: 13.2 % (ref 11.5–15.5)
WBC: 4.6 10*3/uL (ref 4.0–10.5)
nRBC: 0 % (ref 0.0–0.2)

## 2022-07-15 LAB — FERRITIN: Ferritin: 72 ng/mL (ref 11–307)

## 2022-07-15 LAB — IRON AND TIBC
Iron: 91 ug/dL (ref 28–170)
Saturation Ratios: 31 % (ref 10.4–31.8)
TIBC: 297 ug/dL (ref 250–450)
UIBC: 206 ug/dL

## 2022-07-15 LAB — POTASSIUM: Potassium: 4.7 mmol/L (ref 3.5–5.1)

## 2022-07-15 NOTE — Telephone Encounter (Signed)
Patient returned our call.  I read message to patient.  [facility].

## 2022-07-15 NOTE — Telephone Encounter (Signed)
Per chart review, potassium lab was not drawn. Reordered stat for her to [facility]. Called patient to let her know but was unable to leave message.

## 2022-07-15 NOTE — Addendum Note (Signed)
Addended by: Rita Ohara D on: 07/15/2022 12:19 PM   Modules accepted: Orders

## 2022-07-15 NOTE — Telephone Encounter (Signed)
Patient returned office phone call and note was read. Patient stated she already has a lab scheduled for 07/31/2022. Patient also would like to know about her potassium results.

## 2022-07-15 NOTE — Addendum Note (Signed)
Addended by: Bennetta Rudden C on: 07/15/2022 12:56 PM   Modules accepted: Orders  

## 2022-07-15 NOTE — Addendum Note (Signed)
Addended by: Lonell Face C on: 07/15/2022 12:56 PM   Modules accepted: Orders

## 2022-07-15 NOTE — Telephone Encounter (Signed)
Noted  

## 2022-07-16 DIAGNOSIS — M47896 Other spondylosis, lumbar region: Secondary | ICD-10-CM | POA: Diagnosis not present

## 2022-07-16 DIAGNOSIS — M4316 Spondylolisthesis, lumbar region: Secondary | ICD-10-CM | POA: Diagnosis not present

## 2022-07-17 ENCOUNTER — Ambulatory Visit: Payer: HMO | Admitting: Cardiology

## 2022-07-22 ENCOUNTER — Telehealth: Payer: Self-pay | Admitting: Internal Medicine

## 2022-07-22 DIAGNOSIS — I4891 Unspecified atrial fibrillation: Secondary | ICD-10-CM | POA: Diagnosis not present

## 2022-07-22 DIAGNOSIS — R319 Hematuria, unspecified: Secondary | ICD-10-CM | POA: Diagnosis not present

## 2022-07-22 DIAGNOSIS — N390 Urinary tract infection, site not specified: Secondary | ICD-10-CM | POA: Diagnosis not present

## 2022-07-22 DIAGNOSIS — R350 Frequency of micturition: Secondary | ICD-10-CM | POA: Diagnosis not present

## 2022-07-22 NOTE — Telephone Encounter (Signed)
Patient called and said no one ever called her about her labs that were done at the hospital on 07/15/2022. Patient stated that Dr Lorin Picket had ordered labs. Please call patient on her cell phone.

## 2022-07-23 NOTE — Telephone Encounter (Signed)
Noted  

## 2022-07-23 NOTE — Telephone Encounter (Signed)
See result note.  

## 2022-07-30 ENCOUNTER — Other Ambulatory Visit: Payer: HMO

## 2022-07-31 ENCOUNTER — Other Ambulatory Visit (INDEPENDENT_AMBULATORY_CARE_PROVIDER_SITE_OTHER): Payer: HMO

## 2022-07-31 DIAGNOSIS — D649 Anemia, unspecified: Secondary | ICD-10-CM | POA: Diagnosis not present

## 2022-07-31 LAB — CBC WITH DIFFERENTIAL/PLATELET
Basophils Absolute: 0 10*3/uL (ref 0.0–0.1)
Basophils Relative: 0.9 % (ref 0.0–3.0)
Eosinophils Absolute: 0.1 10*3/uL (ref 0.0–0.7)
Eosinophils Relative: 2.9 % (ref 0.0–5.0)
HCT: 38 % (ref 36.0–46.0)
Hemoglobin: 12.7 g/dL (ref 12.0–15.0)
Lymphocytes Relative: 41.1 % (ref 12.0–46.0)
Lymphs Abs: 1.8 10*3/uL (ref 0.7–4.0)
MCHC: 33.3 g/dL (ref 30.0–36.0)
MCV: 90.2 fl (ref 78.0–100.0)
Monocytes Absolute: 0.6 10*3/uL (ref 0.1–1.0)
Monocytes Relative: 14.6 % — ABNORMAL HIGH (ref 3.0–12.0)
Neutro Abs: 1.8 10*3/uL (ref 1.4–7.7)
Neutrophils Relative %: 40.5 % — ABNORMAL LOW (ref 43.0–77.0)
Platelets: 219 10*3/uL (ref 150.0–400.0)
RBC: 4.21 Mil/uL (ref 3.87–5.11)
RDW: 13 % (ref 11.5–15.5)
WBC: 4.4 10*3/uL (ref 4.0–10.5)

## 2022-07-31 LAB — IBC + FERRITIN
Ferritin: 89.2 ng/mL (ref 10.0–291.0)
Iron: 89 ug/dL (ref 42–145)
Saturation Ratios: 35.5 % (ref 20.0–50.0)
TIBC: 250.6 ug/dL (ref 250.0–450.0)
Transferrin: 179 mg/dL — ABNORMAL LOW (ref 212.0–360.0)

## 2022-08-07 ENCOUNTER — Other Ambulatory Visit: Payer: Self-pay | Admitting: Internal Medicine

## 2022-08-21 ENCOUNTER — Ambulatory Visit: Payer: HMO | Attending: Cardiology | Admitting: Cardiology

## 2022-08-21 ENCOUNTER — Encounter: Payer: Self-pay | Admitting: Cardiology

## 2022-08-21 VITALS — BP 132/70 | HR 69 | Ht 64.0 in | Wt 149.6 lb

## 2022-08-21 DIAGNOSIS — I10 Essential (primary) hypertension: Secondary | ICD-10-CM | POA: Diagnosis not present

## 2022-08-21 DIAGNOSIS — E7800 Pure hypercholesterolemia, unspecified: Secondary | ICD-10-CM | POA: Diagnosis not present

## 2022-08-21 DIAGNOSIS — I2510 Atherosclerotic heart disease of native coronary artery without angina pectoris: Secondary | ICD-10-CM

## 2022-08-21 DIAGNOSIS — I4891 Unspecified atrial fibrillation: Secondary | ICD-10-CM | POA: Diagnosis not present

## 2022-08-21 DIAGNOSIS — I2584 Coronary atherosclerosis due to calcified coronary lesion: Secondary | ICD-10-CM | POA: Diagnosis not present

## 2022-08-21 NOTE — Progress Notes (Signed)
Cardiology Office Note:    Date:  08/21/2022   ID:  Caitlin Lin, DOB [birth year], MRN [id number]  PCP:  Dale Scurry, MD  [facility] Cardiologist:  Debbe Odea, MD  [facility] Electrophysiologist:  None   Referring MD: Dale , MD   Chief Complaint  Patient presents with   Follow-up    Patient denies new or acute cardiac problems/concerns today.  Did take the Metoprolol bid as prescribed for 1 week but feel drained and weak so decreased to QD that was tolerated well.  Has been off Metoprolol for about 3 weeks because she was wanting to speak with MD prior to refilling.       History of Present Illness:    Caitlin Lin is a 82 y.o. female with a hx of coronary calcifications, hypertension, hyperlipidemia, lone atrial fibrillation in the context of UTI, mild aortic regurgitation, who presents for follow-up.   Recently seen in the hospital 2 months ago, admitted with UTI induced sepsis, seen for A-fib RVR.  Started on Lopressor with spontaneous conversion.  Cardiac monitor was placed to evaluate any A-fib recurrence, no further arrhythmias noted.  To Lopressor for about 2 weeks, discussed fatigue and she stopped.  Has felt well since.  Had another episode of UTI symptoms, presented to urgent care and was treated with antibiotics.  Has appointment with urology to evaluate any structural causes for frequent UTIs.  Prior notes Echo 12/2018 EF 60 to 65% Myoview 03/2020 low risk scan, no evidence for ischemia Chest CT without contrast 08/2019, coronary artery calcifications.  Past Medical History:  Diagnosis Date   Cataracts, bilateral    Chronic kidney disease    Followed by Dr. Cherylann Ratel   Chronic kidney disease    Colon polyp    Constipation due to slow transit    Diarrhea    Diarrhea    GERD (gastroesophageal reflux disease)    Heart murmur    History of cyst of breast    History of hiatal hernia    Hyperlipidemia    Hypertension    Hypothyroidism     Melanoma of lower leg (HCC) 2013   Shingles 2013   Shingles    Spinal stenosis in cervical region    Dr. Yves Dill   Subdural hematoma [facility])    Thyroid disease     Past Surgical History:  Procedure Laterality Date   ABDOMINAL HYSTERECTOMY  1974   ANTERIOR AND POSTERIOR VAGINAL REPAIR     ANTERIOR AND POSTERIOR VAGINAL REPAIR W/ SACROSPINOUS LIGAMENT SUSPENSION Right    BREAST BIOPSY  1963   Benign per pt's report   BREAST EXCISIONAL BIOPSY Right 1963   neg   COLONOSCOPY     COLONOSCOPY N/A 10/29/2021   Procedure: COLONOSCOPY;  Surgeon: Toledo, Boykin Nearing, MD;  Location: [facility] ENDOSCOPY;  Service: Gastroenterology;  Laterality: N/A;   ELECTROMAGNETIC NAVIGATION BROCHOSCOPY Right 01/09/2019   Procedure: ELECTROMAGNETIC NAVIGATION BRONCHOSCOPY;  Surgeon: Salena Saner, MD;  Location: [facility] ORS;  Service: Cardiopulmonary;  Laterality: Right;   ESOPHAGOGASTRODUODENOSCOPY     ESOPHAGOGASTRODUODENOSCOPY (EGD) WITH PROPOFOL N/A 11/10/2016   Procedure: ESOPHAGOGASTRODUODENOSCOPY (EGD) WITH PROPOFOL;  Surgeon: Christena Deem, MD;  Location: [facility] ENDOSCOPY;  Service: Endoscopy;  Laterality: N/A;   ESOPHAGOGASTRODUODENOSCOPY (EGD) WITH PROPOFOL N/A 09/09/2018   Procedure: ESOPHAGOGASTRODUODENOSCOPY (EGD) WITH PROPOFOL;  Surgeon: Christena Deem, MD;  Location: [facility] ENDOSCOPY;  Service: Endoscopy;  Laterality: N/A;   ESOPHAGOGASTRODUODENOSCOPY (EGD) WITH PROPOFOL N/A 10/27/2019   Procedure: ESOPHAGOGASTRODUODENOSCOPY (EGD) WITH PROPOFOL;  Surgeon: Earline Mayotte, MD;  Location: [facility] ENDOSCOPY;  Service: Endoscopy;  Laterality: N/A;   ESOPHAGOGASTRODUODENOSCOPY (EGD) WITH PROPOFOL N/A 08/09/2020   Procedure: ESOPHAGOGASTRODUODENOSCOPY (EGD) WITH PROPOFOL;  Surgeon: Regis Bill, MD;  Location: [facility] ENDOSCOPY;  Service: Endoscopy;  Laterality: N/A;   LUNG BIOPSY  2007   Benign per pt's report   LUNG BIOPSY Right    OOPHORECTOMY  1970   PERINEOPLASTY  12/17/2013   sling for  stress incontinence  12/18/2011    Current Medications: Current Meds  Medication Sig   Alpha-Lipoic Acid 200 MG TABS Take 200 mg by mouth 3 (three) times daily.   amLODipine (NORVASC) 5 MG tablet Take 1 tablet (5 mg total) by mouth daily.   aspirin 81 MG tablet Take 1 tablet (81 mg total) by mouth daily.   atorvastatin (LIPITOR) 20 MG tablet Take 1 tablet (20 mg total) by mouth daily.   Cholecalciferol 25 MCG (1000 UT) tablet Take 1,000 Units by mouth daily.   Docusate Calcium (STOOL SOFTENER PO) Take by mouth.   Glucosamine-Chondroit-Vit C-Mn (GLUCOSAMINE CHONDR 500 COMPLEX PO) Take 2 tablets by mouth daily.   levothyroxine (SYNTHROID) 75 MCG tablet TAKE 1 TABLET BY MOUTH ONCE DAILY IN THE MORNING BEFORE BREAKFAST   Multiple Vitamin (MULTIVITAMIN) tablet Take 1 tablet by mouth daily.   Omega-3 Fatty Acids (FISH OIL) 1000 MG CAPS Take 1,000 mg by mouth daily.   pantoprazole (PROTONIX) 40 MG tablet Take 1 tablet (40 mg total) by mouth daily. Take 30 minutes before breakfast   Polyethylene Glycol 3350 (MIRALAX PO) Take by mouth as needed.   Probiotic Product (PROBIOTIC DAILY PO) Take by mouth daily.   telmisartan (MICARDIS) 20 MG tablet Take 1 tablet by mouth daily.   Wheat Dextrin (BENEFIBER PO) Take by mouth.     Allergies:   Quinapril and Codeine   Social History   Socioeconomic History   Marital status: Married    Spouse name: Not on file   Number of children: 2   Years of education: Not on file   Highest education level: Not on file  Occupational History   Occupation: Retired  Tobacco Use   Smoking status: Never   Smokeless tobacco: Never  Vaping Use   Vaping Use: Never used  Substance and Sexual Activity   Alcohol use: No   Drug use: No   Sexual activity: Yes  Other Topics Concern   Not on file  Social History Narrative   Lives in [place] with husband. Has 2 children boy and girl, 4 grandchildren. Retired Insurance underwriter.       Daily Caffeine Use:  NO   Regular  Exercise -  Not at this time due to back pain, would like to resume soon   Social Determinants of Health   Financial Resource Strain: Low Risk  (11/26/2021)   Overall Financial Resource Strain (CARDIA)    Difficulty of Paying Living Expenses: Not hard at all  Food Insecurity: No Food Insecurity (06/24/2022)   Hunger Vital Sign    Worried About Running Out of Food in the Last Year: Never true    Ran Out of Food in the Last Year: Never true  Transportation Needs: No Transportation Needs (06/24/2022)   PRAPARE - Administrator, Civil Service (Medical): No    Lack of Transportation (Non-Medical): No  Physical Activity: Insufficiently Active (11/26/2021)   Exercise Vital Sign    Days of Exercise per Week: 4 days    Minutes of  Exercise per Session: 20 min  Stress: No Stress Concern Present (11/26/2021)   [employer] of Occupational Health - Occupational Stress Questionnaire    Feeling of Stress : Not at all  Social Connections: Unknown (11/26/2021)   Social Connection and Isolation Panel [NHANES]    Frequency of Communication with Friends and Family: Not on file    Frequency of Social Gatherings with Friends and Family: Not on file    Attends Religious Services: Not on file    Active Member of Clubs or Organizations: Not on file    Attends Banker Meetings: Not on file    Marital Status: Married     Family History: The patient's family history includes Arthritis in her mother; Breast cancer in her cousin and another family member; Cirrhosis in her mother; Coronary artery disease in her father; Depression in her brother; Diabetes in her maternal aunt; Heart attack in her father; Heart disease in her father; Liver cancer in her cousin and mother; Liver disease in her mother; Prostate cancer in her brother.  ROS:   Please see the history of present illness.     All other systems reviewed and are negative.  EKGs/Labs/Other Studies Reviewed:    The following  studies were reviewed today:   EKG:  EKG is  ordered today.  The ekg ordered today demonstrates sinus rhythm  Recent Labs: 06/18/2022: Magnesium 1.7 06/30/2022: ALT 23; TSH 3.62 07/09/2022: BUN 26; Creatinine, Ser 1.62; Sodium 138 07/15/2022: Potassium 4.7 07/31/2022: Hemoglobin 12.7; Platelets 219.0  Recent Lipid Panel    Component Value Date/Time   CHOL 139 06/30/2022 1101   TRIG 155.0 (H) 06/30/2022 1101   HDL 38.10 (L) 06/30/2022 1101   CHOLHDL 4 06/30/2022 1101   VLDL 31.0 06/30/2022 1101   LDLCALC 69 06/30/2022 1101   LDLDIRECT 80.0 01/05/2022 0908     Risk Assessment/Calculations:      Physical Exam:    VS:  BP 132/70 (BP Location: Left Arm, Patient Position: Sitting, Cuff Size: Normal)   Pulse 69   Ht 5\' 4"  (1.626 m)   Wt 149 lb 9.6 oz (67.9 kg)   SpO2 99%   BMI 25.68 kg/m     Wt Readings from Last 3 Encounters:  08/21/22 149 lb 9.6 oz (67.9 kg)  06/30/22 151 lb 3.2 oz (68.6 kg)  06/18/22 150 lb (68 kg)     GEN:  Well nourished, well developed in no acute distress HEENT: Normal NECK: No JVD; No carotid bruits CARDIAC: RRR, no murmurs, rubs, gallops RESPIRATORY:  Clear to auscultation without rales, wheezing or rhonchi  ABDOMEN: Soft, non-tender, non-distended MUSCULOSKELETAL:  No edema; No deformity  SKIN: Warm and dry NEUROLOGIC:  Alert and oriented x 3 PSYCHIATRIC:  Normal affect   ASSESSMENT:    1. Coronary artery calcification   2. Primary hypertension   3. Pure hypercholesterolemia   4. Atrial fibrillation, unspecified type (HCC)     PLAN:    In order of problems listed above:  Coronary artery calcification, no chest pain, Myoview with no ischemia, echo with normal EF.  Continue aspirin, statin.   Hypertension, BP controlled. Continue telmisartan 20, amlodipine 5 mg daily Hyperlipidemia, continue Lipitor. Lone atrial fibrillation in the context of UTI sepsis, cardiac monitor with no recurrence.  Continue to monitor off anticoagulation.   Scheduled to follow-up with urology.  Follow-up in 1 year.  Medication Adjustments/Labs and Tests Ordered: Current medicines are reviewed at length with the patient today.  Concerns regarding medicines are  outlined above.  Orders Placed This Encounter  Procedures   EKG 12-Lead    No orders of the defined types were placed in this encounter.    Patient Instructions  Medication Instructions:   Your physician recommends that you continue on your current medications as directed. Please refer to the Current Medication list given to you today.  *If you need a refill on your cardiac medications before your next appointment, please call your pharmacy*   Lab Work:  None Ordered  If you have labs (blood work) drawn today and your tests are completely normal, you will receive your results only by: MyChart Message (if you have MyChart) OR A paper copy in the mail If you have any lab test that is abnormal or we need to change your treatment, we will call you to review the results.   Testing/Procedures:  None Ordered   Follow-Up: At [facility], you and your health needs are our priority.  As part of our continuing mission to provide you with exceptional heart care, we have created designated Provider Care Teams.  These Care Teams include your primary Cardiologist (physician) and Advanced Practice Providers (APPs -  Physician Assistants and Nurse Practitioners) who all work together to provide you with the care you need, when you need it.  We recommend signing up for the patient portal called "MyChart".  Sign up information is provided on this After Visit Summary.  MyChart is used to connect with patients for Virtual Visits (Telemedicine).  Patients are able to view lab/test results, encounter notes, upcoming appointments, etc.  Non-urgent messages can be sent to your provider as well.   To learn more about what you can do with MyChart, go to [email or web address].    Your  next appointment:   12 month(s)  Provider:   You may see Debbe Odea, MD or one of the following Advanced Practice Providers on your designated Care Team:   Nicolasa Ducking, NP Eula Listen, PA-C Cadence Fransico Michael, PA-C Charlsie Quest, NP    Signed, Debbe Odea, MD  08/21/2022 11:06 AM    [facility]

## 2022-08-21 NOTE — Patient Instructions (Signed)
Medication Instructions:   Your physician recommends that you continue on your current medications as directed. Please refer to the Current Medication list given to you today.  *If you need a refill on your cardiac medications before your next appointment, please call your pharmacy*   Lab Work:  None Ordered  If you have labs (blood work) drawn today and your tests are completely normal, you will receive your results only by: MyChart Message (if you have MyChart) OR A paper copy in the mail If you have any lab test that is abnormal or we need to change your treatment, we will call you to review the results.   Testing/Procedures:  None Ordered    Follow-Up: At [facility], you and your health needs are our priority.  As part of our continuing mission to provide you with exceptional heart care, we have created designated Provider Care Teams.  These Care Teams include your primary Cardiologist (physician) and Advanced Practice Providers (APPs -  Physician Assistants and Nurse Practitioners) who all work together to provide you with the care you need, when you need it.  We recommend signing up for the patient portal called "MyChart".  Sign up information is provided on this After Visit Summary.  MyChart is used to connect with patients for Virtual Visits (Telemedicine).  Patients are able to view lab/test results, encounter notes, upcoming appointments, etc.  Non-urgent messages can be sent to your provider as well.   To learn more about what you can do with MyChart, go to [email or web address].    Your next appointment:   12 month(s)  Provider:   You may see Brian Agbor-Etang, MD or one of the following Advanced Practice Providers on your designated Care Team:   Christopher Berge, NP Ryan Dunn, PA-C Cadence Furth, PA-C Sheri Hammock, NP  

## 2022-08-24 ENCOUNTER — Ambulatory Visit (INDEPENDENT_AMBULATORY_CARE_PROVIDER_SITE_OTHER): Payer: HMO | Admitting: Urology

## 2022-08-24 VITALS — BP 187/72 | HR 75 | Ht 64.0 in | Wt 151.1 lb

## 2022-08-24 DIAGNOSIS — N3946 Mixed incontinence: Secondary | ICD-10-CM | POA: Diagnosis not present

## 2022-08-24 DIAGNOSIS — R319 Hematuria, unspecified: Secondary | ICD-10-CM

## 2022-08-24 LAB — MICROSCOPIC EXAMINATION: WBC, UA: >30 /hpf — AB (ref 0–5)

## 2022-08-24 LAB — URINALYSIS, COMPLETE
Bilirubin, UA: NEGATIVE
Glucose, UA: NEGATIVE
Ketones, UA: NEGATIVE
Nitrite, UA: NEGATIVE
Protein,UA: NEGATIVE
Specific Gravity, UA: <1.005 — ABNORMAL LOW (ref 1.005–1.030)
Urobilinogen, Ur: 0.2 mg/dL (ref 0.2–1.0)
pH, UA: 5.5 (ref 5.0–7.5)

## 2022-08-24 NOTE — Progress Notes (Signed)
08/24/2022 2:31 PM   Caitlin Lin Apr 06, 1940 [id number]  Referring provider: Dale [place], MD [address] [place],  [place] [address]  Chief Complaint  Patient presents with   Establish Care   Urinary Incontinence    HPI: I was consulted to assess the patient's recent bladder infection.  She was hospitalized with sepsis.  Discharge diagnoses stated pyelonephritis and chronic renal insufficiency stage IV with glomerular filtration rate less than 30 mL/min.  She also had atrial fibrillation.  She had E. coli in the urine and blood.  She was sent home with 10 days of ciprofloxacin.  She had a myocardial infarction as well.  It does not appear that she had a renal x-ray  At baseline she voids every 1-2 hours gets up once or twice a night.  She sometimes leaks with coughing sneezing.  Sometimes she has urge incontinence.  No bedwetting.  She is quite fastidious and wears 4-5 pads a day that are damp.  If she is out in public she can have a high-volume episode associated with urgency.  Flow was good.  It appears she had a bladder suspension at [facility] many years ago by Dr. Zenda Alpers  No history of kidney stones or recurrent bladder infections   PMH: Past Medical History:  Diagnosis Date   Cataracts, bilateral    Chronic kidney disease    Followed by Dr. Cherylann Ratel   Chronic kidney disease    Colon polyp    Constipation due to slow transit    Diarrhea    Diarrhea    GERD (gastroesophageal reflux disease)    Heart murmur    History of cyst of breast    History of hiatal hernia    Hyperlipidemia    Hypertension    Hypothyroidism    Melanoma of lower leg (HCC) 2013   Shingles 2013   Shingles    Spinal stenosis in cervical region    Dr. Yves Dill   Subdural hematoma [facility])    Thyroid disease     Surgical History: Past Surgical History:  Procedure Laterality Date   ABDOMINAL HYSTERECTOMY  1974   ANTERIOR AND POSTERIOR VAGINAL REPAIR     ANTERIOR AND  POSTERIOR VAGINAL REPAIR W/ SACROSPINOUS LIGAMENT SUSPENSION Right    BREAST BIOPSY  1963   Benign per pt's report   BREAST EXCISIONAL BIOPSY Right 1963   neg   COLONOSCOPY     COLONOSCOPY N/A 10/29/2021   Procedure: COLONOSCOPY;  Surgeon: Toledo, Boykin Nearing, MD;  Location: [facility] ENDOSCOPY;  Service: Gastroenterology;  Laterality: N/A;   ELECTROMAGNETIC NAVIGATION BROCHOSCOPY Right 01/09/2019   Procedure: ELECTROMAGNETIC NAVIGATION BRONCHOSCOPY;  Surgeon: Salena Saner, MD;  Location: [facility] ORS;  Service: Cardiopulmonary;  Laterality: Right;   ESOPHAGOGASTRODUODENOSCOPY     ESOPHAGOGASTRODUODENOSCOPY (EGD) WITH PROPOFOL N/A 11/10/2016   Procedure: ESOPHAGOGASTRODUODENOSCOPY (EGD) WITH PROPOFOL;  Surgeon: Christena Deem, MD;  Location: [facility] ENDOSCOPY;  Service: Endoscopy;  Laterality: N/A;   ESOPHAGOGASTRODUODENOSCOPY (EGD) WITH PROPOFOL N/A 09/09/2018   Procedure: ESOPHAGOGASTRODUODENOSCOPY (EGD) WITH PROPOFOL;  Surgeon: Christena Deem, MD;  Location: [facility] ENDOSCOPY;  Service: Endoscopy;  Laterality: N/A;   ESOPHAGOGASTRODUODENOSCOPY (EGD) WITH PROPOFOL N/A 10/27/2019   Procedure: ESOPHAGOGASTRODUODENOSCOPY (EGD) WITH PROPOFOL;  Surgeon: Earline Mayotte, MD;  Location: [facility] ENDOSCOPY;  Service: Endoscopy;  Laterality: N/A;   ESOPHAGOGASTRODUODENOSCOPY (EGD) WITH PROPOFOL N/A 08/09/2020   Procedure: ESOPHAGOGASTRODUODENOSCOPY (EGD) WITH PROPOFOL;  Surgeon: Regis Bill, MD;  Location: [facility] ENDOSCOPY;  Service: Endoscopy;  Laterality: N/A;  LUNG BIOPSY  2007   Benign per pt's report   LUNG BIOPSY Right    OOPHORECTOMY  1970   PERINEOPLASTY  12/17/2013   sling for stress incontinence  12/18/2011    Home Medications:  Allergies as of 08/24/2022       Reactions   Quinapril Other (See Comments)   Hyperkalemia   Codeine Rash        Medication List        Accurate as of August 24, 2022  2:31 PM. If you have any questions, ask your nurse or doctor.           Alpha-Lipoic Acid 200 MG Tabs Take 200 mg by mouth 3 (three) times daily.   amLODipine 5 MG tablet Commonly known as: NORVASC Take 1 tablet (5 mg total) by mouth daily.   aspirin 81 MG tablet Take 1 tablet (81 mg total) by mouth daily.   atorvastatin 20 MG tablet Commonly known as: LIPITOR Take 1 tablet (20 mg total) by mouth daily.   BENEFIBER PO Take by mouth.   Cholecalciferol 25 MCG (1000 UT) tablet Take 1,000 Units by mouth daily.   Fish Oil 1000 MG Caps Take 1,000 mg by mouth daily.   fluticasone 50 MCG/ACT nasal spray Commonly known as: FLONASE   GLUCOSAMINE CHONDR 500 COMPLEX PO Take 2 tablets by mouth daily.   levothyroxine 75 MCG tablet Commonly known as: SYNTHROID TAKE 1 TABLET BY MOUTH ONCE DAILY IN THE MORNING BEFORE BREAKFAST   MIRALAX PO Take by mouth as needed.   multivitamin tablet Take 1 tablet by mouth daily.   pantoprazole 40 MG tablet Commonly known as: PROTONIX Take 1 tablet (40 mg total) by mouth daily. Take 30 minutes before breakfast   PROBIOTIC DAILY PO Take by mouth daily.   STOOL SOFTENER PO Take by mouth.   telmisartan 20 MG tablet Commonly known as: MICARDIS Take 1 tablet by mouth daily.        Allergies:  Allergies  Allergen Reactions   Quinapril Other (See Comments)    Hyperkalemia   Codeine Rash    Family History: Family History  Problem Relation Age of Onset   Arthritis Mother    Liver disease Mother    Cirrhosis Mother    Liver cancer Mother    Heart disease Father    Heart attack Father    Coronary artery disease Father    Depression Brother    Prostate cancer Brother    Diabetes Maternal Aunt    Breast cancer Cousin        maternal cousins   Liver cancer Cousin    Breast cancer Other     Social History:  reports that she has never smoked. She has never used smokeless tobacco. She reports that she does not drink alcohol and does not use drugs.  ROS:                                         Physical Exam: BP (!) 187/72   Pulse 75   Ht 5\' 4"  (1.626 m)   Wt 68.5 kg   BMI 25.94 kg/m   Constitutional:  Alert and oriented, No acute distress. HEENT: [place] AT, moist mucus membranes.  Trachea midline, no masses.   Laboratory Data: Lab Results  Component Value Date   WBC 4.4 07/31/2022   HGB 12.7 07/31/2022   HCT 38.0  07/31/2022   MCV 90.2 07/31/2022   PLT 219.0 07/31/2022    Lab Results  Component Value Date   CREATININE 1.62 (H) 07/09/2022    No results found for: "PSA"  No results found for: "TESTOSTERONE"  Lab Results  Component Value Date   HGBA1C 5.6 06/30/2022    Urinalysis    Component Value Date/Time   COLORURINE YELLOW (A) 06/18/2022 1355   COLORURINE YELLOW (A) 06/18/2022 1355   APPEARANCEUR CLOUDY (A) 06/18/2022 1355   APPEARANCEUR CLOUDY (A) 06/18/2022 1355   LABSPEC 1.006 06/18/2022 1355   LABSPEC 1.008 06/18/2022 1355   PHURINE 5.0 06/18/2022 1355   PHURINE 5.0 06/18/2022 1355   GLUCOSEU NEGATIVE 06/18/2022 1355   GLUCOSEU NEGATIVE 06/18/2022 1355   GLUCOSEU NEGATIVE 07/22/2018 0839   HGBUR MODERATE (A) 06/18/2022 1355   HGBUR MODERATE (A) 06/18/2022 1355   BILIRUBINUR NEGATIVE 06/18/2022 1355   BILIRUBINUR NEGATIVE 06/18/2022 1355   BILIRUBINUR neg 03/07/2012 1043   KETONESUR NEGATIVE 06/18/2022 1355   KETONESUR NEGATIVE 06/18/2022 1355   PROTEINUR 30 (A) 06/18/2022 1355   PROTEINUR 30 (A) 06/18/2022 1355   UROBILINOGEN 0.2 07/22/2018 0839   NITRITE NEGATIVE 06/18/2022 1355   NITRITE NEGATIVE 06/18/2022 1355   LEUKOCYTESUR LARGE (A) 06/18/2022 1355   LEUKOCYTESUR LARGE (A) 06/18/2022 1355    Pertinent Imaging: Reviewed.  Urine sent for culture.  Chart reviewed-patient had microscopic hematuria  Assessment & Plan: Patient has mixed incontinence.  She developed a recent bladder infection with sepsis.  I believe she has had 1 other since then but did not have a lot of symptoms.  Call if CT scan hematuria is  abnormal.  Have her come back for cystoscopy.  I may or may not put her on prophylaxis.  Treatment goals for mixed incontinence and primary urge incontinence will be discussed.  Call if culture positive  1. Mixed incontinence  - Urinalysis, Complete   No follow-ups on file.  Martina Sinner, MD  [facility] [address], [address] [place], [place] [address] [phone]

## 2022-08-24 NOTE — Patient Instructions (Signed)

## 2022-08-25 ENCOUNTER — Encounter: Payer: Self-pay | Admitting: Internal Medicine

## 2022-08-25 DIAGNOSIS — R319 Hematuria, unspecified: Secondary | ICD-10-CM | POA: Insufficient documentation

## 2022-08-27 LAB — CULTURE, URINE COMPREHENSIVE

## 2022-08-28 ENCOUNTER — Other Ambulatory Visit: Payer: Self-pay | Admitting: Internal Medicine

## 2022-08-28 LAB — CULTURE, URINE COMPREHENSIVE

## 2022-08-31 ENCOUNTER — Other Ambulatory Visit: Payer: Self-pay | Admitting: *Deleted

## 2022-08-31 DIAGNOSIS — N390 Urinary tract infection, site not specified: Secondary | ICD-10-CM | POA: Diagnosis not present

## 2022-08-31 DIAGNOSIS — R350 Frequency of micturition: Secondary | ICD-10-CM | POA: Diagnosis not present

## 2022-08-31 MED ORDER — NITROFURANTOIN MONOHYD MACRO 100 MG PO CAPS: 100.0000 mg | ORAL_CAPSULE | Freq: Two times a day (BID) | ORAL | 0 refills | Status: AC

## 2022-09-01 ENCOUNTER — Ambulatory Visit: Payer: HMO | Admitting: Nurse Practitioner

## 2022-09-07 ENCOUNTER — Telehealth: Payer: Self-pay

## 2022-09-07 NOTE — Telephone Encounter (Signed)
Pt LM on triage line stating that she has completed her antibiotics and is no longer having UTI symptoms but is having weakness.   Called pt no answer. LM informing pt that weakness alone as a symptom is not indicative of a UTI. Advised pt that if she is having UTI symptoms such as burning with urination, urinary urgency, or urinary frequency, fever, or chills to please call our office back, otherwise recommend pt f/u with her PCP office especially with hx of anemia.

## 2022-09-09 DIAGNOSIS — Z961 Presence of intraocular lens: Secondary | ICD-10-CM | POA: Diagnosis not present

## 2022-09-09 DIAGNOSIS — H353131 Nonexudative age-related macular degeneration, bilateral, early dry stage: Secondary | ICD-10-CM | POA: Diagnosis not present

## 2022-09-09 DIAGNOSIS — H43813 Vitreous degeneration, bilateral: Secondary | ICD-10-CM | POA: Diagnosis not present

## 2022-09-10 ENCOUNTER — Ambulatory Visit
Admission: RE | Admit: 2022-09-10 | Discharge: 2022-09-10 | Disposition: A | Discharge status: Home / Self Care | Payer: HMO | Source: Ambulatory Visit | Attending: Urology | Admitting: Urology

## 2022-09-10 ENCOUNTER — Other Ambulatory Visit: Payer: Self-pay | Admitting: Urology

## 2022-09-10 DIAGNOSIS — N3946 Mixed incontinence: Secondary | ICD-10-CM | POA: Insufficient documentation

## 2022-09-10 DIAGNOSIS — R319 Hematuria, unspecified: Secondary | ICD-10-CM | POA: Diagnosis not present

## 2022-09-10 DIAGNOSIS — K5730 Diverticulosis of large intestine without perforation or abscess without bleeding: Secondary | ICD-10-CM | POA: Diagnosis not present

## 2022-09-10 DIAGNOSIS — K449 Diaphragmatic hernia without obstruction or gangrene: Secondary | ICD-10-CM | POA: Diagnosis not present

## 2022-09-10 DIAGNOSIS — N8110 Cystocele, unspecified: Secondary | ICD-10-CM | POA: Diagnosis not present

## 2022-09-10 DIAGNOSIS — R935 Abnormal findings on diagnostic imaging of other abdominal regions, including retroperitoneum: Secondary | ICD-10-CM | POA: Diagnosis not present

## 2022-09-10 LAB — POCT I-STAT CREATININE: Creatinine, Ser: 1.9 mg/dL — ABNORMAL HIGH (ref 0.44–1.00)

## 2022-09-10 MED ORDER — IOHEXOL 300 MG/ML  SOLN: 100.0000 mL | Freq: Once | INTRAMUSCULAR | Status: DC | PRN

## 2022-09-14 ENCOUNTER — Ambulatory Visit: Payer: Self-pay | Admitting: Urology

## 2022-09-16 ENCOUNTER — Other Ambulatory Visit: Payer: Self-pay | Admitting: Internal Medicine

## 2022-10-05 ENCOUNTER — Ambulatory Visit (INDEPENDENT_AMBULATORY_CARE_PROVIDER_SITE_OTHER): Payer: HMO | Admitting: Urology

## 2022-10-05 ENCOUNTER — Encounter: Payer: Self-pay | Admitting: Internal Medicine

## 2022-10-05 VITALS — BP 154/78 | HR 78 | Ht 64.0 in | Wt 149.4 lb

## 2022-10-05 DIAGNOSIS — N390 Urinary tract infection, site not specified: Secondary | ICD-10-CM | POA: Insufficient documentation

## 2022-10-05 DIAGNOSIS — R319 Hematuria, unspecified: Secondary | ICD-10-CM | POA: Diagnosis not present

## 2022-10-05 DIAGNOSIS — N3020 Other chronic cystitis without hematuria: Secondary | ICD-10-CM | POA: Diagnosis not present

## 2022-10-05 DIAGNOSIS — N3946 Mixed incontinence: Secondary | ICD-10-CM

## 2022-10-05 MED ORDER — CIPROFLOXACIN HCL 250 MG PO TABS
250.0000 mg | ORAL_TABLET | Freq: Two times a day (BID) | ORAL | 0 refills | Status: AC
Start: 2022-10-05 — End: 2022-10-12

## 2022-10-05 MED ORDER — TRIMETHOPRIM 100 MG PO TABS
100.0000 mg | ORAL_TABLET | Freq: Every day | ORAL | 11 refills | Status: DC
Start: 2022-10-05 — End: 2023-10-03

## 2022-10-05 NOTE — Progress Notes (Signed)
10/05/2022 8:33 AM   Caitlin Lin 1940-03-25 [id number]  Referring provider: Dale Hickory, MD [address] [place],  [place] [address]  Chief Complaint  Patient presents with   Cysto    HPI: I was consulted to assess the patient's recent bladder infection.  She was hospitalized with sepsis.  Discharge diagnoses stated pyelonephritis and chronic renal insufficiency stage IV with glomerular filtration rate less than 30 mL/min.  She also had atrial fibrillation.  She had E. coli in the urine and blood.  She was sent home with 10 days of ciprofloxacin.  She had a myocardial infarction as well.  It does not appear that she had a renal x-ray   At baseline she voids every 1-2 hours gets up once or twice a night.  She sometimes leaks with coughing sneezing.  Sometimes she has urge incontinence.  No bedwetting.  She is quite fastidious and wears 4-5 pads a day that are damp.  If she is out in public she can have a high-volume episode associated with urgency.  Flow was good.   It appears she had a bladder suspension at [facility] many years ago by Dr. Zenda Alpers    Patient has mixed incontinence.  She developed a recent bladder infection with sepsis.  I believe she has had 1 other since then but did not have a lot of symptoms.  Call if CT scan hematuria is abnormal.  Have her come back for cystoscopy.  I may or may not put her on prophylaxis.  Treatment goals for mixed incontinence and primary urge incontinence will be discussed.  Call if culture positive   TOday Frequency stable.  Last culture positive.  CT scan within normal limits And has some nonspecific symptoms yesterday and she says she normally does not feel when she has cystitis with typical symptoms. Patient is also having some stool incontinence not associated with awareness after she has a bowel movement and wondered if it could be related On pelvic examination she had a small grade 3 cystocele that hinged over the  bladder neck.  She had grade 1 hypermobility and no stress incontinence after cystoscopy of the cystocele reduced.  Vaginal cuff distended from 8 or 9 cm to approximately 6 cm.  No posterior defect. Cystoscopy.  Patient underwent flexible cystoscopy.  She had very cloudy urine.  She had not voided and the urine was quite cloudy with a lot of white flecks.  She had diffuse cystitis cystica.  I looked around her bladder for many minutes and did not see any carcinoma.  Trigone within normal limits.     PMH: Past Medical History:  Diagnosis Date   Cataracts, bilateral    Chronic kidney disease    Followed by Dr. Cherylann Ratel   Chronic kidney disease    Colon polyp    Constipation due to slow transit    Diarrhea    Diarrhea    GERD (gastroesophageal reflux disease)    Heart murmur    History of cyst of breast    History of hiatal hernia    Hyperlipidemia    Hypertension    Hypothyroidism    Melanoma of lower leg (HCC) 2013   Shingles 2013   Shingles    Spinal stenosis in cervical region    Dr. Yves Dill   Subdural hematoma [facility])    Thyroid disease     Surgical History: Past Surgical History:  Procedure Laterality Date   ABDOMINAL HYSTERECTOMY  1974   ANTERIOR AND  POSTERIOR VAGINAL REPAIR     ANTERIOR AND POSTERIOR VAGINAL REPAIR W/ SACROSPINOUS LIGAMENT SUSPENSION Right    BREAST BIOPSY  1963   Benign per pt's report   BREAST EXCISIONAL BIOPSY Right 1963   neg   COLONOSCOPY     COLONOSCOPY N/A 10/29/2021   Procedure: COLONOSCOPY;  Surgeon: Toledo, Boykin Nearing, MD;  Location: [facility] ENDOSCOPY;  Service: Gastroenterology;  Laterality: N/A;   ELECTROMAGNETIC NAVIGATION BROCHOSCOPY Right 01/09/2019   Procedure: ELECTROMAGNETIC NAVIGATION BRONCHOSCOPY;  Surgeon: Salena Saner, MD;  Location: [facility] ORS;  Service: Cardiopulmonary;  Laterality: Right;   ESOPHAGOGASTRODUODENOSCOPY     ESOPHAGOGASTRODUODENOSCOPY (EGD) WITH PROPOFOL N/A 11/10/2016   Procedure: ESOPHAGOGASTRODUODENOSCOPY  (EGD) WITH PROPOFOL;  Surgeon: Christena Deem, MD;  Location: [facility] ENDOSCOPY;  Service: Endoscopy;  Laterality: N/A;   ESOPHAGOGASTRODUODENOSCOPY (EGD) WITH PROPOFOL N/A 09/09/2018   Procedure: ESOPHAGOGASTRODUODENOSCOPY (EGD) WITH PROPOFOL;  Surgeon: Christena Deem, MD;  Location: [facility] ENDOSCOPY;  Service: Endoscopy;  Laterality: N/A;   ESOPHAGOGASTRODUODENOSCOPY (EGD) WITH PROPOFOL N/A 10/27/2019   Procedure: ESOPHAGOGASTRODUODENOSCOPY (EGD) WITH PROPOFOL;  Surgeon: Earline Mayotte, MD;  Location: [facility] ENDOSCOPY;  Service: Endoscopy;  Laterality: N/A;   ESOPHAGOGASTRODUODENOSCOPY (EGD) WITH PROPOFOL N/A 08/09/2020   Procedure: ESOPHAGOGASTRODUODENOSCOPY (EGD) WITH PROPOFOL;  Surgeon: Regis Bill, MD;  Location: [facility] ENDOSCOPY;  Service: Endoscopy;  Laterality: N/A;   LUNG BIOPSY  2007   Benign per pt's report   LUNG BIOPSY Right    OOPHORECTOMY  1970   PERINEOPLASTY  12/17/2013   sling for stress incontinence  12/18/2011    Home Medications:  Allergies as of 10/05/2022       Reactions   Quinapril Other (See Comments)   Hyperkalemia   Codeine Rash        Medication List        Accurate as of October 05, 2022  8:33 AM. If you have any questions, ask your nurse or doctor.          Alpha-Lipoic Acid 200 MG Tabs Take 200 mg by mouth 3 (three) times daily.   amLODipine 5 MG tablet Commonly known as: NORVASC TAKE ONE TABLET BY MOUTH EVERY DAY   aspirin 81 MG tablet Take 1 tablet (81 mg total) by mouth daily.   atorvastatin 20 MG tablet Commonly known as: LIPITOR TAKE ONE TABLET BY MOUTH EVERY DAY   BENEFIBER PO Take by mouth.   Cholecalciferol 25 MCG (1000 UT) tablet Take 1,000 Units by mouth daily.   Fish Oil 1000 MG Caps Take 1,000 mg by mouth daily.   fluticasone 50 MCG/ACT nasal spray Commonly known as: FLONASE   GLUCOSAMINE CHONDR 500 COMPLEX PO Take 2 tablets by mouth daily.   levothyroxine 75 MCG tablet Commonly known as:  SYNTHROID TAKE 1 TABLET BY MOUTH ONCE DAILY IN THE MORNING BEFORE BREAKFAST   MIRALAX PO Take by mouth as needed.   multivitamin tablet Take 1 tablet by mouth daily.   pantoprazole 40 MG tablet Commonly known as: PROTONIX Take 1 tablet (40 mg total) by mouth daily. Take 30 minutes before breakfast   PROBIOTIC DAILY PO Take by mouth daily.   STOOL SOFTENER PO Take by mouth.   telmisartan 20 MG tablet Commonly known as: MICARDIS Take 1 tablet by mouth daily.        Allergies:  Allergies  Allergen Reactions   Quinapril Other (See Comments)    Hyperkalemia   Codeine Rash    Family History: Family History  Problem Relation Age of Onset  Arthritis Mother    Liver disease Mother    Cirrhosis Mother    Liver cancer Mother    Heart disease Father    Heart attack Father    Coronary artery disease Father    Depression Brother    Prostate cancer Brother    Diabetes Maternal Aunt    Breast cancer Cousin        maternal cousins   Liver cancer Cousin    Breast cancer Other     Social History:  reports that she has never smoked. She has never used smokeless tobacco. She reports that she does not drink alcohol and does not use drugs.  ROS:                                        Physical Exam: BP (!) 154/78   Pulse 78   Ht 5\' 4"  (1.626 m)   Wt 67.8 kg   BMI 25.64 kg/m   Constitutional:  Alert and oriented, No acute distress. HEENT: [place] AT, moist mucus membranes.  Trachea midline, no masses.  Laboratory Data: Lab Results  Component Value Date   WBC 4.4 07/31/2022   HGB 12.7 07/31/2022   HCT 38.0 07/31/2022   MCV 90.2 07/31/2022   PLT 219.0 07/31/2022    Lab Results  Component Value Date   CREATININE 1.90 (H) 09/10/2022    No results found for: "PSA"  No results found for: "TESTOSTERONE"  Lab Results  Component Value Date   HGBA1C 5.6 06/30/2022    Urinalysis    Component Value Date/Time   COLORURINE YELLOW (A)  06/18/2022 1355   COLORURINE YELLOW (A) 06/18/2022 1355   APPEARANCEUR Hazy (A) 08/24/2022 1422   LABSPEC 1.006 06/18/2022 1355   LABSPEC 1.008 06/18/2022 1355   PHURINE 5.0 06/18/2022 1355   PHURINE 5.0 06/18/2022 1355   GLUCOSEU Negative 08/24/2022 1422   GLUCOSEU NEGATIVE 07/22/2018 0839   HGBUR MODERATE (A) 06/18/2022 1355   HGBUR MODERATE (A) 06/18/2022 1355   BILIRUBINUR Negative 08/24/2022 1422   KETONESUR NEGATIVE 06/18/2022 1355   KETONESUR NEGATIVE 06/18/2022 1355   PROTEINUR Negative 08/24/2022 1422   PROTEINUR 30 (A) 06/18/2022 1355   PROTEINUR 30 (A) 06/18/2022 1355   UROBILINOGEN 0.2 07/22/2018 0839   NITRITE Negative 08/24/2022 1422   NITRITE NEGATIVE 06/18/2022 1355   NITRITE NEGATIVE 06/18/2022 1355   LEUKOCYTESUR 1+ (A) 08/24/2022 1422   LEUKOCYTESUR LARGE (A) 06/18/2022 1355   LEUKOCYTESUR LARGE (A) 06/18/2022 1355    Pertinent Imaging: Urine aspirated and sent for culture.  It was reviewed.  Assessment & Plan: Patient appears to be having recurrent bladder infections.  Pathophysiology described.  Clinically infected today.  Based on last culture put her on ciprofloxacin 250 mg twice a day for 7 days and I will see her back in about 6 weeks on daily trimethoprim and see if her mixed incontinence and frequency down regulates.  I will check another residual urine volume.  Patient will discuss fecal incontinence which could be related with her soon coming 1. Mixed incontinence  - Urinalysis, Complete  2. Hematuria, unspecified type  - Urinalysis, Complete   No follow-ups on file.  Martina Sinner, MD  [facility] [address], [address] [place], [place] [address] [phone]

## 2022-10-06 LAB — URINALYSIS, COMPLETE
Bilirubin, UA: NEGATIVE
Glucose, UA: NEGATIVE
Ketones, UA: NEGATIVE
Nitrite, UA: NEGATIVE
Protein,UA: NEGATIVE
Specific Gravity, UA: <1.005 — ABNORMAL LOW (ref 1.005–1.030)
Urobilinogen, Ur: 0.2 mg/dL (ref 0.2–1.0)
pH, UA: 5 (ref 5.0–7.5)

## 2022-10-06 LAB — MICROSCOPIC EXAMINATION: WBC, UA: >30 /hpf — AB (ref 0–5)

## 2022-10-07 ENCOUNTER — Telehealth: Payer: Self-pay | Admitting: Internal Medicine

## 2022-10-07 NOTE — Telephone Encounter (Signed)
Called patient to follow up because did not see where she arrived at ED. Patient says she was not calling to be triaged. She is already being treated with cipro by urology and she always has some weakness and low energy when she has UTIs. Not worsening. Not acute. She was calling in because she felt that her BP is running lower than normal for her and was wondering if she should hold her micardis and amlodipine while on the cipro. Confirmed no dizziness, no light headedness. See BP readings below:  90/56 94/58 105/60 130/66 135/65 115/60  Most recent: 122/61  FYI these BP readings were all taken from last night and through out today. Seem to be lower at night and first thing in the morning. She has not been keeping record of them to give me any other readings.

## 2022-10-07 NOTE — Telephone Encounter (Signed)
Given variation in blood pressure and recent infection, I would like to check her here in the office.  See if agreeable to come in Thursday or Friday.  Thanks

## 2022-10-07 NOTE — Telephone Encounter (Signed)
Pt called stating her BP is low at 94/53, low energy, weakness and possible UTI. Sent to access nurse

## 2022-10-07 NOTE — Telephone Encounter (Signed)
Pt scheduled. She is coming in tomorrow morning

## 2022-10-08 ENCOUNTER — Ambulatory Visit (INDEPENDENT_AMBULATORY_CARE_PROVIDER_SITE_OTHER): Payer: HMO | Admitting: Internal Medicine

## 2022-10-08 ENCOUNTER — Encounter: Payer: Self-pay | Admitting: Internal Medicine

## 2022-10-08 VITALS — BP 136/72 | HR 87 | Temp 98.0°F | Resp 16 | Ht 64.0 in | Wt 150.0 lb

## 2022-10-08 DIAGNOSIS — R5383 Other fatigue: Secondary | ICD-10-CM

## 2022-10-08 DIAGNOSIS — I4891 Unspecified atrial fibrillation: Secondary | ICD-10-CM

## 2022-10-08 DIAGNOSIS — D696 Thrombocytopenia, unspecified: Secondary | ICD-10-CM | POA: Diagnosis not present

## 2022-10-08 DIAGNOSIS — E7800 Pure hypercholesterolemia, unspecified: Secondary | ICD-10-CM

## 2022-10-08 DIAGNOSIS — I700 Atherosclerosis of aorta: Secondary | ICD-10-CM | POA: Diagnosis not present

## 2022-10-08 DIAGNOSIS — I10 Essential (primary) hypertension: Secondary | ICD-10-CM

## 2022-10-08 DIAGNOSIS — K219 Gastro-esophageal reflux disease without esophagitis: Secondary | ICD-10-CM | POA: Diagnosis not present

## 2022-10-08 DIAGNOSIS — N390 Urinary tract infection, site not specified: Secondary | ICD-10-CM

## 2022-10-08 DIAGNOSIS — N184 Chronic kidney disease, stage 4 (severe): Secondary | ICD-10-CM | POA: Diagnosis not present

## 2022-10-08 DIAGNOSIS — I351 Nonrheumatic aortic (valve) insufficiency: Secondary | ICD-10-CM

## 2022-10-08 DIAGNOSIS — E039 Hypothyroidism, unspecified: Secondary | ICD-10-CM | POA: Diagnosis not present

## 2022-10-08 DIAGNOSIS — R918 Other nonspecific abnormal finding of lung field: Secondary | ICD-10-CM

## 2022-10-08 DIAGNOSIS — D509 Iron deficiency anemia, unspecified: Secondary | ICD-10-CM

## 2022-10-08 LAB — BASIC METABOLIC PANEL
BUN: 30 mg/dL — ABNORMAL HIGH (ref 6–23)
CO2: 25 mEq/L (ref 19–32)
Calcium: 10.1 mg/dL (ref 8.4–10.5)
Chloride: 106 mEq/L (ref 96–112)
Creatinine, Ser: 2.11 mg/dL — ABNORMAL HIGH (ref 0.40–1.20)
GFR: 21.57 mL/min — ABNORMAL LOW (ref >60.00)
Glucose, Bld: 100 mg/dL — ABNORMAL HIGH (ref 70–99)
Potassium: 5.3 mEq/L — ABNORMAL HIGH (ref 3.5–5.1)
Sodium: 139 mEq/L (ref 135–145)

## 2022-10-08 LAB — CBC WITH DIFFERENTIAL/PLATELET
Basophils Absolute: 0 10*3/uL (ref 0.0–0.1)
Basophils Relative: 1 % (ref 0.0–3.0)
Eosinophils Absolute: 0.2 10*3/uL (ref 0.0–0.7)
Eosinophils Relative: 4 % (ref 0.0–5.0)
HCT: 38 % (ref 36.0–46.0)
Hemoglobin: 12.3 g/dL (ref 12.0–15.0)
Lymphocytes Relative: 42.4 % (ref 12.0–46.0)
Lymphs Abs: 2 10*3/uL (ref 0.7–4.0)
MCHC: 32.3 g/dL (ref 30.0–36.0)
MCV: 90.1 fl (ref 78.0–100.0)
Monocytes Absolute: 0.7 10*3/uL (ref 0.1–1.0)
Monocytes Relative: 15.8 % — ABNORMAL HIGH (ref 3.0–12.0)
Neutro Abs: 1.7 10*3/uL (ref 1.4–7.7)
Neutrophils Relative %: 36.8 % — ABNORMAL LOW (ref 43.0–77.0)
Platelets: 199 10*3/uL (ref 150.0–400.0)
RBC: 4.22 Mil/uL (ref 3.87–5.11)
RDW: 13.6 % (ref 11.5–15.5)
WBC: 4.7 10*3/uL (ref 4.0–10.5)

## 2022-10-08 LAB — CULTURE, URINE COMPREHENSIVE

## 2022-10-08 LAB — TSH: TSH: 0.88 u[IU]/mL (ref 0.35–5.50)

## 2022-10-08 NOTE — Progress Notes (Signed)
Subjective:    Patient ID: Caitlin Lin, female    DOB: [birth year], 82 y.o.   MRN: [id number]  Patient here for  Chief Complaint  Patient presents with   Blood Pressure Check    HPI Here as a work in appt - work in to discuss blood pressure.  She was admitted to [facility] on 06/18/2022 for severe sepsis secondary to pyelonephritis with new onset atrial fibrillation (urine culture demonstrated pan-sensitive E. coli; treated at that time with ciprofloxacin at time of discharge); seen at [facility] walk-in clinic on 07/22/2022 with Klebsiella UTIs resistant ampicillin, intermediate sensitivity to nitrofurantoin-at that time treated with cefdinir; has since followed up with urologist in the outpatient setting on 08/24/2022-urine culture at that visit demonstrated Enterobacter cloacae bacteria.  Had f/u appt with urology 10/05/22 - cystoscopy - ok. Placed on cipro for UTI. F/u 6 weeks.  Will start daily trimethoprim after completing cipro.  Bowels relatively stable with abx.  No increased diarrhea.  Does report increased fatigue.  States noticed yesterday am - legs hurting.  Slight headache.  Also noticed increased heart racing.  Felt jittery.  Blood pressures mid 90s/50s.  Continued to monitor her blood pressure and by yesterday evening - improved.  No heart racing after that episode.  Had afib in the hospital, but has not noticed any other episodes of increased heart racing until the episode above.  No chest pain.  Breathing stable.  No headache now. No dizziness or light headedness.  Eating.  Staying hydrated.  Persistent fatigue.     Past Medical History:  Diagnosis Date   Cataracts, bilateral    Chronic kidney disease    Followed by Dr. Cherylann Ratel   Chronic kidney disease    Colon polyp    Constipation due to slow transit    Diarrhea    Diarrhea    GERD (gastroesophageal reflux disease)    Heart murmur    History of cyst of breast    History of hiatal hernia     Hyperlipidemia    Hypertension    Hypothyroidism    Melanoma of lower leg (HCC) 2013   Shingles 2013   Shingles    Spinal stenosis in cervical region    Dr. Yves Dill   Subdural hematoma [facility])    Thyroid disease    Past Surgical History:  Procedure Laterality Date   ABDOMINAL HYSTERECTOMY  1974   ANTERIOR AND POSTERIOR VAGINAL REPAIR     ANTERIOR AND POSTERIOR VAGINAL REPAIR W/ SACROSPINOUS LIGAMENT SUSPENSION Right    BREAST BIOPSY  1963   Benign per pt's report   BREAST EXCISIONAL BIOPSY Right 1963   neg   COLONOSCOPY     COLONOSCOPY N/A 10/29/2021   Procedure: COLONOSCOPY;  Surgeon: Toledo, Boykin Nearing, MD;  Location: [facility] ENDOSCOPY;  Service: Gastroenterology;  Laterality: N/A;   ELECTROMAGNETIC NAVIGATION BROCHOSCOPY Right 01/09/2019   Procedure: ELECTROMAGNETIC NAVIGATION BRONCHOSCOPY;  Surgeon: Salena Saner, MD;  Location: [facility] ORS;  Service: Cardiopulmonary;  Laterality: Right;   ESOPHAGOGASTRODUODENOSCOPY     ESOPHAGOGASTRODUODENOSCOPY (EGD) WITH PROPOFOL N/A 11/10/2016   Procedure: ESOPHAGOGASTRODUODENOSCOPY (EGD) WITH PROPOFOL;  Surgeon: Christena Deem, MD;  Location: [facility] ENDOSCOPY;  Service: Endoscopy;  Laterality: N/A;   ESOPHAGOGASTRODUODENOSCOPY (EGD) WITH PROPOFOL N/A 09/09/2018   Procedure: ESOPHAGOGASTRODUODENOSCOPY (EGD) WITH PROPOFOL;  Surgeon: Christena Deem, MD;  Location: [facility] ENDOSCOPY;  Service: Endoscopy;  Laterality: N/A;   ESOPHAGOGASTRODUODENOSCOPY (EGD) WITH PROPOFOL N/A 10/27/2019   Procedure: ESOPHAGOGASTRODUODENOSCOPY (EGD) WITH PROPOFOL;  Surgeon: Earline Mayotte, MD;  Location: [facility] ENDOSCOPY;  Service: Endoscopy;  Laterality: N/A;   ESOPHAGOGASTRODUODENOSCOPY (EGD) WITH PROPOFOL N/A 08/09/2020   Procedure: ESOPHAGOGASTRODUODENOSCOPY (EGD) WITH PROPOFOL;  Surgeon: Regis Bill, MD;  Location: [facility] ENDOSCOPY;  Service: Endoscopy;  Laterality: N/A;   LUNG BIOPSY  2007   Benign per pt's report   LUNG BIOPSY Right     OOPHORECTOMY  1970   PERINEOPLASTY  12/17/2013   sling for stress incontinence  12/18/2011   Family History  Problem Relation Age of Onset   Arthritis Mother    Liver disease Mother    Cirrhosis Mother    Liver cancer Mother    Heart disease Father    Heart attack Father    Coronary artery disease Father    Depression Brother    Prostate cancer Brother    Diabetes Maternal Aunt    Breast cancer Cousin        maternal cousins   Liver cancer Cousin    Breast cancer Other    Social History   Socioeconomic History   Marital status: Married    Spouse name: Not on file   Number of children: 2   Years of education: Not on file   Highest education level: Not on file  Occupational History   Occupation: Retired  Tobacco Use   Smoking status: Never   Smokeless tobacco: Never  Vaping Use   Vaping status: Never Used  Substance and Sexual Activity   Alcohol use: No   Drug use: No   Sexual activity: Yes  Other Topics Concern   Not on file  Social History Narrative   Lives in [place] with husband. Has 2 children boy and girl, 4 grandchildren. Retired Insurance underwriter.       Daily Caffeine Use:  NO   Regular Exercise -  Not at this time due to back pain, would like to resume soon   Social Determinants of Health   Financial Resource Strain: Low Risk  (11/26/2021)   Overall Financial Resource Strain (CARDIA)    Difficulty of Paying Living Expenses: Not hard at all  Food Insecurity: No Food Insecurity (06/24/2022)   Hunger Vital Sign    Worried About Running Out of Food in the Last Year: Never true    Ran Out of Food in the Last Year: Never true  Transportation Needs: No Transportation Needs (06/24/2022)   PRAPARE - Administrator, Civil Service (Medical): No    Lack of Transportation (Non-Medical): No  Physical Activity: Insufficiently Active (11/26/2021)   Exercise Vital Sign    Days of Exercise per Week: 4 days    Minutes of Exercise per Session: 20 min  Stress: No  Stress Concern Present (11/26/2021)   [employer] of Occupational Health - Occupational Stress Questionnaire    Feeling of Stress : Not at all  Social Connections: Unknown (11/26/2021)   Social Connection and Isolation Panel [NHANES]    Frequency of Communication with Friends and Family: Not on file    Frequency of Social Gatherings with Friends and Family: Not on file    Attends Religious Services: Not on file    Active Member of Clubs or Organizations: Not on file    Attends Banker Meetings: Not on file    Marital Status: Married     Review of Systems  Constitutional:  Positive for fatigue. Negative for appetite change and unexpected weight change.  HENT:  Negative for congestion and sinus pressure.  Respiratory:  Negative for cough, chest tightness and shortness of breath.   Cardiovascular:  Negative for chest pain and leg swelling.       Episode of increased heart racing as outlined.   Gastrointestinal:  Negative for abdominal pain, nausea and vomiting.  Genitourinary:  Negative for difficulty urinating and dysuria.  Musculoskeletal:  Negative for joint swelling and myalgias.  Skin:  Negative for color change and rash.  Neurological:  Negative for dizziness.       No headache currently.   Psychiatric/Behavioral:  Negative for agitation and dysphoric mood.        Objective:     BP 136/72   Pulse 87   Temp 98 F (36.7 C)   Resp 16   Ht 5\' 4"  (1.626 m)   Wt 150 lb (68 kg)   SpO2 99%   BMI 25.75 kg/m  Wt Readings from Last 3 Encounters:  10/08/22 150 lb (68 kg)  10/05/22 149 lb 6 oz (67.8 kg)  08/24/22 151 lb 2 oz (68.5 kg)    Physical Exam Vitals reviewed.  Constitutional:      General: She is not in acute distress.    Appearance: Normal appearance.  HENT:     Head: Normocephalic and atraumatic.     Right Ear: External ear normal.     Left Ear: External ear normal.  Eyes:     General: No scleral icterus.       Right eye: No discharge.         Left eye: No discharge.     Conjunctiva/sclera: Conjunctivae normal.  Neck:     Thyroid: No thyromegaly.  Cardiovascular:     Rate and Rhythm: Normal rate and regular rhythm.  Pulmonary:     Effort: No respiratory distress.     Breath sounds: Normal breath sounds. No wheezing.  Abdominal:     General: Bowel sounds are normal.     Palpations: Abdomen is soft.     Tenderness: There is no abdominal tenderness.  Musculoskeletal:        General: No swelling or tenderness.     Cervical back: Neck supple. No tenderness.  Lymphadenopathy:     Cervical: No cervical adenopathy.  Skin:    Findings: No erythema or rash.  Neurological:     Mental Status: She is alert.  Psychiatric:        Mood and Affect: Mood normal.        Behavior: Behavior normal.      Outpatient Encounter Medications as of 10/08/2022  Medication Sig   Alpha-Lipoic Acid 200 MG TABS Take 200 mg by mouth 3 (three) times daily.   amLODipine (NORVASC) 5 MG tablet TAKE ONE TABLET BY MOUTH EVERY DAY   aspirin 81 MG tablet Take 1 tablet (81 mg total) by mouth daily.   atorvastatin (LIPITOR) 20 MG tablet TAKE ONE TABLET BY MOUTH EVERY DAY   Cholecalciferol 25 MCG (1000 UT) tablet Take 1,000 Units by mouth daily.   ciprofloxacin (CIPRO) 250 MG tablet Take 1 tablet (250 mg total) by mouth 2 (two) times daily for 7 days.   Docusate Calcium (STOOL SOFTENER PO) Take by mouth.   fluticasone (FLONASE) 50 MCG/ACT nasal spray    Glucosamine-Chondroit-Vit C-Mn (GLUCOSAMINE CHONDR 500 COMPLEX PO) Take 2 tablets by mouth daily.   levothyroxine (SYNTHROID) 75 MCG tablet TAKE 1 TABLET BY MOUTH ONCE DAILY IN THE MORNING BEFORE BREAKFAST   Multiple Vitamin (MULTIVITAMIN) tablet Take 1 tablet by mouth daily.  Omega-3 Fatty Acids (FISH OIL) 1000 MG CAPS Take 1,000 mg by mouth daily.   pantoprazole (PROTONIX) 40 MG tablet Take 1 tablet (40 mg total) by mouth daily. Take 30 minutes before breakfast   Polyethylene Glycol 3350 (MIRALAX PO)  Take by mouth as needed.   Probiotic Product (PROBIOTIC DAILY PO) Take by mouth daily.   telmisartan (MICARDIS) 20 MG tablet Take 1 tablet by mouth daily.   trimethoprim (TRIMPEX) 100 MG tablet Take 1 tablet (100 mg total) by mouth daily.   Wheat Dextrin (BENEFIBER PO) Take by mouth.   No facility-administered encounter medications on file as of 10/08/2022.     Lab Results  Component Value Date   WBC 4.7 10/08/2022   HGB 12.3 10/08/2022   HCT 38.0 10/08/2022   PLT 199.0 10/08/2022   GLUCOSE 100 (H) 10/08/2022   CHOL 139 06/30/2022   TRIG 155.0 (H) 06/30/2022   HDL 38.10 (L) 06/30/2022   LDLDIRECT 80.0 01/05/2022   LDLCALC 69 06/30/2022   ALT 23 06/30/2022   AST 24 06/30/2022   NA 139 10/08/2022   K 5.3 No hemolysis seen (H) 10/08/2022   CL 106 10/08/2022   CREATININE 2.11 (H) 10/08/2022   BUN 30 (H) 10/08/2022   CO2 25 10/08/2022   TSH 0.88 10/08/2022   INR 1.1 06/18/2022   HGBA1C 5.6 06/30/2022   MICROALBUR 2.1 (H) 03/27/2015    CT RENAL STONE STUDY  Result Date: 09/14/2022 CLINICAL DATA:  Recurrent UTI.  Hematuria.  Chronic kidney disease. EXAM: CT ABDOMEN AND PELVIS WITHOUT CONTRAST TECHNIQUE: Multidetector CT imaging of the abdomen and pelvis was performed following the standard protocol without IV contrast. RADIATION DOSE REDUCTION: This exam was performed according to the departmental dose-optimization program which includes automated exposure control, adjustment of the mA and/or kV according to patient size and/or use of iterative reconstruction technique. COMPARISON:  06/18/2022 CT chest, abdomen and pelvis. FINDINGS: Lower chest: No significant pulmonary nodules or acute consolidative airspace disease. Hepatobiliary: Normal liver size. No liver mass. Normal gallbladder with no radiopaque cholelithiasis. No biliary ductal dilatation. Chronic moderate periampullary duodenal diverticulum containing a small amount of layering dense debris. Pancreas: Normal, with no mass or  duct dilation. Spleen: Normal size. No mass. Adrenals/Urinary Tract: Normal adrenals. Mildly prominent bilateral extrarenal pelves, unchanged. No overt hydronephrosis. No renal stones. Simple 1.1 cm medial upper right renal cyst, for which no follow-up imaging is recommended. Otherwise no contour deforming renal masses. Normal caliber ureters. No ureteral stones. Small to moderate cystocele. Tiny amount of gas in the non dependent bladder lumen, similar to prior CT. Otherwise normal bladder with no bladder stones or bladder wall thickening. Stomach/Bowel: Chronic small hiatal hernia. Otherwise normal nondistended stomach. Normal caliber small bowel with no small bowel wall thickening. Appendix not discretely visualized. Moderate sigmoid diverticulosis with no large bowel wall thickening or acute pericolonic fat stranding. Vascular/Lymphatic: Atherosclerotic nonaneurysmal abdominal aorta. No pathologically enlarged lymph nodes in the abdomen or pelvis. Reproductive: Status post hysterectomy, with no abnormal findings at the vaginal cuff. No adnexal mass. Other: No pneumoperitoneum, ascites or focal fluid collection. Musculoskeletal: No aggressive appearing focal osseous lesions. Marked thoracolumbar spondylosis. IMPRESSION: 1. No urolithiasis. Chronic fullness of bilateral extrarenal pelves without overt hydronephrosis. 2. Small to moderate cystocele. Tiny amount of gas in the non-dependent bladder lumen of uncertain etiology, also present on prior CT. Correlate with urinalysis and with any history of recent bladder instrumentation. 3. Moderate sigmoid diverticulosis. 4. Chronic small hiatal hernia. 5.  Aortic Atherosclerosis (ICD10-I70.0).  Electronically Signed   By: Delbert Phenix M.D.   On: 09/14/2022 17:09       Assessment & Plan:  CKD (chronic kidney disease) stage 4, GFR 15-29 ml/min [facility]) Assessment & Plan: Followed by nephrology -  by Dr Eulah Pont.  Evaluated 05/2022.  Losartan changed to telmisartan.  Blood  pressure has been controlled.  Recent low as outlined.  Back up now.  Not orthostatic on exam.  Continue telmisartan as directed.  Follow pressures.  Recheck metabolic panel.   Orders: -     CBC with Differential/Platelet -     Basic metabolic panel  Atrial fibrillation, unspecified type (HCC) Assessment & Plan: Was found to have new onset afib with RVR (up to 186) - during recent hospitalization. Was given IV cardizem - followed by oral cardizem.  CXR negative.  Dr Kirke Corin consulted. Found to have UTI - E.coli pan sensitive.  IV abx and discharged on cipro for 10 days total abx.  Not started on anticoagulants.  Was felt afib - related to acute illness.  Zio monitor placed.  Discharged on metoprolol. Work in today with symptoms as outlined.  States noticed yesterday am - legs hurting.  Slight headache.  Also noticed increased heart racing.  Felt jittery.  Blood pressures mid 90s/50s.  Continued to monitor her blood pressure and by yesterday evening - improved.  No heart racing after that episode. Blood pressure improved.  Completing abx for UTI.  Discussed f/u with cardiology - given recent episode as outlined.  EKG today - SR.  Question of need for further cardiac testing - repeat monitor?, etc.   Orders: -     EKG 12-Lead -     TSH  Iron deficiency anemia, unspecified iron deficiency anemia type Assessment & Plan: Recheck cbc today to confirm stable.    Aortic atherosclerosis (HCC) Assessment & Plan: Continue lipitor.    Aortic valve insufficiency, etiology of cardiac valve disease unspecified Assessment & Plan: ECHO - aortic regurgitation.  Saw cardiology.  Coronary artery calcifications on noncontrast chest CT. Previous echocardiogram with normal systolic and diastolic function.  Lexiscan Myoview 03/2020 with no significant ischemia, low risk.  Continue aspirin, statin.  With recent episode of increased heart rate and increased fatigue, check labs as outlined.  Discussed f/u with  cardiology.  Had recent echo 06/2022.  Question of need for repeat monitor.     Fatigue, unspecified type Assessment & Plan: Currently being treated for UTI.  Complete cipro.  Planning for low dose prophylaxis once completed.  Episode of increased heart racing and decrease blood pressure as outlined.  EKG - SR.  Blood pressure improved.  Not orthostatic on exam.  Check cbc and metabolic panel as outlined.  Further cardiac w/up as outlined.     Frequent UTI Assessment & Plan: Saw urology - f/u 09/2022.  Cystoscopy - ok.  Treat cipro and continued trimethoprim.    Gastroesophageal reflux disease without esophagitis Assessment & Plan: Continues protonix in am.  Follow.    Hypercholesterolemia Assessment & Plan: Continue lipitor.  Low cholesterol diet and exercise.  Follow lipid panel and liver function tests.     Primary hypertension Assessment & Plan: Blood pressure as outlined. On amlodipine and telmisartan.  Not orthostatic on exam.  Blood pressure back up. Hold on making changes in her medication. Follow pressures.  Follow metabolic panel. Spot check pressures and send in readings.    Thrombocytopenia (HCC) Assessment & Plan: Follow cbc.    Lung nodules Assessment & Plan:  Had f/u with Dr Jayme Cloud 03/2022.  F/u Chest CT shows that the previous area of concern completely resolved.  She does not need any more chest CTs.    Hypothyroidism, unspecified type Assessment & Plan: On thyroid replacement.  Follow tsh.       Dale Wiscon, MD

## 2022-10-09 ENCOUNTER — Telehealth: Payer: Self-pay

## 2022-10-09 DIAGNOSIS — E875 Hyperkalemia: Secondary | ICD-10-CM

## 2022-10-09 NOTE — Telephone Encounter (Signed)
Labs ordered.

## 2022-10-09 NOTE — Telephone Encounter (Signed)
Patient returned office phone call note was read. She is not taking any potassium supplements, patient states she has been eating a lot of tomato sandwiches, will stop. Lab was scheduled for next week, please order labs.

## 2022-10-09 NOTE — Telephone Encounter (Signed)
-----   Message from [place] sent at 10/09/2022  5:39 AM EDT ----- Notify - potassium is increased slightly.  Kidney function has decreased.  Have her to continue to limit foods with increased potassium.  Confirm not taking any potassium supplements or using an increased amount of salt substitutes.  Needs to stay hydrated.  Will need to recheck kidney function to confirm stable/improved.  Also needs f/u with her nephrologist.  (Sees Dr Eulah Pont). Please schedule f/u.  Recheck met b (stat) within one week. White blood cell count, hgb and thyroid wnl.

## 2022-10-09 NOTE — Addendum Note (Signed)
Addended by: Rita Ohara D on: 10/09/2022 01:24 PM   Modules accepted: Orders

## 2022-10-10 ENCOUNTER — Telehealth: Payer: Self-pay | Admitting: Internal Medicine

## 2022-10-10 ENCOUNTER — Encounter: Payer: Self-pay | Admitting: Internal Medicine

## 2022-10-10 NOTE — Assessment & Plan Note (Signed)
Recheck cbc today to confirm stable.  

## 2022-10-10 NOTE — Assessment & Plan Note (Signed)
Currently being treated for UTI.  Complete cipro.  Planning for low dose prophylaxis once completed.  Episode of increased heart racing and decrease blood pressure as outlined.  EKG - SR.  Blood pressure improved.  Not orthostatic on exam.  Check cbc and metabolic panel as outlined.  Further cardiac w/up as outlined.

## 2022-10-10 NOTE — Assessment & Plan Note (Signed)
On thyroid replacement.  Follow tsh.  

## 2022-10-10 NOTE — Assessment & Plan Note (Signed)
Continue lipitor.  Low cholesterol diet and exercise.  Follow lipid panel and liver function tests.   

## 2022-10-10 NOTE — Assessment & Plan Note (Signed)
Follow cbc.  

## 2022-10-10 NOTE — Telephone Encounter (Signed)
Saw pt as work in - increased heart rate and decreased blood pressure. Needs f/u with Dr Azucena Cecil - cardiology.  Please schedule.  Thanks.

## 2022-10-10 NOTE — Assessment & Plan Note (Signed)
Had f/u with Dr Jayme Cloud 03/2022.  F/u Chest CT shows that the previous area of concern completely resolved.  She does not need any more chest CTs.

## 2022-10-10 NOTE — Assessment & Plan Note (Addendum)
ECHO - aortic regurgitation.  Saw cardiology.  Coronary artery calcifications on noncontrast chest CT. Previous echocardiogram with normal systolic and diastolic function.  Lexiscan Myoview 03/2020 with no significant ischemia, low risk.  Continue aspirin, statin.  With recent episode of increased heart rate and increased fatigue, check labs as outlined.  Discussed f/u with cardiology.  Had recent echo 06/2022.  Question of need for repeat monitor.

## 2022-10-10 NOTE — Assessment & Plan Note (Signed)
Followed by nephrology -  by Dr Eulah Pont.  Evaluated 05/2022.  Losartan changed to telmisartan.  Blood pressure has been controlled.  Recent low as outlined.  Back up now.  Not orthostatic on exam.  Continue telmisartan as directed.  Follow pressures.  Recheck metabolic panel.

## 2022-10-10 NOTE — Assessment & Plan Note (Signed)
Continue lipitor  ?

## 2022-10-10 NOTE — Assessment & Plan Note (Signed)
Saw urology - f/u 09/2022.  Cystoscopy - ok.  Treat cipro and continued trimethoprim.

## 2022-10-10 NOTE — Assessment & Plan Note (Signed)
Was found to have new onset afib with RVR (up to 186) - during recent hospitalization. Was given IV cardizem - followed by oral cardizem.  CXR negative.  Dr Kirke Corin consulted. Found to have UTI - E.coli pan sensitive.  IV abx and discharged on cipro for 10 days total abx.  Not started on anticoagulants.  Was felt afib - related to acute illness.  Zio monitor placed.  Discharged on metoprolol. Work in today with symptoms as outlined.  States noticed yesterday am - legs hurting.  Slight headache.  Also noticed increased heart racing.  Felt jittery.  Blood pressures mid 90s/50s.  Continued to monitor her blood pressure and by yesterday evening - improved.  No heart racing after that episode. Blood pressure improved.  Completing abx for UTI.  Discussed f/u with cardiology - given recent episode as outlined.  EKG today - SR.  Question of need for further cardiac testing - repeat monitor?, etc.

## 2022-10-10 NOTE — Assessment & Plan Note (Signed)
Continues protonix in am.  Follow.

## 2022-10-10 NOTE — Assessment & Plan Note (Signed)
Blood pressure as outlined. On amlodipine and telmisartan.  Not orthostatic on exam.  Blood pressure back up. Hold on making changes in her medication. Follow pressures.  Follow metabolic panel. Spot check pressures and send in readings.

## 2022-10-12 ENCOUNTER — Other Ambulatory Visit: Payer: HMO | Admitting: Urology

## 2022-10-13 NOTE — Telephone Encounter (Signed)
Patient scheduled for tomorrow with Dr Sandie Ano. Pt aware of appt date and time.

## 2022-10-14 ENCOUNTER — Encounter: Payer: Self-pay | Admitting: Cardiology

## 2022-10-14 ENCOUNTER — Ambulatory Visit: Payer: HMO | Attending: Cardiology | Admitting: Cardiology

## 2022-10-14 ENCOUNTER — Encounter (INDEPENDENT_AMBULATORY_CARE_PROVIDER_SITE_OTHER): Payer: Self-pay

## 2022-10-14 VITALS — BP 140/72 | HR 69 | Ht 64.0 in | Wt 148.4 lb

## 2022-10-14 DIAGNOSIS — E7800 Pure hypercholesterolemia, unspecified: Secondary | ICD-10-CM

## 2022-10-14 DIAGNOSIS — I2510 Atherosclerotic heart disease of native coronary artery without angina pectoris: Secondary | ICD-10-CM

## 2022-10-14 DIAGNOSIS — I2584 Coronary atherosclerosis due to calcified coronary lesion: Secondary | ICD-10-CM

## 2022-10-14 DIAGNOSIS — I10 Essential (primary) hypertension: Secondary | ICD-10-CM | POA: Diagnosis not present

## 2022-10-14 DIAGNOSIS — I4891 Unspecified atrial fibrillation: Secondary | ICD-10-CM | POA: Diagnosis not present

## 2022-10-14 NOTE — Patient Instructions (Signed)
Medication Instructions:   Restart Amlodipine - Take one tablet ( 5mg ) by mouth daily.   *If you need a refill on your cardiac medications before your next appointment, please call your pharmacy*   Lab Work:  None Ordered  If you have labs (blood work) drawn today and your tests are completely normal, you will receive your results only by: MyChart Message (if you have MyChart) OR A paper copy in the mail If you have any lab test that is abnormal or we need to change your treatment, we will call you to review the results.   Testing/Procedures:  None Ordered   Follow-Up: At [facility], you and your health needs are our priority.  As part of our continuing mission to provide you with exceptional heart care, we have created designated Provider Care Teams.  These Care Teams include your primary Cardiologist (physician) and Advanced Practice Providers (APPs -  Physician Assistants and Nurse Practitioners) who all work together to provide you with the care you need, when you need it.  We recommend signing up for the patient portal called "MyChart".  Sign up information is provided on this After Visit Summary.  MyChart is used to connect with patients for Virtual Visits (Telemedicine).  Patients are able to view lab/test results, encounter notes, upcoming appointments, etc.  Non-urgent messages can be sent to your provider as well.   To learn more about what you can do with MyChart, go to [email or web address].    Your next appointment:   6 month(s)  Provider:   You may see Debbe Odea, MD or one of the following Advanced Practice Providers on your designated Care Team:   Nicolasa Ducking, NP Eula Listen, PA-C Cadence Fransico Michael, PA-C Charlsie Quest, NP

## 2022-10-14 NOTE — Progress Notes (Signed)
Cardiology Office Note:    Date:  10/14/2022   ID:  Caitlin Lin, DOB February 15, 1941, MRN [id number]  PCP:  Dale Lake Dalecarlia, MD  [facility] Cardiologist:  Debbe Odea, MD  [facility] Electrophysiologist:  None   Referring MD: Dale Andover, MD   Chief Complaint  Patient presents with   Follow-up    PCP advised to be seen due to Increased HR with decreased blood pressure.  EKG was obtained at PCP office on 10/08/22 (available in chart).  Has not taken Amlodipine for 6 days due to low bp during the day.    History of Present Illness:    Caitlin Lin is a 82 y.o. female with a hx of coronary calcifications, hypertension, hyperlipidemia, lone atrial fibrillation in the context of UTI, mild aortic regurgitation, who presents due to low BP and elevated heart rates.  Patient was diagnosed with a urinary tract infection last week, she has a history of frequent UTIs.  States feeling dizzy, checked her blood pressure which was running in the 90s.  She talked to primary care physician, amlodipine was held.  Was treated with antibiotics over the past week, started on UTI prophylaxis medication per urology.  States currently feeling well, systolic blood pressure ranges in the 140s to 160s.  Prior notes Echo 12/2018 EF 60 to 65% Myoview 03/2020 low risk scan, no evidence for ischemia Chest CT without contrast 08/2019, coronary artery calcifications.  Past Medical History:  Diagnosis Date   Cataracts, bilateral    Chronic kidney disease    Followed by Dr. Cherylann Ratel   Chronic kidney disease    Colon polyp    Constipation due to slow transit    Diarrhea    Diarrhea    GERD (gastroesophageal reflux disease)    Heart murmur    History of cyst of breast    History of hiatal hernia    Hyperlipidemia    Hypertension    Hypothyroidism    Melanoma of lower leg (HCC) 2013   Shingles 2013   Shingles    Spinal stenosis in cervical region    Dr. Yves Dill   Subdural hematoma [facility])     Thyroid disease     Past Surgical History:  Procedure Laterality Date   ABDOMINAL HYSTERECTOMY  1974   ANTERIOR AND POSTERIOR VAGINAL REPAIR     ANTERIOR AND POSTERIOR VAGINAL REPAIR W/ SACROSPINOUS LIGAMENT SUSPENSION Right    BREAST BIOPSY  1963   Benign per pt's report   BREAST EXCISIONAL BIOPSY Right 1963   neg   COLONOSCOPY     COLONOSCOPY N/A 10/29/2021   Procedure: COLONOSCOPY;  Surgeon: Toledo, Boykin Nearing, MD;  Location: [facility] ENDOSCOPY;  Service: Gastroenterology;  Laterality: N/A;   ELECTROMAGNETIC NAVIGATION BROCHOSCOPY Right 01/09/2019   Procedure: ELECTROMAGNETIC NAVIGATION BRONCHOSCOPY;  Surgeon: Salena Saner, MD;  Location: [facility] ORS;  Service: Cardiopulmonary;  Laterality: Right;   ESOPHAGOGASTRODUODENOSCOPY     ESOPHAGOGASTRODUODENOSCOPY (EGD) WITH PROPOFOL N/A 11/10/2016   Procedure: ESOPHAGOGASTRODUODENOSCOPY (EGD) WITH PROPOFOL;  Surgeon: Christena Deem, MD;  Location: [facility] ENDOSCOPY;  Service: Endoscopy;  Laterality: N/A;   ESOPHAGOGASTRODUODENOSCOPY (EGD) WITH PROPOFOL N/A 09/09/2018   Procedure: ESOPHAGOGASTRODUODENOSCOPY (EGD) WITH PROPOFOL;  Surgeon: Christena Deem, MD;  Location: [facility] ENDOSCOPY;  Service: Endoscopy;  Laterality: N/A;   ESOPHAGOGASTRODUODENOSCOPY (EGD) WITH PROPOFOL N/A 10/27/2019   Procedure: ESOPHAGOGASTRODUODENOSCOPY (EGD) WITH PROPOFOL;  Surgeon: Earline Mayotte, MD;  Location: [facility] ENDOSCOPY;  Service: Endoscopy;  Laterality: N/A;   ESOPHAGOGASTRODUODENOSCOPY (EGD) WITH PROPOFOL N/A  08/09/2020   Procedure: ESOPHAGOGASTRODUODENOSCOPY (EGD) WITH PROPOFOL;  Surgeon: Regis Bill, MD;  Location: [facility] ENDOSCOPY;  Service: Endoscopy;  Laterality: N/A;   LUNG BIOPSY  2007   Benign per pt's report   LUNG BIOPSY Right    OOPHORECTOMY  1970   PERINEOPLASTY  12/17/2013   sling for stress incontinence  12/18/2011    Current Medications: Current Meds  Medication Sig   Alpha-Lipoic Acid 200 MG TABS Take 200 mg by mouth 3  (three) times daily.   amLODipine (NORVASC) 5 MG tablet TAKE ONE TABLET BY MOUTH EVERY DAY   aspirin 81 MG tablet Take 1 tablet (81 mg total) by mouth daily.   atorvastatin (LIPITOR) 20 MG tablet TAKE ONE TABLET BY MOUTH EVERY DAY   Cholecalciferol 25 MCG (1000 UT) tablet Take 1,000 Units by mouth daily.   Docusate Calcium (STOOL SOFTENER PO) Take by mouth.   Glucosamine-Chondroit-Vit C-Mn (GLUCOSAMINE CHONDR 500 COMPLEX PO) Take 2 tablets by mouth daily.   levothyroxine (SYNTHROID) 75 MCG tablet TAKE 1 TABLET BY MOUTH ONCE DAILY IN THE MORNING BEFORE BREAKFAST   Multiple Vitamin (MULTIVITAMIN) tablet Take 1 tablet by mouth daily.   Omega-3 Fatty Acids (FISH OIL) 1000 MG CAPS Take 1,000 mg by mouth daily.   pantoprazole (PROTONIX) 40 MG tablet Take 1 tablet (40 mg total) by mouth daily. Take 30 minutes before breakfast   Polyethylene Glycol 3350 (MIRALAX PO) Take by mouth as needed.   Probiotic Product (PROBIOTIC DAILY PO) Take by mouth daily.   telmisartan (MICARDIS) 20 MG tablet Take 1 tablet by mouth daily.   trimethoprim (TRIMPEX) 100 MG tablet Take 1 tablet (100 mg total) by mouth daily.   Wheat Dextrin (BENEFIBER PO) Take by mouth.     Allergies:   Quinapril and Codeine   Social History   Socioeconomic History   Marital status: Married    Spouse name: Not on file   Number of children: 2   Years of education: Not on file   Highest education level: Not on file  Occupational History   Occupation: Retired  Tobacco Use   Smoking status: Never   Smokeless tobacco: Never  Vaping Use   Vaping status: Never Used  Substance and Sexual Activity   Alcohol use: No   Drug use: No   Sexual activity: Yes  Other Topics Concern   Not on file  Social History Narrative   Lives in [place] with husband. Has 2 children boy and girl, 4 grandchildren. Retired Insurance underwriter.       Daily Caffeine Use:  NO   Regular Exercise -  Not at this time due to back pain, would like to resume soon    Social Determinants of Health   Financial Resource Strain: Low Risk  (11/26/2021)   Overall Financial Resource Strain (CARDIA)    Difficulty of Paying Living Expenses: Not hard at all  Food Insecurity: No Food Insecurity (06/24/2022)   Hunger Vital Sign    Worried About Running Out of Food in the Last Year: Never true    Ran Out of Food in the Last Year: Never true  Transportation Needs: No Transportation Needs (06/24/2022)   PRAPARE - Administrator, Civil Service (Medical): No    Lack of Transportation (Non-Medical): No  Physical Activity: Insufficiently Active (11/26/2021)   Exercise Vital Sign    Days of Exercise per Week: 4 days    Minutes of Exercise per Session: 20 min  Stress: No Stress Concern  Present (11/26/2021)   [employer] of Occupational Health - Occupational Stress Questionnaire    Feeling of Stress : Not at all  Social Connections: Unknown (11/26/2021)   Social Connection and Isolation Panel [NHANES]    Frequency of Communication with Friends and Family: Not on file    Frequency of Social Gatherings with Friends and Family: Not on file    Attends Religious Services: Not on file    Active Member of Clubs or Organizations: Not on file    Attends Banker Meetings: Not on file    Marital Status: Married     Family History: The patient's family history includes Arthritis in her mother; Breast cancer in her cousin and another family member; Cirrhosis in her mother; Coronary artery disease in her father; Depression in her brother; Diabetes in her maternal aunt; Heart attack in her father; Heart disease in her father; Liver cancer in her cousin and mother; Liver disease in her mother; Prostate cancer in her brother.  ROS:   Please see the history of present illness.     All other systems reviewed and are negative.  EKGs/Labs/Other Studies Reviewed:    The following studies were reviewed today:   EKG:  EKG not ordered today.    Recent  Labs: 06/18/2022: Magnesium 1.7 06/30/2022: ALT 23 10/08/2022: BUN 30; Creatinine, Ser 2.11; Hemoglobin 12.3; Platelets 199.0; Potassium 5.3 No hemolysis seen; Sodium 139; TSH 0.88  Recent Lipid Panel    Component Value Date/Time   CHOL 139 06/30/2022 1101   TRIG 155.0 (H) 06/30/2022 1101   HDL 38.10 (L) 06/30/2022 1101   CHOLHDL 4 06/30/2022 1101   VLDL 31.0 06/30/2022 1101   LDLCALC 69 06/30/2022 1101   LDLDIRECT 80.0 01/05/2022 0908     Risk Assessment/Calculations:      Physical Exam:    VS:  BP (!) 140/72 (BP Location: Left Arm, Patient Position: Sitting, Cuff Size: Normal)   Pulse 69   Ht 5\' 4"  (1.626 m)   Wt 148 lb 6.4 oz (67.3 kg)   SpO2 98%   BMI 25.47 kg/m     Wt Readings from Last 3 Encounters:  10/14/22 148 lb 6.4 oz (67.3 kg)  10/08/22 150 lb (68 kg)  10/05/22 149 lb 6 oz (67.8 kg)     GEN:  Well nourished, well developed in no acute distress HEENT: Normal NECK: No JVD; No carotid bruits CARDIAC: RRR, no murmurs, rubs, gallops RESPIRATORY:  Clear to auscultation without rales, wheezing or rhonchi  ABDOMEN: Soft, non-tender, non-distended MUSCULOSKELETAL:  No edema; No deformity  SKIN: Warm and dry NEUROLOGIC:  Alert and oriented x 3 PSYCHIATRIC:  Normal affect   ASSESSMENT:    1. Coronary artery calcification   2. Primary hypertension   3. Pure hypercholesterolemia   4. Atrial fibrillation, unspecified type (HCC)    PLAN:    In order of problems listed above:  Coronary artery calcification, no chest pain, Myoview with no ischemia, echo with normal EF.  Continue aspirin, statin.   Hypertension, BP currently elevated.  Episode of hypotension last week in the context of a urinary tract infection.  Continue telmisartan 20, restart amlodipine 5 mg daily.  Advised to check BP frequently, if BP becomes low especially with a UTI, advised to hold Norvasc. Hyperlipidemia, continue Lipitor. Lone atrial fibrillation in the context of UTI sepsis, cardiac  monitor with no recurrence.  Continue to monitor off anticoagulation.   Follow-up in 6 months.  Medication Adjustments/Labs and Tests Ordered: Current  medicines are reviewed at length with the patient today.  Concerns regarding medicines are outlined above.  No orders of the defined types were placed in this encounter.   No orders of the defined types were placed in this encounter.    Patient Instructions  Medication Instructions:   Restart Amlodipine - Take one tablet ( 5mg ) by mouth daily.   *If you need a refill on your cardiac medications before your next appointment, please call your pharmacy*   Lab Work:  None Ordered  If you have labs (blood work) drawn today and your tests are completely normal, you will receive your results only by: MyChart Message (if you have MyChart) OR A paper copy in the mail If you have any lab test that is abnormal or we need to change your treatment, we will call you to review the results.   Testing/Procedures:  None Ordered   Follow-Up: At [facility], you and your health needs are our priority.  As part of our continuing mission to provide you with exceptional heart care, we have created designated Provider Care Teams.  These Care Teams include your primary Cardiologist (physician) and Advanced Practice Providers (APPs -  Physician Assistants and Nurse Practitioners) who all work together to provide you with the care you need, when you need it.  We recommend signing up for the patient portal called "MyChart".  Sign up information is provided on this After Visit Summary.  MyChart is used to connect with patients for Virtual Visits (Telemedicine).  Patients are able to view lab/test results, encounter notes, upcoming appointments, etc.  Non-urgent messages can be sent to your provider as well.   To learn more about what you can do with MyChart, go to [email or web address].    Your next appointment:   6 month(s)  Provider:    You may see Debbe Odea, MD or one of the following Advanced Practice Providers on your designated Care Team:   Nicolasa Ducking, NP Eula Listen, PA-C Cadence Fransico Michael, PA-C Charlsie Quest, NP    Signed, Debbe Odea, MD  10/14/2022 12:00 PM    [facility]

## 2022-10-15 ENCOUNTER — Other Ambulatory Visit: Payer: Self-pay | Admitting: Internal Medicine

## 2022-10-15 ENCOUNTER — Other Ambulatory Visit (INDEPENDENT_AMBULATORY_CARE_PROVIDER_SITE_OTHER): Payer: HMO

## 2022-10-15 DIAGNOSIS — E875 Hyperkalemia: Secondary | ICD-10-CM | POA: Diagnosis not present

## 2022-10-15 DIAGNOSIS — Z1231 Encounter for screening mammogram for malignant neoplasm of breast: Secondary | ICD-10-CM

## 2022-10-15 LAB — BASIC METABOLIC PANEL
BUN: 41 mg/dL — ABNORMAL HIGH (ref 6–23)
CO2: 24 mEq/L (ref 19–32)
Calcium: 9.9 mg/dL (ref 8.4–10.5)
Chloride: 107 mEq/L (ref 96–112)
Creatinine, Ser: 2 mg/dL — ABNORMAL HIGH (ref 0.40–1.20)
GFR: 23 mL/min — ABNORMAL LOW (ref >60.00)
Glucose, Bld: 97 mg/dL (ref 70–99)
Potassium: 5.5 mEq/L — ABNORMAL HIGH (ref 3.5–5.1)
Sodium: 138 mEq/L (ref 135–145)

## 2022-10-16 ENCOUNTER — Other Ambulatory Visit
Admission: RE | Admit: 2022-10-16 | Discharge: 2022-10-16 | Disposition: A | Discharge status: Home / Self Care | Payer: HMO | Attending: Internal Medicine | Admitting: Internal Medicine

## 2022-10-16 ENCOUNTER — Other Ambulatory Visit: Payer: Self-pay

## 2022-10-16 DIAGNOSIS — E875 Hyperkalemia: Secondary | ICD-10-CM | POA: Diagnosis not present

## 2022-10-16 LAB — POTASSIUM: Potassium: 4.4 mmol/L (ref 3.5–5.1)

## 2022-10-21 ENCOUNTER — Telehealth: Payer: Self-pay | Admitting: Internal Medicine

## 2022-10-21 DIAGNOSIS — I10 Essential (primary) hypertension: Secondary | ICD-10-CM

## 2022-10-21 DIAGNOSIS — R739 Hyperglycemia, unspecified: Secondary | ICD-10-CM

## 2022-10-21 DIAGNOSIS — E7800 Pure hypercholesterolemia, unspecified: Secondary | ICD-10-CM

## 2022-10-21 NOTE — Telephone Encounter (Signed)
Patient need lab orders.

## 2022-10-21 NOTE — Telephone Encounter (Signed)
Order placed for fasting labs.   

## 2022-10-28 ENCOUNTER — Other Ambulatory Visit (INDEPENDENT_AMBULATORY_CARE_PROVIDER_SITE_OTHER): Payer: HMO

## 2022-10-28 DIAGNOSIS — R739 Hyperglycemia, unspecified: Secondary | ICD-10-CM | POA: Diagnosis not present

## 2022-10-28 DIAGNOSIS — I10 Essential (primary) hypertension: Secondary | ICD-10-CM | POA: Diagnosis not present

## 2022-10-28 DIAGNOSIS — E7800 Pure hypercholesterolemia, unspecified: Secondary | ICD-10-CM | POA: Diagnosis not present

## 2022-10-28 LAB — LIPID PANEL
Cholesterol: 153 mg/dL (ref 0–200)
HDL: 38.6 mg/dL — ABNORMAL LOW (ref >39.00)
LDL Cholesterol: 80 mg/dL (ref 0–99)
NonHDL: 114.65
Total CHOL/HDL Ratio: 4
Triglycerides: 171 mg/dL — ABNORMAL HIGH (ref 0.0–149.0)
VLDL: 34.2 mg/dL (ref 0.0–40.0)

## 2022-10-28 LAB — HEPATIC FUNCTION PANEL
ALT: 17 U/L (ref 0–35)
AST: 24 U/L (ref 0–37)
Albumin: 4 g/dL (ref 3.5–5.2)
Alkaline Phosphatase: 107 U/L (ref 39–117)
Bilirubin, Direct: 0.1 mg/dL (ref 0.0–0.3)
Total Bilirubin: 0.5 mg/dL (ref 0.2–1.2)
Total Protein: 6.5 g/dL (ref 6.0–8.3)

## 2022-10-28 LAB — BASIC METABOLIC PANEL
BUN: 40 mg/dL — ABNORMAL HIGH (ref 6–23)
CO2: 23 mEq/L (ref 19–32)
Calcium: 9.8 mg/dL (ref 8.4–10.5)
Chloride: 106 mEq/L (ref 96–112)
Creatinine, Ser: 1.97 mg/dL — ABNORMAL HIGH (ref 0.40–1.20)
GFR: 23.42 mL/min — ABNORMAL LOW (ref >60.00)
Glucose, Bld: 91 mg/dL (ref 70–99)
Potassium: 5.3 mEq/L — ABNORMAL HIGH (ref 3.5–5.1)
Sodium: 136 mEq/L (ref 135–145)

## 2022-10-28 LAB — HEMOGLOBIN A1C: Hgb A1c MFr Bld: 5.8 % (ref 4.6–6.5)

## 2022-10-29 DIAGNOSIS — E875 Hyperkalemia: Secondary | ICD-10-CM | POA: Diagnosis not present

## 2022-10-29 DIAGNOSIS — I10 Essential (primary) hypertension: Secondary | ICD-10-CM | POA: Diagnosis not present

## 2022-10-29 DIAGNOSIS — N1832 Chronic kidney disease, stage 3b: Secondary | ICD-10-CM | POA: Diagnosis not present

## 2022-10-30 ENCOUNTER — Encounter: Payer: Self-pay | Admitting: Internal Medicine

## 2022-10-30 ENCOUNTER — Ambulatory Visit (INDEPENDENT_AMBULATORY_CARE_PROVIDER_SITE_OTHER): Payer: HMO | Admitting: Internal Medicine

## 2022-10-30 VITALS — BP 130/70 | HR 72 | Temp 97.9°F | Resp 16 | Ht 64.0 in | Wt 146.4 lb

## 2022-10-30 DIAGNOSIS — E7800 Pure hypercholesterolemia, unspecified: Secondary | ICD-10-CM

## 2022-10-30 DIAGNOSIS — I4891 Unspecified atrial fibrillation: Secondary | ICD-10-CM | POA: Diagnosis not present

## 2022-10-30 DIAGNOSIS — N184 Chronic kidney disease, stage 4 (severe): Secondary | ICD-10-CM

## 2022-10-30 DIAGNOSIS — K219 Gastro-esophageal reflux disease without esophagitis: Secondary | ICD-10-CM | POA: Diagnosis not present

## 2022-10-30 DIAGNOSIS — I700 Atherosclerosis of aorta: Secondary | ICD-10-CM

## 2022-10-30 DIAGNOSIS — I10 Essential (primary) hypertension: Secondary | ICD-10-CM | POA: Diagnosis not present

## 2022-10-30 DIAGNOSIS — N390 Urinary tract infection, site not specified: Secondary | ICD-10-CM

## 2022-10-30 DIAGNOSIS — D696 Thrombocytopenia, unspecified: Secondary | ICD-10-CM

## 2022-10-30 DIAGNOSIS — I351 Nonrheumatic aortic (valve) insufficiency: Secondary | ICD-10-CM | POA: Diagnosis not present

## 2022-10-30 DIAGNOSIS — R748 Abnormal levels of other serum enzymes: Secondary | ICD-10-CM | POA: Diagnosis not present

## 2022-10-30 DIAGNOSIS — K760 Fatty (change of) liver, not elsewhere classified: Secondary | ICD-10-CM | POA: Diagnosis not present

## 2022-10-30 DIAGNOSIS — E875 Hyperkalemia: Secondary | ICD-10-CM

## 2022-10-30 DIAGNOSIS — R1310 Dysphagia, unspecified: Secondary | ICD-10-CM | POA: Diagnosis not present

## 2022-10-30 DIAGNOSIS — R918 Other nonspecific abnormal finding of lung field: Secondary | ICD-10-CM

## 2022-10-30 DIAGNOSIS — R739 Hyperglycemia, unspecified: Secondary | ICD-10-CM | POA: Diagnosis not present

## 2022-10-30 DIAGNOSIS — E039 Hypothyroidism, unspecified: Secondary | ICD-10-CM

## 2022-10-30 DIAGNOSIS — F439 Reaction to severe stress, unspecified: Secondary | ICD-10-CM

## 2022-10-30 MED ORDER — ATORVASTATIN CALCIUM 20 MG PO TABS: 20.0000 mg | ORAL_TABLET | Freq: Every day | ORAL | 3 refills | Status: DC

## 2022-10-30 MED ORDER — PANTOPRAZOLE SODIUM 40 MG PO TBEC: 40.0000 mg | DELAYED_RELEASE_TABLET | Freq: Every day | ORAL | 3 refills | Status: DC

## 2022-10-30 MED ORDER — LEVOTHYROXINE SODIUM 75 MCG PO TABS: ORAL_TABLET | ORAL | 3 refills | Status: DC

## 2022-10-30 NOTE — Progress Notes (Unsigned)
Subjective:    Patient ID: Caitlin Lin, female    DOB: 1940-09-15, 82 y.o.   MRN: [id number]  Patient here for  Chief Complaint  Patient presents with   Medical Management of Chronic Issues    HPI Here for follow up - hypercholesterolemia, CKD and hypertension.  Hospitalized 06/18/22 - 06/21/22 - after presenting with flank pain and increased urinary frequency. Was found to have new onset afib with RVR (up to 186). Was given IV cardizem - followed by oral cardizem. CXR negative. Dr Kirke Corin consulted. Found to have UTI - E.coli pan sensitive. IV abx and discharged on cipro for 10 days total abx. Not started on anticoagulants. Zio monitor placed. Discharged on metoprolol.  Had f/u with cardiology 08/21/22. No further arrhythmias noted. Previous myoview - no ischemia.  Echo with normal EF.  Recommended continuing aspirin and statin. Hold anticoagulation - lone afib in the setting of UTI sepsis.  Saw ortho 07/16/22 - low back pain. Xray - advanced degenerative scoliotic change and degenerative disc findings with facet arthropathy and spondylosis. S/p medial branch block. PT.  Had f/u with urology 10/05/22 - mixed incontinence. Cystoscopy ok.  Started on trimethoprim.  Evaluated by me 10/08/22 - increased fatigue. Saw cardiology 10/14/22 - f/u low blood pressure and increased heart rate.  Was feeling better.  Amlodipine restarted given increase in blood pressure at that visit. States blood pressure has been doing well and she has not taken amlodipine for the past week. Saw nephrology 8/15.  No changes made.  Reports no change as long as potassium remaining below 5.5.  does report some acid reflux at times.  Also reports feeling, when she eats, that food does not want to got down.  Called GI.  Has appt 12/2022.      Past Medical History:  Diagnosis Date   Cataracts, bilateral    Chronic kidney disease    Followed by Dr. Cherylann Ratel   Chronic kidney disease    Colon polyp    Constipation due to slow transit     Diarrhea    Diarrhea    GERD (gastroesophageal reflux disease)    Heart murmur    History of cyst of breast    History of hiatal hernia    Hyperlipidemia    Hypertension    Hypothyroidism    Melanoma of lower leg (HCC) 2013   Shingles 2013   Shingles    Spinal stenosis in cervical region    Dr. Yves Dill   Subdural hematoma [facility])    Thyroid disease    Past Surgical History:  Procedure Laterality Date   ABDOMINAL HYSTERECTOMY  1974   ANTERIOR AND POSTERIOR VAGINAL REPAIR     ANTERIOR AND POSTERIOR VAGINAL REPAIR W/ SACROSPINOUS LIGAMENT SUSPENSION Right    BREAST BIOPSY  1963   Benign per pt's report   BREAST EXCISIONAL BIOPSY Right 1963   neg   COLONOSCOPY     COLONOSCOPY N/A 10/29/2021   Procedure: COLONOSCOPY;  Surgeon: Toledo, Boykin Nearing, MD;  Location: [facility] ENDOSCOPY;  Service: Gastroenterology;  Laterality: N/A;   ELECTROMAGNETIC NAVIGATION BROCHOSCOPY Right 01/09/2019   Procedure: ELECTROMAGNETIC NAVIGATION BRONCHOSCOPY;  Surgeon: Salena Saner, MD;  Location: [facility] ORS;  Service: Cardiopulmonary;  Laterality: Right;   ESOPHAGOGASTRODUODENOSCOPY     ESOPHAGOGASTRODUODENOSCOPY (EGD) WITH PROPOFOL N/A 11/10/2016   Procedure: ESOPHAGOGASTRODUODENOSCOPY (EGD) WITH PROPOFOL;  Surgeon: Christena Deem, MD;  Location[facility] END[facility]COPY;  Service: Endoscopy;  Laterality: N/A;   ESOPHAGOGASTRODUODENOSCOPY (EGD) WITH PROPOFOL N/A 09/09/2018  Procedure: ESOPHAGOGASTRODUODENOSCOPY (EGD) WITH PROPOFOL;  Surgeon: Christena Deem, MD;  Location: [facility] ENDOSCOPY;  Service: Endoscopy;  Laterality: N/A;   ESOPHAGOGASTRODUODENOSCOPY (EGD) WITH PROPOFOL N/A 10/27/2019   Procedure: ESOPHAGOGASTRODUODENOSCOPY (EGD) WITH PROPOFOL;  Surgeon: Earline Mayotte, MD;  Location: [facility] ENDOSCOPY;  Service: Endoscopy;  Laterality: N/A;   ESOPHAGOGASTRODUODENOSCOPY (EGD) WITH PROPOFOL N/A 08/09/2020   Procedure: ESOPHAGOGASTRODUODENOSCOPY (EGD) WITH PROPOFOL;  Surgeon: Regis Bill, MD;   Location: [facility] ENDOSCOPY;  Service: Endoscopy;  Laterality: N/A;   LUNG BIOPSY  2007   Benign per pt's report   LUNG BIOPSY Right    OOPHORECTOMY  1970   PERINEOPLASTY  12/17/2013   sling for stress incontinence  12/18/2011   Family History  Problem Relation Age of Onset   Arthritis Mother    Liver disease Mother    Cirrhosis Mother    Liver cancer Mother    Heart disease Father    Heart attack Father    Coronary artery disease Father    Depression Brother    Prostate cancer Brother    Diabetes Maternal Aunt    Breast cancer Cousin        maternal cousins   Liver cancer Cousin    Breast cancer Other    Social History   Socioeconomic History   Marital status: Married    Spouse name: Not on file   Number of children: 2   Years of education: Not on file   Highest education level: Not on file  Occupational History   Occupation: Retired  Tobacco Use   Smoking status: Never   Smokeless tobacco: Never  Vaping Use   Vaping status: Never Used  Substance and Sexual Activity   Alcohol use: No   Drug use: No   Sexual activity: Yes  Other Topics Concern   Not on file  Social History Narrative   Lives in [place] with husband. Has 2 children boy and girl, 4 grandchildren. Retired Insurance underwriter.       Daily Caffeine Use:  NO   Regular Exercise -  Not at this time due to back pain, would like to resume soon   Social Determinants of Health   Financial Resource Strain: Low Risk  (11/26/2021)   Overall Financial Resource Strain (CARDIA)    Difficulty of Paying Living Expenses: Not hard at all  Food Insecurity: No Food Insecurity (06/24/2022)   Hunger Vital Sign    Worried About Running Out of Food in the Last Year: Never true    Ran Out of Food in the Last Year: Never true  Transportation Needs: No Transportation Needs (06/24/2022)   PRAPARE - Administrator, Civil Service (Medical): No    Lack of Transportation (Non-Medical): No  Physical Activity: Insufficiently  Active (11/26/2021)   Exercise Vital Sign    Days of Exercise per Week: 4 days    Minutes of Exercise per Session: 20 min  Stress: No Stress Concern Present (11/26/2021)   [employer] of Occupational Health - Occupational Stress Questionnaire    Feeling of Stress : Not at all  Social Connections: Unknown (11/26/2021)   Social Connection and Isolation Panel [NHANES]    Frequency of Communication with Friends and Family: Not on file    Frequency of Social Gatherings with Friends and Family: Not on file    Attends Religious Services: Not on file    Active Member of Clubs or Organizations: Not on file    Attends Banker Meetings: Not on  file    Marital Status: Married     Review of Systems  Constitutional:  Negative for appetite change and unexpected weight change.  HENT:  Negative for congestion and sinus pressure.   Respiratory:  Negative for cough, chest tightness and shortness of breath.   Cardiovascular:  Negative for chest pain, palpitations and leg swelling.  Gastrointestinal:  Negative for diarrhea, nausea and vomiting.       GI symptoms as outlined.   Genitourinary:  Negative for difficulty urinating and dysuria.  Musculoskeletal:  Negative for joint swelling and myalgias.  Skin:  Negative for color change and rash.  Neurological:  Negative for dizziness and headaches.  Psychiatric/Behavioral:  Negative for agitation and dysphoric mood.        Objective:     BP 130/70   Pulse 72   Temp 97.9 F (36.6 C)   Resp 16   Ht 5\' 4"  (1.626 m)   Wt 146 lb 6.4 oz (66.4 kg)   SpO2 98%   BMI 25.13 kg/m  Wt Readings from Last 3 Encounters:  10/30/22 146 lb 6.4 oz (66.4 kg)  10/14/22 148 lb 6.4 oz (67.3 kg)  10/08/22 150 lb (68 kg)    Physical Exam Vitals reviewed.  Constitutional:      General: She is not in acute distress.    Appearance: Normal appearance.  HENT:     Head: Normocephalic and atraumatic.     Right Ear: External ear normal.     Left  Ear: External ear normal.  Eyes:     General: No scleral icterus.       Right eye: No discharge.        Left eye: No discharge.     Conjunctiva/sclera: Conjunctivae normal.  Neck:     Thyroid: No thyromegaly.  Cardiovascular:     Rate and Rhythm: Normal rate and regular rhythm.  Pulmonary:     Effort: No respiratory distress.     Breath sounds: Normal breath sounds. No wheezing.  Abdominal:     General: Bowel sounds are normal.     Palpations: Abdomen is soft.     Tenderness: There is no abdominal tenderness.  Musculoskeletal:        General: No swelling or tenderness.     Cervical back: Neck supple. No tenderness.  Lymphadenopathy:     Cervical: No cervical adenopathy.  Skin:    Findings: No erythema or rash.  Neurological:     Mental Status: She is alert.  Psychiatric:        Mood and Affect: Mood normal.        Behavior: Behavior normal.      Outpatient Encounter Medications as of 10/30/2022  Medication Sig   Alpha-Lipoic Acid 200 MG TABS Take 200 mg by mouth 3 (three) times daily.   amLODipine (NORVASC) 5 MG tablet TAKE ONE TABLET BY MOUTH EVERY DAY   aspirin 81 MG tablet Take 1 tablet (81 mg total) by mouth daily.   atorvastatin (LIPITOR) 20 MG tablet Take 1 tablet (20 mg total) by mouth daily.   Cholecalciferol 25 MCG (1000 UT) tablet Take 1,000 Units by mouth daily.   Docusate Calcium (STOOL SOFTENER PO) Take by mouth.   Glucosamine-Chondroit-Vit C-Mn (GLUCOSAMINE CHONDR 500 COMPLEX PO) Take 2 tablets by mouth daily.   levothyroxine (SYNTHROID) 75 MCG tablet TAKE 1 TABLET BY MOUTH ONCE DAILY IN THE MORNING BEFORE BREAKFAST   Multiple Vitamin (MULTIVITAMIN) tablet Take 1 tablet by mouth daily.   Omega-3  Fatty Acids (FISH OIL) 1000 MG CAPS Take 1,000 mg by mouth daily.   pantoprazole (PROTONIX) 40 MG tablet Take 1 tablet (40 mg total) by mouth daily. Take 30 minutes before breakfast   Polyethylene Glycol 3350 (MIRALAX PO) Take by mouth as needed.   Probiotic Product  (PROBIOTIC DAILY PO) Take by mouth daily.   telmisartan (MICARDIS) 20 MG tablet Take 1 tablet by mouth daily.   trimethoprim (TRIMPEX) 100 MG tablet Take 1 tablet (100 mg total) by mouth daily.   Wheat Dextrin (BENEFIBER PO) Take by mouth.   [DISCONTINUED] atorvastatin (LIPITOR) 20 MG tablet TAKE ONE TABLET BY MOUTH EVERY DAY   [DISCONTINUED] fluticasone (FLONASE) 50 MCG/ACT nasal spray  (Patient not taking: Reported on 10/14/2022)   [DISCONTINUED] levothyroxine (SYNTHROID) 75 MCG tablet TAKE 1 TABLET BY MOUTH ONCE DAILY IN THE MORNING BEFORE BREAKFAST   [DISCONTINUED] pantoprazole (PROTONIX) 40 MG tablet Take 1 tablet (40 mg total) by mouth daily. Take 30 minutes before breakfast   No facility-administered encounter medications on file as of 10/30/2022.     Lab Results  Component Value Date   WBC 4.7 10/08/2022   HGB 12.3 10/08/2022   HCT 38.0 10/08/2022   PLT 199.0 10/08/2022   GLUCOSE 91 10/28/2022   CHOL 153 10/28/2022   TRIG 171.0 (H) 10/28/2022   HDL 38.60 (L) 10/28/2022   LDLDIRECT 80.0 01/05/2022   LDLCALC 80 10/28/2022   ALT 17 10/28/2022   AST 24 10/28/2022   NA 136 10/28/2022   K 5.3 No hemolysis seen (H) 10/28/2022   CL 106 10/28/2022   CREATININE 1.97 (H) 10/28/2022   BUN 40 (H) 10/28/2022   CO2 23 10/28/2022   TSH 0.88 10/08/2022   INR 1.1 06/18/2022   HGBA1C 5.8 10/28/2022   MICROALBUR 2.1 (H) 03/27/2015       Assessment & Plan:  Hyperkalemia Assessment & Plan:  Saw nephrology 8/15.  No changes made.  Reports no change as long as potassium remaining below 5.5.  recheck potassium in a few weeks to confirm stable.   Orders: -     Potassium  Aortic atherosclerosis (HCC) Assessment & Plan: Continue lipitor.    Atrial fibrillation, unspecified type [facility]) Assessment & Plan: Was found to have new onset afib with RVR (up to 186) - during recent hospitalization. Was given IV cardizem - followed by oral cardizem.  CXR negative.  Dr Kirke Corin consulted. Found to  have UTI - E.coli pan sensitive.  IV abx and discharged on cipro for 10 days total abx.  Not started on anticoagulants.  Was felt afib - related to acute illness.  Zio monitor placed.  Discharged on metoprolol. Had f/u with cardiology 08/21/22. No further arrhythmias noted. Previous myoview - no ischemia.  Echo with normal EF.  Recommended continuing aspirin and statin. Hold anticoagulation - lone afib in the setting of UTI sepsis.  Overall doing well.  Denies any increased heart rate or palpitations.    CKD (chronic kidney disease) stage 4, GFR 15-29 ml/min [facility]) Assessment & Plan: Seeing Dr Eulah Pont [facility] nephrology) - 8/15.  No changes made.  Reports no change as long as potassium remaining below 5.5.    Thrombocytopenia (HCC) Assessment & Plan: Follow cbc.    Aortic valve insufficiency, etiology of cardiac valve disease unspecified Assessment & Plan: ECHO - aortic regurgitation.  Saw cardiology.  Coronary artery calcifications on noncontrast chest CT. Previous echocardiogram with normal systolic and diastolic function.  Lexiscan Myoview 03/2020 with no significant ischemia, low risk.  Continue aspirin, statin.  Saw cardiology 10/14/22 - f/u low blood pressure and increased heart rate.  Was feeling better.     Dysphagia, unspecified type Assessment & Plan: EGD - 08/09/20 - mild reactive gastropathy, fundic gland polyps, mucosa with edema and congestion. Does report some acid reflux at times.  Also reports feeling, when she eats, that food does not want to got down.  Called GI.  Has appt 12/2022.  Discussed today.  Continue protonix as she is doing.  Add pepcid.  D/w GI regarding earlier f/u appt.    Elevated alkaline phosphatase level Assessment & Plan: Follow liver panel.  Previous GGT wnl.  Intact PTH and vitamin D wnl.     Fatty liver Assessment & Plan: Follow liver panel.    Frequent UTI Assessment & Plan:  Had f/u with urology 10/05/22 - mixed incontinence. Cystoscopy ok.  Started  on trimethoprim.    Gastroesophageal reflux disease without esophagitis Assessment & Plan: EGD - 08/09/20 - mild reactive gastropathy, fundic gland polyps, mucosa with edema and congestion. Does report some acid reflux at times.  Also reports feeling, when she eats, that food does not want to got down.  Called GI.  Has appt 12/2022.  Discussed today.  Continue protonix as she is doing.  Add pepcid.  D/w GI regarding earlier f/u appt.    Hypercholesterolemia Assessment & Plan: Continue lipitor.  Low cholesterol diet and exercise.  Follow lipid panel and liver function tests.     Hyperglycemia Assessment & Plan: Low carb diet and exercise.  Follow met b and a1c.     Primary hypertension Assessment & Plan: Blood pressure as outlined. On telmisartan.  Reports blood pressure doing well recently.  Off amlodipine for one week. Follow pressures.  Follow metabolic panel. Spot check pressures and send in readings.    Hypothyroidism, unspecified type Assessment & Plan: On thyroid replacement.  Follow tsh.    Lung nodules Assessment & Plan: Had f/u with Dr Jayme Cloud 03/2022.  F/u Chest CT shows that the previous area of concern completely resolved.  She does not need any more chest CTs.    Stress Assessment & Plan: On zoloft and doing well on this medication.  Continue zoloft.  Follow.    Other orders -     Atorvastatin Calcium; Take 1 tablet (20 mg total) by mouth daily.  Dispense: 90 tablet; Refill: 3 -     Levothyroxine Sodium; TAKE 1 TABLET BY MOUTH ONCE DAILY IN THE MORNING BEFORE BREAKFAST  Dispense: 90 tablet; Refill: 3 -     Pantoprazole Sodium; Take 1 tablet (40 mg total) by mouth daily. Take 30 minutes before breakfast  Dispense: 90 tablet; Refill: 3     Dale Sabana Eneas, MD

## 2022-10-30 NOTE — Patient Instructions (Signed)
Pepcid (famotidine) - 20mg  - take one tablet 30 minutes before evening meal.

## 2022-10-31 ENCOUNTER — Encounter: Payer: Self-pay | Admitting: Internal Medicine

## 2022-10-31 NOTE — Assessment & Plan Note (Signed)
Follow liver panel.  

## 2022-10-31 NOTE — Assessment & Plan Note (Signed)
Was found to have new onset afib with RVR (up to 186) - during recent hospitalization. Was given IV cardizem - followed by oral cardizem.  CXR negative.  Dr Kirke Corin consulted. Found to have UTI - E.coli pan sensitive.  IV abx and discharged on cipro for 10 days total abx.  Not started on anticoagulants.  Was felt afib - related to acute illness.  Zio monitor placed.  Discharged on metoprolol. Had f/u with cardiology 08/21/22. No further arrhythmias noted. Previous myoview - no ischemia.  Echo with normal EF.  Recommended continuing aspirin and statin. Hold anticoagulation - lone afib in the setting of UTI sepsis.  Overall doing well.  Denies any increased heart rate or palpitations.

## 2022-10-31 NOTE — Assessment & Plan Note (Signed)
On thyroid replacement.  Follow tsh.  

## 2022-10-31 NOTE — Assessment & Plan Note (Addendum)
EGD - 08/09/20 - mild reactive gastropathy, fundic gland polyps, mucosa with edema and congestion. Does report some acid reflux at times.  Also reports feeling, when she eats, that food does not want to got down.  Called GI.  Has appt 12/2022.  Discussed today.  Continue protonix as she is doing.  Add pepcid.  D/w GI regarding earlier f/u appt.

## 2022-10-31 NOTE — Assessment & Plan Note (Signed)
Continue lipitor.  Low cholesterol diet and exercise.  Follow lipid panel and liver function tests.   

## 2022-10-31 NOTE — Assessment & Plan Note (Signed)
Continue lipitor  ?

## 2022-10-31 NOTE — Assessment & Plan Note (Signed)
Low carb diet and exercise.  Follow met b and a1c.   

## 2022-10-31 NOTE — Assessment & Plan Note (Signed)
EGD - 08/09/20 - mild reactive gastropathy, fundic gland polyps, mucosa with edema and congestion. Does report some acid reflux at times.  Also reports feeling, when she eats, that food does not want to got down.  Called GI.  Has appt 12/2022.  Discussed today.  Continue protonix as she is doing.  Add pepcid.  D/w GI regarding earlier f/u appt.

## 2022-10-31 NOTE — Assessment & Plan Note (Signed)
ECHO - aortic regurgitation.  Saw cardiology.  Coronary artery calcifications on noncontrast chest CT. Previous echocardiogram with normal systolic and diastolic function.  Lexiscan Myoview 03/2020 with no significant ischemia, low risk.  Continue aspirin, statin.  Saw cardiology 10/14/22 - f/u low blood pressure and increased heart rate.  Was feeling better.

## 2022-10-31 NOTE — Assessment & Plan Note (Signed)
 Had f/u with Dr Jayme Cloud 03/2022.  F/u Chest CT shows that the previous area of concern completely resolved.  She does not need any more chest CTs.

## 2022-10-31 NOTE — Assessment & Plan Note (Signed)
Follow liver panel.  Previous GGT wnl.  Intact PTH and vitamin D wnl.

## 2022-10-31 NOTE — Assessment & Plan Note (Signed)
Seeing Dr Eulah Pont [facility] nephrology) - 8/15.  No changes made.  Reports no change as long as potassium remaining below 5.5.

## 2022-10-31 NOTE — Assessment & Plan Note (Signed)
On zoloft and doing well on this medication.  Continue zoloft.  Follow.

## 2022-10-31 NOTE — Assessment & Plan Note (Signed)
Saw nephrology 8/15.  No changes made.  Reports no change as long as potassium remaining below 5.5.  recheck potassium in a few weeks to confirm stable.

## 2022-10-31 NOTE — Assessment & Plan Note (Signed)
Follow cbc.  

## 2022-10-31 NOTE — Assessment & Plan Note (Signed)
Had f/u with urology 10/05/22 - mixed incontinence. Cystoscopy ok.  Started on trimethoprim.

## 2022-10-31 NOTE — Assessment & Plan Note (Signed)
Blood pressure as outlined. On telmisartan.  Reports blood pressure doing well recently.  Off amlodipine for one week. Follow pressures.  Follow metabolic panel. Spot check pressures and send in readings.

## 2022-11-04 DIAGNOSIS — M47816 Spondylosis without myelopathy or radiculopathy, lumbar region: Secondary | ICD-10-CM | POA: Diagnosis not present

## 2022-11-10 ENCOUNTER — Ambulatory Visit
Admission: RE | Admit: 2022-11-10 | Discharge: 2022-11-10 | Disposition: A | Discharge status: Home / Self Care | Payer: HMO | Source: Ambulatory Visit | Attending: Internal Medicine | Admitting: Internal Medicine

## 2022-11-10 DIAGNOSIS — Z1231 Encounter for screening mammogram for malignant neoplasm of breast: Secondary | ICD-10-CM | POA: Insufficient documentation

## 2022-11-13 ENCOUNTER — Telehealth: Payer: Self-pay

## 2022-11-13 ENCOUNTER — Other Ambulatory Visit
Admission: RE | Admit: 2022-11-13 | Discharge: 2022-11-13 | Disposition: A | Discharge status: Home / Self Care | Payer: HMO | Attending: Internal Medicine | Admitting: Internal Medicine

## 2022-11-13 ENCOUNTER — Telehealth: Payer: Self-pay | Admitting: Internal Medicine

## 2022-11-13 ENCOUNTER — Other Ambulatory Visit: Payer: Self-pay

## 2022-11-13 DIAGNOSIS — E875 Hyperkalemia: Secondary | ICD-10-CM

## 2022-11-13 LAB — POTASSIUM: Potassium: 6 mmol/L — ABNORMAL HIGH (ref 3.5–5.1)

## 2022-11-13 MED ORDER — VELTASSA 8.4 G PO PACK: 8.4000 g | PACK | Freq: Every day | ORAL | 0 refills | Status: DC

## 2022-11-13 NOTE — Telephone Encounter (Signed)
Dr Lorin Picket left message for [facility] to return call.

## 2022-11-13 NOTE — Telephone Encounter (Signed)
Patient just called. She said she just went to her pharmacy to see if she could pick up her Potassium med. They told her it won't be ready til Tuesday. She would like for someone to call her. Her number is [phone].

## 2022-11-16 ENCOUNTER — Other Ambulatory Visit
Admission: RE | Admit: 2022-11-16 | Discharge: 2022-11-16 | Disposition: A | Discharge status: Home / Self Care | Payer: HMO | Attending: Internal Medicine | Admitting: Internal Medicine

## 2022-11-16 DIAGNOSIS — E875 Hyperkalemia: Secondary | ICD-10-CM | POA: Diagnosis not present

## 2022-11-16 LAB — POTASSIUM: Potassium: 4.4 mmol/L (ref 3.5–5.1)

## 2022-11-17 ENCOUNTER — Other Ambulatory Visit: Payer: Self-pay

## 2022-11-17 DIAGNOSIS — E875 Hyperkalemia: Secondary | ICD-10-CM

## 2022-11-17 NOTE — Telephone Encounter (Signed)
Spoke with patient. She was able to get her valtessa and start Friday night.

## 2022-11-23 ENCOUNTER — Other Ambulatory Visit
Admission: RE | Admit: 2022-11-23 | Discharge: 2022-11-23 | Disposition: A | Discharge status: Home / Self Care | Payer: HMO | Attending: Internal Medicine | Admitting: Internal Medicine

## 2022-11-23 DIAGNOSIS — E875 Hyperkalemia: Secondary | ICD-10-CM | POA: Diagnosis not present

## 2022-11-23 LAB — POTASSIUM: Potassium: 4.7 mmol/L (ref 3.5–5.1)

## 2022-11-25 ENCOUNTER — Telehealth: Payer: Self-pay | Admitting: Internal Medicine

## 2022-11-25 NOTE — Telephone Encounter (Signed)
Ms Caitlin Lin had reported increased problems with dysphagia.  She sees [facility].  Has seen Liborio Nixon previously.  She has an appt in 12/2022.  Anyway they could see her for earlier appt.  Thanks

## 2022-11-26 NOTE — Telephone Encounter (Signed)
Per Megan at GI, no sooner appt with Liborio Nixon. Has appt 12/16/22. Pt placed on cancellation list.

## 2022-11-27 ENCOUNTER — Ambulatory Visit: Payer: HMO | Admitting: Nurse Practitioner

## 2022-11-27 ENCOUNTER — Other Ambulatory Visit: Payer: Self-pay

## 2022-11-27 ENCOUNTER — Telehealth: Payer: Self-pay | Admitting: Internal Medicine

## 2022-11-27 ENCOUNTER — Emergency Department: Payer: HMO

## 2022-11-27 ENCOUNTER — Emergency Department
Admission: EM | Admit: 2022-11-27 | Discharge: 2022-11-27 | Disposition: A | Discharge status: Home / Self Care | Payer: HMO | Attending: Emergency Medicine | Admitting: Emergency Medicine

## 2022-11-27 DIAGNOSIS — E039 Hypothyroidism, unspecified: Secondary | ICD-10-CM | POA: Diagnosis not present

## 2022-11-27 DIAGNOSIS — I10 Essential (primary) hypertension: Secondary | ICD-10-CM | POA: Diagnosis not present

## 2022-11-27 DIAGNOSIS — N189 Chronic kidney disease, unspecified: Secondary | ICD-10-CM | POA: Diagnosis not present

## 2022-11-27 DIAGNOSIS — I129 Hypertensive chronic kidney disease with stage 1 through stage 4 chronic kidney disease, or unspecified chronic kidney disease: Secondary | ICD-10-CM | POA: Insufficient documentation

## 2022-11-27 DIAGNOSIS — R42 Dizziness and giddiness: Secondary | ICD-10-CM | POA: Diagnosis not present

## 2022-11-27 LAB — CBC
HCT: 34.4 % — ABNORMAL LOW (ref 36.0–46.0)
Hemoglobin: 11.4 g/dL — ABNORMAL LOW (ref 12.0–15.0)
MCH: 29.7 pg (ref 26.0–34.0)
MCHC: 33.1 g/dL (ref 30.0–36.0)
MCV: 89.6 fL (ref 80.0–100.0)
Platelets: 162 10*3/uL (ref 150–400)
RBC: 3.84 MIL/uL — ABNORMAL LOW (ref 3.87–5.11)
RDW: 13.4 % (ref 11.5–15.5)
WBC: 5.3 10*3/uL (ref 4.0–10.5)
nRBC: 0 % (ref 0.0–0.2)

## 2022-11-27 LAB — BASIC METABOLIC PANEL
Anion gap: 9 (ref 5–15)
BUN: 27 mg/dL — ABNORMAL HIGH (ref 8–23)
CO2: 21 mmol/L — ABNORMAL LOW (ref 22–32)
Calcium: 9.2 mg/dL (ref 8.9–10.3)
Chloride: 104 mmol/L (ref 98–111)
Creatinine, Ser: 1.39 mg/dL — ABNORMAL HIGH (ref 0.44–1.00)
GFR, Estimated: 38 mL/min — ABNORMAL LOW (ref >60)
Glucose, Bld: 99 mg/dL (ref 70–99)
Potassium: 4.8 mmol/L (ref 3.5–5.1)
Sodium: 134 mmol/L — ABNORMAL LOW (ref 135–145)

## 2022-11-27 LAB — URINALYSIS, ROUTINE W REFLEX MICROSCOPIC
Bilirubin Urine: NEGATIVE
Glucose, UA: NEGATIVE mg/dL
Hgb urine dipstick: NEGATIVE
Ketones, ur: NEGATIVE mg/dL
Leukocytes,Ua: NEGATIVE
Nitrite: NEGATIVE
Protein, ur: NEGATIVE mg/dL
Specific Gravity, Urine: 1.002 — ABNORMAL LOW (ref 1.005–1.030)
pH: 7 (ref 5.0–8.0)

## 2022-11-27 MED ORDER — ACETAMINOPHEN 325 MG PO TABS
650.0000 mg | ORAL_TABLET | Freq: Once | ORAL | Status: AC
  Administered 2022-11-27: 650 mg via ORAL
  Filled 2022-11-27: qty 2

## 2022-11-27 NOTE — Telephone Encounter (Signed)
Evaluated - in ER.

## 2022-11-27 NOTE — Telephone Encounter (Signed)
Pt is at urgent care

## 2022-11-27 NOTE — ED Triage Notes (Signed)
Pt comes with c/o dizziness and HTN. Pt states she has been off meds after was elevated. Pt states the doc wanted to see if that was what was causing it.. Pt states some fatigue also. Pt denies any sob or cp.

## 2022-11-27 NOTE — Telephone Encounter (Signed)
Access nurse called back and stated that the pt need to be seen within 24 hours and we did not have any appointments. Access nurse stated that the pt was talking to her provider and she took the pt off some medication. I talked to the pt and let her know that I had an appointment at 4pm today. Pt stated she will try to keep it but she feels so weak and dizzy. I told the pt I will cancel the appointment and she can got to urgent care if she would like. Pt stated she think it was best. Her husband will take her

## 2022-11-27 NOTE — ED Provider Notes (Signed)
[facility] Emergency Department Provider Note     Event Date/Time   First MD Initiated Contact with Patient 11/27/22 1623     (approximate)   History   Dizziness   HPI  Caitlin Lin is a 82 y.o. female with a history of HTN, CKD, HLD, hypothyroidism, GERD presents to the ED for evaluation of dizziness.  She was sent by her primary provider for evaluation of complaints of elevated blood pressure.  Patient has been off of her blood pressure meds at the advice of her PCP.     Physical Exam   Triage Vital Signs: ED Triage Vitals  Encounter Vitals Group     BP 11/27/22 1256 (!) 191/76     Systolic BP Percentile --      Diastolic BP Percentile --      Pulse Rate 11/27/22 1256 62     Resp 11/27/22 1256 18     Temp 11/27/22 1256 98.4 F (36.9 C)     Temp Source 11/27/22 1256 Oral     SpO2 11/27/22 1256 97 %     Weight --      Height --      Head Circumference --      Peak Flow --      Pain Score 11/27/22 1253 6     Pain Loc --      Pain Education --      Exclude from Growth Chart --     Most recent vital signs: Vitals:   11/27/22 1642 11/27/22 1714  BP: (!) 178/78 (!) 186/66  Pulse: 67 62  Resp:  16  Temp:  97.8 F (36.6 C)  SpO2: 98% 95%    General Awake, no distress. nad HEENT NCAT. PERRL. EOMI. No rhinorrhea. Mucous membranes are moist.  CV:  Good peripheral perfusion. RRR RESP:  Normal effort. CTA ABD:  No distention. Soft, nontender NEURO: Cranial nerves II to XII grossly intact.   ED Results / Procedures / Treatments   Labs (all labs ordered are listed, but only abnormal results are displayed) Labs Reviewed  BASIC METABOLIC PANEL - Abnormal; Notable for the following components:      Result Value   Sodium 134 (*)    CO2 21 (*)    BUN 27 (*)    Creatinine, Ser 1.39 (*)    GFR, Estimated 38 (*)    All other components within normal limits  CBC - Abnormal; Notable for the following components:   RBC 3.84 (*)     Hemoglobin 11.4 (*)    HCT 34.4 (*)    All other components within normal limits  URINALYSIS, ROUTINE W REFLEX MICROSCOPIC - Abnormal; Notable for the following components:   Color, Urine COLORLESS (*)    APPearance CLEAR (*)    Specific Gravity, Urine 1.002 (*)    All other components within normal limits  CBG MONITORING, ED   EKG  Vent. rate 60 BPM PR interval 198 ms QRS duration 64 ms QT/QTcB 402/402 ms P-R-T axes 42 15 21 Normal sinus rhythm Cannot rule out Anterior infarct , age undetermined Abnormal ECG When compared with ECG of 18-Jun-2022 14:53, PREVIOUS ECG IS PRESENT  RADIOLOGY  I personally viewed and evaluated these images as part of my medical decision making, as well as reviewing the written report by the radiologist.  ED Provider Interpretation: No acute findings  CT Head w/o CM  IMPRESSION: No acute intracranial abnormality.  PROCEDURES:  Critical Care performed:  No  Procedures   MEDICATIONS ORDERED IN ED: Medications  acetaminophen (TYLENOL) tablet 650 mg (650 mg Oral Given 11/27/22 1713)     IMPRESSION / MDM / ASSESSMENT AND PLAN / ED COURSE  I reviewed the triage vital signs and the nursing notes.                              Differential diagnosis includes, but is not limited to, intracranial hemorrhage, meningitis/encephalitis, previous head trauma, cavernous venous thrombosis, tension headache, temporal arteritis, migraine or migraine equivalent, idiopathic intracranial hypertension, and non-specific headache.  Patient's presentation is most consistent with acute complicated illness / injury requiring diagnostic workup.  Patient's diagnosis is consistent with dizziness and primary hypertension.  With reassuring exam and workup at this time.  No acute lab abnormalities, malignant arrhythmia, or evidence of acute intracranial process based on my interpretation of CT images.  Patient has had her BP meds withheld by her PCP to evaluate and  manage hyperkalemia.  She has noted intermittent elevated blood pressure reading since that time as well as some associated dizziness and mild headache.  No acute neuro muscle deficits on exam.  Patient will be discharged home with instructions to take OTC Tylenol as needed for headache pain relief her PCP for further medication management,. Patient is to follow up with her PCP for further medication management, as needed or otherwise directed. Patient is given ED precautions to return to the ED for any worsening or new symptoms.   FINAL CLINICAL IMPRESSION(S) / ED DIAGNOSES   Final diagnoses:  Dizziness  Primary hypertension     Rx / DC Orders   ED Discharge Orders     None        Note:  This document was prepared using Dragon voice recognition software and may include unintentional dictation errors.    Lissa Hoard, PA-C 11/28/22 1909    Minna Antis, MD 11/30/22 2246

## 2022-11-27 NOTE — Telephone Encounter (Signed)
Pt called stating yesterday her BP was 159/80 and today it is 179/85 and she is very dizzy. Sent to access nurse

## 2022-11-27 NOTE — Discharge Instructions (Signed)
Your exam, labs, EKG, and CT scan are normal and reassuring at this time.  UA does not show any evidence of infection.  Your symptoms may be a side effect to your slightly higher than normal blood pressure.  I would recommend monitoring your blood pressure as well as taking OTC Tylenol as needed for headache pain relief.  Rest, hydrate, and eat as appropriate.  Follow-up with your primary provider for ongoing evaluation and management.

## 2022-11-30 ENCOUNTER — Ambulatory Visit (INDEPENDENT_AMBULATORY_CARE_PROVIDER_SITE_OTHER): Payer: HMO | Admitting: Urology

## 2022-11-30 ENCOUNTER — Encounter: Payer: Self-pay | Admitting: Urology

## 2022-11-30 VITALS — BP 187/83 | HR 63 | Ht 64.0 in | Wt 146.0 lb

## 2022-11-30 DIAGNOSIS — N3020 Other chronic cystitis without hematuria: Secondary | ICD-10-CM | POA: Diagnosis not present

## 2022-11-30 DIAGNOSIS — N3946 Mixed incontinence: Secondary | ICD-10-CM | POA: Diagnosis not present

## 2022-11-30 LAB — URINALYSIS, COMPLETE
Bilirubin, UA: NEGATIVE
Glucose, UA: NEGATIVE
Ketones, UA: NEGATIVE
Nitrite, UA: NEGATIVE
Protein,UA: NEGATIVE
Specific Gravity, UA: <1.005 — ABNORMAL LOW (ref 1.005–1.030)
Urobilinogen, Ur: 0.2 mg/dL (ref 0.2–1.0)
pH, UA: 6 (ref 5.0–7.5)

## 2022-11-30 LAB — MICROSCOPIC EXAMINATION

## 2022-11-30 LAB — BLADDER SCAN AMB NON-IMAGING: Scan Result: 118

## 2022-11-30 NOTE — Progress Notes (Signed)
11/30/2022 9:36 AM   Caitlin Lin 13-Jun-1940 [id number]  Referring provider: Dale [place], MD [address] [place],  [place] [address]  No chief complaint on file.   HPI: I was consulted to assess the patient's recent bladder infection.  She was hospitalized with sepsis.  Discharge diagnoses stated pyelonephritis and chronic renal insufficiency stage IV with glomerular filtration rate less than 30 mL/min.  She also had atrial fibrillation.  She had E. coli in the urine and blood.  She was sent home with 10 days of ciprofloxacin.  She had a myocardial infarction as well.  It does not appear that she had a renal x-ray   At baseline she voids every 1-2 hours gets up once or twice a night.  She sometimes leaks with coughing sneezing.  Sometimes she has urge incontinence.  No bedwetting.  She is quite fastidious and wears 4-5 pads a day that are damp.  If she is out in public she can have a high-volume episode associated with urgency.  Flow was good.   It appears she had a bladder suspension at [facility] many years ago by Dr. Zenda Alpers     Patient has mixed incontinence.  She developed a recent bladder infection with sepsis.  I believe she has had 1 other since then but did not have a lot of symptoms.  Call if CT scan hematuria is abnormal.  Have her come back for cystoscopy.  I may or may not put her on prophylaxis.  Treatment goals for mixed incontinence and primary urge incontinence will be discussed.  Call if culture positive    Last culture positive.  CT scan within normal limits And has some nonspecific symptoms yesterday and she says she normally does not feel when she has cystitis with typical symptoms. Patient is also having some stool incontinence not associated with awareness after she has a bowel movement and wondered if it could be related On pelvic examination she had a small grade 3 cystocele that hinged over the bladder neck.  She had grade 1 hypermobility and  no stress incontinence after cystoscopy of the cystocele reduced.  Vaginal cuff distended from 8 or 9 cm to approximately 6 cm.  No posterior defect. Cystoscopy.  Patient underwent flexible cystoscopy.  She had very cloudy urine.  She had not voided and the urine was quite cloudy with a lot of white flecks.  She had diffuse cystitis cystica.  I looked around her bladder for many minutes and did not see any carcinoma.  Trigone within normal limits.  Patient appears to be having recurrent bladder infections.  Pathophysiology described.  Clinically infected today.  Based on last culture put her on ciprofloxacin 250 mg twice a day for 7 days and I will see her back in about 6 weeks on daily trimethoprim and see if her mixed incontinence and frequency down regulates.  I will check another residual urine volume.  Patient will discuss fecal incontinence which could be related with her soon coming  Today Frequency stable.  Last culture positive. Patient had a high potassium of 6.0 November 13, 2022.  Her physician stopped 2 of her blood pressures pills and the trimethoprim.  Clinically not infected today.  Still having ongoing issues with cardiac.  Most recent potassium 4.7         PMH: Past Medical History:  Diagnosis Date   Cataracts, bilateral    Chronic kidney disease    Followed by Dr. Cherylann Ratel   Chronic kidney  disease    Colon polyp    Constipation due to slow transit    Diarrhea    Diarrhea    GERD (gastroesophageal reflux disease)    Heart murmur    History of cyst of breast    History of hiatal hernia    Hyperlipidemia    Hypertension    Hypothyroidism    Melanoma of lower leg (HCC) 2013   Shingles 2013   Shingles    Spinal stenosis in cervical region    Dr. Yves Dill   Subdural hematoma [facility])    Thyroid disease     Surgical History: Past Surgical History:  Procedure Laterality Date   ABDOMINAL HYSTERECTOMY  1974   ANTERIOR AND POSTERIOR VAGINAL REPAIR     ANTERIOR AND  POSTERIOR VAGINAL REPAIR W/ SACROSPINOUS LIGAMENT SUSPENSION Right    BREAST BIOPSY  1963   Benign per pt's report   BREAST EXCISIONAL BIOPSY Right 1963   neg   COLONOSCOPY     COLONOSCOPY N/A 10/29/2021   Procedure: COLONOSCOPY;  Surgeon: Toledo, Boykin Nearing, MD;  Location: [facility] ENDOSCOPY;  Service: Gastroenterology;  Laterality: N/A;   ELECTROMAGNETIC NAVIGATION BROCHOSCOPY Right 01/09/2019   Procedure: ELECTROMAGNETIC NAVIGATION BRONCHOSCOPY;  Surgeon: Salena Saner, MD;  Location: [facility] ORS;  Service: Cardiopulmonary;  Laterality: Right;   ESOPHAGOGASTRODUODENOSCOPY     ESOPHAGOGASTRODUODENOSCOPY (EGD) WITH PROPOFOL N/A 11/10/2016   Procedure: ESOPHAGOGASTRODUODENOSCOPY (EGD) WITH PROPOFOL;  Surgeon: Christena Deem, MD;  Location: [facility] ENDOSCOPY;  Service: Endoscopy;  Laterality: N/A;   ESOPHAGOGASTRODUODENOSCOPY (EGD) WITH PROPOFOL N/A 09/09/2018   Procedure: ESOPHAGOGASTRODUODENOSCOPY (EGD) WITH PROPOFOL;  Surgeon: Christena Deem, MD;  Location: [facility] ENDOSCOPY;  Service: Endoscopy;  Laterality: N/A;   ESOPHAGOGASTRODUODENOSCOPY (EGD) WITH PROPOFOL N/A 10/27/2019   Procedure: ESOPHAGOGASTRODUODENOSCOPY (EGD) WITH PROPOFOL;  Surgeon: Earline Mayotte, MD;  Location: [facility] ENDOSCOPY;  Service: Endoscopy;  Laterality: N/A;   ESOPHAGOGASTRODUODENOSCOPY (EGD) WITH PROPOFOL N/A 08/09/2020   Procedure: ESOPHAGOGASTRODUODENOSCOPY (EGD) WITH PROPOFOL;  Surgeon: Regis Bill, MD;  Location: [facility] ENDOSCOPY;  Service: Endoscopy;  Laterality: N/A;   LUNG BIOPSY  2007   Benign per pt's report   LUNG BIOPSY Right    OOPHORECTOMY  1970   PERINEOPLASTY  12/17/2013   sling for stress incontinence  12/18/2011    Home Medications:  Allergies as of 11/30/2022       Reactions   Quinapril Other (See Comments)   Hyperkalemia   Codeine Rash        Medication List        Accurate as of November 30, 2022  9:36 AM. If you have any questions, ask your nurse or doctor.           Alpha-Lipoic Acid 200 MG Tabs Take 200 mg by mouth 3 (three) times daily.   amLODipine 5 MG tablet Commonly known as: NORVASC TAKE ONE TABLET BY MOUTH EVERY DAY   aspirin 81 MG tablet Take 1 tablet (81 mg total) by mouth daily.   atorvastatin 20 MG tablet Commonly known as: LIPITOR Take 1 tablet (20 mg total) by mouth daily.   BENEFIBER PO Take by mouth.   Cholecalciferol 25 MCG (1000 UT) tablet Take 1,000 Units by mouth daily.   Fish Oil 1000 MG Caps Take 1,000 mg by mouth daily.   GLUCOSAMINE CHONDR 500 COMPLEX PO Take 2 tablets by mouth daily.   levothyroxine 75 MCG tablet Commonly known as: SYNTHROID TAKE 1 TABLET BY MOUTH ONCE DAILY IN THE MORNING BEFORE BREAKFAST   MIRALAX  PO Take by mouth as needed.   multivitamin tablet Take 1 tablet by mouth daily.   pantoprazole 40 MG tablet Commonly known as: PROTONIX Take 1 tablet (40 mg total) by mouth daily. Take 30 minutes before breakfast   PROBIOTIC DAILY PO Take by mouth daily.   STOOL SOFTENER PO Take by mouth.   telmisartan 20 MG tablet Commonly known as: MICARDIS Take 1 tablet by mouth daily.   trimethoprim 100 MG tablet Commonly known as: TRIMPEX Take 1 tablet (100 mg total) by mouth daily.   Veltassa 8.4 g packet Generic drug: patiromer Take 1 packet (8.4 g total) by mouth daily.        Allergies:  Allergies  Allergen Reactions   Quinapril Other (See Comments)    Hyperkalemia   Codeine Rash    Family History: Family History  Problem Relation Age of Onset   Arthritis Mother    Liver disease Mother    Cirrhosis Mother    Liver cancer Mother    Heart disease Father    Heart attack Father    Coronary artery disease Father    Depression Brother    Prostate cancer Brother    Diabetes Maternal Aunt    Breast cancer Cousin        maternal cousins   Liver cancer Cousin    Breast cancer Other     Social History:  reports that she has never smoked. She has never used smokeless  tobacco. She reports that she does not drink alcohol and does not use drugs.  ROS:                                        Physical Exam: BP (!) 187/83   Pulse 63   Ht 5\' 4"  (1.626 m)   Wt 66.2 kg   BMI 25.06 kg/m   Constitutional:  Alert and oriented, No acute distress. HEENT: [place] AT, moist mucus membranes.  Trachea midline, no masses. 2 Laboratory Data: Lab Results  Component Value Date   WBC 5.3 11/27/2022   HGB 11.4 (L) 11/27/2022   HCT 34.4 (L) 11/27/2022   MCV 89.6 11/27/2022   PLT 162 11/27/2022    Lab Results  Component Value Date   CREATININE 1.39 (H) 11/27/2022    No results found for: "PSA"  No results found for: "TESTOSTERONE"  Lab Results  Component Value Date   HGBA1C 5.8 10/28/2022    Urinalysis    Component Value Date/Time   COLORURINE COLORLESS (A) 11/27/2022 1302   APPEARANCEUR CLEAR (A) 11/27/2022 1302   APPEARANCEUR Clear 10/05/2022 0853   LABSPEC 1.002 (L) 11/27/2022 1302   PHURINE 7.0 11/27/2022 1302   GLUCOSEU NEGATIVE 11/27/2022 1302   GLUCOSEU NEGATIVE 07/22/2018 0839   HGBUR NEGATIVE 11/27/2022 1302   BILIRUBINUR NEGATIVE 11/27/2022 1302   BILIRUBINUR Negative 10/05/2022 0853   KETONESUR NEGATIVE 11/27/2022 1302   PROTEINUR NEGATIVE 11/27/2022 1302   UROBILINOGEN 0.2 07/22/2018 0839   NITRITE NEGATIVE 11/27/2022 1302   LEUKOCYTESUR NEGATIVE 11/27/2022 1302    Pertinent Imaging: Urine no bacteria sent for culture  Assessment & Plan: Thought it was best to put her back on trimethoprim and review baseline symptoms in 4 months.  Recommended repeat potassium on trimethoprim by primary care  1. Mixed incontinence  - Urinalysis, Complete - BLADDER SCAN AMB NON-IMAGING   No follow-ups on file.  Martina Sinner, MD  [facility] [address] [place], [place] [address] [phone]

## 2022-12-01 ENCOUNTER — Ambulatory Visit (INDEPENDENT_AMBULATORY_CARE_PROVIDER_SITE_OTHER): Payer: HMO | Admitting: *Deleted

## 2022-12-01 VITALS — BP 114/72 | HR 84 | Ht 64.0 in | Wt 150.0 lb

## 2022-12-01 DIAGNOSIS — Z0000 Encounter for general adult medical examination without abnormal findings: Secondary | ICD-10-CM

## 2022-12-01 NOTE — Progress Notes (Signed)
Subjective:   Caitlin Lin is a 82 y.o. female who presents for Medicare Annual (Subsequent) preventive examination.  Visit Complete: Virtual  I connected with  Lonzo Cloud on 12/01/22 by a audio enabled telemedicine application and verified that I am speaking with the correct person using two identifiers.  Patient Location: Home  Provider Location: Home Office  I discussed the limitations of evaluation and management by telemedicine. The patient expressed understanding and agreed to proceed.  Vital Signs: Unable to obtain new vitals due to this being a telehealth visit. Patient was able to provide some vital signs that has been documented.   .Cardiac Risk Factors include: advanced age (>50men, >53 women);dyslipidemia;hypertension;Other (see comment), Risk factor comments: History of AFib     Objective:    Today's Vitals   12/01/22 1115  BP: 114/72  Pulse: 84  Weight: 150 lb (68 kg)  Height: 5\' 4"  (1.626 m)   Body mass index is 25.75 kg/m.     12/01/2022   11:32 AM 11/27/2022   12:55 PM 06/20/2022    9:11 AM 11/26/2021   10:18 AM 10/29/2021   12:20 PM 09/22/2021   10:28 AM 11/27/2020   12:12 PM  Advanced Directives  Does Patient Have a Medical Advance Directive? Yes No Yes Yes No;Yes No Yes  Type of Estate agent of [place];Living will  Living will Healthcare Power of [place];Living will Living will  Healthcare Power of [place];Living will  Does patient want to make changes to medical advance directive?   No - Patient declined No - Patient declined     Copy of Healthcare Power of Attorney in Chart? No - copy requested   Yes - validated most recent copy scanned in chart (See row information)     Would patient like information on creating a medical advance directive?      No - Patient declined     Current Medications (verified) Outpatient Encounter Medications as of 12/01/2022  Medication Sig   Alpha-Lipoic Acid 200 MG TABS Take 200 mg by  mouth 3 (three) times daily.   amLODipine (NORVASC) 5 MG tablet TAKE ONE TABLET BY MOUTH EVERY DAY   aspirin 81 MG tablet Take 1 tablet (81 mg total) by mouth daily.   atorvastatin (LIPITOR) 20 MG tablet Take 1 tablet (20 mg total) by mouth daily.   Cholecalciferol 25 MCG (1000 UT) tablet Take 1,000 Units by mouth daily.   Docusate Calcium (STOOL SOFTENER PO) Take by mouth.   levothyroxine (SYNTHROID) 75 MCG tablet TAKE 1 TABLET BY MOUTH ONCE DAILY IN THE MORNING BEFORE BREAKFAST   Multiple Vitamin (MULTIVITAMIN) tablet Take 1 tablet by mouth daily.   Omega-3 Fatty Acids (FISH OIL) 1000 MG CAPS Take 1,000 mg by mouth daily.   pantoprazole (PROTONIX) 40 MG tablet Take 1 tablet (40 mg total) by mouth daily. Take 30 minutes before breakfast   Polyethylene Glycol 3350 (MIRALAX PO) Take by mouth as needed.   Probiotic Product (PROBIOTIC DAILY PO) Take by mouth daily.   telmisartan (MICARDIS) 20 MG tablet Take 1 tablet by mouth daily.   trimethoprim (TRIMPEX) 100 MG tablet Take 1 tablet (100 mg total) by mouth daily.   Wheat Dextrin (BENEFIBER PO) Take by mouth.   Glucosamine-Chondroit-Vit C-Mn (GLUCOSAMINE CHONDR 500 COMPLEX PO) Take 2 tablets by mouth daily. (Patient not taking: Reported on 12/01/2022)   patiromer (VELTASSA) 8.4 g packet Take 1 packet (8.4 g total) by mouth daily. (Patient not taking: Reported on 12/01/2022)  No facility-administered encounter medications on file as of 12/01/2022.    Allergies (verified) Quinapril and Codeine   History: Past Medical History:  Diagnosis Date   Cataracts, bilateral    Chronic kidney disease    Followed by Dr. Cherylann Ratel   Chronic kidney disease    Colon polyp    Constipation due to slow transit    Diarrhea    Diarrhea    GERD (gastroesophageal reflux disease)    Heart murmur    History of cyst of breast    History of hiatal hernia    Hyperlipidemia    Hypertension    Hypothyroidism    Melanoma of lower leg (HCC) 2013   Shingles 2013    Shingles    Spinal stenosis in cervical region    Dr. Yves Dill   Subdural hematoma [facility])    Thyroid disease    Past Surgical History:  Procedure Laterality Date   ABDOMINAL HYSTERECTOMY  1974   ANTERIOR AND POSTERIOR VAGINAL REPAIR     ANTERIOR AND POSTERIOR VAGINAL REPAIR W/ SACROSPINOUS LIGAMENT SUSPENSION Right    BREAST BIOPSY  1963   Benign per pt's report   BREAST EXCISIONAL BIOPSY Right 1963   neg   COLONOSCOPY     COLONOSCOPY N/A 10/29/2021   Procedure: COLONOSCOPY;  Surgeon: Toledo, Boykin Nearing, MD;  Location: [facility] ENDOSCOPY;  Service: Gastroenterology;  Laterality: N/A;   ELECTROMAGNETIC NAVIGATION BROCHOSCOPY Right 01/09/2019   Procedure: ELECTROMAGNETIC NAVIGATION BRONCHOSCOPY;  Surgeon: Salena Saner, MD;  Location: [facility] ORS;  Service: Cardiopulmonary;  Laterality: Right;   ESOPHAGOGASTRODUODENOSCOPY     ESOPHAGOGASTRODUODENOSCOPY (EGD) WITH PROPOFOL N/A 11/10/2016   Procedure: ESOPHAGOGASTRODUODENOSCOPY (EGD) WITH PROPOFOL;  Surgeon: Christena Deem, MD;  Location: [facility] ENDOSCOPY;  Service: Endoscopy;  Laterality: N/A;   ESOPHAGOGASTRODUODENOSCOPY (EGD) WITH PROPOFOL N/A 09/09/2018   Procedure: ESOPHAGOGASTRODUODENOSCOPY (EGD) WITH PROPOFOL;  Surgeon: Christena Deem, MD;  Location[facility] END[facility]COPY;  Service: Endoscopy;  Laterality: N/A;   ESOPHAGOGASTRODUODENOSCOPY (EGD) WITH PROPOFOL N/A 10/27/2019   Procedure: ESOPHAGOGASTRODUODENOSCOPY (EGD) WITH PROPOFOL;  Surgeon: Earline Mayotte, MD;  Location: [facility] ENDOSCOPY;  Service: Endoscopy;  Laterality: N/A;   ESOPHAGOGASTRODUODENOSCOPY (EGD) WITH PROPOFOL N/A 08/09/2020   Procedure: ESOPHAGOGASTRODUODENOSCOPY (EGD) WITH PROPOFOL;  Surgeon: Regis Bill, MD;  Location: [facility] ENDOSCOPY;  Service: Endoscopy;  Laterality: N/A;   LUNG BIOPSY  2007   Benign per pt's report   LUNG BIOPSY Right    OOPHORECTOMY  1970   PERINEOPLASTY  12/17/2013   sling for stress incontinence  12/18/2011   Family History   Problem Relation Age of Onset   Arthritis Mother    Liver disease Mother    Cirrhosis Mother    Liver cancer Mother    Heart disease Father    Heart attack Father    Coronary artery disease Father    Depression Brother    Prostate cancer Brother    Diabetes Maternal Aunt    Breast cancer Cousin        maternal cousins   Liver cancer Cousin    Breast cancer Other    Social History   Socioeconomic History   Marital status: Married    Spouse name: Not on file   Number of children: 2   Years of education: Not on file   Highest education level: Not on file  Occupational History   Occupation: Retired  Tobacco Use   Smoking status: Never   Smokeless tobacco: Never  Vaping Use   Vaping status: Never Used  Substance and Sexual Activity   Alcohol use: No   Drug use: No   Sexual activity: Yes  Other Topics Concern   Not on file  Social History Narrative   Lives in [place] with husband. Has 2 children boy and girl, 4 grandchildren. Retired Insurance underwriter.       Daily Caffeine Use:  NO   Regular Exercise -  Not at this time due to back pain, would like to resume soon   Social Determinants of Health   Financial Resource Strain: Low Risk  (12/01/2022)   Overall Financial Resource Strain (CARDIA)    Difficulty of Paying Living Expenses: Not hard at all  Food Insecurity: No Food Insecurity (12/01/2022)   Hunger Vital Sign    Worried About Running Out of Food in the Last Year: Never true    Ran Out of Food in the Last Year: Never true  Transportation Needs: No Transportation Needs (12/01/2022)   PRAPARE - Administrator, Civil Service (Medical): No    Lack of Transportation (Non-Medical): No  Physical Activity: Sufficiently Active (12/01/2022)   Exercise Vital Sign    Days of Exercise per Week: 5 days    Minutes of Exercise per Session: 40 min  Stress: No Stress Concern Present (12/01/2022)   [employer] of Occupational Health - Occupational Stress Questionnaire     Feeling of Stress : Only a little  Social Connections: Socially Integrated (12/01/2022)   Social Connection and Isolation Panel [NHANES]    Frequency of Communication with Friends and Family: More than three times a week    Frequency of Social Gatherings with Friends and Family: Three times a week    Attends Religious Services: More than 4 times per year    Active Member of Clubs or Organizations: Yes    Attends Engineer, structural: More than 4 times per year    Marital Status: Married    Tobacco Counseling Counseling given: Not Answered   Clinical Intake:  Pre-visit preparation completed: Yes  Pain : No/denies pain     BMI - recorded: 25.75 Nutritional Status: BMI 25 -29 Overweight Nutritional Risks: None Diabetes: No  How often do you need to have someone help you when you read instructions, pamphlets, or other written materials from your doctor or pharmacy?: 1 - Never  Interpreter Needed?: No  Information entered by :: R. Jaimin Krupka LPN   Activities of Daily Living    12/01/2022   11:20 AM 06/20/2022    9:11 AM  In your present state of health, do you have any difficulty performing the following activities:  Hearing? 0 0  Vision? 0 0  Comment glasses   Difficulty concentrating or making decisions? 0 0  Walking or climbing stairs? 0 0  Dressing or bathing? 0 0  Doing errands, shopping? 0 0  Preparing Food and eating ? N   Using the Toilet? N   In the past six months, have you accidently leaked urine? Y   Comment wears pads   Do you have problems with loss of bowel control? Y   Comment occassional signs on pads   Managing your Medications? N   Managing your Finances? N   Housekeeping or managing your Housekeeping? N     Patient Care Team: Dale Easton, MD as PCP - General (Internal Medicine) Debbe Odea, MD as PCP - Cardiology (Cardiology) Mady Haagensen, MD (Internal Medicine) Louellen Molder, NP as Nurse Practitioner  (Gastroenterology)  Indicate any recent Medical Services  you may have received from other than [facility] providers in the past year (date may be approximate).     Assessment:   This is a routine wellness examination for [place].  Hearing/Vision screen Hearing Screening - Comments:: No issues Vision Screening - Comments:: glasses   Goals Addressed             This Visit's Progress    Patient Stated       Continue to exercise to improve her health       Depression Screen    12/01/2022   11:27 AM 03/26/2022    4:21 PM 02/09/2022    1:06 PM 11/26/2021    9:53 AM 11/05/2021    9:31 AM 09/05/2021    9:05 AM 01/03/2021   10:00 AM  PHQ 2/9 Scores  PHQ - 2 Score 0 0 0 0 0 1 0  PHQ- 9 Score 4          Fall Risk    12/01/2022   11:23 AM 03/26/2022    4:21 PM 02/09/2022    1:06 PM 11/26/2021    9:54 AM 11/05/2021    9:31 AM  Fall Risk   Falls in the past year? 0 0 0 0 0  Number falls in past yr: 0 0 0 0 0  Injury with Fall? 0 0 0 0 0  Risk for fall due to : No Fall Risks No Fall Risks No Fall Risks  No Fall Risks  Follow up Falls prevention discussed;Falls evaluation completed Falls evaluation completed Falls evaluation completed Falls evaluation completed Falls evaluation completed    MEDICARE RISK AT HOME: Medicare Risk at Home Any stairs in or around the home?: No If so, are there any without handrails?: No Home free of loose throw rugs in walkways, pet beds, electrical cords, etc?: Yes Adequate lighting in your home to reduce risk of falls?: Yes Life alert?: No Use of a cane, walker or w/c?: No Grab bars in the bathroom?: Yes Shower chair or bench in shower?: No Elevated toilet seat or a handicapped toilet?: Yes  Cognitive Function:    09/24/2014    9:38 AM 09/24/2014    9:36 AM  MMSE - Mini Mental State Exam  Orientation to time 5 5  Orientation to Place 5   Registration 3 3  Attention/ Calculation 5   Recall 3 3  Language- name 2 objects 2   Language- repeat 1  1  Language- follow 3 step command 3   Language- read & follow direction 1 1  Write a sentence 1   Copy design 1 1  Total score 30         12/01/2022   11:33 AM 11/26/2021    9:56 AM 11/25/2020    8:24 AM 11/23/2019    8:41 AM 11/22/2018    8:52 AM  6CIT Screen  What Year? 0 points 0 points 0 points 0 points 0 points  What month? 0 points 0 points 0 points 0 points 0 points  What time? 0 points 0 points 0 points 0 points 0 points  Count back from 20 0 points 0 points 0 points  0 points  Months in reverse 0 points 0 points 0 points  0 points  Repeat phrase 0 points 0 points 0 points  0 points  Total Score 0 points 0 points 0 points  0 points    Immunizations Immunization History  Administered Date(s) Administered   Fluad Quad(high Dose 65+) 12/22/2019, 01/03/2021  Hepatitis A 07/14/2017, 08/17/2017   Hepatitis B 07/14/2017, 08/17/2017   Influenza Split 12/15/2010, 03/07/2012   Influenza, High Dose Seasonal PF 12/21/2016, 11/17/2017, 12/26/2018, 12/31/2021   Influenza,inj,Quad PF,6+ Mos 12/05/2013   Influenza-Unspecified 12/25/2014, 01/03/2016, 12/28/2016   PFIZER(Purple Top)SARS-COV-2 Vaccination 05/29/2019, 06/19/2019   Pneumococcal Conjugate-13 03/12/2014   Pneumococcal Polysaccharide-23 12/09/2005, 03/03/2013   Tdap 03/07/2012   Zoster, Live 12/09/2009    TDAP status: Due, Education has been provided regarding the importance of this vaccine. Advised may receive this vaccine at local pharmacy or Health Dept. Aware to provide a copy of the vaccination record if obtained from local pharmacy or Health Dept. Verbalized acceptance and understanding.  Flu Vaccine status: Due, Education has been provided regarding the importance of this vaccine. Advised may receive this vaccine at local pharmacy or Health Dept. Aware to provide a copy of the vaccination record if obtained from local pharmacy or Health Dept. Verbalized acceptance and understanding.  Pneumococcal vaccine status: Up to  date  Covid-19 vaccine status: Information provided on how to obtain vaccines.   Qualifies for Shingles Vaccine? Yes   Zostavax completed Yes   Shingrix Completed?: No.    Education has been provided regarding the importance of this vaccine. Patient has been advised to call insurance company to determine out of pocket expense if they have not yet received this vaccine. Advised may also receive vaccine at local pharmacy or Health Dept. Verbalized acceptance and understanding.  Screening Tests Health Maintenance  Topic Date Due   DTaP/Tdap/Td (2 - Td or Tdap) 03/07/2022   COVID-19 Vaccine (3 - 2023-24 season) 11/15/2022   Medicare Annual Wellness (AWV)  11/27/2022   Zoster Vaccines- Shingrix (1 of 2) 01/30/2023 (Originally 04/21/1990)   INFLUENZA VACCINE  06/14/2023 (Originally 10/15/2022)   MAMMOGRAM  11/10/2023   Pneumonia Vaccine 85+ Years old  Completed   DEXA SCAN  Completed   HPV VACCINES  Aged Out   Hepatitis C Screening  Discontinued    Health Maintenance  Health Maintenance Due  Topic Date Due   DTaP/Tdap/Td (2 - Td or Tdap) 03/07/2022   COVID-19 Vaccine (3 - 2023-24 season) 11/15/2022   Medicare Annual Wellness (AWV)  11/27/2022    Colorectal cancer screening: No longer required.   Mammogram status: Completed 8/24. Repeat every year  Bone Density status: Completed 2/18. Results reflect: Bone density results: OSTEOPENIA. Repeat every 2 years. Wants to discuss with PCP tomorrow at appointment  Lung Cancer Screening: (Low Dose CT Chest recommended if Age 59-80 years, 20 pack-year currently smoking OR have quit w/in 15years.) does not qualify.     Additional Screening:  Hepatitis C Screening: does not qualify; Completed 1/22  Vision Screening: Recommended annual ophthalmology exams for early detection of glaucoma and other disorders of the eye. Is the patient up to date with their annual eye exam?  Yes  Who is the provider or what is the name of the office in which the  patient attends annual eye exams? [facility] If pt is not established with a provider, would they like to be referred to a provider to establish care? No .   Dental Screening: Recommended annual dental exams for proper oral hygiene    Community Resource Referral / Chronic Care Management: CRR required this visit?  No   CCM required this visit?  No     Plan:     I have personally reviewed and noted the following in the patient's chart:   Medical and social history Use of alcohol, tobacco  or illicit drugs  Current medications and supplements including opioid prescriptions. Patient is not currently taking opioid prescriptions. Functional ability and status Nutritional status Physical activity Advanced directives List of other physicians Hospitalizations, surgeries, and ER visits in previous 12 months Vitals Screenings to include cognitive, depression, and falls Referrals and appointments  In addition, I have reviewed and discussed with patient certain preventive protocols, quality metrics, and best practice recommendations. A written personalized care plan for preventive services as well as general preventive health recommendations were provided to patient.     Sydell Axon, LPN   [id number]/[id number]   After Visit Summary: (MyChart) Due to this being a telephonic visit, the after visit summary with patients personalized plan was offered to patient via MyChart   Nurse Notes: None

## 2022-12-01 NOTE — Patient Instructions (Signed)
Caitlin Lin , Thank you for taking time to come for your Medicare Wellness Visit. I appreciate your ongoing commitment to your health goals. Please review the following plan we discussed and let me know if I can assist you in the future.   Referrals/Orders/Follow-Ups/Clinician Recommendations: None  This is a list of the screening recommended for you and due dates:  Health Maintenance  Topic Date Due   DTaP/Tdap/Td vaccine (2 - Td or Tdap) 03/07/2022   COVID-19 Vaccine (3 - 2023-24 season) 11/15/2022   Zoster (Shingles) Vaccine (1 of 2) 01/30/2023*   Flu Shot  06/14/2023*   Mammogram  11/10/2023   Medicare Annual Wellness Visit  12/01/2023   Pneumonia Vaccine  Completed   DEXA scan (bone density measurement)  Completed   HPV Vaccine  Aged Out   Hepatitis C Screening  Discontinued  *Topic was postponed. The date shown is not the original due date.    Advanced directives: (Copy Requested) Please bring a copy of your health care power of attorney and living will to the office to be added to your chart at your convenience.  Next Medicare Annual Wellness Visit scheduled for next year: Yes 12/06/23 @ 11:00

## 2022-12-02 ENCOUNTER — Encounter: Payer: Self-pay | Admitting: Internal Medicine

## 2022-12-02 ENCOUNTER — Ambulatory Visit: Payer: HMO | Admitting: Internal Medicine

## 2022-12-02 VITALS — BP 148/74 | HR 67 | Temp 97.9°F | Ht 64.0 in | Wt 147.4 lb

## 2022-12-02 DIAGNOSIS — I10 Essential (primary) hypertension: Secondary | ICD-10-CM | POA: Diagnosis not present

## 2022-12-02 DIAGNOSIS — K760 Fatty (change of) liver, not elsewhere classified: Secondary | ICD-10-CM | POA: Diagnosis not present

## 2022-12-02 DIAGNOSIS — I4891 Unspecified atrial fibrillation: Secondary | ICD-10-CM

## 2022-12-02 DIAGNOSIS — D696 Thrombocytopenia, unspecified: Secondary | ICD-10-CM

## 2022-12-02 DIAGNOSIS — R739 Hyperglycemia, unspecified: Secondary | ICD-10-CM

## 2022-12-02 DIAGNOSIS — E039 Hypothyroidism, unspecified: Secondary | ICD-10-CM | POA: Diagnosis not present

## 2022-12-02 DIAGNOSIS — D509 Iron deficiency anemia, unspecified: Secondary | ICD-10-CM

## 2022-12-02 DIAGNOSIS — N184 Chronic kidney disease, stage 4 (severe): Secondary | ICD-10-CM

## 2022-12-02 DIAGNOSIS — I351 Nonrheumatic aortic (valve) insufficiency: Secondary | ICD-10-CM

## 2022-12-02 DIAGNOSIS — K219 Gastro-esophageal reflux disease without esophagitis: Secondary | ICD-10-CM | POA: Diagnosis not present

## 2022-12-02 DIAGNOSIS — R918 Other nonspecific abnormal finding of lung field: Secondary | ICD-10-CM

## 2022-12-02 DIAGNOSIS — R1010 Upper abdominal pain, unspecified: Secondary | ICD-10-CM

## 2022-12-02 DIAGNOSIS — I700 Atherosclerosis of aorta: Secondary | ICD-10-CM | POA: Diagnosis not present

## 2022-12-02 DIAGNOSIS — E7800 Pure hypercholesterolemia, unspecified: Secondary | ICD-10-CM

## 2022-12-02 MED ORDER — AMLODIPINE BESYLATE 5 MG PO TABS: 5.0000 mg | ORAL_TABLET | Freq: Every day | ORAL | 1 refills | Status: DC

## 2022-12-02 NOTE — Patient Instructions (Addendum)
Stop telmisartan  Start amlodipine 5mg  - one per day.   Go to the hospital next week for lab draw as we discussed.

## 2022-12-02 NOTE — Progress Notes (Unsigned)
Subjective:    Patient ID: Caitlin Lin, female    DOB: [birth year], 82 y.o.   MRN: [id number]  Patient here for  Chief Complaint  Patient presents with   Medication Management    HPI Here for ER follow up.  Presented with dizziness and elevated blood pressure.  CT head - no acute change. Her potassium had been elevated. Ace inhibitor and trimethoprim were held.  She just saw urology. Placed back on trimethoprim.  Discussed elevated potassium and possible etiologies.  Discussed ace inhibitors and trimethoprim.  Also discussed diet.  She is trying to watch her diet.  With elevated blood pressure, she started herself back on telmisartan.  Also restarted trimethoprim yesterday.  Blood pressure is better.  No chest pain.  Breathing stable.  She is having increased abdominal pain - some right side pain, shoulder and back pain.  Notices the pain when she eats.  Has a f/u appt with GI - appt Liborio Nixon 12/16/22.    Past Medical History:  Diagnosis Date   Cataracts, bilateral    Chronic kidney disease    Followed by Dr. Cherylann Ratel   Chronic kidney disease    Colon polyp    Constipation due to slow transit    Diarrhea    Diarrhea    GERD (gastroesophageal reflux disease)    Heart murmur    History of cyst of breast    History of hiatal hernia    Hyperlipidemia    Hypertension    Hypothyroidism    Melanoma of lower leg (HCC) 2013   Shingles 2013   Shingles    Spinal stenosis in cervical region    Dr. Yves Dill   Subdural hematoma [facility])    Thyroid disease    Past Surgical History:  Procedure Laterality Date   ABDOMINAL HYSTERECTOMY  1974   ANTERIOR AND POSTERIOR VAGINAL REPAIR     ANTERIOR AND POSTERIOR VAGINAL REPAIR W/ SACROSPINOUS LIGAMENT SUSPENSION Right    BREAST BIOPSY  1963   Benign per pt's report   BREAST EXCISIONAL BIOPSY Right 1963   neg   COLONOSCOPY     COLONOSCOPY N/A 10/29/2021   Procedure: COLONOSCOPY;  Surgeon: Toledo, Boykin Nearing, MD;  Location: [facility] ENDOSCOPY;   Service: Gastroenterology;  Laterality: N/A;   ELECTROMAGNETIC NAVIGATION BROCHOSCOPY Right 01/09/2019   Procedure: ELECTROMAGNETIC NAVIGATION BRONCHOSCOPY;  Surgeon: Salena Saner, MD;  Location: [facility] ORS;  Service: Cardiopulmonary;  Laterality: Right;   ESOPHAGOGASTRODUODENOSCOPY     ESOPHAGOGASTRODUODENOSCOPY (EGD) WITH PROPOFOL N/A 11/10/2016   Procedure: ESOPHAGOGASTRODUODENOSCOPY (EGD) WITH PROPOFOL;  Surgeon: Christena Deem, MD;  Location: [facility] ENDOSCOPY;  Service: Endoscopy;  Laterality: N/A;   ESOPHAGOGASTRODUODENOSCOPY (EGD) WITH PROPOFOL N/A 09/09/2018   Procedure: ESOPHAGOGASTRODUODENOSCOPY (EGD) WITH PROPOFOL;  Surgeon: Christena Deem, MD;  Location: [facility] ENDOSCOPY;  Service: Endoscopy;  Laterality: N/A;   ESOPHAGOGASTRODUODENOSCOPY (EGD) WITH PROPOFOL N/A 10/27/2019   Procedure: ESOPHAGOGASTRODUODENOSCOPY (EGD) WITH PROPOFOL;  Surgeon: Earline Mayotte, MD;  Location: [facility] ENDOSCOPY;  Service: Endoscopy;  Laterality: N/A;   ESOPHAGOGASTRODUODENOSCOPY (EGD) WITH PROPOFOL N/A 08/09/2020   Procedure: ESOPHAGOGASTRODUODENOSCOPY (EGD) WITH PROPOFOL;  Surgeon: Regis Bill, MD;  Location: [facility] ENDOSCOPY;  Service: Endoscopy;  Laterality: N/A;   LUNG BIOPSY  2007   Benign per pt's report   LUNG BIOPSY Right    OOPHORECTOMY  1970   PERINEOPLASTY  12/17/2013   sling for stress incontinence  12/18/2011   Family History  Problem Relation Age of Onset   Arthritis Mother  Liver disease Mother    Cirrhosis Mother    Liver cancer Mother    Heart disease Father    Heart attack Father    Coronary artery disease Father    Depression Brother    Prostate cancer Brother    Diabetes Maternal Aunt    Breast cancer Cousin        maternal cousins   Liver cancer Cousin    Breast cancer Other    Social History   Socioeconomic History   Marital status: Married    Spouse name: Not on file   Number of children: 2   Years of education: Not on file   Highest education  level: Not on file  Occupational History   Occupation: Retired  Tobacco Use   Smoking status: Never   Smokeless tobacco: Never  Vaping Use   Vaping status: Never Used  Substance and Sexual Activity   Alcohol use: No   Drug use: No   Sexual activity: Yes  Other Topics Concern   Not on file  Social History Narrative   Lives in [place] with husband. Has 2 children boy and girl, 4 grandchildren. Retired Insurance underwriter.       Daily Caffeine Use:  NO   Regular Exercise -  Not at this time due to back pain, would like to resume soon   Social Determinants of Health   Financial Resource Strain: Low Risk  (12/01/2022)   Overall Financial Resource Strain (CARDIA)    Difficulty of Paying Living Expenses: Not hard at all  Food Insecurity: No Food Insecurity (12/01/2022)   Hunger Vital Sign    Worried About Running Out of Food in the Last Year: Never true    Ran Out of Food in the Last Year: Never true  Transportation Needs: No Transportation Needs (12/01/2022)   PRAPARE - Administrator, Civil Service (Medical): No    Lack of Transportation (Non-Medical): No  Physical Activity: Sufficiently Active (12/01/2022)   Exercise Vital Sign    Days of Exercise per Week: 5 days    Minutes of Exercise per Session: 40 min  Stress: No Stress Concern Present (12/01/2022)   [employer] of Occupational Health - Occupational Stress Questionnaire    Feeling of Stress : Only a little  Social Connections: Socially Integrated (12/01/2022)   Social Connection and Isolation Panel [NHANES]    Frequency of Communication with Friends and Family: More than three times a week    Frequency of Social Gatherings with Friends and Family: Three times a week    Attends Religious Services: More than 4 times per year    Active Member of Clubs or Organizations: Yes    Attends Banker Meetings: More than 4 times per year    Marital Status: Married     Review of Systems  Constitutional:   Negative for appetite change and unexpected weight change.  HENT:  Negative for congestion and sinus pressure.   Respiratory:  Negative for cough, chest tightness and shortness of breath.   Cardiovascular:  Negative for chest pain and palpitations.  Gastrointestinal:  Positive for abdominal pain. Negative for diarrhea and vomiting.  Genitourinary:  Negative for difficulty urinating and dysuria.  Musculoskeletal:  Negative for joint swelling and myalgias.  Skin:  Negative for color change and rash.  Neurological:  Negative for dizziness and headaches.  Psychiatric/Behavioral:  Negative for agitation and dysphoric mood.        Objective:     BP Marland Kitchen)  148/74   Pulse 67   Temp 97.9 F (36.6 C) (Oral)   Ht 5\' 4"  (1.626 m)   Wt 147 lb 6.4 oz (66.9 kg)   SpO2 92%   BMI 25.30 kg/m  Wt Readings from Last 3 Encounters:  12/02/22 147 lb 6.4 oz (66.9 kg)  12/01/22 150 lb (68 kg)  11/30/22 146 lb (66.2 kg)    Physical Exam Vitals reviewed.  Constitutional:      General: She is not in acute distress.    Appearance: Normal appearance.  HENT:     Head: Normocephalic and atraumatic.     Right Ear: External ear normal.     Left Ear: External ear normal.  Eyes:     General: No scleral icterus.       Right eye: No discharge.        Left eye: No discharge.     Conjunctiva/sclera: Conjunctivae normal.  Neck:     Thyroid: No thyromegaly.  Cardiovascular:     Rate and Rhythm: Normal rate and regular rhythm.  Pulmonary:     Effort: No respiratory distress.     Breath sounds: Normal breath sounds. No wheezing.  Abdominal:     General: Bowel sounds are normal.     Palpations: Abdomen is soft.     Comments: Tenderness to palpation - right upper quadrant and upper abdomen.   Musculoskeletal:        General: No swelling or tenderness.     Cervical back: Neck supple. No tenderness.  Lymphadenopathy:     Cervical: No cervical adenopathy.  Skin:    Findings: No erythema or rash.   Neurological:     Mental Status: She is alert.  Psychiatric:        Mood and Affect: Mood normal.        Behavior: Behavior normal.      Outpatient Encounter Medications as of 12/02/2022  Medication Sig   Alpha-Lipoic Acid 200 MG TABS Take 200 mg by mouth 3 (three) times daily.   amLODipine (NORVASC) 5 MG tablet Take 1 tablet (5 mg total) by mouth daily.   aspirin 81 MG tablet Take 1 tablet (81 mg total) by mouth daily.   atorvastatin (LIPITOR) 20 MG tablet Take 1 tablet (20 mg total) by mouth daily.   Cholecalciferol 25 MCG (1000 UT) tablet Take 1,000 Units by mouth daily.   Docusate Calcium (STOOL SOFTENER PO) Take by mouth.   levothyroxine (SYNTHROID) 75 MCG tablet TAKE 1 TABLET BY MOUTH ONCE DAILY IN THE MORNING BEFORE BREAKFAST   Multiple Vitamin (MULTIVITAMIN) tablet Take 1 tablet by mouth daily.   Omega-3 Fatty Acids (FISH OIL) 1000 MG CAPS Take 1,000 mg by mouth daily.   pantoprazole (PROTONIX) 40 MG tablet Take 1 tablet (40 mg total) by mouth daily. Take 30 minutes before breakfast   Polyethylene Glycol 3350 (MIRALAX PO) Take by mouth as needed.   Probiotic Product (PROBIOTIC DAILY PO) Take by mouth daily.   trimethoprim (TRIMPEX) 100 MG tablet Take 1 tablet (100 mg total) by mouth daily.   Wheat Dextrin (BENEFIBER PO) Take by mouth.   [DISCONTINUED] amLODipine (NORVASC) 5 MG tablet TAKE ONE TABLET BY MOUTH EVERY DAY   [DISCONTINUED] Glucosamine-Chondroit-Vit C-Mn (GLUCOSAMINE CHONDR 500 COMPLEX PO) Take 2 tablets by mouth daily. (Patient not taking: Reported on 12/01/2022)   [DISCONTINUED] patiromer (VELTASSA) 8.4 g packet Take 1 packet (8.4 g total) by mouth daily. (Patient not taking: Reported on 12/01/2022)   [DISCONTINUED] telmisartan (MICARDIS) 20  MG tablet Take 1 tablet by mouth daily.   No facility-administered encounter medications on file as of 12/02/2022.     Lab Results  Component Value Date   WBC 5.3 11/27/2022   HGB 11.4 (L) 11/27/2022   HCT 34.4 (L)  11/27/2022   PLT 162 11/27/2022   GLUCOSE 99 11/27/2022   CHOL 153 10/28/2022   TRIG 171.0 (H) 10/28/2022   HDL 38.60 (L) 10/28/2022   LDLDIRECT 80.0 01/05/2022   LDLCALC 80 10/28/2022   ALT 17 10/28/2022   AST 24 10/28/2022   NA 134 (L) 11/27/2022   K 4.8 11/27/2022   CL 104 11/27/2022   CREATININE 1.39 (H) 11/27/2022   BUN 27 (H) 11/27/2022   CO2 21 (L) 11/27/2022   TSH 0.88 10/08/2022   INR 1.1 06/18/2022   HGBA1C 5.8 10/28/2022   MICROALBUR 2.1 (H) 03/27/2015    CT HEAD WO CONTRAST ( )  Result Date: 11/27/2022 CLINICAL DATA:  Dizziness. EXAM: CT HEAD WITHOUT CONTRAST TECHNIQUE: Contiguous axial images were obtained from the base of the skull through the vertex without intravenous contrast. RADIATION DOSE REDUCTION: This exam was performed according to the departmental dose-optimization program which includes automated exposure control, adjustment of the mA and/or kV according to patient size and/or use of iterative reconstruction technique. COMPARISON:  Head CT 02/15/2009.  MRI brain 11/21/2013. FINDINGS: Brain: No acute intracranial hemorrhage. Gray-white differentiation is preserved. No hydrocephalus or extra-axial collection. No mass effect or midline shift. Vascular: No hyperdense vessel or unexpected calcification. Skull: No calvarial fracture or suspicious bone lesion. Skull base is unremarkable. Sinuses/Orbits: No acute finding. Other: None. IMPRESSION: No acute intracranial abnormality. Electronically Signed   By: Orvan Falconer M.D.   On: 11/27/2022 15:24       Assessment & Plan:  Pain of upper abdomen Assessment & Plan: Increased abdominal pain as outlined.  Notices after eating.  Discussed further w/up. Had renal stone CT in July.  Given increased symptoms and pain as outlined, will check abdominal utrasound.  Keep f/u appt with GI.    Orders: -     US Abdomen Complete; Future  Iron deficiency anemia, unspecified iron deficiency anemia type Assessment &  Plan: Follow cbc.    Aortic atherosclerosis (HCC) Assessment & Plan: Continue lipitor.    Aortic valve insufficiency, etiology of cardiac valve disease unspecified Assessment & Plan: ECHO - aortic regurgitation.  Saw cardiology.  Coronary artery calcifications on noncontrast chest CT. Previous echocardiogram with normal systolic and diastolic function.  Lexiscan Myoview 03/2020 with no significant ischemia, low risk.  Continue aspirin, statin.  Saw cardiology 10/14/22 - f/u low blood pressure and increased heart rate.  Was feeling better.  Recent ER visit as outlined.  Discussed elevated blood pressure.  Given her issues with increased potassium and renal function - will stop micardis.  Start amlodipine 5mg  q day.  Follow pressures.  Follow metabolic panel.    Atrial fibrillation, unspecified type [facility]) Assessment & Plan: Was found to have new onset afib with RVR (up to 186) - during recent hospitalization. Was given IV cardizem - followed by oral cardizem.  CXR negative.  Dr Kirke Corin consulted. Found to have UTI - E.coli pan sensitive.  IV abx and discharged on cipro for 10 days total abx.  Not started on anticoagulants.  Was felt afib - related to acute illness.  Zio monitor placed.  Discharged on metoprolol. Had f/u with cardiology 08/21/22. No further arrhythmias noted. Previous myoview - no ischemia.  Echo with normal EF.  Recommended continuing aspirin and statin. Hold anticoagulation - lone afib in the setting of UTI sepsis.  Overall doing well.  Denies any increased heart rate or palpitations.    CKD (chronic kidney disease) stage 4, GFR 15-29 ml/min [facility]) Assessment & Plan: Seeing Dr Eulah Pont [facility] nephrology) - 8/15.  No changes made.  Reports no change as long as potassium remaining below 5.5. recent elevation - potassium.  Micardis and trimethoprim stopped.  Recently saw urology.  Trimethoprim added back.  Will stop micardis. Start amlodipine.  Follow metabolic panel.    Fatty  liver Assessment & Plan: Follow liver panel.    Gastroesophageal reflux disease without esophagitis Assessment & Plan: EGD - 08/09/20 - mild reactive gastropathy, fundic gland polyps, mucosa with edema and congestion. Does report some acid reflux at times.  Also reported feeling, when she eats, that food does not want to go down.  See last note. Has appt 12/2022.  Continue protonix as she is doing.  Add pepcid.    Hypercholesterolemia Assessment & Plan: Continue lipitor.  Low cholesterol diet and exercise.  Follow lipid panel and liver function tests.     Hyperglycemia Assessment & Plan: Low carb diet and exercise.  Follow met b and a1c.     Primary hypertension Assessment & Plan: Blood pressure as outlined.  Was elevated recently.  She started herself back on telmisartan.  Will stop telmisartan.  Start amlodipine. Follow pressures.  Follow metabolic panel.    Hypothyroidism, unspecified type Assessment & Plan: On thyroid replacement.  Follow tsh.    Lung nodules Assessment & Plan: Had f/u with Dr Jayme Cloud 03/2022.  F/u Chest CT shows that the previous area of concern completely resolved.  She does not need any more chest CTs.    Thrombocytopenia (HCC) Assessment & Plan: Follow cbc.    Other orders -     amLODIPine Besylate; Take 1 tablet (5 mg total) by mouth daily.  Dispense: 90 tablet; Refill: 1     Dale Oildale, MD

## 2022-12-03 LAB — CULTURE, URINE COMPREHENSIVE

## 2022-12-04 ENCOUNTER — Encounter: Payer: Self-pay | Admitting: Internal Medicine

## 2022-12-04 NOTE — Assessment & Plan Note (Signed)
Increased abdominal pain as outlined.  Notices after eating.  Discussed further w/up. Had renal stone CT in July.  Given increased symptoms and pain as outlined, will check abdominal utrasound.  Keep f/u appt with GI.

## 2022-12-06 ENCOUNTER — Encounter: Payer: Self-pay | Admitting: Internal Medicine

## 2022-12-06 NOTE — Assessment & Plan Note (Signed)
Continue lipitor.  Low cholesterol diet and exercise.  Follow lipid panel and liver function tests.   

## 2022-12-06 NOTE — Assessment & Plan Note (Signed)
Was found to have new onset afib with RVR (up to 186) - during recent hospitalization. Was given IV cardizem - followed by oral cardizem.  CXR negative.  Dr Kirke Corin consulted. Found to have UTI - E.coli pan sensitive.  IV abx and discharged on cipro for 10 days total abx.  Not started on anticoagulants.  Was felt afib - related to acute illness.  Zio monitor placed.  Discharged on metoprolol. Had f/u with cardiology 08/21/22. No further arrhythmias noted. Previous myoview - no ischemia.  Echo with normal EF.  Recommended continuing aspirin and statin. Hold anticoagulation - lone afib in the setting of UTI sepsis.  Overall doing well.  Denies any increased heart rate or palpitations.

## 2022-12-06 NOTE — Assessment & Plan Note (Signed)
Follow cbc.

## 2022-12-06 NOTE — Assessment & Plan Note (Signed)
Seeing Dr Eulah Pont [facility] nephrology) - 8/15.  No changes made.  Reports no change as long as potassium remaining below 5.5. recent elevation - potassium.  Micardis and trimethoprim stopped.  Recently saw urology.  Trimethoprim added back.  Will stop micardis. Start amlodipine.  Follow metabolic panel.

## 2022-12-06 NOTE — Assessment & Plan Note (Signed)
Had f/u with Dr Jayme Cloud 03/2022.  F/u Chest CT shows that the previous area of concern completely resolved.  She does not need any more chest CTs.

## 2022-12-06 NOTE — Assessment & Plan Note (Signed)
Follow cbc.  

## 2022-12-06 NOTE — Assessment & Plan Note (Signed)
EGD - 08/09/20 - mild reactive gastropathy, fundic gland polyps, mucosa with edema and congestion. Does report some acid reflux at times.  Also reported feeling, when she eats, that food does not want to go down.  See last note. Has appt 12/2022.  Continue protonix as she is doing.  Add pepcid.

## 2022-12-06 NOTE — Assessment & Plan Note (Signed)
Continue lipitor  ?

## 2022-12-06 NOTE — Assessment & Plan Note (Signed)
ECHO - aortic regurgitation.  Saw cardiology.  Coronary artery calcifications on noncontrast chest CT. Previous echocardiogram with normal systolic and diastolic function.  Lexiscan Myoview 03/2020 with no significant ischemia, low risk.  Continue aspirin, statin.  Saw cardiology 10/14/22 - f/u low blood pressure and increased heart rate.  Was feeling better.  Recent ER visit as outlined.  Discussed elevated blood pressure.  Given her issues with increased potassium and renal function - will stop micardis.  Start amlodipine 5mg  q day.  Follow pressures.  Follow metabolic panel.

## 2022-12-06 NOTE — Assessment & Plan Note (Signed)
Follow liver panel.

## 2022-12-06 NOTE — Assessment & Plan Note (Signed)
On thyroid replacement.  Follow tsh.

## 2022-12-06 NOTE — Assessment & Plan Note (Signed)
Low carb diet and exercise.  Follow met b and a1c.  

## 2022-12-06 NOTE — Assessment & Plan Note (Signed)
Blood pressure as outlined.  Was elevated recently.  She started herself back on telmisartan.  Will stop telmisartan.  Start amlodipine. Follow pressures.  Follow metabolic panel.

## 2022-12-07 ENCOUNTER — Other Ambulatory Visit (INDEPENDENT_AMBULATORY_CARE_PROVIDER_SITE_OTHER): Payer: HMO

## 2022-12-07 ENCOUNTER — Telehealth: Payer: Self-pay | Admitting: Internal Medicine

## 2022-12-07 ENCOUNTER — Ambulatory Visit
Admission: RE | Admit: 2022-12-07 | Discharge: 2022-12-07 | Disposition: A | Discharge status: Home / Self Care | Payer: HMO | Source: Ambulatory Visit | Attending: Internal Medicine | Admitting: Internal Medicine

## 2022-12-07 DIAGNOSIS — R1010 Upper abdominal pain, unspecified: Secondary | ICD-10-CM | POA: Diagnosis not present

## 2022-12-07 DIAGNOSIS — E875 Hyperkalemia: Secondary | ICD-10-CM | POA: Diagnosis not present

## 2022-12-07 DIAGNOSIS — R109 Unspecified abdominal pain: Secondary | ICD-10-CM | POA: Diagnosis not present

## 2022-12-07 LAB — POTASSIUM: Potassium: 5.4 mEq/L — ABNORMAL HIGH (ref 3.5–5.1)

## 2022-12-07 NOTE — Telephone Encounter (Signed)
Order placed for stat potassium.

## 2022-12-08 ENCOUNTER — Other Ambulatory Visit: Payer: Self-pay

## 2022-12-08 DIAGNOSIS — E875 Hyperkalemia: Secondary | ICD-10-CM

## 2022-12-10 ENCOUNTER — Other Ambulatory Visit: Payer: Self-pay | Admitting: Internal Medicine

## 2022-12-11 ENCOUNTER — Other Ambulatory Visit
Admission: RE | Admit: 2022-12-11 | Discharge: 2022-12-11 | Disposition: A | Discharge status: Home / Self Care | Payer: HMO | Attending: Internal Medicine | Admitting: Internal Medicine

## 2022-12-11 DIAGNOSIS — E875 Hyperkalemia: Secondary | ICD-10-CM | POA: Diagnosis not present

## 2022-12-11 LAB — POTASSIUM: Potassium: 4.9 mmol/L (ref 3.5–5.1)

## 2022-12-16 DIAGNOSIS — K449 Diaphragmatic hernia without obstruction or gangrene: Secondary | ICD-10-CM | POA: Diagnosis not present

## 2022-12-16 DIAGNOSIS — R1310 Dysphagia, unspecified: Secondary | ICD-10-CM | POA: Diagnosis not present

## 2022-12-16 DIAGNOSIS — K219 Gastro-esophageal reflux disease without esophagitis: Secondary | ICD-10-CM | POA: Diagnosis not present

## 2022-12-30 ENCOUNTER — Encounter: Payer: Self-pay | Admitting: Internal Medicine

## 2022-12-30 ENCOUNTER — Other Ambulatory Visit
Admission: RE | Admit: 2022-12-30 | Discharge: 2022-12-30 | Disposition: A | Discharge status: Home / Self Care | Payer: HMO | Source: Ambulatory Visit | Attending: Internal Medicine | Admitting: Internal Medicine

## 2022-12-30 ENCOUNTER — Ambulatory Visit (INDEPENDENT_AMBULATORY_CARE_PROVIDER_SITE_OTHER): Payer: HMO | Admitting: Internal Medicine

## 2022-12-30 VITALS — BP 124/74 | HR 86 | Temp 98.6°F | Ht 64.0 in | Wt 145.0 lb

## 2022-12-30 DIAGNOSIS — E7800 Pure hypercholesterolemia, unspecified: Secondary | ICD-10-CM

## 2022-12-30 DIAGNOSIS — E039 Hypothyroidism, unspecified: Secondary | ICD-10-CM | POA: Diagnosis not present

## 2022-12-30 DIAGNOSIS — I700 Atherosclerosis of aorta: Secondary | ICD-10-CM | POA: Diagnosis not present

## 2022-12-30 DIAGNOSIS — R319 Hematuria, unspecified: Secondary | ICD-10-CM

## 2022-12-30 DIAGNOSIS — D696 Thrombocytopenia, unspecified: Secondary | ICD-10-CM | POA: Diagnosis not present

## 2022-12-30 DIAGNOSIS — I129 Hypertensive chronic kidney disease with stage 1 through stage 4 chronic kidney disease, or unspecified chronic kidney disease: Secondary | ICD-10-CM | POA: Insufficient documentation

## 2022-12-30 DIAGNOSIS — R739 Hyperglycemia, unspecified: Secondary | ICD-10-CM

## 2022-12-30 DIAGNOSIS — E875 Hyperkalemia: Secondary | ICD-10-CM

## 2022-12-30 DIAGNOSIS — D509 Iron deficiency anemia, unspecified: Secondary | ICD-10-CM

## 2022-12-30 DIAGNOSIS — I4891 Unspecified atrial fibrillation: Secondary | ICD-10-CM | POA: Diagnosis not present

## 2022-12-30 DIAGNOSIS — F439 Reaction to severe stress, unspecified: Secondary | ICD-10-CM

## 2022-12-30 DIAGNOSIS — K219 Gastro-esophageal reflux disease without esophagitis: Secondary | ICD-10-CM

## 2022-12-30 DIAGNOSIS — I10 Essential (primary) hypertension: Secondary | ICD-10-CM | POA: Diagnosis not present

## 2022-12-30 DIAGNOSIS — N184 Chronic kidney disease, stage 4 (severe): Secondary | ICD-10-CM

## 2022-12-30 LAB — BASIC METABOLIC PANEL
Anion gap: 8 (ref 5–15)
BUN: 26 mg/dL — ABNORMAL HIGH (ref 8–23)
CO2: 24 mmol/L (ref 22–32)
Calcium: 9.3 mg/dL (ref 8.9–10.3)
Chloride: 106 mmol/L (ref 98–111)
Creatinine, Ser: 1.96 mg/dL — ABNORMAL HIGH (ref 0.44–1.00)
GFR, Estimated: 25 mL/min — ABNORMAL LOW (ref >60)
Glucose, Bld: 95 mg/dL (ref 70–99)
Potassium: 4.6 mmol/L (ref 3.5–5.1)
Sodium: 138 mmol/L (ref 135–145)

## 2022-12-30 NOTE — Progress Notes (Signed)
Subjective:    Patient ID: Caitlin Lin, female    DOB: 1941-01-07, 82 y.o.   MRN: [id number]  Patient here for  Chief Complaint  Patient presents with   Medical Management of Chronic Issues    HPI Here for a scheduled follow up. Here to follow up regarding hypertension, hyperkalemia and previous abdominal pain. Given her issues with elevated blood pressure and elevated potassium, micardis was stopped and she was started on amlodipine.  Was having increased abdominal discomfort.  Abdominal ultrasound 11/2022 - no cholelithiasis or evidence of acute cholecystitis. Saw GI 12/16/22 - recommended increasing protonix to 40mg  bid.  Recommended EGD. EGD scheduled for 03/02/23.  She is feeling better overall. Blood pressure is doing better.  Need to recheck potassium today.    Past Medical History:  Diagnosis Date   Cataracts, bilateral    Chronic kidney disease    Followed by Dr. Cherylann Ratel   Chronic kidney disease    Colon polyp    Constipation due to slow transit    Diarrhea    Diarrhea    GERD (gastroesophageal reflux disease)    Heart murmur    History of cyst of breast    History of hiatal hernia    Hyperlipidemia    Hypertension    Hypothyroidism    Melanoma of lower leg (HCC) 2013   Shingles 2013   Shingles    Spinal stenosis in cervical region    Dr. Yves Dill   Subdural hematoma [facility])    Thyroid disease    Past Surgical History:  Procedure Laterality Date   ABDOMINAL HYSTERECTOMY  1974   ANTERIOR AND POSTERIOR VAGINAL REPAIR     ANTERIOR AND POSTERIOR VAGINAL REPAIR W/ SACROSPINOUS LIGAMENT SUSPENSION Right    BREAST BIOPSY  1963   Benign per pt's report   BREAST EXCISIONAL BIOPSY Right 1963   neg   COLONOSCOPY     COLONOSCOPY N/A 10/29/2021   Procedure: COLONOSCOPY;  Surgeon: Toledo, Boykin Nearing, MD;  Location: [facility] ENDOSCOPY;  Service: Gastroenterology;  Laterality: N/A;   ELECTROMAGNETIC NAVIGATION BROCHOSCOPY Right 01/09/2019   Procedure: ELECTROMAGNETIC  NAVIGATION BRONCHOSCOPY;  Surgeon: Salena Saner, MD;  Location: [facility] ORS;  Service: Cardiopulmonary;  Laterality: Right;   ESOPHAGOGASTRODUODENOSCOPY     ESOPHAGOGASTRODUODENOSCOPY (EGD) WITH PROPOFOL N/A 11/10/2016   Procedure: ESOPHAGOGASTRODUODENOSCOPY (EGD) WITH PROPOFOL;  Surgeon: Christena Deem, MD;  Location: [facility] ENDOSCOPY;  Service: Endoscopy;  Laterality: N/A;   ESOPHAGOGASTRODUODENOSCOPY (EGD) WITH PROPOFOL N/A 09/09/2018   Procedure: ESOPHAGOGASTRODUODENOSCOPY (EGD) WITH PROPOFOL;  Surgeon: Christena Deem, MD;  Location: [facility] ENDOSCOPY;  Service: Endoscopy;  Laterality: N/A;   ESOPHAGOGASTRODUODENOSCOPY (EGD) WITH PROPOFOL N/A 10/27/2019   Procedure: ESOPHAGOGASTRODUODENOSCOPY (EGD) WITH PROPOFOL;  Surgeon: Earline Mayotte, MD;  Location: [facility] ENDOSCOPY;  Service: Endoscopy;  Laterality: N/A;   ESOPHAGOGASTRODUODENOSCOPY (EGD) WITH PROPOFOL N/A 08/09/2020   Procedure: ESOPHAGOGASTRODUODENOSCOPY (EGD) WITH PROPOFOL;  Surgeon: Regis Bill, MD;  Location: [facility] ENDOSCOPY;  Service: Endoscopy;  Laterality: N/A;   LUNG BIOPSY  2007   Benign per pt's report   LUNG BIOPSY Right    OOPHORECTOMY  1970   PERINEOPLASTY  12/17/2013   sling for stress incontinence  12/18/2011   Family History  Problem Relation Age of Onset   Arthritis Mother    Liver disease Mother    Cirrhosis Mother    Liver cancer Mother    Heart disease Father    Heart attack Father    Coronary artery disease Father  Depression Brother    Prostate cancer Brother    Diabetes Maternal Aunt    Breast cancer Cousin        maternal cousins   Liver cancer Cousin    Breast cancer Other    Social History   Socioeconomic History   Marital status: Married    Spouse name: Not on file   Number of children: 2   Years of education: Not on file   Highest education level: Not on file  Occupational History   Occupation: Retired  Tobacco Use   Smoking status: Never   Smokeless tobacco: Never   Vaping Use   Vaping status: Never Used  Substance and Sexual Activity   Alcohol use: No   Drug use: No   Sexual activity: Yes  Other Topics Concern   Not on file  Social History Narrative   Lives in [place] with husband. Has 2 children boy and girl, 4 grandchildren. Retired Insurance underwriter.       Daily Caffeine Use:  NO   Regular Exercise -  Not at this time due to back pain, would like to resume soon   Social Determinants of Health   Financial Resource Strain: Low Risk  (12/01/2022)   Overall Financial Resource Strain (CARDIA)    Difficulty of Paying Living Expenses: Not hard at all  Food Insecurity: No Food Insecurity (12/01/2022)   Hunger Vital Sign    Worried About Running Out of Food in the Last Year: Never true    Ran Out of Food in the Last Year: Never true  Transportation Needs: No Transportation Needs (12/01/2022)   PRAPARE - Administrator, Civil Service (Medical): No    Lack of Transportation (Non-Medical): No  Physical Activity: Sufficiently Active (12/01/2022)   Exercise Vital Sign    Days of Exercise per Week: 5 days    Minutes of Exercise per Session: 40 min  Stress: No Stress Concern Present (12/01/2022)   [employer] of Occupational Health - Occupational Stress Questionnaire    Feeling of Stress : Only a little  Social Connections: Socially Integrated (12/01/2022)   Social Connection and Isolation Panel [NHANES]    Frequency of Communication with Friends and Family: More than three times a week    Frequency of Social Gatherings with Friends and Family: Three times a week    Attends Religious Services: More than 4 times per year    Active Member of Clubs or Organizations: Yes    Attends Banker Meetings: More than 4 times per year    Marital Status: Married     Review of Systems  Constitutional:  Negative for appetite change and unexpected weight change.  HENT:  Negative for congestion and sinus pressure.   Respiratory:  Negative  for cough and chest tightness.        Breathing stable.   Cardiovascular:  Negative for chest pain and palpitations.  Gastrointestinal:  Negative for diarrhea and vomiting.       Abdominal pain improved.   Genitourinary:  Negative for difficulty urinating and dysuria.  Musculoskeletal:  Negative for joint swelling and myalgias.  Skin:  Negative for color change and rash.  Neurological:  Negative for dizziness and headaches.  Psychiatric/Behavioral:  Negative for agitation and dysphoric mood.        Objective:     BP 124/74   Pulse 86   Temp 98.6 F (37 C) (Oral)   Ht 5\' 4"  (1.626 m)   Wt 145 lb (  65.8 kg)   SpO2 98%   BMI 24.89 kg/m  Wt Readings from Last 3 Encounters:  12/30/22 145 lb (65.8 kg)  12/02/22 147 lb 6.4 oz (66.9 kg)  12/01/22 150 lb (68 kg)    Physical Exam Vitals reviewed.  Constitutional:      General: She is not in acute distress.    Appearance: Normal appearance.  HENT:     Head: Normocephalic and atraumatic.     Right Ear: External ear normal.     Left Ear: External ear normal.  Eyes:     General: No scleral icterus.       Right eye: No discharge.        Left eye: No discharge.     Conjunctiva/sclera: Conjunctivae normal.  Neck:     Thyroid: No thyromegaly.  Cardiovascular:     Rate and Rhythm: Normal rate and regular rhythm.  Pulmonary:     Effort: No respiratory distress.     Breath sounds: Normal breath sounds. No wheezing.  Abdominal:     General: Bowel sounds are normal.     Palpations: Abdomen is soft.     Tenderness: There is no abdominal tenderness.  Musculoskeletal:        General: No swelling or tenderness.     Cervical back: Neck supple. No tenderness.  Lymphadenopathy:     Cervical: No cervical adenopathy.  Skin:    Findings: No erythema or rash.  Neurological:     Mental Status: She is alert.  Psychiatric:        Mood and Affect: Mood normal.        Behavior: Behavior normal.      Outpatient Encounter Medications as  of 12/30/2022  Medication Sig   Alpha-Lipoic Acid 200 MG TABS Take 200 mg by mouth 3 (three) times daily.   amLODipine (NORVASC) 5 MG tablet Take 1 tablet (5 mg total) by mouth daily.   aspirin 81 MG tablet Take 1 tablet (81 mg total) by mouth daily.   atorvastatin (LIPITOR) 20 MG tablet Take 1 tablet (20 mg total) by mouth daily.   Cholecalciferol 25 MCG (1000 UT) tablet Take 1,000 Units by mouth daily.   Docusate Calcium (STOOL SOFTENER PO) Take by mouth.   levothyroxine (SYNTHROID) 75 MCG tablet TAKE 1 TABLET BY MOUTH ONCE DAILY IN THE MORNING BEFORE BREAKFAST   Multiple Vitamin (MULTIVITAMIN) tablet Take 1 tablet by mouth daily.   Omega-3 Fatty Acids (FISH OIL) 1000 MG CAPS Take 1,000 mg by mouth daily.   pantoprazole (PROTONIX) 40 MG tablet Take 1 tablet (40 mg total) by mouth daily. Take 30 minutes before breakfast   Polyethylene Glycol 3350 (MIRALAX PO) Take by mouth as needed.   Probiotic Product (PROBIOTIC DAILY PO) Take by mouth daily.   trimethoprim (TRIMPEX) 100 MG tablet Take 1 tablet (100 mg total) by mouth daily.   Wheat Dextrin (BENEFIBER PO) Take by mouth.   No facility-administered encounter medications on file as of 12/30/2022.     Lab Results  Component Value Date   WBC 5.3 11/27/2022   HGB 11.4 (L) 11/27/2022   HCT 34.4 (L) 11/27/2022   PLT 162 11/27/2022   GLUCOSE 95 12/30/2022   CHOL 153 10/28/2022   TRIG 171.0 (H) 10/28/2022   HDL 38.60 (L) 10/28/2022   LDLDIRECT 80.0 01/05/2022   LDLCALC 80 10/28/2022   ALT 17 10/28/2022   AST 24 10/28/2022   NA 138 12/30/2022   K 4.6 12/30/2022   CL 106  12/30/2022   CREATININE 1.96 (H) 12/30/2022   BUN 26 (H) 12/30/2022   CO2 24 12/30/2022   TSH 0.88 10/08/2022   INR 1.1 06/18/2022   HGBA1C 5.8 10/28/2022   MICROALBUR 2.1 (H) 03/27/2015    No results found.     Assessment & Plan:  Primary hypertension Assessment & Plan: Blood pressure as outlined.  Was elevated recently.  Now on amlodipine. Blood pressure  better. Follow pressures.  Follow metabolic panel.   Orders: -     Basic metabolic panel  Hyperkalemia Assessment & Plan: Off micardis.  Back on trimethoprim - per urology. Recheck potassium today.   Orders: -     Basic metabolic panel  CKD (chronic kidney disease) stage 4, GFR 15-29 ml/min (HCC) Assessment & Plan: Due f/u tomorrow.  Off micardis.  On amlodipine.  Blood pressure better.  Back on trimethoprim per urology.  Watching diet.  Check metabolic panel.   Orders: -     Basic metabolic panel  Thrombocytopenia (HCC) Assessment & Plan: Follow cbc.    Stress Assessment & Plan: On zoloft and doing well on this medication.  Continue zoloft.  Follow.    Hypothyroidism, unspecified type Assessment & Plan: On thyroid replacement.  Follow tsh.    Hyperglycemia Assessment & Plan: Low carb diet and exercise.  Follow met b and a1c.     Hypercholesterolemia Assessment & Plan: Continue lipitor.  Low cholesterol diet and exercise.  Follow lipid panel and liver function tests.     Hematuria, unspecified type Assessment & Plan: Urology 08/24/22 - CT scan   Gastroesophageal reflux disease without esophagitis Assessment & Plan: EGD - 08/09/20 - mild reactive gastropathy, fundic gland polyps, mucosa with edema and congestion. Persistent abdominal discomfort as outlined previous note.  Saw GI 12/2022. Recommended protonix bid.  Plan EGD.  Abdominal ultrasound as outlined.  Feeling better.  Follow.    Atrial fibrillation, unspecified type [facility]) Assessment & Plan: Was found to have new onset afib with RVR (up to 186) - during recent hospitalization. Was given IV cardizem - followed by oral cardizem.  CXR negative.  Dr Kirke Corin consulted. Found to have UTI - E.coli pan sensitive.  IV abx and discharged on cipro for 10 days total abx.  Not started on anticoagulants.  Was felt afib - related to acute illness.  Zio monitor placed.  Discharged on metoprolol. Had f/u with cardiology  08/21/22. No further arrhythmias noted. Previous myoview - no ischemia.  Echo with normal EF.  Recommended continuing aspirin and statin. Hold anticoagulation - lone afib in the setting of UTI sepsis.  Overall doing well.  Denies any increased heart rate or palpitations.    Aortic atherosclerosis (HCC) Assessment & Plan: Continue lipitor.    Iron deficiency anemia, unspecified iron deficiency anemia type Assessment & Plan: Follow cbc.       [place], MD

## 2022-12-31 DIAGNOSIS — N184 Chronic kidney disease, stage 4 (severe): Secondary | ICD-10-CM | POA: Diagnosis not present

## 2022-12-31 DIAGNOSIS — E875 Hyperkalemia: Secondary | ICD-10-CM | POA: Diagnosis not present

## 2022-12-31 DIAGNOSIS — I10 Essential (primary) hypertension: Secondary | ICD-10-CM | POA: Diagnosis not present

## 2023-01-03 NOTE — Assessment & Plan Note (Signed)
EGD - 08/09/20 - mild reactive gastropathy, fundic gland polyps, mucosa with edema and congestion. Persistent abdominal discomfort as outlined previous note.  Saw GI 12/2022. Recommended protonix bid.  Plan EGD.  Abdominal ultrasound as outlined.  Feeling better.  Follow.

## 2023-01-03 NOTE — Assessment & Plan Note (Signed)
Follow cbc.  

## 2023-01-03 NOTE — Assessment & Plan Note (Signed)
On zoloft and doing well on this medication.  Continue zoloft.  Follow.

## 2023-01-03 NOTE — Assessment & Plan Note (Signed)
Was found to have new onset afib with RVR (up to 186) - during recent hospitalization. Was given IV cardizem - followed by oral cardizem.  CXR negative.  Dr Kirke Corin consulted. Found to have UTI - E.coli pan sensitive.  IV abx and discharged on cipro for 10 days total abx.  Not started on anticoagulants.  Was felt afib - related to acute illness.  Zio monitor placed.  Discharged on metoprolol. Had f/u with cardiology 08/21/22. No further arrhythmias noted. Previous myoview - no ischemia.  Echo with normal EF.  Recommended continuing aspirin and statin. Hold anticoagulation - lone afib in the setting of UTI sepsis.  Overall doing well.  Denies any increased heart rate or palpitations.

## 2023-01-03 NOTE — Assessment & Plan Note (Signed)
Urology 08/24/22 - CT scan

## 2023-01-03 NOTE — Assessment & Plan Note (Signed)
Continue lipitor  ?

## 2023-01-03 NOTE — Assessment & Plan Note (Signed)
Low carb diet and exercise.  Follow met b and a1c.  

## 2023-01-03 NOTE — Assessment & Plan Note (Signed)
Blood pressure as outlined.  Was elevated recently.  Now on amlodipine. Blood pressure better. Follow pressures.  Follow metabolic panel.

## 2023-01-03 NOTE — Assessment & Plan Note (Signed)
Off micardis.  Back on trimethoprim - per urology. Recheck potassium today.

## 2023-01-03 NOTE — Assessment & Plan Note (Signed)
Continue lipitor.  Low cholesterol diet and exercise.  Follow lipid panel and liver function tests.   

## 2023-01-03 NOTE — Assessment & Plan Note (Signed)
Due f/u tomorrow.  Off micardis.  On amlodipine.  Blood pressure better.  Back on trimethoprim per urology.  Watching diet.  Check metabolic panel.

## 2023-01-03 NOTE — Assessment & Plan Note (Signed)
On thyroid replacement.  Follow tsh.  

## 2023-02-01 ENCOUNTER — Telehealth: Payer: Self-pay

## 2023-02-01 NOTE — Telephone Encounter (Signed)
This patient is appearing on a report for being at risk of failing the adherence measure for hypertension (ACEi/ARB) medications this calendar year.   Medication: TELMISARTAN 20 MG TABLET Last fill date: 09/08/22 for 90 day supply  Medication has been discontinued due to history of hyperkalemia. No further action needed at this time.

## 2023-02-15 ENCOUNTER — Ambulatory Visit: Payer: HMO | Admitting: Internal Medicine

## 2023-02-17 DIAGNOSIS — M10071 Idiopathic gout, right ankle and foot: Secondary | ICD-10-CM | POA: Diagnosis not present

## 2023-02-17 DIAGNOSIS — N183 Chronic kidney disease, stage 3 unspecified: Secondary | ICD-10-CM | POA: Diagnosis not present

## 2023-02-27 DIAGNOSIS — Z03818 Encounter for observation for suspected exposure to other biological agents ruled out: Secondary | ICD-10-CM | POA: Diagnosis not present

## 2023-02-27 DIAGNOSIS — J069 Acute upper respiratory infection, unspecified: Secondary | ICD-10-CM | POA: Diagnosis not present

## 2023-02-27 DIAGNOSIS — B338 Other specified viral diseases: Secondary | ICD-10-CM | POA: Diagnosis not present

## 2023-03-02 ENCOUNTER — Ambulatory Visit: Admission: RE | Admit: 2023-03-02 | Payer: HMO | Source: Ambulatory Visit

## 2023-03-02 SURGERY — ESOPHAGOGASTRODUODENOSCOPY (EGD) WITH PROPOFOL
Anesthesia: General

## 2023-03-04 ENCOUNTER — Encounter: Payer: Self-pay | Admitting: Internal Medicine

## 2023-03-04 ENCOUNTER — Ambulatory Visit
Admission: RE | Admit: 2023-03-04 | Discharge: 2023-03-04 | Disposition: A | Discharge status: Home / Self Care | Payer: HMO | Attending: Internal Medicine | Admitting: Internal Medicine

## 2023-03-04 ENCOUNTER — Ambulatory Visit
Admission: RE | Admit: 2023-03-04 | Discharge: 2023-03-04 | Disposition: A | Discharge status: Home / Self Care | Payer: HMO | Source: Ambulatory Visit | Attending: Internal Medicine | Admitting: Internal Medicine

## 2023-03-04 ENCOUNTER — Ambulatory Visit: Payer: HMO | Admitting: Internal Medicine

## 2023-03-04 VITALS — BP 138/78 | HR 74 | Temp 98.0°F | Resp 16 | Ht 64.0 in | Wt 144.0 lb

## 2023-03-04 DIAGNOSIS — E039 Hypothyroidism, unspecified: Secondary | ICD-10-CM

## 2023-03-04 DIAGNOSIS — E875 Hyperkalemia: Secondary | ICD-10-CM | POA: Diagnosis not present

## 2023-03-04 DIAGNOSIS — R739 Hyperglycemia, unspecified: Secondary | ICD-10-CM | POA: Diagnosis not present

## 2023-03-04 DIAGNOSIS — D509 Iron deficiency anemia, unspecified: Secondary | ICD-10-CM

## 2023-03-04 DIAGNOSIS — I10 Essential (primary) hypertension: Secondary | ICD-10-CM | POA: Diagnosis not present

## 2023-03-04 DIAGNOSIS — I4891 Unspecified atrial fibrillation: Secondary | ICD-10-CM | POA: Diagnosis not present

## 2023-03-04 DIAGNOSIS — R059 Cough, unspecified: Secondary | ICD-10-CM | POA: Diagnosis not present

## 2023-03-04 DIAGNOSIS — E7800 Pure hypercholesterolemia, unspecified: Secondary | ICD-10-CM

## 2023-03-04 DIAGNOSIS — I700 Atherosclerosis of aorta: Secondary | ICD-10-CM

## 2023-03-04 DIAGNOSIS — K760 Fatty (change of) liver, not elsewhere classified: Secondary | ICD-10-CM

## 2023-03-04 DIAGNOSIS — R0989 Other specified symptoms and signs involving the circulatory and respiratory systems: Secondary | ICD-10-CM | POA: Diagnosis not present

## 2023-03-04 DIAGNOSIS — R062 Wheezing: Secondary | ICD-10-CM | POA: Diagnosis not present

## 2023-03-04 DIAGNOSIS — N184 Chronic kidney disease, stage 4 (severe): Secondary | ICD-10-CM

## 2023-03-04 LAB — CBC WITH DIFFERENTIAL/PLATELET
Basophils Absolute: 0 K/uL (ref 0.0–0.1)
Basophils Relative: 0.5 % (ref 0.0–3.0)
Eosinophils Absolute: 0.1 K/uL (ref 0.0–0.7)
Eosinophils Relative: 0.7 % (ref 0.0–5.0)
HCT: 38.3 % (ref 36.0–46.0)
Hemoglobin: 12.7 g/dL (ref 12.0–15.0)
Lymphocytes Relative: 32.1 % (ref 12.0–46.0)
Lymphs Abs: 3.2 K/uL (ref 0.7–4.0)
MCHC: 33.1 g/dL (ref 30.0–36.0)
MCV: 91.1 fl (ref 78.0–100.0)
Monocytes Absolute: 0.9 K/uL (ref 0.1–1.0)
Monocytes Relative: 9 % (ref 3.0–12.0)
Neutro Abs: 5.8 K/uL (ref 1.4–7.7)
Neutrophils Relative %: 57.7 % (ref 43.0–77.0)
Platelets: 182 K/uL (ref 150.0–400.0)
RBC: 4.21 Mil/uL (ref 3.87–5.11)
RDW: 13.4 % (ref 11.5–15.5)
WBC: 10.1 K/uL (ref 4.0–10.5)

## 2023-03-04 LAB — BASIC METABOLIC PANEL
BUN: 34 mg/dL — ABNORMAL HIGH (ref 6–23)
CO2: 26 meq/L (ref 19–32)
Calcium: 10.3 mg/dL (ref 8.4–10.5)
Chloride: 98 meq/L (ref 96–112)
Creatinine, Ser: 1.85 mg/dL — ABNORMAL HIGH (ref 0.40–1.20)
GFR: 25.19 mL/min — ABNORMAL LOW (ref >60.00)
Glucose, Bld: 88 mg/dL (ref 70–99)
Potassium: 4.4 meq/L (ref 3.5–5.1)
Sodium: 135 meq/L (ref 135–145)

## 2023-03-04 LAB — IBC + FERRITIN
Ferritin: 108.4 ng/mL (ref 10.0–291.0)
Iron: 105 ug/dL (ref 42–145)
Saturation Ratios: 39.3 % (ref 20.0–50.0)
TIBC: 267.4 ug/dL (ref 250.0–450.0)
Transferrin: 191 mg/dL — ABNORMAL LOW (ref 212.0–360.0)

## 2023-03-04 LAB — LIPID PANEL
Cholesterol: 172 mg/dL (ref 0–200)
HDL: 49.5 mg/dL
LDL Cholesterol: 76 mg/dL (ref 0–99)
NonHDL: 122.03
Total CHOL/HDL Ratio: 3
Triglycerides: 228 mg/dL — ABNORMAL HIGH (ref 0.0–149.0)
VLDL: 45.6 mg/dL — ABNORMAL HIGH (ref 0.0–40.0)

## 2023-03-04 LAB — HEPATIC FUNCTION PANEL
ALT: 23 U/L (ref 0–35)
AST: 26 U/L (ref 0–37)
Albumin: 3.9 g/dL (ref 3.5–5.2)
Alkaline Phosphatase: 98 U/L (ref 39–117)
Bilirubin, Direct: 0.2 mg/dL (ref 0.0–0.3)
Total Bilirubin: 0.7 mg/dL (ref 0.2–1.2)
Total Protein: 6.6 g/dL (ref 6.0–8.3)

## 2023-03-04 LAB — VITAMIN B12: Vitamin B-12: 561 pg/mL (ref 211–911)

## 2023-03-04 LAB — HEMOGLOBIN A1C: Hgb A1c MFr Bld: 6.1 % (ref 4.6–6.5)

## 2023-03-04 MED ORDER — PREDNISONE 10 MG PO TABS: ORAL_TABLET | ORAL | 0 refills | Status: DC

## 2023-03-04 MED ORDER — LEVALBUTEROL TARTRATE 45 MCG/ACT IN AERO: 2.0000 | INHALATION_SPRAY | Freq: Four times a day (QID) | RESPIRATORY_TRACT | 0 refills | Status: DC | PRN

## 2023-03-04 MED ORDER — BENZONATATE 100 MG PO CAPS: 100.0000 mg | ORAL_CAPSULE | Freq: Three times a day (TID) | ORAL | 0 refills | Status: DC | PRN

## 2023-03-04 NOTE — Progress Notes (Signed)
Subjective:    Patient ID: Caitlin Lin, female    DOB: 1940-12-29, 82 y.o.   MRN: [id number]  Patient here for  Chief Complaint  Patient presents with   Medical Management of Chronic Issues    HPI Here for a physical exam. Was scheduled for physical, but given acute issues appt was changed to a follow up appt. Was evaluated acute care 02/27/23 - diagnosed with RSV. Symptoms started 02/24/23 - nasal congestion, PND, rhinorrhea and cough. Husband with RSV. Recommended flonase, promethazine dextromethorphan and short course of prednisone. She reports there her sore throat is better.  Feeling some better, but the congestion has moved into her chest.  Chest feels more tight.  Able to walk around her house without increased sob. Increased cough and coughing fits. Wheezing. No nausea or vomiting. No diarrhea. No chest pain. Also evaluated acute care 02/17/23 - foot pain. Diagnosed with gout. Treated with prednisone and tramadol. Had f/u with nephrology 12/31/22 - Dr Eulah Pont - recommend starting on chlorthalidone - blood pressure elevation. Blood pressure doing well. Due labs.    Past Medical History:  Diagnosis Date   Cataracts, bilateral    Chronic kidney disease    Followed by Dr. Cherylann Ratel   Chronic kidney disease    Colon polyp    Constipation due to slow transit    Diarrhea    Diarrhea    GERD (gastroesophageal reflux disease)    Heart murmur    History of cyst of breast    History of hiatal hernia    Hyperlipidemia    Hypertension    Hypothyroidism    Melanoma of lower leg (HCC) 2013   Shingles 2013   Shingles    Spinal stenosis in cervical region    Dr. Yves Dill   Subdural hematoma [facility])    Thyroid disease    Past Surgical History:  Procedure Laterality Date   ABDOMINAL HYSTERECTOMY  1974   ANTERIOR AND POSTERIOR VAGINAL REPAIR     ANTERIOR AND POSTERIOR VAGINAL REPAIR W/ SACROSPINOUS LIGAMENT SUSPENSION Right    BREAST BIOPSY  1963   Benign per pt's report   BREAST  EXCISIONAL BIOPSY Right 1963   neg   COLONOSCOPY     COLONOSCOPY N/A 10/29/2021   Procedure: COLONOSCOPY;  Surgeon: Toledo, Boykin Nearing, MD;  Location: [facility] ENDOSCOPY;  Service: Gastroenterology;  Laterality: N/A;   ELECTROMAGNETIC NAVIGATION BROCHOSCOPY Right 01/09/2019   Procedure: ELECTROMAGNETIC NAVIGATION BRONCHOSCOPY;  Surgeon: Salena Saner, MD;  Location: [facility] ORS;  Service: Cardiopulmonary;  Laterality: Right;   ESOPHAGOGASTRODUODENOSCOPY     ESOPHAGOGASTRODUODENOSCOPY (EGD) WITH PROPOFOL N/A 11/10/2016   Procedure: ESOPHAGOGASTRODUODENOSCOPY (EGD) WITH PROPOFOL;  Surgeon: Christena Deem, MD;  Location: [facility] ENDOSCOPY;  Service: Endoscopy;  Laterality: N/A;   ESOPHAGOGASTRODUODENOSCOPY (EGD) WITH PROPOFOL N/A 09/09/2018   Procedure: ESOPHAGOGASTRODUODENOSCOPY (EGD) WITH PROPOFOL;  Surgeon: Christena Deem, MD;  Location: [facility] ENDOSCOPY;  Service: Endoscopy;  Laterality: N/A;   ESOPHAGOGASTRODUODENOSCOPY (EGD) WITH PROPOFOL N/A 10/27/2019   Procedure: ESOPHAGOGASTRODUODENOSCOPY (EGD) WITH PROPOFOL;  Surgeon: Earline Mayotte, MD;  Location: [facility] ENDOSCOPY;  Service: Endoscopy;  Laterality: N/A;   ESOPHAGOGASTRODUODENOSCOPY (EGD) WITH PROPOFOL N/A 08/09/2020   Procedure: ESOPHAGOGASTRODUODENOSCOPY (EGD) WITH PROPOFOL;  Surgeon: Regis Bill, MD;  Location: [facility] ENDOSCOPY;  Service: Endoscopy;  Laterality: N/A;   LUNG BIOPSY  2007   Benign per pt's report   LUNG BIOPSY Right    OOPHORECTOMY  1970   PERINEOPLASTY  12/17/2013   sling for stress incontinence  12/18/2011  Family History  Problem Relation Age of Onset   Arthritis Mother    Liver disease Mother    Cirrhosis Mother    Liver cancer Mother    Heart disease Father    Heart attack Father    Coronary artery disease Father    Depression Brother    Prostate cancer Brother    Diabetes Maternal Aunt    Breast cancer Cousin        maternal cousins   Liver cancer Cousin    Breast cancer Other    Social  History   Socioeconomic History   Marital status: Married    Spouse name: Not on file   Number of children: 2   Years of education: Not on file   Highest education level: Not on file  Occupational History   Occupation: Retired  Tobacco Use   Smoking status: Never   Smokeless tobacco: Never  Vaping Use   Vaping status: Never Used  Substance and Sexual Activity   Alcohol use: No   Drug use: No   Sexual activity: Yes  Other Topics Concern   Not on file  Social History Narrative   Lives in [place] with husband. Has 2 children boy and girl, 4 grandchildren. Retired Insurance underwriter.       Daily Caffeine Use:  NO   Regular Exercise -  Not at this time due to back pain, would like to resume soon   Social Drivers of Corporate investment banker Strain: Low Risk  (12/01/2022)   Overall Financial Resource Strain (CARDIA)    Difficulty of Paying Living Expenses: Not hard at all  Food Insecurity: No Food Insecurity (12/01/2022)   Hunger Vital Sign    Worried About Running Out of Food in the Last Year: Never true    Ran Out of Food in the Last Year: Never true  Transportation Needs: No Transportation Needs (12/01/2022)   PRAPARE - Administrator, Civil Service (Medical): No    Lack of Transportation (Non-Medical): No  Physical Activity: Sufficiently Active (12/01/2022)   Exercise Vital Sign    Days of Exercise per Week: 5 days    Minutes of Exercise per Session: 40 min  Stress: No Stress Concern Present (12/01/2022)   [employer] of Occupational Health - Occupational Stress Questionnaire    Feeling of Stress : Only a little  Social Connections: Socially Integrated (12/01/2022)   Social Connection and Isolation Panel [NHANES]    Frequency of Communication with Friends and Family: More than three times a week    Frequency of Social Gatherings with Friends and Family: Three times a week    Attends Religious Services: More than 4 times per year    Active Member of Clubs or  Organizations: Yes    Attends Banker Meetings: More than 4 times per year    Marital Status: Married     Review of Systems  Constitutional:  Negative for appetite change and fever.       No fever now.   HENT:  Positive for congestion and postnasal drip. Negative for sinus pressure.        Head congestion.   Respiratory:  Positive for cough, chest tightness and wheezing.        No increased sob.   Cardiovascular:  Negative for chest pain and palpitations.  Gastrointestinal:  Negative for abdominal pain, diarrhea, nausea and vomiting.  Genitourinary:  Negative for difficulty urinating and dysuria.  Musculoskeletal:  Negative for joint  swelling and myalgias.  Skin:  Negative for color change and rash.  Neurological:  Negative for dizziness and headaches.  Psychiatric/Behavioral:  Negative for agitation and dysphoric mood.        Objective:     BP 138/78   Pulse 74   Temp 98 F (36.7 C)   Resp 16   Ht 5\' 4"  (1.626 m)   Wt 144 lb (65.3 kg)   SpO2 98%   BMI 24.72 kg/m  Wt Readings from Last 3 Encounters:  03/04/23 144 lb (65.3 kg)  12/30/22 145 lb (65.8 kg)  12/02/22 147 lb 6.4 oz (66.9 kg)    Physical Exam Vitals reviewed.  Constitutional:      General: She is not in acute distress.    Appearance: Normal appearance.  HENT:     Head: Normocephalic and atraumatic.     Right Ear: External ear normal.     Left Ear: External ear normal.     Mouth/Throat:     Pharynx: No oropharyngeal exudate or posterior oropharyngeal erythema.  Eyes:     General: No scleral icterus.       Right eye: No discharge.        Left eye: No discharge.     Conjunctiva/sclera: Conjunctivae normal.  Neck:     Thyroid: No thyromegaly.  Cardiovascular:     Rate and Rhythm: Normal rate and regular rhythm.  Pulmonary:     Effort: No respiratory distress.     Comments: Increased cough with expiration.  Wheezing.  Abdominal:     General: Bowel sounds are normal.     Palpations:  Abdomen is soft.     Tenderness: There is no abdominal tenderness.  Musculoskeletal:        General: No swelling or tenderness.     Cervical back: Neck supple. No tenderness.  Lymphadenopathy:     Cervical: No cervical adenopathy.  Skin:    Findings: No erythema or rash.  Neurological:     Mental Status: She is alert.  Psychiatric:        Mood and Affect: Mood normal.        Behavior: Behavior normal.      Outpatient Encounter Medications as of 03/04/2023  Medication Sig   benzonatate (TESSALON PERLES) 100 MG capsule Take 1 capsule (100 mg total) by mouth 3 (three) times daily as needed for cough.   levalbuterol (XOPENEX HFA) 45 MCG/ACT inhaler Inhale 2 puffs into the lungs every 6 (six) hours as needed for wheezing.   predniSONE (DELTASONE) 10 MG tablet Take 6 tablets x 1 day and then decrease by 1/2 tablet per day until down to zero mg.   Alpha-Lipoic Acid 200 MG TABS Take 200 mg by mouth 3 (three) times daily.   amLODipine (NORVASC) 5 MG tablet Take 1 tablet (5 mg total) by mouth daily.   aspirin 81 MG tablet Take 1 tablet (81 mg total) by mouth daily.   atorvastatin (LIPITOR) 20 MG tablet Take 1 tablet (20 mg total) by mouth daily.   Cholecalciferol 25 MCG (1000 UT) tablet Take 1,000 Units by mouth daily.   Docusate Calcium (STOOL SOFTENER PO) Take by mouth.   levothyroxine (SYNTHROID) 75 MCG tablet TAKE 1 TABLET BY MOUTH ONCE DAILY IN THE MORNING BEFORE BREAKFAST   Multiple Vitamin (MULTIVITAMIN) tablet Take 1 tablet by mouth daily.   Omega-3 Fatty Acids (FISH OIL) 1000 MG CAPS Take 1,000 mg by mouth daily.   pantoprazole (PROTONIX) 40 MG tablet Take 1  tablet (40 mg total) by mouth daily. Take 30 minutes before breakfast   Polyethylene Glycol 3350 (MIRALAX PO) Take by mouth as needed.   Probiotic Product (PROBIOTIC DAILY PO) Take by mouth daily.   trimethoprim (TRIMPEX) 100 MG tablet Take 1 tablet (100 mg total) by mouth daily.   Wheat Dextrin (BENEFIBER PO) Take by mouth.    No facility-administered encounter medications on file as of 03/04/2023.     Lab Results  Component Value Date   WBC 5.3 11/27/2022   HGB 11.4 (L) 11/27/2022   HCT 34.4 (L) 11/27/2022   PLT 162 11/27/2022   GLUCOSE 95 12/30/2022   CHOL 153 10/28/2022   TRIG 171.0 (H) 10/28/2022   HDL 38.60 (L) 10/28/2022   LDLDIRECT 80.0 01/05/2022   LDLCALC 80 10/28/2022   ALT 17 10/28/2022   AST 24 10/28/2022   NA 138 12/30/2022   K 4.6 12/30/2022   CL 106 12/30/2022   CREATININE 1.96 (H) 12/30/2022   BUN 26 (H) 12/30/2022   CO2 24 12/30/2022   TSH 0.88 10/08/2022   INR 1.1 06/18/2022   HGBA1C 5.8 10/28/2022   MICROALBUR 2.1 (H) 03/27/2015    No results found.     Assessment & Plan:  Primary hypertension Assessment & Plan: Blood pressure as outlined. On amlodipine. Follow pressures.  Follow metabolic panel.   Orders: -     Basic metabolic panel  Hyperkalemia Assessment & Plan: Off micardis.  Back on trimethoprim - per urology. Recheck potassium today.    Hyperglycemia Assessment & Plan: Low carb diet and exercise.  Follow met b and a1c.    Orders: -     Hemoglobin A1c  Hypercholesterolemia Assessment & Plan: Continue lipitor.  Low cholesterol diet and exercise.  Follow lipid panel and liver function tests.    Orders: -     Hepatic function panel -     Lipid panel  Iron deficiency anemia, unspecified iron deficiency anemia type Assessment & Plan: Follow cbc. Check today and check iron studies.   Orders: -     CBC with Differential/Platelet -     Vitamin B12 -     IBC + Ferritin  Cough, unspecified type Assessment & Plan: Persistent increased cough, congestion and wheezing.  Some tightness in her chest. Diagnosed with RSV. Exam as outlined.  Will check cxr. Treat with prednisone taper.  Xopenx inhaler as directed.  Robitussin DM.  Flonase nasal spray.  Follow.  Hold abx until cxr review.   Orders: -     DG Chest 2 View; Future  Aortic atherosclerosis  (HCC) Assessment & Plan: Continue lipitor.    Atrial fibrillation, unspecified type [facility]) Assessment & Plan: Was found to have new onset afib with RVR (up to 186) - during recent hospitalization. Was given IV cardizem - followed by oral cardizem.  CXR negative.  Dr Kirke Corin consulted. Found to have UTI - E.coli pan sensitive.  IV abx and discharged on cipro for 10 days total abx.  Not started on anticoagulants.  Was felt afib - related to acute illness.  Zio monitor placed.  Discharged on metoprolol. Had f/u with cardiology 08/21/22. No further arrhythmias noted. Previous myoview - no ischemia.  Echo with normal EF.  Recommended continuing aspirin and statin. Hold anticoagulation - lone afib in the setting of UTI sepsis.  Overall doing well. Appears to be in SR today.    CKD (chronic kidney disease) stage 4, GFR 15-29 ml/min (HCC) Assessment & Plan: Off micardis.  On  amlodipine.  Blood pressure as outlined.   Back on trimethoprim per urology.  Watching diet.  Check metabolic panel. Seeing nephrology. Had mentioned starting chlorthalidone.     Fatty liver Assessment & Plan: Follow liver panel.    Hypothyroidism, unspecified type Assessment & Plan: On thyroid replacement.  Follow tsh.    Other orders -     predniSONE; Take 6 tablets x 1 day and then decrease by 1/2 tablet per day until down to zero mg.  Dispense: 39 tablet; Refill: 0 -     Levalbuterol Tartrate; Inhale 2 puffs into the lungs every 6 (six) hours as needed for wheezing.  Dispense: 1 each; Refill: 0 -     Benzonatate; Take 1 capsule (100 mg total) by mouth 3 (three) times daily as needed for cough.  Dispense: 21 capsule; Refill: 0     [place], MD

## 2023-03-04 NOTE — Assessment & Plan Note (Signed)
Low carb diet and exercise.  Follow met b and a1c.   

## 2023-03-04 NOTE — Assessment & Plan Note (Signed)
Blood pressure as outlined.  On amlodipine.  Follow pressures.  Follow metabolic panel.  

## 2023-03-04 NOTE — Assessment & Plan Note (Signed)
Off micardis.  On amlodipine.  Blood pressure as outlined.   Back on trimethoprim per urology.  Watching diet.  Check metabolic panel. Seeing nephrology. Had mentioned starting chlorthalidone.

## 2023-03-04 NOTE — Assessment & Plan Note (Signed)
On thyroid replacement.  Follow tsh.  

## 2023-03-04 NOTE — Assessment & Plan Note (Signed)
Continue lipitor.  Low cholesterol diet and exercise.  Follow lipid panel and liver function tests.   

## 2023-03-04 NOTE — Assessment & Plan Note (Signed)
Off micardis.  Back on trimethoprim - per urology. Recheck potassium today.

## 2023-03-04 NOTE — Assessment & Plan Note (Signed)
Follow liver panel.  

## 2023-03-04 NOTE — Assessment & Plan Note (Addendum)
Follow cbc. Check today and check iron studies.

## 2023-03-04 NOTE — Assessment & Plan Note (Signed)
Was found to have new onset afib with RVR (up to 186) - during recent hospitalization. Was given IV cardizem - followed by oral cardizem.  CXR negative.  Dr Kirke Corin consulted. Found to have UTI - E.coli pan sensitive.  IV abx and discharged on cipro for 10 days total abx.  Not started on anticoagulants.  Was felt afib - related to acute illness.  Zio monitor placed.  Discharged on metoprolol. Had f/u with cardiology 08/21/22. No further arrhythmias noted. Previous myoview - no ischemia.  Echo with normal EF.  Recommended continuing aspirin and statin. Hold anticoagulation - lone afib in the setting of UTI sepsis.  Overall doing well. Appears to be in SR today.

## 2023-03-04 NOTE — Assessment & Plan Note (Signed)
Continue lipitor  ?

## 2023-03-04 NOTE — Assessment & Plan Note (Signed)
Persistent increased cough, congestion and wheezing.  Some tightness in her chest. Diagnosed with RSV. Exam as outlined.  Will check cxr. Treat with prednisone taper.  Xopenx inhaler as directed.  Robitussin DM.  Flonase nasal spray.  Follow.  Hold abx until cxr review.

## 2023-03-16 DIAGNOSIS — B338 Other specified viral diseases: Secondary | ICD-10-CM | POA: Diagnosis not present

## 2023-03-16 DIAGNOSIS — J019 Acute sinusitis, unspecified: Secondary | ICD-10-CM | POA: Diagnosis not present

## 2023-03-16 DIAGNOSIS — Z03818 Encounter for observation for suspected exposure to other biological agents ruled out: Secondary | ICD-10-CM | POA: Diagnosis not present

## 2023-03-29 ENCOUNTER — Encounter: Payer: Self-pay | Admitting: Urology

## 2023-03-29 ENCOUNTER — Ambulatory Visit (INDEPENDENT_AMBULATORY_CARE_PROVIDER_SITE_OTHER): Payer: HMO | Admitting: Urology

## 2023-03-29 VITALS — BP 158/80 | HR 88 | Ht 64.0 in | Wt 146.0 lb

## 2023-03-29 DIAGNOSIS — N3946 Mixed incontinence: Secondary | ICD-10-CM

## 2023-03-29 LAB — MICROSCOPIC EXAMINATION: WBC, UA: >30 /[HPF] — AB (ref 0–5)

## 2023-03-29 LAB — URINALYSIS, COMPLETE
Bilirubin, UA: NEGATIVE
Glucose, UA: NEGATIVE
Nitrite, UA: NEGATIVE
Protein,UA: NEGATIVE
Specific Gravity, UA: 1.01 (ref 1.005–1.030)
Urobilinogen, Ur: 0.2 mg/dL (ref 0.2–1.0)
pH, UA: 5.5 (ref 5.0–7.5)

## 2023-03-29 MED ORDER — GEMTESA 75 MG PO TABS: 1.0000 | ORAL_TABLET | Freq: Every day | ORAL | Status: DC

## 2023-03-29 NOTE — Addendum Note (Signed)
 Addended by: Sueanne Margarita on: 03/29/2023 10:12 AM   Modules accepted: Orders

## 2023-03-29 NOTE — Progress Notes (Signed)
 03/29/2023 9:06 AM   Caitlin Lin Shed 1940-05-01 [id number]  Referring provider: Glendia Shad, MD [address] [place],  [place] [address]  No chief complaint on file.   HPI: Reviewed lengthy note.  Culture from September  negative.  I put her back on trimethoprim  since I did not think was related to the elevated potassium.  At baseline she was wearing 4-5 pads a day that were damp but she is quite fastidious with mixed dressers incontinence.  She sometimes leaks with urgency and sometimes leaks with coughing sneezing.  She has had some fecal incontinence not associated with awareness she has a small grade 3 cystocele hinging over the bladder neck.  She had diffuse cystitis cystica and cloudy urine on cystoscopy.  Patient still has a mixed incontinence but clinically is not infected.  She is pleased.  Frequency stable.  Incontinence affecting her quality of life.   PMH: Past Medical History:  Diagnosis Date   Cataracts, bilateral    Chronic kidney disease    Followed by Dr. Marcelino   Chronic kidney disease    Colon polyp    Constipation due to slow transit    Diarrhea    Diarrhea    GERD (gastroesophageal reflux disease)    Heart murmur    History of cyst of breast    History of hiatal hernia    Hyperlipidemia    Hypertension    Hypothyroidism    Melanoma of lower leg (HCC) 2013   Shingles 2013   Shingles    Spinal stenosis in cervical region    Dr. Avanell   Subdural hematoma [facility])    Thyroid  disease     Surgical History: Past Surgical History:  Procedure Laterality Date   ABDOMINAL HYSTERECTOMY  1974   ANTERIOR AND POSTERIOR VAGINAL REPAIR     ANTERIOR AND POSTERIOR VAGINAL REPAIR W/ SACROSPINOUS LIGAMENT SUSPENSION Right    BREAST BIOPSY  1963   Benign per pt's report   BREAST EXCISIONAL BIOPSY Right 1963   neg   COLONOSCOPY     COLONOSCOPY N/A 10/29/2021   Procedure: COLONOSCOPY;  Surgeon: Toledo, Ladell POUR, MD;  Location: [facility]  ENDOSCOPY;  Service: Gastroenterology;  Laterality: N/A;   ELECTROMAGNETIC NAVIGATION BROCHOSCOPY Right 01/09/2019   Procedure: ELECTROMAGNETIC NAVIGATION BRONCHOSCOPY;  Surgeon: Tamea Dedra CROME, MD;  Location: [facility] ORS;  Service: Cardiopulmonary;  Laterality: Right;   ESOPHAGOGASTRODUODENOSCOPY     ESOPHAGOGASTRODUODENOSCOPY (EGD) WITH PROPOFOL  N/A 11/10/2016   Procedure: ESOPHAGOGASTRODUODENOSCOPY (EGD) WITH PROPOFOL ;  Surgeon: Gaylyn Gladis PENNER, MD;  Location: [facility] ENDOSCOPY;  Service: Endoscopy;  Laterality: N/A;   ESOPHAGOGASTRODUODENOSCOPY (EGD) WITH PROPOFOL  N/A 09/09/2018   Procedure: ESOPHAGOGASTRODUODENOSCOPY (EGD) WITH PROPOFOL ;  Surgeon: Gaylyn Gladis PENNER, MD;  Location: [facility] ENDOSCOPY;  Service: Endoscopy;  Laterality: N/A;   ESOPHAGOGASTRODUODENOSCOPY (EGD) WITH PROPOFOL  N/A 10/27/2019   Procedure: ESOPHAGOGASTRODUODENOSCOPY (EGD) WITH PROPOFOL ;  Surgeon: Dessa Reyes ORN, MD;  Location: [facility] ENDOSCOPY;  Service: Endoscopy;  Laterality: N/A;   ESOPHAGOGASTRODUODENOSCOPY (EGD) WITH PROPOFOL  N/A 08/09/2020   Procedure: ESOPHAGOGASTRODUODENOSCOPY (EGD) WITH PROPOFOL ;  Surgeon: Maryruth Ole DASEN, MD;  Location: [facility] ENDOSCOPY;  Service: Endoscopy;  Laterality: N/A;   LUNG BIOPSY  2007   Benign per pt's report   LUNG BIOPSY Right    OOPHORECTOMY  1970   PERINEOPLASTY  12/17/2013   sling for stress incontinence  12/18/2011    Home Medications:  Allergies as of 03/29/2023       Reactions   Quinapril  Other (See Comments)   Hyperkalemia  Codeine Rash        Medication List        Accurate as of March 29, 2023  9:06 AM. If you have any questions, ask your nurse or doctor.          Alpha-Lipoic Acid 200 MG Tabs Take 200 mg by mouth 3 (three) times daily.   amLODipine  5 MG tablet Commonly known as: NORVASC  Take 1 tablet (5 mg total) by mouth daily.   aspirin  81 MG tablet Take 1 tablet (81 mg total) by mouth daily.   atorvastatin  20 MG tablet Commonly  known as: LIPITOR Take 1 tablet (20 mg total) by mouth daily.   BENEFIBER PO Take by mouth.   benzonatate  100 MG capsule Commonly known as: Tessalon  Perles Take 1 capsule (100 mg total) by mouth 3 (three) times daily as needed for cough.   Cholecalciferol  25 MCG (1000 UT) tablet Take 1,000 Units by mouth daily.   Fish Oil  1000 MG Caps Take 1,000 mg by mouth daily.   levalbuterol  45 MCG/ACT inhaler Commonly known as: Xopenex  HFA Inhale 2 puffs into the lungs every 6 (six) hours as needed for wheezing.   levothyroxine  75 MCG tablet Commonly known as: SYNTHROID  TAKE 1 TABLET BY MOUTH ONCE DAILY IN THE MORNING BEFORE BREAKFAST   MIRALAX PO Take by mouth as needed.   multivitamin tablet Take 1 tablet by mouth daily.   pantoprazole  40 MG tablet Commonly known as: PROTONIX  Take 1 tablet (40 mg total) by mouth daily. Take 30 minutes before breakfast   predniSONE  10 MG tablet Commonly known as: DELTASONE  Take 6 tablets x 1 day and then decrease by 1/2 tablet per day until down to zero mg.   PROBIOTIC DAILY PO Take by mouth daily.   STOOL SOFTENER PO Take by mouth.   trimethoprim  100 MG tablet Commonly known as: TRIMPEX  Take 1 tablet (100 mg total) by mouth daily.        Allergies:  Allergies  Allergen Reactions   Quinapril  Other (See Comments)    Hyperkalemia   Codeine Rash    Family History: Family History  Problem Relation Age of Onset   Arthritis Mother    Liver disease Mother    Cirrhosis Mother    Liver cancer Mother    Heart disease Father    Heart attack Father    Coronary artery disease Father    Depression Brother    Prostate cancer Brother    Diabetes Maternal Aunt    Breast cancer Cousin        maternal cousins   Liver cancer Cousin    Breast cancer Other     Social History:  reports that she has never smoked. She has never used smokeless tobacco. She reports that she does not drink alcohol and does not use drugs.  ROS:                                         Physical Exam: There were no vitals taken for this visit.  Constitutional:  Alert and oriented, No acute distress. HEENT: [place] AT, moist mucus membranes.  Trachea midline, no masses.   Laboratory Data: Lab Results  Component Value Date   WBC 10.1 03/04/2023   HGB 12.7 03/04/2023   HCT 38.3 03/04/2023   MCV 91.1 03/04/2023   PLT 182.0 03/04/2023    Lab Results  Component Value  Date   CREATININE 1.85 (H) 03/04/2023    No results found for: PSA  No results found for: TESTOSTERONE  Lab Results  Component Value Date   HGBA1C 6.1 03/04/2023    Urinalysis    Component Value Date/Time   COLORURINE COLORLESS (A) 11/27/2022 1302   APPEARANCEUR Clear 11/30/2022 0920   LABSPEC 1.002 (L) 11/27/2022 1302   PHURINE 7.0 11/27/2022 1302   GLUCOSEU Negative 11/30/2022 0920   GLUCOSEU NEGATIVE 07/22/2018 0839   HGBUR NEGATIVE 11/27/2022 1302   BILIRUBINUR Negative 11/30/2022 0920   KETONESUR NEGATIVE 11/27/2022 1302   PROTEINUR Negative 11/30/2022 0920   PROTEINUR NEGATIVE 11/27/2022 1302   UROBILINOGEN 0.2 07/22/2018 0839   NITRITE Negative 11/30/2022 0920   NITRITE NEGATIVE 11/27/2022 1302   LEUKOCYTESUR Trace (A) 11/30/2022 0920   LEUKOCYTESUR NEGATIVE 11/27/2022 1302    Pertinent Imaging: Urine reviewed and sent for culture  Assessment & Plan: Reassess in 6 weeks on Gemtesa  samples and prescription and call if culture positive.  Stay on trimethoprim   There are no diagnoses linked to this encounter.  No follow-ups on file.  Glendia DELENA Elizabeth, MD  [facility] [address], [address] [place], [place] [address] [phone]

## 2023-03-31 LAB — CULTURE, URINE COMPREHENSIVE

## 2023-04-06 DIAGNOSIS — H903 Sensorineural hearing loss, bilateral: Secondary | ICD-10-CM | POA: Diagnosis not present

## 2023-04-23 ENCOUNTER — Ambulatory Visit: Payer: HMO | Admitting: Pulmonary Disease

## 2023-04-23 ENCOUNTER — Encounter: Payer: Self-pay | Admitting: Pulmonary Disease

## 2023-04-23 VITALS — BP 138/80 | HR 75 | Temp 97.5°F | Ht 64.0 in | Wt 149.2 lb

## 2023-04-23 DIAGNOSIS — B338 Other specified viral diseases: Secondary | ICD-10-CM

## 2023-04-23 DIAGNOSIS — J8410 Pulmonary fibrosis, unspecified: Secondary | ICD-10-CM

## 2023-04-23 NOTE — Patient Instructions (Signed)
 VISIT SUMMARY:  Caitlin Lin, today we reviewed your recent health concerns, including your past lung nodule and recent RSV infection. Your lung nodule has resolved into a scar(granuloma), and your recent RSV infection is improving with your current treatment. We discussed your ongoing symptoms and general health maintenance.  YOUR PLAN:  -RSV INFECTION: RSV is a respiratory virus that can cause symptoms like a cold. You recently had an RSV infection, and while you still have some post-nasal drip and a mild cough, your symptoms are improving. Continue using the nasal saline wash and sinus nasal spray as directed. Monitor your symptoms and return if they persist or worsen.  -RESOLVED LUNG NODULE (GRANULOMA): A granuloma is a small area of inflammation due to infection or irritants. Your previously identified lung nodule has resolved into a scar, and your recent chest x-ray showed no pneumonia. No further action is needed unless you develop new respiratory symptoms. Return for evaluation if you notice any new or persistent respiratory issues.  -GENERAL HEALTH MAINTENANCE: Your lungs are clear, and you do not use tobacco. Continue to monitor your health and return if you develop any new symptoms. Use MyChart for future communication and health management.  INSTRUCTIONS:  Continue using the nasal saline wash and sinus nasal spray as directed. Monitor your symptoms and return if they persist or worsen. No further action is needed for the resolved lung nodule unless new respiratory symptoms develop.

## 2023-04-23 NOTE — Progress Notes (Signed)
 Subjective:    Patient ID: Caitlin Lin, female    DOB: 05-04-1940, 83 y.o.   MRN: [id number]  Patient Care Team: Glendia Shad, MD as PCP - General (Internal Medicine) Darliss Rogue, MD as PCP - Cardiology (Cardiology) Jane Delmar Pike, NP as Nurse Practitioner (Gastroenterology) Henriette Clotilda CROME, MD as Consulting Physician (Nephrology) Gaston Glendia, MD as Consulting Physician (Urology) Aundria Ladell POUR, MD as Consulting Physician (Gastroenterology)  Chief Complaint  Patient presents with   Follow-up    SOB. No wheezing. Cough, dry. Had RSV on 02/26/2023 for 2 weeks.    BACKGROUND/INTERVAL:Caitlin Lin is a 83 year old life long never smoker, with nodular clusters on the LEFT and RIGHT upper lobes.  She had a negative navigational bronchoscopy on 09 January 2019 where the right upper lobe lung nodule cluster was sampled by navigational bronchoscopy and showed mostly inflammatory changes. She was originally referred to us  by Dr. Evalene Glasser.  Her primary care physician is Dr. Shad Glendia.  Her last visit here was on 1 14 April 2022 at that time she was doing well from the respiratory standpoint.    HPI Discussed the use of AI scribe software for clinical note transcription with the patient, who gave verbal consent to proceed.  History of Present Illness   Caitlin Lin is an 83 year old female who presents for follow-up after resolution of a lung nodule as a granuloma. She was referred by Dr. Glasser in 2020 for evaluation of a lung nodule.  She has a history of a lung nodule that was previously evaluated and resolved as a granuloma. Biopsies taken at that time showed giant cell inflammation, suggesting response to an infection or irritant. The nodule has since resolved into a neuroma, and she is here for follow-up to ensure no further issues.  Last CT chest was 18 June 2022.  No other nodules identified.  Patient does not require further imaging.  She  recently had RSV, which she describes as 'horrible,' and compares it to her experience with COVID-19 two years ago, noting that her heart rate was worse during COVID-19. She continues to experience post-nasal drip and a mild cough, along with a sensation of her head feeling 'foolish.' She uses a nasal wash and sinus nasal spray a couple of times a day, which she finds helpful. No shortness of breath.  She has never smoked or used tobacco products.    Review of Systems A 10 point review of systems was performed and it is as noted above otherwise negative.   Patient Active Problem List   Diagnosis Date Noted   Cough 03/04/2023   Frequent UTI 10/05/2022   Hematuria 08/25/2022   Atrial fibrillation (HCC) 07/02/2022   Coronary artery disease by CT 06/19/2022   Atrial fibrillation with rapid ventricular response (HCC) 06/18/2022   Lung nodules 04/14/2022   Weakness of both legs 09/06/2021   Thrombocytopenia (HCC) 01/03/2021   Elevated alkaline phosphatase level 09/30/2020   Hyperkalemia 09/23/2020   Stress 05/18/2020   Fatigue 03/30/2020   Leg swelling 03/30/2020   Dysphagia 08/09/2019   Sleep difficulties 11/23/2018   Hyperglycemia 11/20/2017   Aortic atherosclerosis (HCC) 06/16/2017   Low back pain 06/06/2017   Abdominal pain 06/03/2017   Fatty liver 02/13/2017   Aortic regurgitation 07/01/2016   Health care maintenance 05/12/2016   Chest pain 05/12/2016   TMJ arthralgia 09/25/2014   GERD (gastroesophageal reflux disease) 12/05/2013   Osteopenia 10/30/2013   Hypercholesterolemia 03/04/2013   Medicare annual wellness  visit, subsequent 09/05/2012   Anemia, iron deficiency 06/08/2012   Screening for breast cancer 08/03/2011   Rectal prolapse 08/03/2011   Hypertension 02/09/2011   CKD (chronic kidney disease) stage 4, GFR 15-29 ml/min (HCC) 02/09/2011   Hypothyroidism 02/09/2011    Social History   Tobacco Use   Smoking status: Never    Passive exposure: Never   Smokeless  tobacco: Never  Substance Use Topics   Alcohol use: No    Allergies  Allergen Reactions   Quinapril  Other (See Comments)    Hyperkalemia   Codeine Rash    Current Meds  Medication Sig   Alpha-Lipoic Acid 200 MG TABS Take 200 mg by mouth 3 (three) times daily.   amLODipine  (NORVASC ) 5 MG tablet Take 1 tablet (5 mg total) by mouth daily.   aspirin  81 MG tablet Take 1 tablet (81 mg total) by mouth daily.   atorvastatin  (LIPITOR) 20 MG tablet Take 1 tablet (20 mg total) by mouth daily.   Cholecalciferol  25 MCG (1000 UT) tablet Take 1,000 Units by mouth daily.   Docusate Calcium  (STOOL SOFTENER PO) Take by mouth.   levothyroxine  (SYNTHROID ) 75 MCG tablet TAKE 1 TABLET BY MOUTH ONCE DAILY IN THE MORNING BEFORE BREAKFAST   Multiple Vitamin (MULTIVITAMIN) tablet Take 1 tablet by mouth daily.   Omega-3 Fatty Acids (FISH OIL ) 1000 MG CAPS Take 1,000 mg by mouth daily.   pantoprazole  (PROTONIX ) 40 MG tablet Take 1 tablet (40 mg total) by mouth daily. Take 30 minutes before breakfast   Polyethylene Glycol 3350 (MIRALAX PO) Take by mouth as needed.   trimethoprim  (TRIMPEX ) 100 MG tablet Take 1 tablet (100 mg total) by mouth daily.   Vibegron  (GEMTESA ) 75 MG TABS Take 1 tablet (75 mg total) by mouth daily.   Wheat Dextrin (BENEFIBER PO) Take by mouth.    Immunization History  Administered Date(s) Administered   Fluad Quad(high Dose 65+) 12/22/2019, 01/03/2021   Hepatitis A 07/14/2017, 08/17/2017   Hepatitis B 07/14/2017, 08/17/2017   Influenza Split 12/15/2010, 03/07/2012   Influenza, High Dose Seasonal PF 12/21/2016, 11/17/2017, 12/26/2018, 12/31/2021, 12/24/2022   Influenza,inj,Quad PF,6+ Mos 12/05/2013   Influenza-Unspecified 12/25/2014, 01/03/2016, 12/28/2016   PFIZER(Purple Top)SARS-COV-2 Vaccination 05/29/2019, 06/19/2019   Pneumococcal Conjugate-13 03/12/2014   Pneumococcal Polysaccharide-23 12/09/2005, 03/03/2013   Tdap 03/07/2012   Zoster, Live 12/09/2009        Objective:      BP 138/80 (BP Location: Right Arm, Cuff Size: Normal)   Pulse 75   Temp (!) 97.5 F (36.4 C)   Ht 5' 4 (1.626 m)   Wt 149 lb 3.2 oz (67.7 kg)   SpO2 97%   BMI 25.61 kg/m   SpO2: 97 % O2 Device: None (Room air)  GENERAL: Well-developed, well-nourished female, age-appropriate, no acute distress, fully ambulatory. No conversational dyspnea.  Mild bronzing of the skin. HEAD: Normocephalic, atraumatic. EYES: Pupils equal, round, reactive to light.  No scleral icterus. MOUTH: Nose/mouth/throat not examined due to masking requirements for COVID 19.SABRA NECK: Supple. No thyromegaly. Trachea midline. No JVD.  No adenopathy. PULMONARY: Good air entry bilaterally.  No adventitious sounds. CARDIOVASCULAR: S1 and S2. Regular rate and rhythm. No rubs, murmurs or gallops heard. ABDOMEN: Benign. MUSCULOSKELETAL: No joint deformity, no clubbing, no edema. NEUROLOGIC: No focal deficits, no gait disturbance, fluent speech. SKIN: Intact,warm,dry. On limited exam, no rashes. PSYCH: Mood and behavior normal.    Assessment & Plan:     ICD-10-CM   1. Lung granuloma (HCC)  J84.10  Discussion:    RSV Infection Recent RSV infection with residual post-nasal drip and cough. No dyspnea or other complications. Symptoms improving with current treatment. Chest x-ray in December showed no pneumonia. - Continue nasal saline wash and sinus nasal spray as directed - Monitor symptoms and return if persistent or worsening  Resolved Lung Nodule (Granuloma) Previously identified lung nodule resolved as granuloma. Follow-up chest x-ray in December showed no pneumonia, only a small scar remains. No current respiratory symptoms. Previous biopsies and cultures indicated giant cell inflammation. - No further action unless new respiratory symptoms develop - Return for evaluation if new or persistent respiratory issues arise  General Health Maintenance No tobacco use. Lungs clear on examination. Patient  aware of need to return if new symptoms develop. - Provide instructions for follow-up care - Encourage use of MyChart for future communication and health management.   Will see the patient in follow-up on an as-needed basis.  Advised if symptoms do not improve or worsen, to please contact office for sooner follow up or seek emergency care.    I spent 20 minutes of dedicated to the care of this patient on the date of this encounter to include pre-visit review of records, face-to-face time with the patient discussing conditions above, post visit ordering of testing, clinical documentation with the electronic health record, making appropriate referrals as documented, and communicating necessary findings to members of the patients care team.     C. Leita Sanders, MD Advanced Bronchoscopy PCC[facility]    *This note was generated using voice recognition software/Dragon and/or AI transcription program.  Despite best efforts to proofread, errors can occur which can change the meaning. Any transcriptional errors that result from this process are unintentional and may not be fully corrected at the time of dictation.

## 2023-05-03 ENCOUNTER — Encounter: Payer: HMO | Admitting: Internal Medicine

## 2023-05-11 ENCOUNTER — Ambulatory Visit (INDEPENDENT_AMBULATORY_CARE_PROVIDER_SITE_OTHER): Payer: HMO | Admitting: Internal Medicine

## 2023-05-11 ENCOUNTER — Encounter: Payer: Self-pay | Admitting: Internal Medicine

## 2023-05-11 ENCOUNTER — Telehealth: Payer: Self-pay | Admitting: Internal Medicine

## 2023-05-11 VITALS — BP 120/72 | HR 65 | Temp 98.0°F | Resp 16 | Ht 64.0 in | Wt 148.6 lb

## 2023-05-11 DIAGNOSIS — I700 Atherosclerosis of aorta: Secondary | ICD-10-CM

## 2023-05-11 DIAGNOSIS — N184 Chronic kidney disease, stage 4 (severe): Secondary | ICD-10-CM | POA: Diagnosis not present

## 2023-05-11 DIAGNOSIS — F439 Reaction to severe stress, unspecified: Secondary | ICD-10-CM

## 2023-05-11 DIAGNOSIS — I4891 Unspecified atrial fibrillation: Secondary | ICD-10-CM | POA: Diagnosis not present

## 2023-05-11 DIAGNOSIS — N390 Urinary tract infection, site not specified: Secondary | ICD-10-CM

## 2023-05-11 DIAGNOSIS — G479 Sleep disorder, unspecified: Secondary | ICD-10-CM | POA: Diagnosis not present

## 2023-05-11 DIAGNOSIS — D696 Thrombocytopenia, unspecified: Secondary | ICD-10-CM | POA: Diagnosis not present

## 2023-05-11 DIAGNOSIS — R002 Palpitations: Secondary | ICD-10-CM | POA: Insufficient documentation

## 2023-05-11 DIAGNOSIS — R918 Other nonspecific abnormal finding of lung field: Secondary | ICD-10-CM

## 2023-05-11 DIAGNOSIS — I10 Essential (primary) hypertension: Secondary | ICD-10-CM | POA: Diagnosis not present

## 2023-05-11 DIAGNOSIS — E7800 Pure hypercholesterolemia, unspecified: Secondary | ICD-10-CM

## 2023-05-11 DIAGNOSIS — K219 Gastro-esophageal reflux disease without esophagitis: Secondary | ICD-10-CM

## 2023-05-11 DIAGNOSIS — R739 Hyperglycemia, unspecified: Secondary | ICD-10-CM

## 2023-05-11 DIAGNOSIS — E039 Hypothyroidism, unspecified: Secondary | ICD-10-CM | POA: Diagnosis not present

## 2023-05-11 DIAGNOSIS — Z0000 Encounter for general adult medical examination without abnormal findings: Secondary | ICD-10-CM

## 2023-05-11 MED ORDER — GABAPENTIN 100 MG PO CAPS: 100.0000 mg | ORAL_CAPSULE | Freq: Every day | ORAL | 2 refills | Status: DC

## 2023-05-11 NOTE — Assessment & Plan Note (Signed)
 Low-carb diet and exercise.  Follow met b and A1c.

## 2023-05-11 NOTE — Assessment & Plan Note (Signed)
 Was on melatonin. Off now. Not working. Sleep issues as outlined. Given also reporting symptoms that appear to be c/w neuropathy, trial of neurontin - 100mg  q hs. Discussed possible side effects of medication. Follow.

## 2023-05-11 NOTE — Assessment & Plan Note (Signed)
 Follow cbc.

## 2023-05-11 NOTE — Assessment & Plan Note (Signed)
 Continues on amlodipine. Blood pressure as outlined. Continue to follow pressure. Follow metabolic panel.

## 2023-05-11 NOTE — Assessment & Plan Note (Signed)
 Physical today 05/11/23.  Mammogram 11/07/21 - Birads 0.  Recommended f/u right breast mammogram.  F/u right breast mammogram - birads I.  F/u mammogram 11/10/22 - Birads I. Had colonoscopy 10/29/21 - internal hemorrhoids that prolapse with straining, but require manual replacement into the anal canal (Grade III) found on perianal exam. Non-bleeding internal hemorrhoids. Moderate diverticulosis in the sigmoid colon.

## 2023-05-11 NOTE — Telephone Encounter (Signed)
 Has seen cardiology - [facility] - needs f/u appt - persistent intermittent fluttering.

## 2023-05-11 NOTE — Assessment & Plan Note (Signed)
 EGD - 08/09/20 - mild reactive gastropathy, fundic gland polyps, mucosa with edema and congestion. Persistent abdominal discomfort as outlined previous note.  Saw GI 12/2022. Recommended protonix bid.  Recommended EGD. Was scheduled. Had to be canceled due to RSV. Request referral back to f/u regarding EGD.

## 2023-05-11 NOTE — Progress Notes (Signed)
 Subjective:    Patient ID: Caitlin Lin, female    DOB: Oct 04, 1940, 83 y.o.   MRN: [id number]  Patient here for  Chief Complaint  Patient presents with   Annual Exam    HPI Here for a physical exam. Had f/u with pulmonary 04/23/23. Resolved lung nodule. No further w/up. Recent RSV infection. Symptoms have improved. Some occasional cough and drainage, but no significant problems. Has felt much better the last two weeks. Using saline nasal spray. Has noticed some fluttering/palpitations. Notices intermittent episodes. No dizziness or light headedness. No sob. No chest pain. Does report when lying down and if she puts her hands on her chest, will notice chest pressure. Does not notice chest pressure when lying down if hands are not on her chest. No chest pain with ambulation. Is having some persistent heart burn. Also persistent full feeling when eats. Was scheduled for EGD, but had to cancel when had RSV. Was told needed a new referral. Had f/u with urology 03/29/23 - gemtesa samples. Reports problems with sleep. Also reports burning sensation - feet/toes. Feels needs something to help with sleep/neuropathy.    Past Medical History:  Diagnosis Date   Cataracts, bilateral    Chronic kidney disease    Followed by Dr. Cherylann Ratel   Chronic kidney disease    Colon polyp    Constipation due to slow transit    Diarrhea    Diarrhea    GERD (gastroesophageal reflux disease)    Heart murmur    History of cyst of breast    History of hiatal hernia    Hyperlipidemia    Hypertension    Hypothyroidism    Melanoma of lower leg (HCC) 2013   Shingles 2013   Shingles    Spinal stenosis in cervical region    Dr. Yves Dill   Subdural hematoma [facility])    Thyroid disease    Past Surgical History:  Procedure Laterality Date   ABDOMINAL HYSTERECTOMY  1974   ANTERIOR AND POSTERIOR VAGINAL REPAIR     ANTERIOR AND POSTERIOR VAGINAL REPAIR W/ SACROSPINOUS LIGAMENT SUSPENSION Right    BREAST BIOPSY  1963    Benign per pt's report   BREAST EXCISIONAL BIOPSY Right 1963   neg   COLONOSCOPY     COLONOSCOPY N/A 10/29/2021   Procedure: COLONOSCOPY;  Surgeon: Toledo, Boykin Nearing, MD;  Location: [facility] ENDOSCOPY;  Service: Gastroenterology;  Laterality: N/A;   ELECTROMAGNETIC NAVIGATION BROCHOSCOPY Right 01/09/2019   Procedure: ELECTROMAGNETIC NAVIGATION BRONCHOSCOPY;  Surgeon: Salena Saner, MD;  Location: [facility] ORS;  Service: Cardiopulmonary;  Laterality: Right;   ESOPHAGOGASTRODUODENOSCOPY     ESOPHAGOGASTRODUODENOSCOPY (EGD) WITH PROPOFOL N/A 11/10/2016   Procedure: ESOPHAGOGASTRODUODENOSCOPY (EGD) WITH PROPOFOL;  Surgeon: Christena Deem, MD;  Location: [facility] ENDOSCOPY;  Service: Endoscopy;  Laterality: N/A;   ESOPHAGOGASTRODUODENOSCOPY (EGD) WITH PROPOFOL N/A 09/09/2018   Procedure: ESOPHAGOGASTRODUODENOSCOPY (EGD) WITH PROPOFOL;  Surgeon: Christena Deem, MD;  Location: [facility] ENDOSCOPY;  Service: Endoscopy;  Laterality: N/A;   ESOPHAGOGASTRODUODENOSCOPY (EGD) WITH PROPOFOL N/A 10/27/2019   Procedure: ESOPHAGOGASTRODUODENOSCOPY (EGD) WITH PROPOFOL;  Surgeon: Earline Mayotte, MD;  Location: [facility] ENDOSCOPY;  Service: Endoscopy;  Laterality: N/A;   ESOPHAGOGASTRODUODENOSCOPY (EGD) WITH PROPOFOL N/A 08/09/2020   Procedure: ESOPHAGOGASTRODUODENOSCOPY (EGD) WITH PROPOFOL;  Surgeon: Regis Bill, MD;  Location: [facility] ENDOSCOPY;  Service: Endoscopy;  Laterality: N/A;   LUNG BIOPSY  2007   Benign per pt's report   LUNG BIOPSY Right    OOPHORECTOMY  1970   PERINEOPLASTY  12/17/2013  sling for stress incontinence  12/18/2011   Family History  Problem Relation Age of Onset   Arthritis Mother    Liver disease Mother    Cirrhosis Mother    Liver cancer Mother    Heart disease Father    Heart attack Father    Coronary artery disease Father    Depression Brother    Prostate cancer Brother    Diabetes Maternal Aunt    Breast cancer Cousin        maternal cousins   Liver cancer Cousin     Breast cancer Other    Social History   Socioeconomic History   Marital status: Married    Spouse name: Not on file   Number of children: 2   Years of education: Not on file   Highest education level: Not on file  Occupational History   Occupation: Retired  Tobacco Use   Smoking status: Never    Passive exposure: Never   Smokeless tobacco: Never  Vaping Use   Vaping status: Never Used  Substance and Sexual Activity   Alcohol use: No   Drug use: No   Sexual activity: Yes  Other Topics Concern   Not on file  Social History Narrative   Lives in [place] with husband. Has 2 children boy and girl, 4 grandchildren. Retired Insurance underwriter.       Daily Caffeine Use:  NO   Regular Exercise -  Not at this time due to back pain, would like to resume soon   Social Drivers of Corporate investment banker Strain: Low Risk  (12/01/2022)   Overall Financial Resource Strain (CARDIA)    Difficulty of Paying Living Expenses: Not hard at all  Food Insecurity: No Food Insecurity (12/01/2022)   Hunger Vital Sign    Worried About Running Out of Food in the Last Year: Never true    Ran Out of Food in the Last Year: Never true  Transportation Needs: No Transportation Needs (12/01/2022)   PRAPARE - Administrator, Civil Service (Medical): No    Lack of Transportation (Non-Medical): No  Physical Activity: Sufficiently Active (12/01/2022)   Exercise Vital Sign    Days of Exercise per Week: 5 days    Minutes of Exercise per Session: 40 min  Stress: No Stress Concern Present (12/01/2022)   [employer] of Occupational Health - Occupational Stress Questionnaire    Feeling of Stress : Only a little  Social Connections: Socially Integrated (12/01/2022)   Social Connection and Isolation Panel [NHANES]    Frequency of Communication with Friends and Family: More than three times a week    Frequency of Social Gatherings with Friends and Family: Three times a week    Attends Religious  Services: More than 4 times per year    Active Member of Clubs or Organizations: Yes    Attends Banker Meetings: More than 4 times per year    Marital Status: Married     Review of Systems  Constitutional:  Negative for appetite change and unexpected weight change.  HENT:  Negative for sinus pressure and sore throat.        Minimal congestion.   Eyes:  Negative for pain and visual disturbance.  Respiratory:  Negative for chest tightness and shortness of breath.        Minimal intermittent cough.   Cardiovascular:  Positive for palpitations. Negative for chest pain and leg swelling.  Gastrointestinal:  Negative for abdominal pain, diarrhea, nausea  and vomiting.       Acid reflux and abdominal fullness as outlined.   Genitourinary:  Negative for difficulty urinating and dysuria.  Musculoskeletal:  Negative for joint swelling and myalgias.  Skin:  Negative for color change and rash.  Neurological:  Negative for dizziness and headaches.  Hematological:  Negative for adenopathy. Does not bruise/bleed easily.  Psychiatric/Behavioral:  Negative for agitation and dysphoric mood.        Objective:     BP 120/72   Pulse 65   Temp 98 F (36.7 C)   Resp 16   Ht 5\' 4"  (1.626 m)   Wt 148 lb 9.6 oz (67.4 kg)   SpO2 98%   BMI 25.51 kg/m  Wt Readings from Last 3 Encounters:  05/11/23 148 lb 9.6 oz (67.4 kg)  04/23/23 149 lb 3.2 oz (67.7 kg)  03/29/23 146 lb (66.2 kg)    Physical Exam Vitals reviewed.  Constitutional:      General: She is not in acute distress.    Appearance: Normal appearance. She is well-developed.  HENT:     Head: Normocephalic and atraumatic.     Right Ear: External ear normal.     Left Ear: External ear normal.     Mouth/Throat:     Pharynx: No oropharyngeal exudate or posterior oropharyngeal erythema.  Eyes:     General: No scleral icterus.       Right eye: No discharge.        Left eye: No discharge.     Conjunctiva/sclera: Conjunctivae  normal.  Neck:     Thyroid: No thyromegaly.  Cardiovascular:     Rate and Rhythm: Normal rate and regular rhythm.  Pulmonary:     Effort: No tachypnea, accessory muscle usage or respiratory distress.     Breath sounds: Normal breath sounds. No decreased breath sounds or wheezing.  Chest:  Breasts:    Right: No inverted nipple, mass, nipple discharge or tenderness (no axillary adenopathy).     Left: No inverted nipple, mass, nipple discharge or tenderness (no axilarry adenopathy).  Abdominal:     General: Bowel sounds are normal.     Palpations: Abdomen is soft.     Tenderness: There is no abdominal tenderness.  Musculoskeletal:        General: No swelling or tenderness.     Cervical back: Neck supple.  Lymphadenopathy:     Cervical: No cervical adenopathy.  Skin:    Findings: No erythema or rash.  Neurological:     Mental Status: She is alert and oriented to person, place, and time.  Psychiatric:        Mood and Affect: Mood normal.        Behavior: Behavior normal.         Outpatient Encounter Medications as of 05/11/2023  Medication Sig   gabapentin (NEURONTIN) 100 MG capsule Take 1 capsule (100 mg total) by mouth at bedtime.   Alpha-Lipoic Acid 200 MG TABS Take 200 mg by mouth 3 (three) times daily.   amLODipine (NORVASC) 5 MG tablet Take 1 tablet (5 mg total) by mouth daily.   aspirin 81 MG tablet Take 1 tablet (81 mg total) by mouth daily.   atorvastatin (LIPITOR) 20 MG tablet Take 1 tablet (20 mg total) by mouth daily.   Cholecalciferol 25 MCG (1000 UT) tablet Take 1,000 Units by mouth daily.   Docusate Calcium (STOOL SOFTENER PO) Take by mouth.   levothyroxine (SYNTHROID) 75 MCG tablet TAKE 1 TABLET BY  MOUTH ONCE DAILY IN THE MORNING BEFORE BREAKFAST   Multiple Vitamin (MULTIVITAMIN) tablet Take 1 tablet by mouth daily.   Omega-3 Fatty Acids (FISH OIL) 1000 MG CAPS Take 1,000 mg by mouth daily.   pantoprazole (PROTONIX) 40 MG tablet Take 1 tablet (40 mg total) by  mouth daily. Take 30 minutes before breakfast   Polyethylene Glycol 3350 (MIRALAX PO) Take by mouth as needed.   trimethoprim (TRIMPEX) 100 MG tablet Take 1 tablet (100 mg total) by mouth daily.   Vibegron (GEMTESA) 75 MG TABS Take 1 tablet (75 mg total) by mouth daily.   Wheat Dextrin (BENEFIBER PO) Take by mouth.   No facility-administered encounter medications on file as of 05/11/2023.     Lab Results  Component Value Date   WBC 10.1 03/04/2023   HGB 12.7 03/04/2023   HCT 38.3 03/04/2023   PLT 182.0 03/04/2023   GLUCOSE 88 03/04/2023   CHOL 172 03/04/2023   TRIG 228.0 (H) 03/04/2023   HDL 49.50 03/04/2023   LDLDIRECT 80.0 01/05/2022   LDLCALC 76 03/04/2023   ALT 23 03/04/2023   AST 26 03/04/2023   NA 135 03/04/2023   K 4.4 03/04/2023   CL 98 03/04/2023   CREATININE 1.85 (H) 03/04/2023   BUN 34 (H) 03/04/2023   CO2 26 03/04/2023   TSH 0.88 10/08/2022   INR 1.1 06/18/2022   HGBA1C 6.1 03/04/2023   MICROALBUR 2.1 (H) 03/27/2015    DG Chest 2 View Result Date: 03/04/2023 CLINICAL DATA:  Cough.  Congestion.  Wheezing. EXAM: CHEST - 2 VIEW COMPARISON:  06/18/2022 FINDINGS: Hyperinflation. Midline trachea. Normal heart size. Atherosclerosis in the transverse aorta. No pleural effusion or pneumothorax. Right upper lobe calcified granuloma was detailed on prior CT. Lungs are otherwise clear. S shaped thoracolumbar spine curvature. IMPRESSION: Hyperinflation, without acute disease. Aortic Atherosclerosis (ICD10-I70.0). Electronically Signed   By: Jeronimo Greaves M.D.   On: 03/04/2023 11:28       Assessment & Plan:  Health care maintenance Assessment & Plan: Physical today 05/11/23.  Mammogram 11/07/21 - Birads 0.  Recommended f/u right breast mammogram.  F/u right breast mammogram - birads I.  F/u mammogram 11/10/22 - Birads I. Had colonoscopy 10/29/21 - internal hemorrhoids that prolapse with straining, but require manual replacement into the anal canal (Grade III) found on perianal  exam. Non-bleeding internal hemorrhoids. Moderate diverticulosis in the sigmoid colon.    Hypercholesterolemia Assessment & Plan: Continue lipitor. Low cholesterol diet and exercise. Follow lipid panel and liver function tests.   Orders: -     Lipid panel; Future -     Hepatic function panel; Future  Primary hypertension Assessment & Plan: Continues on amlodipine. Blood pressure as outlined. Continue to follow pressure. Follow metabolic panel.   Orders: -     Basic metabolic panel; Future  Hyperglycemia Assessment & Plan: Low carb diet and exercise.  Follow met b and A1c.   Orders: -     Hemoglobin A1c; Future  Palpitations Assessment & Plan: Reports noticing occasional fluttering/palpitations as outlined. May occur 2x/week. Some chest pressure as outlined. The pressure occurs when lying down and when places her hands on her chest. No chest pressure with activity. No known triggers for fluttering. In reviewing, previous monitor - no afib. ECHO - mild to moderate AR, mild MR and EF 60%. Discussed f/u with cardiology given persistent intermittent symptoms as outlined.   Orders: -     EKG 12-Lead  Thrombocytopenia (HCC) Assessment & Plan: Follow cbc.  Stress Assessment & Plan: Continue zoloft. Overall appears to be doing well. Follow.    Sleep difficulties Assessment & Plan: Was on melatonin. Off now. Not working. Sleep issues as outlined. Given also reporting symptoms that appear to be c/w neuropathy, trial of neurontin - 100mg  q hs. Discussed possible side effects of medication. Follow.    Lung nodules Assessment & Plan: Had f/u with pulmoanry 04/23/23 - resolved lung nodule. No further w/up warranted.    Hypothyroidism, unspecified type Assessment & Plan: On thyroid replacement.  Follow tsh.    Gastroesophageal reflux disease without esophagitis Assessment & Plan: EGD - 08/09/20 - mild reactive gastropathy, fundic gland polyps, mucosa with edema and  congestion. Persistent abdominal discomfort as outlined previous note.  Saw GI 12/2022. Recommended protonix bid.  Recommended EGD. Was scheduled. Had to be canceled due to RSV. Request referral back to f/u regarding EGD.   Orders: -     Ambulatory referral to Gastroenterology  Frequent UTI Assessment & Plan: Followed by urology. On trimethoprim.    CKD (chronic kidney disease) stage 4, GFR 15-29 ml/min (HCC) Assessment & Plan: On amlodipine. Followed by nephrology. Avoid antiinflammatory medications.    Atrial fibrillation, unspecified type [facility]) Assessment & Plan: Was found to have new onset afib with RVR (up to 186) - during recent hospitalization. Was given IV cardizem - followed by oral cardizem.  CXR negative.  Dr Kirke Corin consulted. Found to have UTI - E.coli pan sensitive.  IV abx and discharged on cipro for 10 days total abx.  Not started on anticoagulants.  Was felt afib - related to acute illness.  Zio monitor placed.  Discharged on metoprolol. Had f/u with cardiology 08/21/22. No further arrhythmias noted. Previous myoview - no ischemia.  Echo with normal EF.  Recommended continuing aspirin and statin. Hold anticoagulation - lone afib in the setting of UTI sepsis.  Overall doing well. Appears to be in SR today.  Has noticed intermittent palpations as outlined. EKG - SR with no acute ischemic changes. Given persistent symptoms as outlined, discussed f/u with cardiology.    Aortic atherosclerosis (HCC) Assessment & Plan: Continue lipitor.    Routine general medical examination at a health care facility  Other orders -     Gabapentin; Take 1 capsule (100 mg total) by mouth at bedtime.  Dispense: 30 capsule; Refill: 2     Dale Horicon, MD

## 2023-05-11 NOTE — Assessment & Plan Note (Signed)
 On amlodipine. Followed by nephrology. Avoid antiinflammatory medications.

## 2023-05-11 NOTE — Assessment & Plan Note (Signed)
 Was found to have new onset afib with RVR (up to 186) - during recent hospitalization. Was given IV cardizem - followed by oral cardizem.  CXR negative.  Dr Kirke Corin consulted. Found to have UTI - E.coli pan sensitive.  IV abx and discharged on cipro for 10 days total abx.  Not started on anticoagulants.  Was felt afib - related to acute illness.  Zio monitor placed.  Discharged on metoprolol. Had f/u with cardiology 08/21/22. No further arrhythmias noted. Previous myoview - no ischemia.  Echo with normal EF.  Recommended continuing aspirin and statin. Hold anticoagulation - lone afib in the setting of UTI sepsis.  Overall doing well. Appears to be in SR today.  Has noticed intermittent palpations as outlined. EKG - SR with no acute ischemic changes. Given persistent symptoms as outlined, discussed f/u with cardiology.

## 2023-05-11 NOTE — Assessment & Plan Note (Signed)
 Continue lipitor.  Low cholesterol diet and exercise.  Follow lipid panel and liver function tests.

## 2023-05-11 NOTE — Patient Instructions (Signed)
 Start pepcid (famotidine) 20mg  - take 30 minutes before evening meal.  Continue protonix in the morning.

## 2023-05-11 NOTE — Assessment & Plan Note (Signed)
 On thyroid replacement.  Follow tsh.

## 2023-05-11 NOTE — Assessment & Plan Note (Signed)
 Reports noticing occasional fluttering/palpitations as outlined. May occur 2x/week. Some chest pressure as outlined. The pressure occurs when lying down and when places her hands on her chest. No chest pressure with activity. No known triggers for fluttering. In reviewing, previous monitor - no afib. ECHO - mild to moderate AR, mild MR and EF 60%. Discussed f/u with cardiology given persistent intermittent symptoms as outlined.

## 2023-05-11 NOTE — Assessment & Plan Note (Signed)
 Continue zoloft. Overall appears to be doing well. Follow.

## 2023-05-11 NOTE — Assessment & Plan Note (Signed)
 Had f/u with pulmoanry 04/23/23 - resolved lung nodule. No further w/up warranted.

## 2023-05-11 NOTE — Assessment & Plan Note (Signed)
 Followed by urology. On trimethoprim.

## 2023-05-11 NOTE — Assessment & Plan Note (Signed)
 Continue lipitor  ?

## 2023-05-19 NOTE — Telephone Encounter (Signed)
Has appt 3/18

## 2023-05-24 ENCOUNTER — Encounter: Payer: Self-pay | Admitting: Urology

## 2023-05-24 ENCOUNTER — Ambulatory Visit (INDEPENDENT_AMBULATORY_CARE_PROVIDER_SITE_OTHER): Payer: HMO | Admitting: Urology

## 2023-05-24 VITALS — BP 158/74 | HR 76 | Ht 64.0 in | Wt 149.6 lb

## 2023-05-24 DIAGNOSIS — N3946 Mixed incontinence: Secondary | ICD-10-CM

## 2023-05-24 LAB — URINALYSIS, COMPLETE
Bilirubin, UA: NEGATIVE
Glucose, UA: NEGATIVE
Ketones, UA: NEGATIVE
Nitrite, UA: NEGATIVE
Protein,UA: NEGATIVE
Specific Gravity, UA: 1.01 (ref 1.005–1.030)
Urobilinogen, Ur: 0.2 mg/dL (ref 0.2–1.0)
pH, UA: 5.5 (ref 5.0–7.5)

## 2023-05-24 LAB — MICROSCOPIC EXAMINATION: WBC, UA: >30 /HPF — AB (ref 0–5)

## 2023-05-24 MED ORDER — MIRABEGRON ER 50 MG PO TB24: 50.0000 mg | ORAL_TABLET | Freq: Every day | ORAL | 11 refills | Status: DC

## 2023-05-24 NOTE — Progress Notes (Signed)
 05/24/2023 8:32 AM   Caitlin Lin 10-14-1940 [id number]  Referring provider: Dale Dasher, MD [address] [place],  [place] [address]  No chief complaint on file.   HPI: Reviewed lengthy note.  Culture from September  negative.  I put her back on trimethoprim since I did not think was related to the elevated potassium.  At baseline she was wearing 4-5 pads a day that were damp but she is quite fastidious with mixed dressers incontinence.  She sometimes leaks with urgency and sometimes leaks with coughing sneezing.  She has had some fecal incontinence not associated with awareness she has a small grade 3 cystocele hinging over the bladder neck.  She had diffuse cystitis cystica and cloudy urine on cystoscopy.   Patient still has a mixed incontinence but clinically is not infected.  She is pleased.  Frequency stable.  Incontinence affecting her quality of life.  Reassess in 6 weeks on Gemtesa samples and prescription and call if culture positive. Stay on trimethoprim   Today Frequency stable.  Last culture negative Patient did not improve on Gemtesa.  She thought she had a little bit of a strong smelling urine and we will send for culture today.  In terms of infections.  3 pads a day with a little bit of down-regulation on urge incontinence.  Fecal incontinence a little bit better   PMH: Past Medical History:  Diagnosis Date   Cataracts, bilateral    Chronic kidney disease    Followed by Dr. Cherylann Ratel   Chronic kidney disease    Colon polyp    Constipation due to slow transit    Diarrhea    Diarrhea    GERD (gastroesophageal reflux disease)    Heart murmur    History of cyst of breast    History of hiatal hernia    Hyperlipidemia    Hypertension    Hypothyroidism    Melanoma of lower leg (HCC) 2013   Shingles 2013   Shingles    Spinal stenosis in cervical region    Dr. Yves Dill   Subdural hematoma [facility])    Thyroid disease     Surgical  History: Past Surgical History:  Procedure Laterality Date   ABDOMINAL HYSTERECTOMY  1974   ANTERIOR AND POSTERIOR VAGINAL REPAIR     ANTERIOR AND POSTERIOR VAGINAL REPAIR W/ SACROSPINOUS LIGAMENT SUSPENSION Right    BREAST BIOPSY  1963   Benign per pt's report   BREAST EXCISIONAL BIOPSY Right 1963   neg   COLONOSCOPY     COLONOSCOPY N/A 10/29/2021   Procedure: COLONOSCOPY;  Surgeon: Toledo, Boykin Nearing, MD;  Location: [facility] ENDOSCOPY;  Service: Gastroenterology;  Laterality: N/A;   ELECTROMAGNETIC NAVIGATION BROCHOSCOPY Right 01/09/2019   Procedure: ELECTROMAGNETIC NAVIGATION BRONCHOSCOPY;  Surgeon: Salena Saner, MD;  Location: [facility] ORS;  Service: Cardiopulmonary;  Laterality: Right;   ESOPHAGOGASTRODUODENOSCOPY     ESOPHAGOGASTRODUODENOSCOPY (EGD) WITH PROPOFOL N/A 11/10/2016   Procedure: ESOPHAGOGASTRODUODENOSCOPY (EGD) WITH PROPOFOL;  Surgeon: Christena Deem, MD;  Location: [facility] ENDOSCOPY;  Service: Endoscopy;  Laterality: N/A;   ESOPHAGOGASTRODUODENOSCOPY (EGD) WITH PROPOFOL N/A 09/09/2018   Procedure: ESOPHAGOGASTRODUODENOSCOPY (EGD) WITH PROPOFOL;  Surgeon: Christena Deem, MD;  Location: [facility] ENDOSCOPY;  Service: Endoscopy;  Laterality: N/A;   ESOPHAGOGASTRODUODENOSCOPY (EGD) WITH PROPOFOL N/A 10/27/2019   Procedure: ESOPHAGOGASTRODUODENOSCOPY (EGD) WITH PROPOFOL;  Surgeon: Earline Mayotte, MD;  Location: [facility] ENDOSCOPY;  Service: Endoscopy;  Laterality: N/A;   ESOPHAGOGASTRODUODENOSCOPY (EGD) WITH PROPOFOL N/A 08/09/2020   Procedure: ESOPHAGOGASTRODUODENOSCOPY (EGD)  WITH PROPOFOL;  Surgeon: Regis Bill, MD;  Location: [facility] ENDOSCOPY;  Service: Endoscopy;  Laterality: N/A;   LUNG BIOPSY  2007   Benign per pt's report   LUNG BIOPSY Right    OOPHORECTOMY  1970   PERINEOPLASTY  12/17/2013   sling for stress incontinence  12/18/2011    Home Medications:  Allergies as of 05/24/2023       Reactions   Quinapril Other (See Comments)   Hyperkalemia   Codeine  Rash        Medication List        Accurate as of May 24, 2023  8:32 AM. If you have any questions, ask your nurse or doctor.          Alpha-Lipoic Acid 200 MG Tabs Take 200 mg by mouth 3 (three) times daily.   amLODipine 5 MG tablet Commonly known as: NORVASC Take 1 tablet (5 mg total) by mouth daily.   aspirin 81 MG tablet Take 1 tablet (81 mg total) by mouth daily.   atorvastatin 20 MG tablet Commonly known as: LIPITOR Take 1 tablet (20 mg total) by mouth daily.   BENEFIBER PO Take by mouth.   Cholecalciferol 25 MCG (1000 UT) tablet Take 1,000 Units by mouth daily.   Fish Oil 1000 MG Caps Take 1,000 mg by mouth daily.   gabapentin 100 MG capsule Commonly known as: NEURONTIN Take 1 capsule (100 mg total) by mouth at bedtime.   Gemtesa 75 MG Tabs Generic drug: Vibegron Take 1 tablet (75 mg total) by mouth daily.   levothyroxine 75 MCG tablet Commonly known as: SYNTHROID TAKE 1 TABLET BY MOUTH ONCE DAILY IN THE MORNING BEFORE BREAKFAST   MIRALAX PO Take by mouth as needed.   multivitamin tablet Take 1 tablet by mouth daily.   pantoprazole 40 MG tablet Commonly known as: PROTONIX Take 1 tablet (40 mg total) by mouth daily. Take 30 minutes before breakfast   STOOL SOFTENER PO Take by mouth.   trimethoprim 100 MG tablet Commonly known as: TRIMPEX Take 1 tablet (100 mg total) by mouth daily.        Allergies:  Allergies  Allergen Reactions   Quinapril Other (See Comments)    Hyperkalemia   Codeine Rash    Family History: Family History  Problem Relation Age of Onset   Arthritis Mother    Liver disease Mother    Cirrhosis Mother    Liver cancer Mother    Heart disease Father    Heart attack Father    Coronary artery disease Father    Depression Brother    Prostate cancer Brother    Diabetes Maternal Aunt    Breast cancer Cousin        maternal cousins   Liver cancer Cousin    Breast cancer Other     Social History:   reports that she has never smoked. She has never been exposed to tobacco smoke. She has never used smokeless tobacco. She reports that she does not drink alcohol and does not use drugs.  ROS:                                        Physical Exam: There were no vitals taken for this visit.  Constitutional:  Alert and oriented, No acute distress. HEENT: [place] AT, moist mucus membranes.  Trachea midline, no masses.  Laboratory Data: Lab Results  Component Value Date   WBC 10.1 03/04/2023   HGB 12.7 03/04/2023   HCT 38.3 03/04/2023   MCV 91.1 03/04/2023   PLT 182.0 03/04/2023    Lab Results  Component Value Date   CREATININE 1.85 (H) 03/04/2023    No results found for: "PSA"  No results found for: "TESTOSTERONE"  Lab Results  Component Value Date   HGBA1C 6.1 03/04/2023    Urinalysis    Component Value Date/Time   COLORURINE COLORLESS (A) 11/27/2022 1302   APPEARANCEUR Cloudy (A) 03/29/2023 0932   LABSPEC 1.002 (L) 11/27/2022 1302   PHURINE 7.0 11/27/2022 1302   GLUCOSEU Negative 03/29/2023 0932   GLUCOSEU NEGATIVE 07/22/2018 0839   HGBUR NEGATIVE 11/27/2022 1302   BILIRUBINUR Negative 03/29/2023 0932   KETONESUR NEGATIVE 11/27/2022 1302   PROTEINUR Negative 03/29/2023 0932   PROTEINUR NEGATIVE 11/27/2022 1302   UROBILINOGEN 0.2 07/22/2018 0839   NITRITE Negative 03/29/2023 0932   NITRITE NEGATIVE 11/27/2022 1302   LEUKOCYTESUR 2+ (A) 03/29/2023 0932   LEUKOCYTESUR NEGATIVE 11/27/2022 1302    Pertinent Imaging: Urine reviewed and sent for culture reassess in 6 weeks on Myrbetriq 50 mg 30 x 11.  Then we can try antimuscarinics.  I have not brought up the role of urodynamics at this stage but will eventually.  May be a good candidate for InterStim for bowel and bladder  Assessment & Plan:  reassess in 6 weeks on Myrbetriq 50 mg 30 x 11.  Then we can try antimuscarinics.  I have not brought up the role of urodynamics at this stage but will  eventually.  May be a good candidate for InterStim for bowel and bladder  1. Mixed incontinence (Primary)  - Urinalysis, Complete   No follow-ups on file.  Martina Sinner, MD  [facility] [address], [address] [place], [place] [address] [phone]

## 2023-05-27 LAB — CULTURE, URINE COMPREHENSIVE

## 2023-05-28 ENCOUNTER — Other Ambulatory Visit: Payer: Self-pay

## 2023-05-28 MED ORDER — NITROFURANTOIN MACROCRYSTAL 100 MG PO CAPS: 100.0000 mg | ORAL_CAPSULE | Freq: Two times a day (BID) | ORAL | 0 refills | Status: AC

## 2023-06-01 ENCOUNTER — Ambulatory Visit: Payer: HMO | Admitting: Cardiology

## 2023-06-14 DIAGNOSIS — I10 Essential (primary) hypertension: Secondary | ICD-10-CM | POA: Diagnosis not present

## 2023-06-14 DIAGNOSIS — N184 Chronic kidney disease, stage 4 (severe): Secondary | ICD-10-CM | POA: Diagnosis not present

## 2023-07-08 ENCOUNTER — Other Ambulatory Visit (INDEPENDENT_AMBULATORY_CARE_PROVIDER_SITE_OTHER): Payer: HMO

## 2023-07-08 DIAGNOSIS — R739 Hyperglycemia, unspecified: Secondary | ICD-10-CM | POA: Diagnosis not present

## 2023-07-08 DIAGNOSIS — E7800 Pure hypercholesterolemia, unspecified: Secondary | ICD-10-CM | POA: Diagnosis not present

## 2023-07-08 DIAGNOSIS — I10 Essential (primary) hypertension: Secondary | ICD-10-CM

## 2023-07-08 LAB — HEPATIC FUNCTION PANEL
ALT: 16 U/L (ref 0–35)
AST: 25 U/L (ref 0–37)
Albumin: 3.7 g/dL (ref 3.5–5.2)
Alkaline Phosphatase: 115 U/L (ref 39–117)
Bilirubin, Direct: 0.1 mg/dL (ref 0.0–0.3)
Total Bilirubin: 0.5 mg/dL (ref 0.2–1.2)
Total Protein: 6.5 g/dL (ref 6.0–8.3)

## 2023-07-08 LAB — BASIC METABOLIC PANEL WITH GFR
BUN: 26 mg/dL — ABNORMAL HIGH (ref 6–23)
CO2: 28 meq/L (ref 19–32)
Calcium: 9.7 mg/dL (ref 8.4–10.5)
Chloride: 104 meq/L (ref 96–112)
Creatinine, Ser: 1.86 mg/dL — ABNORMAL HIGH (ref 0.40–1.20)
GFR: 24.97 mL/min — ABNORMAL LOW (ref >60.00)
Glucose, Bld: 101 mg/dL — ABNORMAL HIGH (ref 70–99)
Potassium: 4.9 meq/L (ref 3.5–5.1)
Sodium: 138 meq/L (ref 135–145)

## 2023-07-08 LAB — LIPID PANEL
Cholesterol: 139 mg/dL (ref 0–200)
HDL: 40.6 mg/dL (ref >39.00)
LDL Cholesterol: 66 mg/dL (ref 0–99)
NonHDL: 98.01
Total CHOL/HDL Ratio: 3
Triglycerides: 160 mg/dL — ABNORMAL HIGH (ref 0.0–149.0)
VLDL: 32 mg/dL (ref 0.0–40.0)

## 2023-07-08 LAB — HEMOGLOBIN A1C: Hgb A1c MFr Bld: 5.7 % (ref 4.6–6.5)

## 2023-07-12 ENCOUNTER — Ambulatory Visit (INDEPENDENT_AMBULATORY_CARE_PROVIDER_SITE_OTHER): Payer: HMO | Admitting: Internal Medicine

## 2023-07-12 VITALS — BP 128/70 | HR 75 | Temp 98.0°F | Resp 16 | Ht 64.0 in | Wt 145.8 lb

## 2023-07-12 DIAGNOSIS — D509 Iron deficiency anemia, unspecified: Secondary | ICD-10-CM

## 2023-07-12 DIAGNOSIS — E039 Hypothyroidism, unspecified: Secondary | ICD-10-CM

## 2023-07-12 DIAGNOSIS — K219 Gastro-esophageal reflux disease without esophagitis: Secondary | ICD-10-CM

## 2023-07-12 DIAGNOSIS — I700 Atherosclerosis of aorta: Secondary | ICD-10-CM

## 2023-07-12 DIAGNOSIS — I4891 Unspecified atrial fibrillation: Secondary | ICD-10-CM | POA: Diagnosis not present

## 2023-07-12 DIAGNOSIS — E7800 Pure hypercholesterolemia, unspecified: Secondary | ICD-10-CM | POA: Diagnosis not present

## 2023-07-12 DIAGNOSIS — N184 Chronic kidney disease, stage 4 (severe): Secondary | ICD-10-CM | POA: Diagnosis not present

## 2023-07-12 DIAGNOSIS — F439 Reaction to severe stress, unspecified: Secondary | ICD-10-CM | POA: Diagnosis not present

## 2023-07-12 DIAGNOSIS — R739 Hyperglycemia, unspecified: Secondary | ICD-10-CM

## 2023-07-12 DIAGNOSIS — I351 Nonrheumatic aortic (valve) insufficiency: Secondary | ICD-10-CM

## 2023-07-12 DIAGNOSIS — I10 Essential (primary) hypertension: Secondary | ICD-10-CM

## 2023-07-12 DIAGNOSIS — R748 Abnormal levels of other serum enzymes: Secondary | ICD-10-CM | POA: Diagnosis not present

## 2023-07-12 DIAGNOSIS — D696 Thrombocytopenia, unspecified: Secondary | ICD-10-CM

## 2023-07-12 MED ORDER — AMLODIPINE BESYLATE 5 MG PO TABS: 5.0000 mg | ORAL_TABLET | Freq: Every day | ORAL | 1 refills | Status: DC

## 2023-07-12 MED ORDER — GABAPENTIN 100 MG PO CAPS: ORAL_CAPSULE | ORAL | 3 refills | Status: DC

## 2023-07-12 NOTE — Progress Notes (Signed)
 Subjective:    Patient ID: Caitlin Lin, female    DOB: 09/05/1940, 83 y.o.   MRN: [id number]  Patient here for  Chief Complaint  Patient presents with   Medical Management of Chronic Issues    HPI Here for a scheduled follow up - follow up regarding hypercholesterolemia, hypertension, GERD and sleep issues. Last visit, started gabapentin . Feels was helping. Feels now may need to increase the dose. Tolerating. Saw urology 05/24/23. Trial of myrbetriq . Had f/u with nephrology 06/14/23. No changes made.  No chest pain reported. Breathing stable. No abdomina pain reported. Discussed stool softener and water - to help keep bowels moving.    Past Medical History:  Diagnosis Date   Cataracts, bilateral    Chronic kidney disease    Followed by Dr. Rhesa Celeste   Chronic kidney disease    Colon polyp    Constipation due to slow transit    Diarrhea    Diarrhea    GERD (gastroesophageal reflux disease)    Heart murmur    History of cyst of breast    History of hiatal hernia    Hyperlipidemia    Hypertension    Hypothyroidism    Melanoma of lower leg (HCC) 2013   Shingles 2013   Shingles    Spinal stenosis in cervical region    Dr. Erman Hayward   Subdural hematoma (HCC)    Thyroid  disease    Past Surgical History:  Procedure Laterality Date   ABDOMINAL HYSTERECTOMY  1974   ANTERIOR AND POSTERIOR VAGINAL REPAIR     ANTERIOR AND POSTERIOR VAGINAL REPAIR W/ SACROSPINOUS LIGAMENT SUSPENSION Right    BREAST BIOPSY  1963   Benign per pt's report   BREAST EXCISIONAL BIOPSY Right 1963   neg   COLONOSCOPY     COLONOSCOPY N/A 10/29/2021   Procedure: COLONOSCOPY;  Surgeon: Toledo, Alphonsus Jeans, MD;  Location: [facility] ENDOSCOPY;  Service: Gastroenterology;  Laterality: N/A;   ELECTROMAGNETIC NAVIGATION BROCHOSCOPY Right 01/09/2019   Procedure: ELECTROMAGNETIC NAVIGATION BRONCHOSCOPY;  Surgeon: Marc Senior, MD;  Location: [facility] ORS;  Service: Cardiopulmonary;  Laterality: Right;    ESOPHAGOGASTRODUODENOSCOPY     ESOPHAGOGASTRODUODENOSCOPY (EGD) WITH PROPOFOL  N/A 11/10/2016   Procedure: ESOPHAGOGASTRODUODENOSCOPY (EGD) WITH PROPOFOL ;  Surgeon: Deveron Fly, MD;  Location: [facility] ENDOSCOPY;  Service: Endoscopy;  Laterality: N/A;   ESOPHAGOGASTRODUODENOSCOPY (EGD) WITH PROPOFOL  N/A 09/09/2018   Procedure: ESOPHAGOGASTRODUODENOSCOPY (EGD) WITH PROPOFOL ;  Surgeon: Deveron Fly, MD;  Location: [facility] ENDOSCOPY;  Service: Endoscopy;  Laterality: N/A;   ESOPHAGOGASTRODUODENOSCOPY (EGD) WITH PROPOFOL  N/A 10/27/2019   Procedure: ESOPHAGOGASTRODUODENOSCOPY (EGD) WITH PROPOFOL ;  Surgeon: Marshall Skeeter, MD;  Location: [facility] ENDOSCOPY;  Service: Endoscopy;  Laterality: N/A;   ESOPHAGOGASTRODUODENOSCOPY (EGD) WITH PROPOFOL  N/A 08/09/2020   Procedure: ESOPHAGOGASTRODUODENOSCOPY (EGD) WITH PROPOFOL ;  Surgeon: Shane Darling, MD;  Location: [facility] ENDOSCOPY;  Service: Endoscopy;  Laterality: N/A;   LUNG BIOPSY  2007   Benign per pt's report   LUNG BIOPSY Right    OOPHORECTOMY  1970   PERINEOPLASTY  12/17/2013   sling for stress incontinence  12/18/2011   Family History  Problem Relation Age of Onset   Arthritis Mother    Liver disease Mother    Cirrhosis Mother    Liver cancer Mother    Heart disease Father    Heart attack Father    Coronary artery disease Father    Depression Brother    Prostate cancer Brother    Diabetes Maternal Aunt    Breast cancer Cousin  maternal cousins   Liver cancer Cousin    Breast cancer Other    Social History   Socioeconomic History   Marital status: Married    Spouse name: Not on file   Number of children: 2   Years of education: Not on file   Highest education level: Not on file  Occupational History   Occupation: Retired  Tobacco Use   Smoking status: Never    Passive exposure: Never   Smokeless tobacco: Never  Vaping Use   Vaping status: Never Used  Substance and Sexual Activity   Alcohol use: No   Drug  use: No   Sexual activity: Yes  Other Topics Concern   Not on file  Social History Narrative   Lives in [place] with husband. Has 2 children boy and girl, 4 grandchildren. Retired Insurance underwriter.       Daily Caffeine Use:  NO   Regular Exercise -  Not at this time due to back pain, would like to resume soon   Social Drivers of Corporate investment banker Strain: Low Risk  (12/01/2022)   Overall Financial Resource Strain (CARDIA)    Difficulty of Paying Living Expenses: Not hard at all  Food Insecurity: No Food Insecurity (12/01/2022)   Hunger Vital Sign    Worried About Running Out of Food in the Last Year: Never true    Ran Out of Food in the Last Year: Never true  Transportation Needs: No Transportation Needs (12/01/2022)   PRAPARE - Administrator, Civil Service (Medical): No    Lack of Transportation (Non-Medical): No  Physical Activity: Sufficiently Active (12/01/2022)   Exercise Vital Sign    Days of Exercise per Week: 5 days    Minutes of Exercise per Session: 40 min  Stress: No Stress Concern Present (12/01/2022)   [employer] of Occupational Health - Occupational Stress Questionnaire    Feeling of Stress : Only a little  Social Connections: Socially Integrated (12/01/2022)   Social Connection and Isolation Panel [NHANES]    Frequency of Communication with Friends and Family: More than three times a week    Frequency of Social Gatherings with Friends and Family: Three times a week    Attends Religious Services: More than 4 times per year    Active Member of Clubs or Organizations: Yes    Attends Banker Meetings: More than 4 times per year    Marital Status: Married     Review of Systems  Constitutional:  Negative for appetite change and unexpected weight change.  HENT:  Negative for congestion and sinus pressure.   Respiratory:  Negative for cough, chest tightness and shortness of breath.   Cardiovascular:  Negative for chest pain,  palpitations and leg swelling.  Gastrointestinal:  Negative for abdominal pain, diarrhea, nausea and vomiting.  Genitourinary:  Negative for difficulty urinating and dysuria.  Musculoskeletal:  Negative for joint swelling and myalgias.  Skin:  Negative for color change and rash.  Neurological:  Negative for dizziness and headaches.  Psychiatric/Behavioral:  Negative for agitation and dysphoric mood.        Objective:     BP 128/70   Pulse 75   Temp 98 F (36.7 C)   Resp 16   Ht 5\' 4"  (1.626 m)   Wt 145 lb 12.8 oz (66.1 kg)   SpO2 98%   BMI 25.03 kg/m  Wt Readings from Last 3 Encounters:  07/15/23 146 lb (66.2 kg)  07/12/23 145  lb 12.8 oz (66.1 kg)  05/24/23 149 lb 9.6 oz (67.9 kg)    Physical Exam Vitals reviewed.  Constitutional:      General: She is not in acute distress.    Appearance: Normal appearance.  HENT:     Head: Normocephalic and atraumatic.     Right Ear: External ear normal.     Left Ear: External ear normal.     Mouth/Throat:     Pharynx: No oropharyngeal exudate or posterior oropharyngeal erythema.  Eyes:     General: No scleral icterus.       Right eye: No discharge.        Left eye: No discharge.     Conjunctiva/sclera: Conjunctivae normal.  Neck:     Thyroid : No thyromegaly.  Cardiovascular:     Rate and Rhythm: Normal rate and regular rhythm.  Pulmonary:     Effort: No respiratory distress.     Breath sounds: Normal breath sounds. No wheezing.  Abdominal:     General: Bowel sounds are normal.     Palpations: Abdomen is soft.     Tenderness: There is no abdominal tenderness.  Musculoskeletal:        General: No swelling or tenderness.     Cervical back: Neck supple. No tenderness.  Lymphadenopathy:     Cervical: No cervical adenopathy.  Skin:    Findings: No erythema or rash.  Neurological:     Mental Status: She is alert.  Psychiatric:        Mood and Affect: Mood normal.        Behavior: Behavior normal.         Outpatient  Encounter Medications as of 07/12/2023  Medication Sig   chlorthalidone (HYGROTON) 25 MG tablet Take 12.5 mg by mouth every morning.   Alpha-Lipoic Acid 200 MG TABS Take 200 mg by mouth 3 (three) times daily.   amLODipine  (NORVASC ) 5 MG tablet Take 1 tablet (5 mg total) by mouth daily.   aspirin  81 MG tablet Take 1 tablet (81 mg total) by mouth daily.   atorvastatin  (LIPITOR) 20 MG tablet Take 1 tablet (20 mg total) by mouth daily.   Cholecalciferol  25 MCG (1000 UT) tablet Take 1,000 Units by mouth daily.   Docusate Calcium  (STOOL SOFTENER PO) Take by mouth.   gabapentin  (NEURONTIN ) 100 MG capsule Take 2 capsules q hs   levothyroxine  (SYNTHROID ) 75 MCG tablet TAKE 1 TABLET BY MOUTH ONCE DAILY IN THE MORNING BEFORE BREAKFAST   mirabegron  ER (MYRBETRIQ ) 50 MG TB24 tablet Take 1 tablet (50 mg total) by mouth daily. (Patient not taking: Reported on 07/15/2023)   Multiple Vitamin (MULTIVITAMIN) tablet Take 1 tablet by mouth daily.   Omega-3 Fatty Acids (FISH OIL ) 1000 MG CAPS Take 1,000 mg by mouth daily.   pantoprazole  (PROTONIX ) 40 MG tablet Take 1 tablet (40 mg total) by mouth daily. Take 30 minutes before breakfast   Polyethylene Glycol 3350 (MIRALAX PO) Take by mouth as needed.   trimethoprim  (TRIMPEX ) 100 MG tablet Take 1 tablet (100 mg total) by mouth daily.   Vibegron  (GEMTESA ) 75 MG TABS Take 1 tablet (75 mg total) by mouth daily.   Wheat Dextrin (BENEFIBER PO) Take by mouth.   [DISCONTINUED] amLODipine  (NORVASC ) 5 MG tablet Take 1 tablet (5 mg total) by mouth daily.   [DISCONTINUED] gabapentin  (NEURONTIN ) 100 MG capsule Take 1 capsule (100 mg total) by mouth at bedtime.   No facility-administered encounter medications on file as of 07/12/2023.  Lab Results  Component Value Date   WBC 10.1 03/04/2023   HGB 12.7 03/04/2023   HCT 38.3 03/04/2023   PLT 182.0 03/04/2023   GLUCOSE 101 (H) 07/08/2023   CHOL 139 07/08/2023   TRIG 160.0 (H) 07/08/2023   HDL 40.60 07/08/2023   LDLDIRECT  80.0 01/05/2022   LDLCALC 66 07/08/2023   ALT 16 07/08/2023   AST 25 07/08/2023   NA 138 07/08/2023   K 4.9 07/08/2023   CL 104 07/08/2023   CREATININE 1.86 (H) 07/08/2023   BUN 26 (H) 07/08/2023   CO2 28 07/08/2023   TSH 0.88 10/08/2022   INR 1.1 06/18/2022   HGBA1C 5.7 07/08/2023   MICROALBUR 2.1 (H) 03/27/2015    DG Chest 2 View Result Date: 03/04/2023 CLINICAL DATA:  Cough.  Congestion.  Wheezing. EXAM: CHEST - 2 VIEW COMPARISON:  06/18/2022 FINDINGS: Hyperinflation. Midline trachea. Normal heart size. Atherosclerosis in the transverse aorta. No pleural effusion or pneumothorax. Right upper lobe calcified granuloma was detailed on prior CT. Lungs are otherwise clear. S shaped thoracolumbar spine curvature. IMPRESSION: Hyperinflation, without acute disease. Aortic Atherosclerosis (ICD10-I70.0). Electronically Signed   By: Lore Rode M.D.   On: 03/04/2023 11:28       Assessment & Plan:  Iron deficiency anemia, unspecified iron deficiency anemia type Assessment & Plan: Follow cbc and iron studies.    Hypercholesterolemia Assessment & Plan: Continue lipitor. Low cholesterol diet and exercise.  Follow lipid panel and liver function tests.   Orders: -     Lipid panel; Future -     Hepatic function panel; Future  Hyperglycemia Assessment & Plan: Low carb diet and exercise. Follow met b and A1c.   Orders: -     Hemoglobin A1c; Future  Primary hypertension Assessment & Plan: Continues on amlodipine . Blood pressure as outlined. No changes in medication. Follow metabolic panel.   Orders: -     Basic metabolic panel with GFR; Future  Atrial fibrillation, unspecified type [facility]) Assessment & Plan: Was found to have new onset afib with RVR (up to 186) - during recent hospitalization. Was given IV cardizem  - followed by oral cardizem .  CXR negative.  Dr Alvenia Aus consulted. Found to have UTI - E.coli pan sensitive.  IV abx and discharged on cipro  for 10 days total abx.  Not  started on anticoagulants.  Was felt afib - related to acute illness.  Zio monitor placed.  Discharged on metoprolol . Had f/u with cardiology 08/21/22. No further arrhythmias noted. Previous myoview  - no ischemia.  Echo with normal EF.  Recommended continuing aspirin  and statin. Hold anticoagulation - lone afib in the setting of UTI sepsis.  Overall doing well. Appears to be in SR today. Overall stable. Follow.   Orders: -     TSH; Future  Aortic atherosclerosis (HCC) Assessment & Plan: Continue lipitor.    Nonrheumatic aortic valve insufficiency Assessment & Plan: ECHO - aortic regurgitation.  Saw cardiology.  Coronary artery calcifications on noncontrast chest CT. Previous echocardiogram with normal systolic and diastolic function.  Lexiscan  Myoview  03/2020 with no significant ischemia, low risk.  Continue aspirin , statin.  Saw cardiology 10/14/22 - f/u low blood pressure and increased heart rate.  Was feeling better. Given her issues with increased potassium and renal function - micardis was stopped.   Started amlodipine  5mg  q day.  Blood pressure as outlined. No changes in medication today. Follow.    CKD (chronic kidney disease) stage 4, GFR 15-29 ml/min (HCC) Assessment & Plan: On amlodipine . Followed  by nephrology. Avoid antiinflammatory medications. Follow metabolic panel.    Elevated alkaline phosphatase level Assessment & Plan: Follow liver panel.  Previous GGT wnl.  Intact PTH and vitamin D  wnl.     Thrombocytopenia (HCC) Assessment & Plan: Follow cbc.    Stress Assessment & Plan: Continue zoloft . Appears to be doing well. Follow.    Hypothyroidism, unspecified type Assessment & Plan: On thyroid  replacement. Follow tsh.    Gastroesophageal reflux disease without esophagitis Assessment & Plan: EGD - 08/09/20 - mild reactive gastropathy, fundic gland polyps, mucosa with edema and congestion. Persistent abdominal discomfort as outlined previous note.  Saw GI 12/2022.  Recommended protonix  bid.  Recommended EGD. Was scheduled. Had to be canceled due to RSV.  Reschedule EGD.    Other orders -     amLODIPine  Besylate; Take 1 tablet (5 mg total) by mouth daily.  Dispense: 90 tablet; Refill: 1 -     Gabapentin ; Take 2 capsules q hs  Dispense: 60 capsule; Refill: 3     Dellar Fenton, MD

## 2023-07-14 NOTE — Progress Notes (Signed)
 Cardiology Clinic Note   Date: 07/15/2023 ID: Caitlin Lin, DOB [birth year], MRN [id number]  Primary Cardiologist:  Constancia Delton, MD  Chief Complaint   Caitlin Lin is a 83 y.o. female who presents to the clinic today for routine follow up.   Patient Profile   Caitlin Lin is followed by Dr. Junnie Olives for the history outlined below.      Past medical history significant for: Coronary calcifications/aortic atherosclerosis. Nuclear stress test 04/12/2020: No significant ischemia.  Small region of fixed defect apical, apical lateral wall possible attenuation artifact.  No EKG changes concerning for ischemia at peak stress or in recovery.  Rare PVC noted.  Low risk study. PAF. Onset April 2024 in the setting of pyelonephritis. Mitral/aortic valve regurgitation. Echo 06/20/2022: EF 55 to 60%.  No RWMA.  Grade II DD.  Normal RV size/function.  Mild MR.  Mild to moderate AI. PSVT. 14-day ZIO 07/22/2022: HR 38 to 167 bpm, average 61 bpm.  Predominantly sinus rhythm.  35 runs of SVT fastest 5 beats max rate 167 bpm, longest 13.7 seconds average rate 99 bpm.  Rare ectopy.  No evidence of A-fib/flutter. Hypertension. Hyperlipidemia. Lipid panel 07/08/2023: LDL 66, HDL 41, TG 160, total 139. GERD. Hypothyroidism. CKD stage IV.  In summary, patient was previously followed by Dr. Lina Render for chest pain in 2018.  She had a normal nuclear stress test at that time.  Echo demonstrated normal LV/RV function with Grade I DD and mild MR/AI.  She establish care with Dr. Junnie Olives on 12/01/2018.  Echo in October 2020 demonstrated normal LV/RV function with mildly elevated PA pressure, no MR, mild AI, mild dilatation of ascending aorta 37 mm.  In January 2022 patient complained of intermittent chest pressure not related to exertion prompting an ED visit the month prior with unremarkable workup.  Nuclear stress test was low risk as detailed above.  In April 2024 patient presented to the ED with  flank pain and urinary frequency x 2 days.  She was found to be in A-fib with HR 186 bpm.  She converted to normal sinus rhythm with 10 mg of IV Cardizem .  Echo demonstrated normal LV/RV function as detailed above.  Patient wore a 14-day ZIO which showed no evidence of A-fib/a-flutter and showed runs of SVT as detailed above.  Decision was made to not start OAC.  She was discharged on metoprolol .  Patient experienced fatigue on metoprolol  and decision was made to discontinue it.  Patient was last seen in the office by Dr. Junnie Olives on 10/14/2022 for evaluation of elevated heart rate and low BP.  Patient was diagnosed with urinary tract infection.  She had some dizziness and SBP was in the 90s.  Amlodipine  was held by PCP.  She was treated with antibiotics and she felt improved with SBP in the 140s to 160s.  She was instructed to restart amlodipine  with instructions to hold amlodipine  if BP becomes lower particularly in setting of UTI.     History of Present Illness    Today, patient reports she is doing very well. Patient denies shortness of breath, dyspnea on exertion, orthopnea or PND. Lower extremity edema in bilateral ankles if she does not wear compression socks. It is typically normal in the morning and will progress throughout the day. No chest pain, pressure, or tightness. She reports occasional sensation that heart is racing that lasts a second and resolves on its own. No sustained palpitations. She was active using a seated peddler but it  began to irritate her knee so she has slowed down using it. She is taking medication to prevent UTIs and has not had one recently.     ROS: All other systems reviewed and are otherwise negative except as noted in History of Present Illness.  EKGs/Labs Reviewed    EKG Interpretation Date/Time:  Thursday Jul 15 2023 10:39:38 EDT Ventricular Rate:  73 PR Interval:  188 QRS Duration:  72 QT Interval:  382 QTC Calculation: 420 R Axis:   16  Text  Interpretation: Normal sinus rhythm Low voltage QRS Nonspecific ST abnormality When compared with ECG of 27-Nov-2022 13:00, No significant change was found Confirmed by Morey Ar [phone]) on 07/15/2023 10:47:23 AM   07/08/2023: ALT 16; AST 25; BUN 26; Creatinine, Ser 1.86; Potassium 4.9; Sodium 138   03/04/2023: Hemoglobin 12.7; WBC 10.1   10/08/2022: TSH 0.88    Physical Exam    VS:  BP 124/66   Pulse 96   Ht 5\' 4"  (1.626 m)   Wt 146 lb (66.2 kg)   SpO2 98%   BMI 25.06 kg/m  , BMI Body mass index is 25.06 kg/m.  GEN: Well nourished, well developed, in no acute distress. Neck: No JVD or carotid bruits. Cardiac:  RRR. 1/6 systolic murmur. No rubs or gallops.   Respiratory:  Respirations regular and unlabored. Clear to auscultation without rales, wheezing or rhonchi. GI: Soft, nontender, nondistended. Extremities: Radials/DP/PT 2+ and equal bilaterally. No clubbing or cyanosis.Mild nonpitting edema bilateral ankles.   Skin: Warm and dry, no rash. Neuro: Strength intact.  Assessment & Plan   Coronary calcification/aortic atherosclerosis Nuclear stress test January 2022 was a low risk study.  Patient denies chest pain, pressure or tightness. She was using a seated peddler for exercise but it began to hurt her knee so she has slowed down using. Discussed looking into seated yoga to increase physical activity.  - Advance physical activity as tolerated.  - Continue aspirin , atorvastatin .  PAF/PSVT Onset April 2024 in the setting of pyelonephritis.  She converted to NSR with 10 mg IV Cardizem .  14-day ZIO May 2024 showed no evidence of A-fib/a-flutter and 35 runs of SVT.  Given isolated episode of A-fib in the setting of pyelonephritis decision was made to not start OAC.  Patient had fatigue on metoprolol .  Patient reports occasional very brief sensation of heart racing that lasts a second and resolves on its own. EKG shows NSR 73 bpm.  - Patient is instructed to contact the office  if palpitations become persistent or increase in frequency.   Mitral/aortic valve regurgitation Echo April 2024 showed mild MR and mild to moderate AI.  Patient denies shortness of breath, DOE, orthopnea or PND. She will get lower extremity edema if she does not wear compression socks. 1/6 systolic murmur on exam today.  - Repeat echo as clinically indicated.   Hypertension BP today 124/66. No report of headaches or dizziness.  - Continue amlodipine , chlorthalidone.  Hyperlipidemia LDL 66 April 2025, at goal. - Continue atorvastatin .  Disposition: Return in 1 year or sooner as needed.          Signed, Lonell Rives. Love Chowning, DNP, NP-C

## 2023-07-15 ENCOUNTER — Ambulatory Visit: Attending: Student | Admitting: Student

## 2023-07-15 ENCOUNTER — Encounter: Payer: Self-pay | Admitting: Student

## 2023-07-15 VITALS — BP 124/66 | HR 96 | Ht 64.0 in | Wt 146.0 lb

## 2023-07-15 DIAGNOSIS — E785 Hyperlipidemia, unspecified: Secondary | ICD-10-CM | POA: Diagnosis not present

## 2023-07-15 DIAGNOSIS — I480 Paroxysmal atrial fibrillation: Secondary | ICD-10-CM

## 2023-07-15 DIAGNOSIS — I2510 Atherosclerotic heart disease of native coronary artery without angina pectoris: Secondary | ICD-10-CM | POA: Diagnosis not present

## 2023-07-15 DIAGNOSIS — I10 Essential (primary) hypertension: Secondary | ICD-10-CM | POA: Diagnosis not present

## 2023-07-15 DIAGNOSIS — I340 Nonrheumatic mitral (valve) insufficiency: Secondary | ICD-10-CM | POA: Diagnosis not present

## 2023-07-15 DIAGNOSIS — I471 Supraventricular tachycardia, unspecified: Secondary | ICD-10-CM

## 2023-07-15 DIAGNOSIS — I351 Nonrheumatic aortic (valve) insufficiency: Secondary | ICD-10-CM

## 2023-07-15 NOTE — Patient Instructions (Signed)
 Medication Instructions:  Your Physician recommend you continue on your current medication as directed.    *If you need a refill on your cardiac medications before your next appointment, please call your pharmacy*   Follow-Up: At [facility], you and your health needs are our priority.  As part of our continuing mission to provide you with exceptional heart care, our providers are all part of one team.  This team includes your primary Cardiologist (physician) and Advanced Practice Providers or APPs (Physician Assistants and Nurse Practitioners) who all work together to provide you with the care you need, when you need it.  Your next appointment:   12 month(s)  Provider:   You may see Constancia Delton, MD or one of the following Advanced Practice Providers on your designated Care Team:   Laneta Pintos, NP Gildardo Labrador, PA-C Varney Gentleman, PA-C Cadence [place], PA-C Ronald Cockayne, NP Morey Ar, NP    We recommend signing up for the patient portal called "MyChart".  Sign up information is provided on this After Visit Summary.  MyChart is used to connect with patients for Virtual Visits (Telemedicine).  Patients are able to view lab/test results, encounter notes, upcoming appointments, etc.  Non-urgent messages can be sent to your provider as well.   To learn more about what you can do with MyChart, go to [email or web address].

## 2023-07-18 ENCOUNTER — Encounter: Payer: Self-pay | Admitting: Internal Medicine

## 2023-07-18 NOTE — Assessment & Plan Note (Signed)
 On thyroid replacement.  Follow tsh.

## 2023-07-18 NOTE — Assessment & Plan Note (Signed)
 Follow cbc.

## 2023-07-18 NOTE — Assessment & Plan Note (Signed)
 Continue zoloft . Appears to be doing well. Follow.

## 2023-07-18 NOTE — Assessment & Plan Note (Signed)
 Continue lipitor.  Low cholesterol diet and exercise.  Follow lipid panel and liver function tests.

## 2023-07-18 NOTE — Assessment & Plan Note (Signed)
 Follow cbc and iron studies.

## 2023-07-18 NOTE — Assessment & Plan Note (Signed)
Follow liver panel.  Previous GGT wnl.  Intact PTH and vitamin D wnl.

## 2023-07-18 NOTE — Assessment & Plan Note (Signed)
 Was found to have new onset afib with RVR (up to 186) - during recent hospitalization. Was given IV cardizem  - followed by oral cardizem .  CXR negative.  Dr Alvenia Aus consulted. Found to have UTI - E.coli pan sensitive.  IV abx and discharged on cipro  for 10 days total abx.  Not started on anticoagulants.  Was felt afib - related to acute illness.  Zio monitor placed.  Discharged on metoprolol . Had f/u with cardiology 08/21/22. No further arrhythmias noted. Previous myoview  - no ischemia.  Echo with normal EF.  Recommended continuing aspirin  and statin. Hold anticoagulation - lone afib in the setting of UTI sepsis.  Overall doing well. Appears to be in SR today. Overall stable. Follow.

## 2023-07-18 NOTE — Assessment & Plan Note (Signed)
 Low-carb diet and exercise.  Follow met b and A1c.

## 2023-07-18 NOTE — Assessment & Plan Note (Signed)
 EGD - 08/09/20 - mild reactive gastropathy, fundic gland polyps, mucosa with edema and congestion. Persistent abdominal discomfort as outlined previous note.  Saw GI 12/2022. Recommended protonix  bid.  Recommended EGD. Was scheduled. Had to be canceled due to RSV.  Reschedule EGD.

## 2023-07-18 NOTE — Assessment & Plan Note (Signed)
 Continues on amlodipine . Blood pressure as outlined. No changes in medication. Follow metabolic panel.

## 2023-07-18 NOTE — Assessment & Plan Note (Signed)
 Continue lipitor  ?

## 2023-07-18 NOTE — Assessment & Plan Note (Signed)
 On amlodipine . Followed by nephrology. Avoid antiinflammatory medications. Follow metabolic panel.

## 2023-07-18 NOTE — Assessment & Plan Note (Signed)
 ECHO - aortic regurgitation.  Saw cardiology.  Coronary artery calcifications on noncontrast chest CT. Previous echocardiogram with normal systolic and diastolic function.  Lexiscan  Myoview  03/2020 with no significant ischemia, low risk.  Continue aspirin , statin.  Saw cardiology 10/14/22 - f/u low blood pressure and increased heart rate.  Was feeling better. Given her issues with increased potassium and renal function - micardis was stopped.   Started amlodipine  5mg  q day.  Blood pressure as outlined. No changes in medication today. Follow.

## 2023-07-26 ENCOUNTER — Ambulatory Visit: Admitting: Urology

## 2023-08-03 DIAGNOSIS — G8929 Other chronic pain: Secondary | ICD-10-CM | POA: Diagnosis not present

## 2023-08-03 DIAGNOSIS — M1711 Unilateral primary osteoarthritis, right knee: Secondary | ICD-10-CM | POA: Diagnosis not present

## 2023-08-03 DIAGNOSIS — M25461 Effusion, right knee: Secondary | ICD-10-CM | POA: Diagnosis not present

## 2023-08-11 ENCOUNTER — Ambulatory Visit: Admitting: Urology

## 2023-08-16 DIAGNOSIS — R350 Frequency of micturition: Secondary | ICD-10-CM | POA: Diagnosis not present

## 2023-08-16 DIAGNOSIS — N390 Urinary tract infection, site not specified: Secondary | ICD-10-CM | POA: Diagnosis not present

## 2023-08-16 DIAGNOSIS — N289 Disorder of kidney and ureter, unspecified: Secondary | ICD-10-CM | POA: Diagnosis not present

## 2023-08-16 DIAGNOSIS — B3731 Acute candidiasis of vulva and vagina: Secondary | ICD-10-CM | POA: Diagnosis not present

## 2023-09-09 DIAGNOSIS — R197 Diarrhea, unspecified: Secondary | ICD-10-CM | POA: Diagnosis not present

## 2023-09-09 DIAGNOSIS — R1013 Epigastric pain: Secondary | ICD-10-CM | POA: Diagnosis not present

## 2023-09-15 ENCOUNTER — Ambulatory Visit
Admission: EM | Admit: 2023-09-15 | Discharge: 2023-09-15 | Disposition: A | Discharge status: Home / Self Care | Source: Ambulatory Visit | Attending: Emergency Medicine | Admitting: Emergency Medicine

## 2023-09-15 DIAGNOSIS — R300 Dysuria: Secondary | ICD-10-CM | POA: Insufficient documentation

## 2023-09-15 LAB — POCT URINALYSIS DIP (MANUAL ENTRY)
Bilirubin, UA: NEGATIVE
Glucose, UA: NEGATIVE mg/dL
Nitrite, UA: NEGATIVE
Protein Ur, POC: 100 mg/dL — AB
Spec Grav, UA: 1.01 (ref 1.010–1.025)
Urobilinogen, UA: 0.2 U/dL
pH, UA: 5 (ref 5.0–8.0)

## 2023-09-15 MED ORDER — CEPHALEXIN 500 MG PO CAPS: 500.0000 mg | ORAL_CAPSULE | Freq: Three times a day (TID) | ORAL | 0 refills | Status: DC

## 2023-09-15 NOTE — Discharge Instructions (Addendum)
Take the antibiotic as directed.  The urine culture is pending.  We will call you if it shows the need to change or discontinue your antibiotic.    Follow up with your primary care provider or urologist.    

## 2023-09-15 NOTE — ED Provider Notes (Signed)
 Caitlin Lin    CSN: [id number] Arrival date & time: 09/15/23  1845      History   Chief Complaint Chief Complaint  Patient presents with   Urinary Frequency    HPI Caitlin Lin is a 83 y.o. female.  Patient presents with dysuria, urinary frequency, bladder pressure with urination, low back pain since this afternoon.  No fever, chills, abdominal pain, flank pain, hematuria.  No OTC medications taken today.  Patient was seen at [facility] clinic on 08/16/2023; diagnosed with complicated UTI, kidney insufficiency, candidal vaginitis, frequency of urination; treated with Cipro  x 7 days and 1 dose of Diflucan.  Her medical history includes CKD stage 4.  The history is provided by the patient and medical records.    Past Medical History:  Diagnosis Date   Cataracts, bilateral    Chronic kidney disease    Followed by Dr. Marcelino   Chronic kidney disease    Colon polyp    Constipation due to slow transit    Diarrhea    Diarrhea    GERD (gastroesophageal reflux disease)    Heart murmur    History of cyst of breast    History of hiatal hernia    Hyperlipidemia    Hypertension    Hypothyroidism    Melanoma of lower leg (HCC) 2013   Shingles 2013   Shingles    Spinal stenosis in cervical region    Dr. Avanell   Subdural hematoma [facility])    Thyroid  disease     Patient Active Problem List   Diagnosis Date Noted   Palpitations 05/11/2023   Cough 03/04/2023   Frequent UTI 10/05/2022   Hematuria 08/25/2022   Atrial fibrillation (HCC) 07/02/2022   Coronary artery disease by CT 06/19/2022   Atrial fibrillation with rapid ventricular response (HCC) 06/18/2022   Lung nodules 04/14/2022   Weakness of both legs 09/06/2021   Thrombocytopenia (HCC) 01/03/2021   Elevated alkaline phosphatase level 09/30/2020   Hyperkalemia 09/23/2020   Stress 05/18/2020   Fatigue 03/30/2020   Leg swelling 03/30/2020   Dysphagia 08/09/2019   Sleep difficulties 11/23/2018   Hyperglycemia  11/20/2017   Aortic atherosclerosis (HCC) 06/16/2017   Low back pain 06/06/2017   Abdominal pain 06/03/2017   Fatty liver 02/13/2017   Aortic regurgitation 07/01/2016   Health care maintenance 05/12/2016   Chest pain 05/12/2016   TMJ arthralgia 09/25/2014   GERD (gastroesophageal reflux disease) 12/05/2013   Osteopenia 10/30/2013   Hypercholesterolemia 03/04/2013   Medicare annual wellness visit, subsequent 09/05/2012   Anemia, iron deficiency 06/08/2012   Screening for breast cancer 08/03/2011   Rectal prolapse 08/03/2011   Hypertension 02/09/2011   CKD (chronic kidney disease) stage 4, GFR 15-29 ml/min (HCC) 02/09/2011   Hypothyroidism 02/09/2011    Past Surgical History:  Procedure Laterality Date   ABDOMINAL HYSTERECTOMY  1974   ANTERIOR AND POSTERIOR VAGINAL REPAIR     ANTERIOR AND POSTERIOR VAGINAL REPAIR W/ SACROSPINOUS LIGAMENT SUSPENSION Right    BREAST BIOPSY  1963   Benign per pt's report   BREAST EXCISIONAL BIOPSY Right 1963   neg   COLONOSCOPY     COLONOSCOPY N/A 10/29/2021   Procedure: COLONOSCOPY;  Surgeon: Toledo, Ladell POUR, MD;  Location: [facility] ENDOSCOPY;  Service: Gastroenterology;  Laterality: N/A;   ELECTROMAGNETIC NAVIGATION BROCHOSCOPY Right 01/09/2019   Procedure: ELECTROMAGNETIC NAVIGATION BRONCHOSCOPY;  Surgeon: Tamea Dedra CROME, MD;  Location: [facility] ORS;  Service: Cardiopulmonary;  Laterality: Right;   ESOPHAGOGASTRODUODENOSCOPY     ESOPHAGOGASTRODUODENOSCOPY (  EGD) WITH PROPOFOL  N/A 11/10/2016   Procedure: ESOPHAGOGASTRODUODENOSCOPY (EGD) WITH PROPOFOL ;  Surgeon: Gaylyn Gladis PENNER, MD;  Location: [facility] ENDOSCOPY;  Service: Endoscopy;  Laterality: N/A;   ESOPHAGOGASTRODUODENOSCOPY (EGD) WITH PROPOFOL  N/A 09/09/2018   Procedure: ESOPHAGOGASTRODUODENOSCOPY (EGD) WITH PROPOFOL ;  Surgeon: Gaylyn Gladis PENNER, MD;  Location: [facility] ENDOSCOPY;  Service: Endoscopy;  Laterality: N/A;   ESOPHAGOGASTRODUODENOSCOPY (EGD) WITH PROPOFOL  N/A 10/27/2019   Procedure:  ESOPHAGOGASTRODUODENOSCOPY (EGD) WITH PROPOFOL ;  Surgeon: Dessa Reyes ORN, MD;  Location: [facility] ENDOSCOPY;  Service: Endoscopy;  Laterality: N/A;   ESOPHAGOGASTRODUODENOSCOPY (EGD) WITH PROPOFOL  N/A 08/09/2020   Procedure: ESOPHAGOGASTRODUODENOSCOPY (EGD) WITH PROPOFOL ;  Surgeon: Maryruth Ole DASEN, MD;  Location: [facility] ENDOSCOPY;  Service: Endoscopy;  Laterality: N/A;   LUNG BIOPSY  2007   Benign per pt's report   LUNG BIOPSY Right    OOPHORECTOMY  1970   PERINEOPLASTY  12/17/2013   sling for stress incontinence  12/18/2011    OB History   No obstetric history on file.      Home Medications    Prior to Admission medications   Medication Sig Start Date End Date Taking? Authorizing Provider  amLODipine  (NORVASC ) 5 MG tablet Take 1 tablet (5 mg total) by mouth daily. 07/12/23  Yes Glendia Shad, MD  aspirin  81 MG tablet Take 1 tablet (81 mg total) by mouth daily. 10/15/15  Yes Vannie Delon LABOR, MD  atorvastatin  (LIPITOR) 20 MG tablet Take 1 tablet (20 mg total) by mouth daily. 10/30/22  Yes Glendia Shad, MD  cephALEXin (KEFLEX) 500 MG capsule Take 1 capsule (500 mg total) by mouth every 8 (eight) hours for 5 days. 09/15/23 09/20/23 Yes Corlis Burnard DEL, NP  chlorthalidone (HYGROTON) 25 MG tablet Take 12.5 mg by mouth every morning. 06/23/23  Yes [provider]  Cholecalciferol  25 MCG (1000 UT) tablet Take 1,000 Units by mouth daily.   Yes [provider]  gabapentin  (NEURONTIN ) 100 MG capsule Take 2 capsules q hs 07/12/23  Yes Glendia Shad, MD  levothyroxine  (SYNTHROID ) 75 MCG tablet TAKE 1 TABLET BY MOUTH ONCE DAILY IN THE MORNING BEFORE BREAKFAST 10/30/22  Yes Glendia Shad, MD  Multiple Vitamin (MULTIVITAMIN) tablet Take 1 tablet by mouth daily.   Yes [provider]  Omega-3 Fatty Acids (FISH OIL ) 1000 MG CAPS Take 1,000 mg by mouth daily.   Yes [provider]  pantoprazole  (PROTONIX ) 40 MG tablet Take 1 tablet (40 mg total) by mouth daily. Take  30 minutes before breakfast 10/30/22  Yes Glendia Shad, MD  trimethoprim  (TRIMPEX ) 100 MG tablet Take 1 tablet (100 mg total) by mouth daily. 10/05/22  Yes MacDiarmid, Glendia, MD  Wheat Dextrin (BENEFIBER PO) Take by mouth.   Yes [provider]  Alpha-Lipoic Acid 200 MG TABS Take 200 mg by mouth 3 (three) times daily.    [provider]  Docusate Calcium  (STOOL SOFTENER PO) Take by mouth.    [provider]  mirabegron  ER (MYRBETRIQ ) 50 MG TB24 tablet Take 1 tablet (50 mg total) by mouth daily. Patient not taking: Reported on 07/15/2023 05/24/23   MacDiarmid, Scott, MD  Polyethylene Glycol 3350 (MIRALAX PO) Take by mouth as needed.    [provider]  Vibegron  (GEMTESA ) 75 MG TABS Take 1 tablet (75 mg total) by mouth daily. 03/29/23   Gaston Glendia, MD    Family History Family History  Problem Relation Age of Onset   Arthritis Mother    Liver disease Mother    Cirrhosis Mother    Liver  cancer Mother    Heart disease Father    Heart attack Father    Coronary artery disease Father    Depression Brother    Prostate cancer Brother    Diabetes Maternal Aunt    Breast cancer Cousin        maternal cousins   Liver cancer Cousin    Breast cancer Other     Social History Social History   Tobacco Use   Smoking status: Never    Passive exposure: Never   Smokeless tobacco: Never  Vaping Use   Vaping status: Never Used  Substance Use Topics   Alcohol use: No   Drug use: No     Allergies   Quinapril  and Codeine   Review of Systems Review of Systems  Constitutional:  Negative for chills and fever.  Gastrointestinal:  Negative for abdominal pain and nausea.  Genitourinary:  Positive for dysuria and frequency. Negative for flank pain, hematuria and pelvic pain.  Musculoskeletal:  Positive for back pain.     Physical Exam Triage Vital Signs ED Triage Vitals  Encounter Vitals Group     BP      Girls Systolic BP Percentile      Girls  Diastolic BP Percentile      Boys Systolic BP Percentile      Boys Diastolic BP Percentile      Pulse      Resp      Temp      Temp src      SpO2      Weight      Height      Head Circumference      Peak Flow      Pain Score      Pain Loc      Pain Education      Exclude from Growth Chart    No data found.  Updated Vital Signs BP (!) 155/82   Pulse 80   Temp 98.6 F (37 C) (Oral)   Resp 18   SpO2 98%   Visual Acuity Right Eye Distance:   Left Eye Distance:   Bilateral Distance:    Right Eye Near:   Left Eye Near:    Bilateral Near:     Physical Exam Constitutional:      General: She is not in acute distress. HENT:     Mouth/Throat:     Mouth: Mucous membranes are moist.  Cardiovascular:     Rate and Rhythm: Normal rate and regular rhythm.  Pulmonary:     Effort: Pulmonary effort is normal. No respiratory distress.  Abdominal:     General: Bowel sounds are normal.     Palpations: Abdomen is soft.     Tenderness: There is no abdominal tenderness. There is no right CVA tenderness, left CVA tenderness, guarding or rebound.  Neurological:     Mental Status: She is alert.      UC Treatments / Results  Labs (all labs ordered are listed, but only abnormal results are displayed) Labs Reviewed  POCT URINALYSIS DIP (MANUAL ENTRY) - Abnormal; Notable for the following components:      Result Value   Clarity, UA cloudy (*)    Ketones, POC UA small (15) (*)    Blood, UA large (*)    Protein Ur, POC =100 (*)    Leukocytes, UA Large (3+) (*)    All other components within normal limits  URINE CULTURE    EKG   Radiology No results  found.  Procedures Procedures (including critical care time)  Medications Ordered in UC Medications - No data to display  Initial Impression / Assessment and Plan / UC Course  I have reviewed the triage vital signs and the nursing notes.  Pertinent labs & imaging results that were available during my care of the patient  were reviewed by me and considered in my medical decision making (see chart for details).   Dysuria.  Treating with Keflex. Urine culture pending. Discussed with patient that we will call her if the urine culture shows the need to change or discontinue the antibiotic. Instructed her to follow-up with her PCP or urologist.  She has an appointment with her urologist scheduled on 09/28/2023 per chart.  Patient agrees to plan of care.  Final Clinical Impressions(s) / UC Diagnoses   Final diagnoses:  Dysuria     Discharge Instructions      Take the antibiotic as directed.  The urine culture is pending.  We will call you if it shows the need to change or discontinue your antibiotic.    Follow up with your primary care provider or urologist.        ED Prescriptions     Medication Sig Dispense Auth. Provider   cephALEXin (KEFLEX) 500 MG capsule Take 1 capsule (500 mg total) by mouth every 8 (eight) hours for 5 days. 15 capsule Corlis Burnard DEL, NP      PDMP not reviewed this encounter.   Corlis Burnard DEL, NP 09/15/23 1921

## 2023-09-15 NOTE — ED Triage Notes (Signed)
 Urinary frequency, burning with urination, lower abdomen and back pain that started today.

## 2023-09-17 ENCOUNTER — Ambulatory Visit (HOSPITAL_COMMUNITY): Payer: Self-pay

## 2023-09-17 LAB — URINE CULTURE: Culture: >=100000 — AB

## 2023-09-17 MED ORDER — AMOXICILLIN-POT CLAVULANATE 875-125 MG PO TABS: 1.0000 | ORAL_TABLET | Freq: Two times a day (BID) | ORAL | 0 refills | Status: AC

## 2023-09-17 NOTE — Telephone Encounter (Signed)
 Discontinue Keflex  and begin Augmentin  1 tablet twice daily for the next 7 days.  Prescription sent to pharmacy on record.

## 2023-09-26 NOTE — Progress Notes (Unsigned)
 09/28/2023 7:53 PM   [name] Shed 1940/03/19 [id number]  Referring provider: Glendia Shad, MD [address] [place],  [place] [address]  Urological history: 1. Mixed incontinence - Myrbetriq  50 mg daily   2. Cystocele - grade III  3. rUTI's - September 15, 2023 - enterobacter cloacae - August 16, 2023 - enterobacter cloacae - May 24, 2023 - enterobacter cloacae - March 28, 2023 - > 3 organisms recovered  - November 30, 2022 - MUF - October 05, 2022 - Klebsiella pneumoniae  4. Renal cyst - CT (08/2022) right renal cyst  No chief complaint on file.  HPI: CAY KATH is a 83 y.o. woman who presents today for ****  Previous records reviewed.   She was seen at [facility] on 09/15/2023 for UTI.       reports that she has never smoked. She has never been exposed to tobacco smoke. She has never used smokeless tobacco. She reports that she does not drink alcohol and does not use drugs.  Lab Results  Component Value Date   WBC 10.1 03/04/2023   HGB 12.7 03/04/2023   HCT 38.3 03/04/2023   MCV 91.1 03/04/2023   PLT 182.0 03/04/2023    Lab Results  Component Value Date   CREATININE 1.86 (H) 07/08/2023    No results found for: PSA  No results found for: TESTOSTERONE  Lab Results  Component Value Date   HGBA1C 5.7 07/08/2023    Urinalysis    Component Value Date/Time   COLORURINE COLORLESS (A) 11/27/2022 1302   APPEARANCEUR Cloudy (A) 05/24/2023 0907   LABSPEC 1.002 (L) 11/27/2022 1302   PHURINE 7.0 11/27/2022 1302   GLUCOSEU Negative 05/24/2023 0907   GLUCOSEU NEGATIVE 07/22/2018 0839   HGBUR NEGATIVE 11/27/2022 1302   BILIRUBINUR negative 09/15/2023 1859   BILIRUBINUR Negative 05/24/2023 0907   KETONESUR small (15) (A) 09/15/2023 1859   KETONESUR NEGATIVE 11/27/2022 1302   PROTEINUR =100 (A) 09/15/2023 1859   PROTEINUR Negative 05/24/2023 0907   PROTEINUR NEGATIVE 11/27/2022 1302   UROBILINOGEN 0.2  09/15/2023 1859   UROBILINOGEN 0.2 07/22/2018 0839   NITRITE Negative 09/15/2023 1859   NITRITE Negative 05/24/2023 0907   NITRITE NEGATIVE 11/27/2022 1302   LEUKOCYTESUR Large (3+) (A) 09/15/2023 1859   LEUKOCYTESUR 2+ (A) 05/24/2023 0907   LEUKOCYTESUR NEGATIVE 11/27/2022 1302    Lab Results  Component Value Date   LABMICR See below: 05/24/2023   WBCUA >30 (A) 05/24/2023   LABEPIT 0-10 05/24/2023   BACTERIA Many (A) 05/24/2023    PMH: Past Medical History:  Diagnosis Date   Cataracts, bilateral    Chronic kidney disease    Followed by Dr. Marcelino   Chronic kidney disease    Colon polyp    Constipation due to slow transit    Diarrhea    Diarrhea    GERD (gastroesophageal reflux disease)    Heart murmur    History of cyst of breast    History of hiatal hernia    Hyperlipidemia    Hypertension    Hypothyroidism    Melanoma of lower leg (HCC) 2013   Shingles 2013   Shingles    Spinal stenosis in cervical region    Dr. Avanell   Subdural hematoma [facility])    Thyroid  disease     Surgical History: Past Surgical History:  Procedure Laterality Date   ABDOMINAL HYSTERECTOMY  1974   ANTERIOR AND POSTERIOR VAGINAL REPAIR     ANTERIOR AND  POSTERIOR VAGINAL REPAIR W/ SACROSPINOUS LIGAMENT SUSPENSION Right    BREAST BIOPSY  1963   Benign per pt's report   BREAST EXCISIONAL BIOPSY Right 1963   neg   COLONOSCOPY     COLONOSCOPY N/A 10/29/2021   Procedure: COLONOSCOPY;  Surgeon: Toledo, Ladell POUR, MD;  Location: [facility] ENDOSCOPY;  Service: Gastroenterology;  Laterality: N/A;   ELECTROMAGNETIC NAVIGATION BROCHOSCOPY Right 01/09/2019   Procedure: ELECTROMAGNETIC NAVIGATION BRONCHOSCOPY;  Surgeon: Tamea Dedra CROME, MD;  Location: [facility] ORS;  Service: Cardiopulmonary;  Laterality: Right;   ESOPHAGOGASTRODUODENOSCOPY     ESOPHAGOGASTRODUODENOSCOPY (EGD) WITH PROPOFOL  N/A 11/10/2016   Procedure: ESOPHAGOGASTRODUODENOSCOPY (EGD) WITH PROPOFOL ;  Surgeon: Gaylyn Gladis PENNER, MD;   Location: [facility] ENDOSCOPY;  Service: Endoscopy;  Laterality: N/A;   ESOPHAGOGASTRODUODENOSCOPY (EGD) WITH PROPOFOL  N/A 09/09/2018   Procedure: ESOPHAGOGASTRODUODENOSCOPY (EGD) WITH PROPOFOL ;  Surgeon: Gaylyn Gladis PENNER, MD;  Location: [facility] ENDOSCOPY;  Service: Endoscopy;  Laterality: N/A;   ESOPHAGOGASTRODUODENOSCOPY (EGD) WITH PROPOFOL  N/A 10/27/2019   Procedure: ESOPHAGOGASTRODUODENOSCOPY (EGD) WITH PROPOFOL ;  Surgeon: Dessa Reyes ORN, MD;  Location: [facility] ENDOSCOPY;  Service: Endoscopy;  Laterality: N/A;   ESOPHAGOGASTRODUODENOSCOPY (EGD) WITH PROPOFOL  N/A 08/09/2020   Procedure: ESOPHAGOGASTRODUODENOSCOPY (EGD) WITH PROPOFOL ;  Surgeon: Maryruth Ole DASEN, MD;  Location: [facility] ENDOSCOPY;  Service: Endoscopy;  Laterality: N/A;   LUNG BIOPSY  2007   Benign per pt's report   LUNG BIOPSY Right    OOPHORECTOMY  1970   PERINEOPLASTY  12/17/2013   sling for stress incontinence  12/18/2011    Home Medications:  Allergies as of 09/28/2023       Reactions   Quinapril  Other (See Comments)   Hyperkalemia   Codeine Rash        Medication List        Accurate as of September 26, 2023  7:53 PM. If you have any questions, ask your nurse or doctor.          Alpha-Lipoic Acid 200 MG Tabs Take 200 mg by mouth 3 (three) times daily.   amLODipine  5 MG tablet Commonly known as: NORVASC  Take 1 tablet (5 mg total) by mouth daily.   aspirin  81 MG tablet Take 1 tablet (81 mg total) by mouth daily.   atorvastatin  20 MG tablet Commonly known as: LIPITOR Take 1 tablet (20 mg total) by mouth daily.   BENEFIBER PO Take by mouth.   chlorthalidone 25 MG tablet Commonly known as: HYGROTON Take 12.5 mg by mouth every morning.   Cholecalciferol  25 MCG (1000 UT) tablet Take 1,000 Units by mouth daily.   Fish Oil  1000 MG Caps Take 1,000 mg by mouth daily.   gabapentin  100 MG capsule Commonly known as: NEURONTIN  Take 2 capsules q hs   Gemtesa  75 MG Tabs Generic drug: Vibegron  Take 1  tablet (75 mg total) by mouth daily.   levothyroxine  75 MCG tablet Commonly known as: SYNTHROID  TAKE 1 TABLET BY MOUTH ONCE DAILY IN THE MORNING BEFORE BREAKFAST   mirabegron  ER 50 MG Tb24 tablet Commonly known as: MYRBETRIQ  Take 1 tablet (50 mg total) by mouth daily.   MIRALAX PO Take by mouth as needed.   multivitamin tablet Take 1 tablet by mouth daily.   pantoprazole  40 MG tablet Commonly known as: PROTONIX  Take 1 tablet (40 mg total) by mouth daily. Take 30 minutes before breakfast   STOOL SOFTENER PO Take by mouth.   trimethoprim  100 MG tablet Commonly known as: TRIMPEX  Take 1 tablet (100 mg total) by mouth daily.  Allergies:  Allergies  Allergen Reactions   Quinapril  Other (See Comments)    Hyperkalemia   Codeine Rash    Family History: Family History  Problem Relation Age of Onset   Arthritis Mother    Liver disease Mother    Cirrhosis Mother    Liver cancer Mother    Heart disease Father    Heart attack Father    Coronary artery disease Father    Depression Brother    Prostate cancer Brother    Diabetes Maternal Aunt    Breast cancer Cousin        maternal cousins   Liver cancer Cousin    Breast cancer Other     Social History: See HPI for pertinent social history  ROS: Pertinent ROS in HPI  Physical Exam: There were no vitals taken for this visit.  Constitutional:  Well nourished. Alert and oriented, No acute distress. HEENT: [place] AT, moist mucus membranes.  Trachea midline, no masses. Cardiovascular: No clubbing, cyanosis, or edema. Respiratory: Normal respiratory effort, no increased work of breathing. GI: Abdomen is soft, non tender, non distended, no abdominal masses. Liver and spleen not palpable.  No hernias appreciated.  Stool sample for occult testing is not indicated.   GU: No CVA tenderness.  No bladder fullness or masses.  Patient with circumcised/uncircumcised phallus. ***Foreskin easily retracted***  Urethral meatus is  patent.  No penile discharge. No penile lesions or rashes. Scrotum without lesions, cysts, rashes and/or edema.  Testicles are located scrotally bilaterally. No masses are appreciated in the testicles. Left and right epididymis are normal. Rectal: Patient with  normal sphincter tone. Anus and perineum without scarring or rashes. No rectal masses are appreciated. Prostate is approximately *** grams, *** nodules are appreciated. Seminal vesicles are normal. Skin: No rashes, bruises or suspicious lesions. Lymph: No cervical or inguinal adenopathy. Neurologic: Grossly intact, no focal deficits, moving all 4 extremities. Psychiatric: Normal mood and affect.  Laboratory Data: See EPIC and HPI  I have reviewed the labs.   Pertinent Imaging: @CT @ @ultrasound @ @KUB @ I have independently reviewed the films.    Assessment & Plan:  ***  There are no diagnoses linked to this encounter.  No follow-ups on file.  These notes generated with voice recognition software. I apologize for typographical errors.  CLOTILDA HELON RIGGERS  [facility] [address]  [address] [place], [place] [address] [phone]

## 2023-09-28 ENCOUNTER — Encounter: Payer: Self-pay | Admitting: Urology

## 2023-09-28 ENCOUNTER — Ambulatory Visit: Admitting: Urology

## 2023-09-28 VITALS — BP 134/79 | HR 73 | Ht 64.0 in | Wt 144.0 lb

## 2023-09-28 DIAGNOSIS — N3946 Mixed incontinence: Secondary | ICD-10-CM | POA: Diagnosis not present

## 2023-09-28 DIAGNOSIS — N390 Urinary tract infection, site not specified: Secondary | ICD-10-CM

## 2023-09-28 LAB — URINALYSIS, COMPLETE
Bilirubin, UA: NEGATIVE
Glucose, UA: NEGATIVE
Ketones, UA: NEGATIVE
Nitrite, UA: NEGATIVE
Protein,UA: NEGATIVE
Specific Gravity, UA: <1.005 — ABNORMAL LOW (ref 1.005–1.030)
Urobilinogen, Ur: 0.2 mg/dL (ref 0.2–1.0)
pH, UA: 6 (ref 5.0–7.5)

## 2023-09-28 LAB — MICROSCOPIC EXAMINATION: WBC, UA: >30 /HPF — AB (ref 0–5)

## 2023-09-28 LAB — BLADDER SCAN AMB NON-IMAGING

## 2023-09-29 ENCOUNTER — Telehealth: Payer: Self-pay

## 2023-09-29 ENCOUNTER — Other Ambulatory Visit: Payer: Self-pay | Admitting: Urology

## 2023-09-29 MED ORDER — CEPHALEXIN 500 MG PO CAPS: 500.0000 mg | ORAL_CAPSULE | Freq: Two times a day (BID) | ORAL | 0 refills | Status: DC

## 2023-09-29 NOTE — Telephone Encounter (Signed)
 pt called the triage line stating she saw [facility] yesterday in clinic. Pt states around 7:30 pm she started having urinary frequency and pain with urination. Pt states she took a keflex  last night and one this morning, she had some left over from a previous urgent care visit.  Pt states she feels better after taking medication and just wanted to let [facility] know.

## 2023-09-29 NOTE — Telephone Encounter (Signed)
 Called patient and no answer, left a message letting her know that Keflex  was sent into [facility]

## 2023-10-02 LAB — CULTURE, URINE COMPREHENSIVE

## 2023-10-03 ENCOUNTER — Ambulatory Visit: Payer: Self-pay | Admitting: Urology

## 2023-10-03 DIAGNOSIS — N390 Urinary tract infection, site not specified: Secondary | ICD-10-CM

## 2023-10-03 MED ORDER — NITROFURANTOIN MONOHYD MACRO 100 MG PO CAPS: 100.0000 mg | ORAL_CAPSULE | Freq: Two times a day (BID) | ORAL | 0 refills | Status: DC

## 2023-10-08 ENCOUNTER — Other Ambulatory Visit: Payer: Self-pay | Admitting: Internal Medicine

## 2023-10-08 DIAGNOSIS — Z1231 Encounter for screening mammogram for malignant neoplasm of breast: Secondary | ICD-10-CM

## 2023-10-11 ENCOUNTER — Other Ambulatory Visit (INDEPENDENT_AMBULATORY_CARE_PROVIDER_SITE_OTHER)

## 2023-10-11 DIAGNOSIS — R739 Hyperglycemia, unspecified: Secondary | ICD-10-CM

## 2023-10-11 DIAGNOSIS — I10 Essential (primary) hypertension: Secondary | ICD-10-CM

## 2023-10-11 DIAGNOSIS — I4891 Unspecified atrial fibrillation: Secondary | ICD-10-CM

## 2023-10-11 DIAGNOSIS — E7800 Pure hypercholesterolemia, unspecified: Secondary | ICD-10-CM | POA: Diagnosis not present

## 2023-10-11 LAB — HEPATIC FUNCTION PANEL
ALT: 16 U/L (ref 0–35)
AST: 23 U/L (ref 0–37)
Albumin: 3.9 g/dL (ref 3.5–5.2)
Alkaline Phosphatase: 132 U/L — ABNORMAL HIGH (ref 39–117)
Bilirubin, Direct: 0.2 mg/dL (ref 0.0–0.3)
Total Bilirubin: 0.8 mg/dL (ref 0.2–1.2)
Total Protein: 6.7 g/dL (ref 6.0–8.3)

## 2023-10-11 LAB — BASIC METABOLIC PANEL WITH GFR
BUN: 28 mg/dL — ABNORMAL HIGH (ref 6–23)
CO2: 29 meq/L (ref 19–32)
Calcium: 9.6 mg/dL (ref 8.4–10.5)
Chloride: 102 meq/L (ref 96–112)
Creatinine, Ser: 1.59 mg/dL — ABNORMAL HIGH (ref 0.40–1.20)
GFR: 30.08 mL/min — ABNORMAL LOW (ref >60.00)
Glucose, Bld: 98 mg/dL (ref 70–99)
Potassium: 4.3 meq/L (ref 3.5–5.1)
Sodium: 140 meq/L (ref 135–145)

## 2023-10-11 LAB — LIPID PANEL
Cholesterol: 144 mg/dL (ref 0–200)
HDL: 42.5 mg/dL (ref >39.00)
LDL Cholesterol: 70 mg/dL (ref 0–99)
NonHDL: 101.73
Total CHOL/HDL Ratio: 3
Triglycerides: 161 mg/dL — ABNORMAL HIGH (ref 0.0–149.0)
VLDL: 32.2 mg/dL (ref 0.0–40.0)

## 2023-10-11 LAB — HEMOGLOBIN A1C: Hgb A1c MFr Bld: 5.8 % (ref 4.6–6.5)

## 2023-10-12 ENCOUNTER — Ambulatory Visit: Payer: Self-pay | Admitting: Internal Medicine

## 2023-10-12 LAB — TSH: TSH: 5.07 u[IU]/mL (ref 0.35–5.50)

## 2023-10-13 ENCOUNTER — Ambulatory Visit: Admitting: General Practice

## 2023-10-13 ENCOUNTER — Ambulatory Visit: Admitting: Internal Medicine

## 2023-10-13 ENCOUNTER — Ambulatory Visit
Admission: RE | Admit: 2023-10-13 | Discharge: 2023-10-13 | Disposition: A | Discharge status: Home / Self Care | Attending: Internal Medicine | Admitting: Internal Medicine

## 2023-10-13 ENCOUNTER — Encounter: Payer: Self-pay | Admitting: Internal Medicine

## 2023-10-13 ENCOUNTER — Encounter
Admission: RE | Disposition: A | Discharge status: Home / Self Care | Payer: Self-pay | Source: Home / Self Care | Attending: Internal Medicine

## 2023-10-13 DIAGNOSIS — K317 Polyp of stomach and duodenum: Secondary | ICD-10-CM | POA: Diagnosis not present

## 2023-10-13 DIAGNOSIS — K760 Fatty (change of) liver, not elsewhere classified: Secondary | ICD-10-CM | POA: Insufficient documentation

## 2023-10-13 DIAGNOSIS — E1122 Type 2 diabetes mellitus with diabetic chronic kidney disease: Secondary | ICD-10-CM | POA: Diagnosis not present

## 2023-10-13 DIAGNOSIS — N189 Chronic kidney disease, unspecified: Secondary | ICD-10-CM | POA: Diagnosis not present

## 2023-10-13 DIAGNOSIS — K5900 Constipation, unspecified: Secondary | ICD-10-CM | POA: Insufficient documentation

## 2023-10-13 DIAGNOSIS — Z79890 Hormone replacement therapy (postmenopausal): Secondary | ICD-10-CM | POA: Insufficient documentation

## 2023-10-13 DIAGNOSIS — I4891 Unspecified atrial fibrillation: Secondary | ICD-10-CM | POA: Diagnosis not present

## 2023-10-13 DIAGNOSIS — K2950 Unspecified chronic gastritis without bleeding: Secondary | ICD-10-CM | POA: Diagnosis not present

## 2023-10-13 DIAGNOSIS — R1013 Epigastric pain: Secondary | ICD-10-CM | POA: Insufficient documentation

## 2023-10-13 DIAGNOSIS — I129 Hypertensive chronic kidney disease with stage 1 through stage 4 chronic kidney disease, or unspecified chronic kidney disease: Secondary | ICD-10-CM | POA: Diagnosis not present

## 2023-10-13 DIAGNOSIS — Z79899 Other long term (current) drug therapy: Secondary | ICD-10-CM | POA: Diagnosis not present

## 2023-10-13 DIAGNOSIS — K449 Diaphragmatic hernia without obstruction or gangrene: Secondary | ICD-10-CM | POA: Insufficient documentation

## 2023-10-13 DIAGNOSIS — K2970 Gastritis, unspecified, without bleeding: Secondary | ICD-10-CM | POA: Diagnosis not present

## 2023-10-13 DIAGNOSIS — E039 Hypothyroidism, unspecified: Secondary | ICD-10-CM | POA: Insufficient documentation

## 2023-10-13 DIAGNOSIS — K219 Gastro-esophageal reflux disease without esophagitis: Secondary | ICD-10-CM | POA: Insufficient documentation

## 2023-10-13 HISTORY — PX: ESOPHAGOGASTRODUODENOSCOPY: SHX5428

## 2023-10-13 SURGERY — EGD (ESOPHAGOGASTRODUODENOSCOPY)
Anesthesia: General

## 2023-10-13 MED ORDER — LIDOCAINE HCL (PF) 2 % IJ SOLN
INTRAMUSCULAR | Status: AC
  Filled 2023-10-13: qty 10

## 2023-10-13 MED ORDER — SODIUM CHLORIDE 0.9 % IV SOLN: INTRAVENOUS | Status: DC

## 2023-10-13 MED ORDER — LIDOCAINE HCL (CARDIAC) PF 100 MG/5ML IV SOSY
PREFILLED_SYRINGE | INTRAVENOUS | Status: DC | PRN
  Administered 2023-10-13: 60 mg via INTRAVENOUS

## 2023-10-13 MED ORDER — ONDANSETRON HCL 4 MG/2ML IJ SOLN
INTRAMUSCULAR | Status: AC
Start: 2023-10-13 — End: 2023-10-13
  Filled 2023-10-13: qty 2

## 2023-10-13 MED ORDER — PROPOFOL 1000 MG/100ML IV EMUL
INTRAVENOUS | Status: AC
  Filled 2023-10-13: qty 100

## 2023-10-13 MED ORDER — PROPOFOL 500 MG/50ML IV EMUL
INTRAVENOUS | Status: DC | PRN
Start: 2023-10-13 — End: 2023-10-13
  Administered 2023-10-13: 50 ug/kg/min via INTRAVENOUS

## 2023-10-13 MED ORDER — PROPOFOL 10 MG/ML IV BOLUS
INTRAVENOUS | Status: DC | PRN
  Administered 2023-10-13: 40 mg via INTRAVENOUS
  Administered 2023-10-13: 20 mg via INTRAVENOUS

## 2023-10-13 NOTE — Anesthesia Postprocedure Evaluation (Signed)
 Anesthesia Post Note  Patient: Caitlin Lin  Procedure(s) Performed: EGD (ESOPHAGOGASTRODUODENOSCOPY)  Patient location during evaluation: Endoscopy Anesthesia Type: General Level of consciousness: awake and alert Pain management: pain level controlled Vital Signs Assessment: post-procedure vital signs reviewed and stable Respiratory status: spontaneous breathing, nonlabored ventilation, respiratory function stable and patient connected to nasal cannula oxygen Cardiovascular status: blood pressure returned to baseline and stable Postop Assessment: no apparent nausea or vomiting Anesthetic complications: no   No notable events documented.   Last Vitals:  Vitals:   10/13/23 1200 10/13/23 1216  BP: 129/69 126/63  Pulse:    Resp:    Temp:    SpO2:      Last Pain:  Vitals:   10/13/23 1200  TempSrc:   PainSc: 0-No pain                 Debby Mines

## 2023-10-13 NOTE — Anesthesia Preprocedure Evaluation (Signed)
 Anesthesia Evaluation  Patient identified by MRN, date of birth, ID band Patient awake    Reviewed: Allergy & Precautions, NPO status , Patient's Chart, lab work & pertinent test results  History of Anesthesia Complications Negative for: history of anesthetic complications  Airway Mallampati: II  TM Distance: >3 FB Neck ROM: Full    Dental no notable dental hx.    Pulmonary neg pulmonary ROS, neg sleep apnea, neg COPD   breath sounds clear to auscultation- rhonchi (-) wheezing      Cardiovascular Exercise Tolerance: Good hypertension, Pt. on medications +CHF  (-) CAD, (-) Past MI, (-) Cardiac Stents and (-) CABG + Valvular Problems/Murmurs AI  Rhythm:Regular Rate:Normal - Systolic murmurs and - Diastolic murmurs Aortic regurgitation  ECHO - aortic regurgitation.  Saw cardiology.  Coronary artery calcifications on noncontrast chest CT. Previous echocardiogram with normal systolic and diastolic function.Lexiscan  Myoview  03/2020 with no significant ischemia, low risk. Continue aspirin , statin       Neuro/Psych  Headaches, neg Seizures Weakness in both legs   negative psych ROS   GI/Hepatic hiatal hernia,GERD  ,,Fatty Liver   Endo/Other  neg diabetesHypothyroidism    Renal/GU Renal disease     Musculoskeletal   Abdominal   Peds  Hematology  (+) Blood dyscrasia, anemia   Anesthesia Other Findings Cataracts, bilateral Chronic kidney disease     Comment:  Followed by Dr. Marcelino Chronic kidney disease Colon polyp Constipation due to slow transit GERD (gastroesophageal reflux disease) Heart murmur History of cyst of breast History of hiatal hernia Hyperlipidemia Hypertension Hypothyroidism Melanoma of lower leg (HCC) Shingles Spinal stenosis in cervical region     Comment:  Dr. Avanell Subdural hematoma [facility]) Thyroid  disease   Reproductive/Obstetrics                               Anesthesia Physical Anesthesia Plan  ASA: 3  Anesthesia Plan: General   Post-op Pain Management: Minimal or no pain anticipated   Induction: Intravenous  PONV Risk Score and Plan: 2 and Propofol  infusion and TIVA  Airway Management Planned: Nasal Cannula  Additional Equipment: None  Intra-op Plan:   Post-operative Plan:   Informed Consent: I have reviewed the patients History and Physical, chart, labs and discussed the procedure including the risks, benefits and alternatives for the proposed anesthesia with the patient or authorized representative who has indicated his/her understanding and acceptance.     Dental advisory given  Plan Discussed with: CRNA and Surgeon  Anesthesia Plan Comments: (Discussed risks of anesthesia with patient, including possibility of difficulty with spontaneous ventilation under anesthesia necessitating airway intervention, PONV, and rare risks such as cardiac or respiratory or neurological events, and allergic reactions. Discussed the role of CRNA in patient's perioperative care. Patient understands.)        Anesthesia Quick Evaluation

## 2023-10-13 NOTE — Op Note (Signed)
 [facility] Patient Name: Caitlin Lin Procedure Date: 10/13/2023 11:02 AM MRN: [id number] Account #: [id number] Date of Birth: 1941-02-16 Admit Type: Outpatient Age: 83 Room: [facility] ENDO ROOM 3 Gender: Female Note Status: Finalized Instrument Name: Upper Endoscope [id number] Procedure:             Upper GI endoscopy Indications:           Dyspepsia Providers:             Aleasha Fregeau K. Kymia Simi MD, MD Medicines:             Propofol  per Anesthesia Complications:         No immediate complications. Estimated blood loss:                         Minimal. Procedure:             Pre-Anesthesia Assessment:                        - The risks and benefits of the procedure and the                         sedation options and risks were discussed with the                         patient. All questions were answered and informed                         consent was obtained.                        - Patient identification and proposed procedure were                         verified prior to the procedure by the nurse. The                         procedure was verified in the procedure room.                        - ASA Grade Assessment: III - A patient with severe                         systemic disease.                        - After reviewing the risks and benefits, the patient                         was deemed in satisfactory condition to undergo the                         procedure.                        After obtaining informed consent, the endoscope was                         passed under direct vision. Throughout the procedure,  the patient's blood pressure, pulse, and oxygen                         saturations were monitored continuously. The Endoscope                         was introduced through the mouth, and advanced to the                         third part of duodenum. The upper GI endoscopy was                          accomplished without difficulty. The patient tolerated                         the procedure well. Findings:      The esophagus was normal.      Patchy minimal inflammation characterized by erythema was found in the       entire examined stomach. Biopsies were taken with a cold forceps for       Helicobacter pylori testing. Estimated blood loss was minimal.      Multiple small sessile polyps with no bleeding and no stigmata of recent       bleeding were found in the gastric body. Biopsies were taken with a cold       forceps for histology. Estimated blood loss was minimal.      A 2 cm hiatal hernia was present.      The examined duodenum was normal. Impression:            - Normal esophagus.                        - Gastritis. Biopsied.                        - Multiple gastric polyps. Biopsied.                        - 2 cm hiatal hernia.                        - Normal examined duodenum. Recommendation:        - Patient has a contact number available for                         emergencies. The signs and symptoms of potential                         delayed complications were discussed with the patient.                         Return to normal activities tomorrow. Written                         discharge instructions were provided to the patient.                        - Resume previous diet.                        -  Continue present medications.                        - Await pathology results.                        - Follow up with Romero Antigua, PA-C at [facility]. [phone].                        - Telephone GI office to schedule appointment in 3                         months.                        - The findings and recommendations were discussed with                         the patient. Procedure Code(s):     --- Professional ---                        [phone], Esophagogastroduodenoscopy, flexible,                          transoral; with biopsy, single or multiple Diagnosis Code(s):     --- Professional ---                        R10.13, Epigastric pain                        K44.9, Diaphragmatic hernia without obstruction or                         gangrene                        K31.7, Polyp of stomach and duodenum                        K29.70, Gastritis, unspecified, without bleeding CPT copyright 2022 American Medical Association. All rights reserved. The codes documented in this report are preliminary and upon coder review may  be revised to meet current compliance requirements. Ladell MARLA Boss MD, MD 10/13/2023 12:01:09 PM This report has been signed electronically. Number of Addenda: 0 Note Initiated On: 10/13/2023 11:02 AM Estimated Blood Loss:  Estimated blood loss was minimal.      [facility]

## 2023-10-13 NOTE — Transfer of Care (Signed)
 Immediate Anesthesia Transfer of Care Note  Patient: Caitlin Lin  Procedure(s) Performed: EGD (ESOPHAGOGASTRODUODENOSCOPY)  Patient Location: PACU  Anesthesia Type:General  Level of Consciousness: sedated  Airway & Oxygen Therapy: Patient Spontanous Breathing  Post-op Assessment: Report given to RN and Post -op Vital signs reviewed and stable  Post vital signs: Reviewed and stable  Last Vitals:  Vitals Value Taken Time  BP    Temp    Pulse 64 10/13/23 11:58  Resp 18 10/13/23 11:58  SpO2 99 % 10/13/23 11:58  Vitals shown include unfiled device data.  Last Pain:  Vitals:   10/13/23 1039  TempSrc: Temporal         Complications: No notable events documented.

## 2023-10-13 NOTE — Interval H&P Note (Signed)
 History and Physical Interval Note:  10/13/2023 11:34 AM  Caitlin Lin  has presented today for surgery, with the diagnosis of Dyspepsia [R10.13].  The various methods of treatment have been discussed with the patient and family. After consideration of risks, benefits and other options for treatment, the patient has consented to  Procedure(s): EGD (ESOPHAGOGASTRODUODENOSCOPY) (N/A) as a surgical intervention.  The patient's history has been reviewed, patient examined, no change in status, stable for surgery.  I have reviewed the patient's chart and labs.  Questions were answered to the patient's satisfaction.     [place], Jizelle Conkey

## 2023-10-13 NOTE — H&P (Signed)
 Outpatient short stay form Pre-procedure 10/13/2023 11:08 AM Caitlin Lin K. Caitlin Lin, M.D.  Primary Physician: Allena Hamilton, M.D.  Reason for visit:  Dyspepsia  History of present illness:  Caitlin Lin reports acutely 3 nights ago developing issues with abdominal pain, nausea, and feeling she needed to vomit but was not able to. Ultimately after a few hours she began having diarrhea and abdominal pain improved some. She has no longer having nausea or vomiting but she has continued to have diarrhea. She had 5 stools so far today. No nocturnal diarrhea. Has had very limited diet since symptoms began 3 days ago, mainly eating scrambled egg, chicken pie, etc. she has some mild abdominal discomfort that improves with bowel movement. Stools are liquid brown and denies any blood in stool. She reports acute symptoms are clear change from her baseline as her normal bowel habits are mild constipation.  She endorses having some improvement with pantoprazole  40 mg twice daily. She has needed Tums less than she was seen initially in October 2024. She does still feel the foods get to her epigastric area and do not digest well. She denies specific esophageal dysphagia. No chronic n/v.   She feels baseline bowel habits are constipation, tried Benefiber and caused gas. Metamcuil helps but can make stools too loose. Does take stool softener nightly. Sometimes gets rectal irritation from straining and uses Preparation H as needed.     Current Facility-Administered Medications:    0.9 %  sodium chloride  infusion, , Intravenous, Continuous, [place], Deamonte Sayegh K, MD, Last Rate: 20 mL/hr at 10/13/23 1052, New Bag at 10/13/23 1052  Medications Prior to Admission  Medication Sig Dispense Refill Last Dose/Taking   amLODipine  (NORVASC ) 5 MG tablet Take 1 tablet (5 mg total) by mouth daily. 90 tablet 1 10/13/2023   aspirin  81 MG tablet Take 1 tablet (81 mg total) by mouth daily. 30 tablet 3 10/12/2023   atorvastatin  (LIPITOR) 20  MG tablet Take 1 tablet (20 mg total) by mouth daily. 90 tablet 3 10/12/2023   chlorthalidone (HYGROTON) 25 MG tablet Take 12.5 mg by mouth every morning.   10/12/2023   Cholecalciferol  25 MCG (1000 UT) tablet Take 1,000 Units by mouth daily.   Past Week   gabapentin  (NEURONTIN ) 100 MG capsule Take 2 capsules q hs 60 capsule 3 10/12/2023   levothyroxine  (SYNTHROID ) 75 MCG tablet TAKE 1 TABLET BY MOUTH ONCE DAILY IN THE MORNING BEFORE BREAKFAST 90 tablet 3 10/12/2023   pantoprazole  (PROTONIX ) 40 MG tablet Take 1 tablet (40 mg total) by mouth daily. Take 30 minutes before breakfast 90 tablet 3 10/12/2023   Alpha-Lipoic Acid 200 MG TABS Take 200 mg by mouth 3 (three) times daily.      Docusate Calcium  (STOOL SOFTENER PO) Take by mouth.      mirabegron  ER (MYRBETRIQ ) 50 MG TB24 tablet Take 1 tablet (50 mg total) by mouth daily. 30 tablet 11    Multiple Vitamin (MULTIVITAMIN) tablet Take 1 tablet by mouth daily.      nitrofurantoin , macrocrystal-monohydrate, (MACROBID ) 100 MG capsule Take 1 capsule (100 mg total) by mouth every 12 (twelve) hours. 14 capsule 0    Omega-3 Fatty Acids (FISH OIL ) 1000 MG CAPS Take 1,000 mg by mouth daily.      Polyethylene Glycol 3350 (MIRALAX PO) Take by mouth as needed.      Vibegron  (GEMTESA ) 75 MG TABS Take 1 tablet (75 mg total) by mouth daily. (Patient not taking: Reported on 10/13/2023)   Not Taking   Wheat  Dextrin (BENEFIBER PO) Take by mouth.        Allergies  Allergen Reactions   Quinapril  Other (See Comments)    Hyperkalemia   Augmentin  [Amoxicillin -Pot Clavulanate] Other (See Comments)    GI upset   Myrbetriq  [Mirabegron ] Other (See Comments)    GI upset    Codeine Rash     Past Medical History:  Diagnosis Date   Cataracts, bilateral    Chronic kidney disease    Followed by Dr. Marcelino   Chronic kidney disease    Colon polyp    Constipation due to slow transit    Diarrhea    Diarrhea    GERD (gastroesophageal reflux disease)    Heart murmur     History of cyst of breast    History of hiatal hernia    Hyperlipidemia    Hypertension    Hypothyroidism    Melanoma of lower leg (HCC) 2013   Shingles 2013   Shingles    Spinal stenosis in cervical region    Dr. Avanell   Subdural hematoma [facility])    Thyroid  disease     Review of systems:  Otherwise negative.    Physical Exam  Gen: Alert, oriented. Appears stated age.  HEENT: [place]/AT. PERRLA. Lungs: CTA, no wheezes. CV: RR nl S1, S2. Abd: soft, benign, no masses. BS+ Ext: No edema. Pulses 2+    Planned procedures: Proceed with EGD. The patient understands the nature of the planned procedure, indications, risks, alternatives and potential complications including but not limited to bleeding, infection, perforation, damage to internal organs and possible oversedation/side effects from anesthesia. The patient agrees and gives consent to proceed.  Please refer to procedure notes for findings, recommendations and patient disposition/instructions.     Andric Kerce K. Caitlin Lin, M.D. Gastroenterology 10/13/2023  11:08 AM

## 2023-10-14 LAB — SURGICAL PATHOLOGY

## 2023-10-15 ENCOUNTER — Ambulatory Visit (INDEPENDENT_AMBULATORY_CARE_PROVIDER_SITE_OTHER): Admitting: Internal Medicine

## 2023-10-15 VITALS — BP 130/74 | HR 79 | Resp 16 | Ht 64.0 in | Wt 146.6 lb

## 2023-10-15 DIAGNOSIS — N184 Chronic kidney disease, stage 4 (severe): Secondary | ICD-10-CM

## 2023-10-15 DIAGNOSIS — D696 Thrombocytopenia, unspecified: Secondary | ICD-10-CM | POA: Diagnosis not present

## 2023-10-15 DIAGNOSIS — K219 Gastro-esophageal reflux disease without esophagitis: Secondary | ICD-10-CM | POA: Diagnosis not present

## 2023-10-15 DIAGNOSIS — R739 Hyperglycemia, unspecified: Secondary | ICD-10-CM | POA: Diagnosis not present

## 2023-10-15 DIAGNOSIS — R748 Abnormal levels of other serum enzymes: Secondary | ICD-10-CM

## 2023-10-15 DIAGNOSIS — I4891 Unspecified atrial fibrillation: Secondary | ICD-10-CM | POA: Diagnosis not present

## 2023-10-15 DIAGNOSIS — N390 Urinary tract infection, site not specified: Secondary | ICD-10-CM

## 2023-10-15 DIAGNOSIS — E7800 Pure hypercholesterolemia, unspecified: Secondary | ICD-10-CM

## 2023-10-15 DIAGNOSIS — I351 Nonrheumatic aortic (valve) insufficiency: Secondary | ICD-10-CM | POA: Diagnosis not present

## 2023-10-15 DIAGNOSIS — E039 Hypothyroidism, unspecified: Secondary | ICD-10-CM

## 2023-10-15 DIAGNOSIS — I10 Essential (primary) hypertension: Secondary | ICD-10-CM | POA: Diagnosis not present

## 2023-10-15 DIAGNOSIS — I700 Atherosclerosis of aorta: Secondary | ICD-10-CM | POA: Diagnosis not present

## 2023-10-15 NOTE — Progress Notes (Signed)
 Subjective:    Patient ID: Caitlin Lin, female    DOB: [birth year], 83 y.o.   MRN: [id number]  Patient here for  Chief Complaint  Patient presents with   Medical Management of Chronic Issues    HPI Here for a scheduled follow up - follow up regarding hypercholesterolemia, hypertension, GERD and sleep issues. Saw urology 05/24/23. Trial of myrbetriq . This caused stomach upset.  Reevaluated 09/28/23. Follow up - UTI. Has had issues with recurring infections. Was taking trimethoprim  for prophylaxis. During her 09/28/23 visit, she was instructed to stop trimethoprim . Start cameroon. Also, urine checked to confirm no infection. Urine revealed infection. Treated with macrobid . No urinary symptoms now. Had f/u with nephrology 06/14/23. No changes made. Had f/u with cardiology 07/14/13 - plan f/u echo. Stable. Saw ortho 08/03/23 - s/p intra-articular knee joint injection. Follow up with GI 09/09/23 - recommended EGD. Evaluated for diarrhea, dyspepsia and constipation. EGD 10/13/23 - gastritis, multiple gastric polyps, 2cm hiatal hernia. Reports she is doing relatively well. Breathing stable. Fatigue is better. No abdominal pain or cramping. Had questions about supplements taking    Past Medical History:  Diagnosis Date   Cataracts, bilateral    Chronic kidney disease    Followed by Dr. Marcelino   Chronic kidney disease    Colon polyp    Constipation due to slow transit    Diarrhea    Diarrhea    GERD (gastroesophageal reflux disease)    Heart murmur    History of cyst of breast    History of hiatal hernia    Hyperlipidemia    Hypertension    Hypothyroidism    Melanoma of lower leg (HCC) 2013   Shingles 2013   Shingles    Spinal stenosis in cervical region    Dr. Avanell   Subdural hematoma [facility])    Thyroid  disease    Past Surgical History:  Procedure Laterality Date   ABDOMINAL HYSTERECTOMY  1974   ANTERIOR AND POSTERIOR VAGINAL REPAIR     ANTERIOR AND POSTERIOR VAGINAL REPAIR W/  SACROSPINOUS LIGAMENT SUSPENSION Right    BREAST BIOPSY  1963   Benign per pt's report   BREAST EXCISIONAL BIOPSY Right 1963   neg   COLONOSCOPY     COLONOSCOPY N/A 10/29/2021   Procedure: COLONOSCOPY;  Surgeon: Toledo, Ladell POUR, MD;  Location: [facility] ENDOSCOPY;  Service: Gastroenterology;  Laterality: N/A;   ELECTROMAGNETIC NAVIGATION BROCHOSCOPY Right 01/09/2019   Procedure: ELECTROMAGNETIC NAVIGATION BRONCHOSCOPY;  Surgeon: Tamea Dedra CROME, MD;  Location: [facility] ORS;  Service: Cardiopulmonary;  Laterality: Right;   ESOPHAGOGASTRODUODENOSCOPY     ESOPHAGOGASTRODUODENOSCOPY N/A 10/13/2023   Procedure: EGD (ESOPHAGOGASTRODUODENOSCOPY);  Surgeon: Toledo, Ladell POUR, MD;  Location: [facility] ENDOSCOPY;  Service: Gastroenterology;  Laterality: N/A;   ESOPHAGOGASTRODUODENOSCOPY (EGD) WITH PROPOFOL  N/A 11/10/2016   Procedure: ESOPHAGOGASTRODUODENOSCOPY (EGD) WITH PROPOFOL ;  Surgeon: Gaylyn Gladis PENNER, MD;  Location: [facility] ENDOSCOPY;  Service: Endoscopy;  Laterality: N/A;   ESOPHAGOGASTRODUODENOSCOPY (EGD) WITH PROPOFOL  N/A 09/09/2018   Procedure: ESOPHAGOGASTRODUODENOSCOPY (EGD) WITH PROPOFOL ;  Surgeon: Gaylyn Gladis PENNER, MD;  Location[facility]COPY;  Service: Endoscopy;  Laterality: N/A;   ESOPHAGOGASTRODUODENOSCOPY (EGD) WITH PROPOFOL  N/A 10/27/2019   Procedure: ESOPHAGOGASTRODUODENOSCOPY (EGD) WITH PROPOFOL ;  Surgeon: Dessa Reyes ORN, MD;  Location: [facility] ENDOSCOPY;  Service: Endoscopy;  Laterality: N/A;   ESOPHAGOGASTRODUODENOSCOPY (EGD) WITH PROPOFOL  N/A 08/09/2020   Procedure: ESOPHAGOGASTRODUODENOSCOPY (EGD) WITH PROPOFOL ;  Surgeon: Maryruth Ole DASEN, MD;  Location: [facility] ENDOSCOPY;  Service: Endoscopy;  Laterality: N/A;   LUNG BIOPSY  2007  Benign per pt's report   LUNG BIOPSY Right    OOPHORECTOMY  1970   PERINEOPLASTY  12/17/2013   sling for stress incontinence  12/18/2011   Family History  Problem Relation Age of Onset   Arthritis Mother    Liver disease Mother    Cirrhosis  Mother    Liver cancer Mother    Heart disease Father    Heart attack Father    Coronary artery disease Father    Depression Brother    Prostate cancer Brother    Diabetes Maternal Aunt    Breast cancer Cousin        maternal cousins   Liver cancer Cousin    Breast cancer Other    Social History   Socioeconomic History   Marital status: Married    Spouse name: Not on file   Number of children: 2   Years of education: Not on file   Highest education level: Not on file  Occupational History   Occupation: Retired  Tobacco Use   Smoking status: Never    Passive exposure: Never   Smokeless tobacco: Never  Vaping Use   Vaping status: Never Used  Substance and Sexual Activity   Alcohol use: No   Drug use: No   Sexual activity: Yes  Other Topics Concern   Not on file  Social History Narrative   Lives in [place] with husband. Has 2 children boy and girl, 4 grandchildren. Retired Insurance underwriter.       Daily Caffeine Use:  NO   Regular Exercise -  Not at this time due to back pain, would like to resume soon   Social Drivers of Corporate investment banker Strain: Low Risk  (08/03/2023)   Received from [facility]   Overall Financial Resource Strain (CARDIA)    Difficulty of Paying Living Expenses: Not hard at all  Food Insecurity: No Food Insecurity (08/03/2023)   [facility] from [facility]   Hunger Vital Sign    Within the past 12 months, you worried that your food would run out before you got the money to buy more.: Never true    Within the past 12 months, the food you bought just didn't last and you didn't have money to get more.: Never true  Transportation Needs: No Transportation Needs (08/03/2023)   Received from [facility] - Transportation    In the past 12 months, has lack of transportation kept you from medical appointments or from getting medications?: No    Lack of Transportation (Non-Medical): No   Physical Activity: Sufficiently Active (12/01/2022)   Exercise Vital Sign    Days of Exercise per Week: 5 days    Minutes of Exercise per Session: 40 min  Stress: No Stress Concern Present (12/01/2022)   [employer] of Occupational Health - Occupational Stress Questionnaire    Feeling of Stress : Only a little  Social Connections: Socially Integrated (12/01/2022)   Social Connection and Isolation Panel    Frequency of Communication with Friends and Family: More than three times a week    Frequency of Social Gatherings with Friends and Family: Three times a week    Attends Religious Services: More than 4 times per year    Active Member of Clubs or Organizations: Yes    Attends Banker Meetings: More than 4 times per year    Marital Status: Married     Review of Systems  Constitutional:  Negative for appetite change and unexpected weight change.  HENT:  Negative for congestion and sinus pressure.   Respiratory:  Negative for cough, chest tightness and shortness of breath.   Cardiovascular:  Negative for chest pain, palpitations and leg swelling.  Gastrointestinal:  Negative for abdominal pain, nausea and vomiting.  Genitourinary:  Negative for difficulty urinating and dysuria.  Musculoskeletal:  Negative for joint swelling and myalgias.  Skin:  Negative for color change and rash.  Neurological:  Negative for dizziness and headaches.  Psychiatric/Behavioral:  Negative for agitation and dysphoric mood.        Objective:     BP 130/74   Pulse 79   Resp 16   Ht 5' 4 (1.626 m)   Wt 146 lb 9.6 oz (66.5 kg)   SpO2 98%   BMI 25.16 kg/m  Wt Readings from Last 3 Encounters:  10/15/23 146 lb 9.6 oz (66.5 kg)  10/13/23 146 lb (66.2 kg)  09/28/23 144 lb (65.3 kg)    Physical Exam Vitals reviewed.  Constitutional:      General: She is not in acute distress.    Appearance: Normal appearance.  HENT:     Head: Normocephalic and atraumatic.     Right Ear:  External ear normal.     Left Ear: External ear normal.     Mouth/Throat:     Pharynx: No oropharyngeal exudate or posterior oropharyngeal erythema.  Eyes:     General: No scleral icterus.       Right eye: No discharge.        Left eye: No discharge.     Conjunctiva/sclera: Conjunctivae normal.  Neck:     Thyroid : No thyromegaly.  Cardiovascular:     Rate and Rhythm: Normal rate and regular rhythm.  Pulmonary:     Effort: No respiratory distress.     Breath sounds: Normal breath sounds. No wheezing.  Abdominal:     General: Bowel sounds are normal.     Palpations: Abdomen is soft.     Tenderness: There is no abdominal tenderness.  Musculoskeletal:        General: No swelling or tenderness.     Cervical back: Neck supple. No tenderness.  Lymphadenopathy:     Cervical: No cervical adenopathy.  Skin:    Findings: No erythema or rash.  Neurological:     Mental Status: She is alert.  Psychiatric:        Mood and Affect: Mood normal.        Behavior: Behavior normal.         Outpatient Encounter Medications as of 10/15/2023  Medication Sig   Alpha-Lipoic Acid 200 MG TABS Take 200 mg by mouth 3 (three) times daily.   amLODipine  (NORVASC ) 5 MG tablet Take 1 tablet (5 mg total) by mouth daily.   aspirin  81 MG tablet Take 1 tablet (81 mg total) by mouth daily.   atorvastatin  (LIPITOR) 20 MG tablet Take 1 tablet (20 mg total) by mouth daily.   chlorthalidone (HYGROTON) 25 MG tablet Take 12.5 mg by mouth every morning.   Cholecalciferol  25 MCG (1000 UT) tablet Take 1,000 Units by mouth daily.   Docusate Calcium  (STOOL SOFTENER PO) Take by mouth.   gabapentin  (NEURONTIN ) 100 MG capsule Take 2 capsules q hs   levothyroxine  (SYNTHROID ) 75 MCG tablet TAKE 1 TABLET BY MOUTH ONCE DAILY IN THE MORNING BEFORE BREAKFAST   Multiple Vitamin (MULTIVITAMIN) tablet Take 1 tablet by mouth daily.   Omega-3 Fatty Acids (FISH  OIL) 1000 MG CAPS Take 1,000 mg by mouth daily.   pantoprazole   (PROTONIX ) 40 MG tablet Take 1 tablet (40 mg total) by mouth daily. Take 30 minutes before breakfast   Polyethylene Glycol 3350 (MIRALAX PO) Take by mouth as needed.   Vibegron  (GEMTESA ) 75 MG TABS Take 1 tablet (75 mg total) by mouth daily.   Wheat Dextrin (BENEFIBER PO) Take by mouth.   [DISCONTINUED] mirabegron  ER (MYRBETRIQ ) 50 MG TB24 tablet Take 1 tablet (50 mg total) by mouth daily.   [DISCONTINUED] nitrofurantoin , macrocrystal-monohydrate, (MACROBID ) 100 MG capsule Take 1 capsule (100 mg total) by mouth every 12 (twelve) hours.   No facility-administered encounter medications on file as of 10/15/2023.     Lab Results  Component Value Date   WBC 10.1 03/04/2023   HGB 12.7 03/04/2023   HCT 38.3 03/04/2023   PLT 182.0 03/04/2023   GLUCOSE 98 10/11/2023   CHOL 144 10/11/2023   TRIG 161.0 (H) 10/11/2023   HDL 42.50 10/11/2023   LDLDIRECT 80.0 01/05/2022   LDLCALC 70 10/11/2023   ALT 16 10/11/2023   AST 23 10/11/2023   NA 140 10/11/2023   K 4.3 10/11/2023   CL 102 10/11/2023   CREATININE 1.59 (H) 10/11/2023   BUN 28 (H) 10/11/2023   CO2 29 10/11/2023   TSH 5.07 10/11/2023   INR 1.1 06/18/2022   HGBA1C 5.8 10/11/2023       Assessment & Plan:  Elevated alkaline phosphatase level Assessment & Plan: Previous GGT wnl.  Intact PTH and vitamin D  wnl.  Recheck liver panel.   Orders: -     Hepatic function panel; Future  Hypercholesterolemia Assessment & Plan: Continue lipitor. Low cholesterol diet and exercise.  Follow lipid panel and liver function tests.   Orders: -     Lipid panel; Future -     Hepatic function panel; Future  Hyperglycemia Assessment & Plan: Low carb diet and exercise. Follow metabolic panel and A1c.   Orders: -     Hemoglobin A1c; Future  Primary hypertension Assessment & Plan: Continue amlodipine . Blood pressure as outlined. No changes in medication today. Follow pressures. Follow metabolic panel.   Orders: -     Basic metabolic panel with  GFR; Future  Aortic atherosclerosis (HCC) Assessment & Plan: Continue lipitor.    Nonrheumatic aortic valve insufficiency Assessment & Plan: ECHO - aortic regurgitation.  Saw cardiology.  Coronary artery calcifications on noncontrast chest CT. Previous echocardiogram with normal systolic and diastolic function.  Lexiscan  Myoview  03/2020 with no significant ischemia, low risk.  Continue aspirin , statin.  Saw cardiology 10/14/22 - f/u low blood pressure and increased heart rate.  Was feeling better. Given her issues with increased potassium and renal function - micardis was stopped.   Currently on amlodipine  5mg  q day.  Blood pressure as outlined. No changes in medication. Follow pressures.    Atrial fibrillation, unspecified type [facility]) Assessment & Plan: Was found to have new onset afib with RVR (up to 186) - during recent hospitalization. Was given IV cardizem  - followed by oral cardizem .  CXR negative.  Dr Darron consulted. Found to have UTI - E.coli pan sensitive.  IV abx and discharged on cipro  for 10 days total abx.  Not started on anticoagulants.  Was felt afib - related to acute illness.  Zio monitor placed.  Discharged on metoprolol . Had f/u with cardiology 08/21/22. No further arrhythmias noted. Previous myoview  - no ischemia.  Echo with normal EF.  Recommended continuing aspirin  and statin. Hold  anticoagulation - lone afib in the setting of UTI sepsis.  Overall doing well. Appears to be in SR today. Stable. Follow.    CKD (chronic kidney disease) stage 4, GFR 15-29 ml/min (HCC) Assessment & Plan: On amlodipine . Followed by nephrology. Avoid antiinflammatory medications. Follow metabolic panel.    Frequent UTI Assessment & Plan: Followed by urology. Recent evaluation as outlined. Taking cam[place]on. Follow. No symptoms today.    Thrombocytopenia (HCC) Assessment & Plan: Follow cbc.    Hypothyroidism, unspecified type Assessment & Plan: On thyroid  replacement. Follow tsh.     Gastroesophageal reflux disease without esophagitis Assessment & Plan: EGD - 08/09/20 - mild reactive gastropathy, fundic gland polyps, mucosa with edema and congestion. Persistent abdominal discomfort as outlined previous note.  Saw GI 12/2022. Recommended protonix  bid.  Follow up with GI 09/09/23 - recommended EGD. Evaluated for diarrhea, dyspepsia and constipation. EGD 10/13/23 - gastritis, multiple gastric polyps, 2cm hiatal hernia. No increased upper symptoms reported today. Follow.       Allena Hamilton, MD

## 2023-10-17 DIAGNOSIS — R350 Frequency of micturition: Secondary | ICD-10-CM | POA: Diagnosis not present

## 2023-10-18 ENCOUNTER — Encounter: Payer: Self-pay | Admitting: Internal Medicine

## 2023-10-18 ENCOUNTER — Encounter: Payer: Self-pay | Admitting: Urology

## 2023-10-18 NOTE — Assessment & Plan Note (Signed)
 Followed by urology. Recent evaluation as outlined. Taking cam[place]. Follow. No symptoms today.

## 2023-10-18 NOTE — Assessment & Plan Note (Signed)
 Was found to have new onset afib with RVR (up to 186) - during recent hospitalization. Was given IV cardizem  - followed by oral cardizem .  CXR negative.  Dr Darron consulted. Found to have UTI - E.coli pan sensitive.  IV abx and discharged on cipro  for 10 days total abx.  Not started on anticoagulants.  Was felt afib - related to acute illness.  Zio monitor placed.  Discharged on metoprolol . Had f/u with cardiology 08/21/22. No further arrhythmias noted. Previous myoview  - no ischemia.  Echo with normal EF.  Recommended continuing aspirin  and statin. Hold anticoagulation - lone afib in the setting of UTI sepsis.  Overall doing well. Appears to be in SR today. Stable. Follow.

## 2023-10-18 NOTE — Assessment & Plan Note (Signed)
 On amlodipine . Followed by nephrology. Avoid antiinflammatory medications. Follow metabolic panel.

## 2023-10-18 NOTE — Assessment & Plan Note (Signed)
Low-carb diet and exercise.  Follow metabolic panel and A1c. 

## 2023-10-18 NOTE — Assessment & Plan Note (Signed)
 Continue lipitor.  Low cholesterol diet and exercise.  Follow lipid panel and liver function tests.

## 2023-10-18 NOTE — Assessment & Plan Note (Signed)
 EGD - 08/09/20 - mild reactive gastropathy, fundic gland polyps, mucosa with edema and congestion. Persistent abdominal discomfort as outlined previous note.  Saw GI 12/2022. Recommended protonix  bid.  Follow up with GI 09/09/23 - recommended EGD. Evaluated for diarrhea, dyspepsia and constipation. EGD 10/13/23 - gastritis, multiple gastric polyps, 2cm hiatal hernia. No increased upper symptoms reported today. Follow.

## 2023-10-18 NOTE — Assessment & Plan Note (Signed)
 On thyroid replacement.  Follow tsh.

## 2023-10-18 NOTE — Assessment & Plan Note (Signed)
 Continue lipitor  ?

## 2023-10-18 NOTE — Assessment & Plan Note (Signed)
 Previous GGT wnl.  Intact PTH and vitamin D  wnl.  Recheck liver panel.

## 2023-10-18 NOTE — Assessment & Plan Note (Signed)
 Continue amlodipine . Blood pressure as outlined. No changes in medication today. Follow pressures. Follow metabolic panel.

## 2023-10-18 NOTE — Assessment & Plan Note (Signed)
 ECHO - aortic regurgitation.  Saw cardiology.  Coronary artery calcifications on noncontrast chest CT. Previous echocardiogram with normal systolic and diastolic function.  Lexiscan  Myoview  03/2020 with no significant ischemia, low risk.  Continue aspirin , statin.  Saw cardiology 10/14/22 - f/u low blood pressure and increased heart rate.  Was feeling better. Given her issues with increased potassium and renal function - micardis was stopped.   Currently on amlodipine  5mg  q day.  Blood pressure as outlined. No changes in medication. Follow pressures.

## 2023-10-18 NOTE — Assessment & Plan Note (Signed)
 Follow cbc.

## 2023-11-01 DIAGNOSIS — H353131 Nonexudative age-related macular degeneration, bilateral, early dry stage: Secondary | ICD-10-CM | POA: Diagnosis not present

## 2023-11-01 DIAGNOSIS — H43813 Vitreous degeneration, bilateral: Secondary | ICD-10-CM | POA: Diagnosis not present

## 2023-11-01 DIAGNOSIS — M3501 Sicca syndrome with keratoconjunctivitis: Secondary | ICD-10-CM | POA: Diagnosis not present

## 2023-11-01 DIAGNOSIS — Z961 Presence of intraocular lens: Secondary | ICD-10-CM | POA: Diagnosis not present

## 2023-11-08 ENCOUNTER — Other Ambulatory Visit: Payer: Self-pay | Admitting: Internal Medicine

## 2023-11-11 ENCOUNTER — Ambulatory Visit
Admission: RE | Admit: 2023-11-11 | Discharge: 2023-11-11 | Disposition: A | Discharge status: Home / Self Care | Source: Ambulatory Visit | Attending: Internal Medicine | Admitting: Internal Medicine

## 2023-11-11 DIAGNOSIS — Z1231 Encounter for screening mammogram for malignant neoplasm of breast: Secondary | ICD-10-CM | POA: Diagnosis not present

## 2023-11-12 ENCOUNTER — Other Ambulatory Visit (INDEPENDENT_AMBULATORY_CARE_PROVIDER_SITE_OTHER)

## 2023-11-12 DIAGNOSIS — R748 Abnormal levels of other serum enzymes: Secondary | ICD-10-CM

## 2023-11-12 DIAGNOSIS — E7800 Pure hypercholesterolemia, unspecified: Secondary | ICD-10-CM | POA: Diagnosis not present

## 2023-11-12 DIAGNOSIS — I10 Essential (primary) hypertension: Secondary | ICD-10-CM

## 2023-11-12 DIAGNOSIS — R739 Hyperglycemia, unspecified: Secondary | ICD-10-CM

## 2023-11-12 LAB — BASIC METABOLIC PANEL WITH GFR
BUN: 22 mg/dL (ref 6–23)
CO2: 30 meq/L (ref 19–32)
Calcium: 9.2 mg/dL (ref 8.4–10.5)
Chloride: 103 meq/L (ref 96–112)
Creatinine, Ser: 1.66 mg/dL — ABNORMAL HIGH (ref 0.40–1.20)
GFR: 28.55 mL/min — ABNORMAL LOW (ref >60.00)
Glucose, Bld: 95 mg/dL (ref 70–99)
Potassium: 4.7 meq/L (ref 3.5–5.1)
Sodium: 142 meq/L (ref 135–145)

## 2023-11-12 LAB — LIPID PANEL
Cholesterol: 157 mg/dL (ref 0–200)
HDL: 40.4 mg/dL (ref >39.00)
LDL Cholesterol: 75 mg/dL (ref 0–99)
NonHDL: 116.27
Total CHOL/HDL Ratio: 4
Triglycerides: 208 mg/dL — ABNORMAL HIGH (ref 0.0–149.0)
VLDL: 41.6 mg/dL — ABNORMAL HIGH (ref 0.0–40.0)

## 2023-11-12 LAB — HEPATIC FUNCTION PANEL
ALT: 13 U/L (ref 0–35)
AST: 20 U/L (ref 0–37)
Albumin: 3.9 g/dL (ref 3.5–5.2)
Alkaline Phosphatase: 129 U/L — ABNORMAL HIGH (ref 39–117)
Bilirubin, Direct: 0.1 mg/dL (ref 0.0–0.3)
Total Bilirubin: 0.7 mg/dL (ref 0.2–1.2)
Total Protein: 6.6 g/dL (ref 6.0–8.3)

## 2023-11-12 LAB — HEMOGLOBIN A1C: Hgb A1c MFr Bld: 6 % (ref 4.6–6.5)

## 2023-11-12 NOTE — Addendum Note (Signed)
 Addended by: MARYLEN GAVIN LABOR on: 11/12/2023 09:52 AM   Modules accepted: Orders

## 2023-11-13 ENCOUNTER — Other Ambulatory Visit: Payer: Self-pay | Admitting: Internal Medicine

## 2023-11-16 ENCOUNTER — Ambulatory Visit: Payer: Self-pay | Admitting: Internal Medicine

## 2023-11-29 ENCOUNTER — Other Ambulatory Visit: Payer: Self-pay | Admitting: Internal Medicine

## 2023-11-30 ENCOUNTER — Ambulatory Visit: Admitting: Urology

## 2023-12-01 ENCOUNTER — Ambulatory Visit: Admitting: Internal Medicine

## 2023-12-01 NOTE — Progress Notes (Unsigned)
 12/02/2023 9:40 PM   [name] Shed October 11, 1940 [id number]  Referring provider: Glendia Shad, MD [address] [place],  [place] [address]  Urological history: 1. Mixed incontinence - failed Myrbetriq  50 mg daily, caused stomach upset  2. Cystocele - bladder suspension many years again by Dr. Charlann at [facility]  - grade III  3. rUTI's - non contrast (08/2022) mild prominent bilateral extrarenal pelves and right renal cyst  - cysto (09/2022) cystitis cystica - Had been on trimethoprim  100 mg daily - [place]   4. Renal cyst - CT (08/2022) right renal cyst  No chief complaint on file.  HPI: Caitlin Lin is a 83 y.o. woman who presents today for recheck on micro heme after positive urine culture.      Previous records reviewed.   After she completed her Macrobid , her symptoms returned with frequency, urgency and pain with urination.  UA (10/2023) > 50 WBC's, 4-10 RBC's and moderate bacteria.  Urine culture positive for E.coli.    UA (11/2023) ***  Hbg A1c (10/2023) 6.0  Serum creatinine (10/2023) 1.66, eGFR 28.55   PMH: Past Medical History:  Diagnosis Date   Cataracts, bilateral    Chronic kidney disease    Followed by Dr. Marcelino   Chronic kidney disease    Colon polyp    Constipation due to slow transit    Diarrhea    Diarrhea    GERD (gastroesophageal reflux disease)    Heart murmur    History of cyst of breast    History of hiatal hernia    Hyperlipidemia    Hypertension    Hypothyroidism    Melanoma of lower leg (HCC) 2013   Shingles 2013   Shingles    Spinal stenosis in cervical region    Dr. Avanell   Subdural hematoma [facility])    Thyroid  disease     Surgical History: Past Surgical History:  Procedure Laterality Date   ABDOMINAL HYSTERECTOMY  1974   ANTERIOR AND POSTERIOR VAGINAL REPAIR     ANTERIOR AND POSTERIOR VAGINAL REPAIR W/ SACROSPINOUS LIGAMENT SUSPENSION Right    BREAST BIOPSY  1963   Benign per pt's  report   BREAST EXCISIONAL BIOPSY Right 1963   neg   COLONOSCOPY     COLONOSCOPY N/A 10/29/2021   Procedure: COLONOSCOPY;  Surgeon: Toledo, Ladell POUR, MD;  Location: [facility] ENDOSCOPY;  Service: Gastroenterology;  Laterality: N/A;   ELECTROMAGNETIC NAVIGATION BROCHOSCOPY Right 01/09/2019   Procedure: ELECTROMAGNETIC NAVIGATION BRONCHOSCOPY;  Surgeon: Tamea Dedra CROME, MD;  Location: [facility] ORS;  Service: Cardiopulmonary;  Laterality: Right;   ESOPHAGOGASTRODUODENOSCOPY     ESOPHAGOGASTRODUODENOSCOPY N/A 10/13/2023   Procedure: EGD (ESOPHAGOGASTRODUODENOSCOPY);  Surgeon: Toledo, Ladell POUR, MD;  Location: [facility] ENDOSCOPY;  Service: Gastroenterology;  Laterality: N/A;   ESOPHAGOGASTRODUODENOSCOPY (EGD) WITH PROPOFOL  N/A 11/10/2016   Procedure: ESOPHAGOGASTRODUODENOSCOPY (EGD) WITH PROPOFOL ;  Surgeon: Gaylyn Gladis PENNER, MD;  Location: [facility] ENDOSCOPY;  Service: Endoscopy;  Laterality: N/A;   ESOPHAGOGASTRODUODENOSCOPY (EGD) WITH PROPOFOL  N/A 09/09/2018   Procedure: ESOPHAGOGASTRODUODENOSCOPY (EGD) WITH PROPOFOL ;  Surgeon: Gaylyn Gladis PENNER, MD;  Location: [facility] ENDOSCOPY;  Service: Endoscopy;  Laterality: N/A;   ESOPHAGOGASTRODUODENOSCOPY (EGD) WITH PROPOFOL  N/A 10/27/2019   Procedure: ESOPHAGOGASTRODUODENOSCOPY (EGD) WITH PROPOFOL ;  Surgeon: Dessa Reyes ORN, MD;  Location: [facility] ENDOSCOPY;  Service: Endoscopy;  Laterality: N/A;   ESOPHAGOGASTRODUODENOSCOPY (EGD) WITH PROPOFOL  N/A 08/09/2020   Procedure: ESOPHAGOGASTRODUODENOSCOPY (EGD) WITH PROPOFOL ;  Surgeon: Maryruth Ole DASEN, MD;  Location: [facility] ENDOSCOPY;  Service: Endoscopy;  Laterality: N/A;  LUNG BIOPSY  2007   Benign per pt's report   LUNG BIOPSY Right    OOPHORECTOMY  1970   PERINEOPLASTY  12/17/2013   sling for stress incontinence  12/18/2011    Home Medications:  Allergies as of 12/02/2023       Reactions   Quinapril  Other (See Comments)   Hyperkalemia   Augmentin  [amoxicillin -pot Clavulanate] Other (See Comments)   GI upset    Myrbetriq  [mirabegron ] Other (See Comments)   GI upset    Codeine Rash        Medication List        Accurate as of December 01, 2023  9:40 PM. If you have any questions, ask your nurse or doctor.          Alpha-Lipoic Acid 200 MG Tabs Take 200 mg by mouth 3 (three) times daily.   amLODipine  5 MG tablet Commonly known as: NORVASC  Take 1 tablet (5 mg total) by mouth daily.   aspirin  81 MG tablet Take 1 tablet (81 mg total) by mouth daily.   atorvastatin  20 MG tablet Commonly known as: LIPITOR Take 1 tablet (20 mg total) by mouth daily.   BENEFIBER PO Take by mouth.   chlorthalidone 25 MG tablet Commonly known as: HYGROTON Take 12.5 mg by mouth every morning.   Cholecalciferol  25 MCG (1000 UT) tablet Take 1,000 Units by mouth daily.   Fish Oil  1000 MG Caps Take 1,000 mg by mouth daily.   gabapentin  100 MG capsule Commonly known as: NEURONTIN  TAKE 2 CAPSULES AT BEDTIME   Gemtesa  75 MG Tabs Generic drug: Vibegron  Take 1 tablet (75 mg total) by mouth daily.   levothyroxine  75 MCG tablet Commonly known as: SYNTHROID  TAKE 1 TABLET EVERY DAY ON EMPTY STOMACHWITH A GLASS OF WATER AT LEAST 30-60 MINBEFORE BREAKFAST   MIRALAX PO Take by mouth as needed.   multivitamin tablet Take 1 tablet by mouth daily.   pantoprazole  40 MG tablet Commonly known as: PROTONIX  TAKE 1 TABLET BY MOUTH DAILY. TAKE 30 MINUTES BEFORE BREAKFAST.   STOOL SOFTENER PO Take by mouth.        Allergies:  Allergies  Allergen Reactions   Quinapril  Other (See Comments)    Hyperkalemia   Augmentin  [Amoxicillin -Pot Clavulanate] Other (See Comments)    GI upset   Myrbetriq  [Mirabegron ] Other (See Comments)    GI upset    Codeine Rash    Family History: Family History  Problem Relation Age of Onset   Arthritis Mother    Liver disease Mother    Cirrhosis Mother    Liver cancer Mother    Heart disease Father    Heart attack Father    Coronary artery disease Father     Depression Brother    Prostate cancer Brother    Diabetes Maternal Aunt    Breast cancer Cousin        maternal cousins   Liver cancer Cousin    Breast cancer Other     Social History: See HPI for pertinent social history  ROS: Pertinent ROS in HPI  Physical Exam: There were no vitals taken for this visit.  Constitutional:  Well nourished. Alert and oriented, No acute distress. HEENT: [place] AT, moist mucus membranes.  Trachea midline, no masses. Cardiovascular: No clubbing, cyanosis, or edema. Respiratory: Normal respiratory effort, no increased work of breathing. GU: No CVA tenderness.  No bladder fullness or masses.  Recession of labia minora, dry, pale vulvar vaginal mucosa and loss of mucosal ridges  and folds.  Normal urethral meatus, no lesions, no prolapse, no discharge.   No urethral masses, tenderness and/or tenderness. No bladder fullness, tenderness or masses. *** vagina mucosa, *** estrogen effect, no discharge, no lesions, *** pelvic support, *** cystocele and *** rectocele noted.  No cervical motion tenderness.  Uterus is freely mobile and non-fixed.  No adnexal/parametria masses or tenderness noted.  Anus and perineum are without rashes or lesions.   ***  Neurologic: Grossly intact, no focal deficits, moving all 4 extremities. Psychiatric: Normal mood and affect.    Laboratory Data: See EPIC and HPI  I have reviewed the labs.   Pertinent Imaging: N/A  Assessment & Plan:    1. rUTI's vs asymptomatic bacteriuria - UA *** - start vaginal estrogen cream nightly for 30 days and then three nights weekly  2.  Microscopic hematuria - UA w/ ***  No follow-ups on file.  These notes generated with voice recognition software. I apologize for typographical errors.  CLOTILDA HELON RIGGERS  [facility] [address]  [address] [place], [place] [address] [phone]

## 2023-12-02 ENCOUNTER — Ambulatory Visit: Admitting: Urology

## 2023-12-02 ENCOUNTER — Encounter: Payer: Self-pay | Admitting: Urology

## 2023-12-02 VITALS — BP 159/80 | HR 67 | Ht 64.0 in | Wt 149.6 lb

## 2023-12-02 DIAGNOSIS — R3129 Other microscopic hematuria: Secondary | ICD-10-CM | POA: Diagnosis not present

## 2023-12-02 DIAGNOSIS — N390 Urinary tract infection, site not specified: Secondary | ICD-10-CM

## 2023-12-02 DIAGNOSIS — N952 Postmenopausal atrophic vaginitis: Secondary | ICD-10-CM | POA: Diagnosis not present

## 2023-12-02 LAB — URINALYSIS, COMPLETE
Bilirubin, UA: NEGATIVE
Glucose, UA: NEGATIVE
Ketones, UA: NEGATIVE
Nitrite, UA: POSITIVE — AB
Protein,UA: NEGATIVE
Specific Gravity, UA: 1.01 (ref 1.005–1.030)
Urobilinogen, Ur: 0.2 mg/dL (ref 0.2–1.0)
pH, UA: 6 (ref 5.0–7.5)

## 2023-12-02 LAB — MICROSCOPIC EXAMINATION: WBC, UA: >30 /HPF — AB (ref 0–5)

## 2023-12-02 MED ORDER — NITROFURANTOIN MONOHYD MACRO 100 MG PO CAPS: 100.0000 mg | ORAL_CAPSULE | Freq: Two times a day (BID) | ORAL | 0 refills | Status: AC

## 2023-12-02 MED ORDER — ESTRADIOL 0.1 MG/GM VA CREA: TOPICAL_CREAM | VAGINAL | 12 refills | Status: AC

## 2023-12-07 ENCOUNTER — Ambulatory Visit: Payer: Self-pay | Admitting: Urology

## 2023-12-07 LAB — CULTURE, URINE COMPREHENSIVE

## 2023-12-08 ENCOUNTER — Ambulatory Visit (INDEPENDENT_AMBULATORY_CARE_PROVIDER_SITE_OTHER): Admitting: *Deleted

## 2023-12-08 ENCOUNTER — Ambulatory Visit

## 2023-12-08 VITALS — Ht 64.0 in | Wt 146.0 lb

## 2023-12-08 DIAGNOSIS — Z0000 Encounter for general adult medical examination without abnormal findings: Secondary | ICD-10-CM

## 2023-12-08 NOTE — Patient Instructions (Addendum)
 Caitlin Lin,  Thank you for taking the time for your Medicare Wellness Visit. I appreciate your continued commitment to your health goals. Please review the care plan we discussed, and feel free to reach out if I can assist you further.  Medicare recommends these wellness visits once per year to help you and your care team stay ahead of potential health issues. These visits are designed to focus on prevention, allowing your provider to concentrate on managing your acute and chronic conditions during your regular appointments.  Please note that Annual Wellness Visits do not include a physical exam. Some assessments may be limited, especially if the visit was conducted virtually. If needed, we may recommend a separate in-person follow-up with your provider.  Ongoing Care Seeing your primary care provider every 3 to 6 months helps us  monitor your health and provide consistent, personalized care.   Remember to update your flu, tetanus and shingles vaccines.   Referrals If a referral was made during today's visit and you haven't received any updates within two weeks, please contact the referred provider directly to check on the status.  Recommended Screenings:  Health Maintenance  Topic Date Due   COVID-19 Vaccine (3 - 2025-26 season) 11/15/2023   Zoster (Shingles) Vaccine (1 of 2) 01/15/2024*   DTaP/Tdap/Td vaccine (2 - Td or Tdap) 03/03/2024*   Flu Shot  06/13/2024*   Breast Cancer Screening  11/10/2024   Medicare Annual Wellness Visit  12/07/2024   Pneumococcal Vaccine for age over 61  Completed   DEXA scan (bone density measurement)  Completed   HPV Vaccine  Aged Out   Meningitis B Vaccine  Aged Out   Hepatitis B Vaccine  Discontinued   Hepatitis C Screening  Discontinued  *Topic was postponed. The date shown is not the original due date.       12/08/2023    2:16 PM  Advanced Directives  Does Patient Have a Medical Advance Directive? Yes  Type of Engineer, mining of [place];Living will  Copy of Healthcare Power of Attorney in Chart? No - copy requested   Advance Care Planning is important because it: Ensures you receive medical care that aligns with your values, goals, and preferences. Provides guidance to your family and loved ones, reducing the emotional burden of decision-making during critical moments.  Vision: Annual vision screenings are recommended for early detection of glaucoma, cataracts, and diabetic retinopathy. These exams can also reveal signs of chronic conditions such as diabetes and high blood pressure.  Dental: Annual dental screenings help detect early signs of oral cancer, gum disease, and other conditions linked to overall health, including heart disease and diabetes.  Please see the attached documents for additional preventive care recommendations.

## 2023-12-08 NOTE — Progress Notes (Signed)
 Subjective:   Caitlin Lin is a 83 y.o. who presents for a Medicare Wellness preventive visit.  As a reminder, Annual Wellness Visits don't include a physical exam, and some assessments may be limited, especially if this visit is performed virtually. We may recommend an in-person follow-up visit with your provider if needed.  Visit Complete: Virtual I connected with  Caitlin Lin on 12/08/23 by a audio enabled telemedicine application and verified that I am speaking with the correct person using two identifiers.  Patient Location: Home  Provider Location: Home Office  I discussed the limitations of evaluation and management by telemedicine. The patient expressed understanding and agreed to proceed.  Vital Signs: Because this visit was a virtual/telehealth visit, some criteria may be missing or patient reported. Any vitals not documented were not able to be obtained and vitals that have been documented are patient reported.  VideoDeclined- This patient declined Librarian, academic. Therefore the visit was completed with audio only.  Persons Participating in Visit: Patient.  AWV Questionnaire: No: Patient Medicare AWV questionnaire was not completed prior to this visit.  Cardiac Risk Factors include: advanced age (>55men, >52 women);dyslipidemia;hypertension;Other (see comment), Risk factor comments: AFib     Objective:    Today's Vitals   12/08/23 1401  Weight: 146 lb (66.2 kg)  Height: 5' 4 (1.626 m)   Body mass index is 25.06 kg/m.     12/08/2023    2:16 PM 12/01/2022   11:32 AM 11/27/2022   12:55 PM 06/20/2022    9:11 AM 11/26/2021   10:18 AM 10/29/2021   12:20 PM 09/22/2021   10:28 AM  Advanced Directives  Does Patient Have a Medical Advance Directive? Yes Yes No Yes Yes No;Yes No  Type of Estate agent of [place];Living will Healthcare Power of [place];Living will  Living will Healthcare Power of [place];Living  will Living will   Does patient want to make changes to medical advance directive?    No - Patient declined No - Patient declined    Copy of Healthcare Power of Attorney in Chart? No - copy requested No - copy requested   Yes - validated most recent copy scanned in chart (See row information)    Would patient like information on creating a medical advance directive?       No - Patient declined    Current Medications (verified) Outpatient Encounter Medications as of 12/08/2023  Medication Sig   Alpha-Lipoic Acid 200 MG TABS Take 200 mg by mouth 3 (three) times daily.   amLODipine  (NORVASC ) 5 MG tablet Take 1 tablet (5 mg total) by mouth daily.   aspirin  81 MG tablet Take 1 tablet (81 mg total) by mouth daily.   atorvastatin  (LIPITOR) 20 MG tablet Take 1 tablet (20 mg total) by mouth daily.   chlorthalidone (HYGROTON) 25 MG tablet Take 12.5 mg by mouth every morning.   Cholecalciferol  25 MCG (1000 UT) tablet Take 1,000 Units by mouth daily.   Docusate Calcium  (STOOL SOFTENER PO) Take by mouth.   estradiol  (ESTRACE  VAGINAL) 0.1 MG/GM vaginal cream Apply 0.5mg  (pea-sized amount)  just inside the vaginal introitus with a finger-tip every day for 30 days and then on Monday, Wednesday and Friday nights.   gabapentin  (NEURONTIN ) 100 MG capsule TAKE 2 CAPSULES AT BEDTIME   levothyroxine  (SYNTHROID ) 75 MCG tablet TAKE 1 TABLET EVERY DAY ON EMPTY STOMACHWITH A GLASS OF WATER AT LEAST 30-60 MINBEFORE BREAKFAST   Multiple Vitamin (MULTIVITAMIN) tablet Take  1 tablet by mouth daily.   nitrofurantoin , macrocrystal-monohydrate, (MACROBID ) 100 MG capsule Take 1 capsule (100 mg total) by mouth every 12 (twelve) hours. (Patient taking differently: Take 100 mg by mouth every 12 (twelve) hours. As needed)   Omega-3 Fatty Acids (FISH OIL ) 1000 MG CAPS Take 1,000 mg by mouth daily.   pantoprazole  (PROTONIX ) 40 MG tablet TAKE 1 TABLET BY MOUTH DAILY. TAKE 30 MINUTES BEFORE BREAKFAST.   Polyethylene Glycol 3350 (MIRALAX  PO) Take by mouth as needed.   Wheat Dextrin (BENEFIBER PO) Take by mouth. (Patient taking differently: Take by mouth. As needed)   Vibegron  (GEMTESA ) 75 MG TABS Take 1 tablet (75 mg total) by mouth daily. (Patient not taking: Reported on 12/08/2023)   No facility-administered encounter medications on file as of 12/08/2023.    Allergies (verified) Quinapril , Augmentin  [amoxicillin -pot clavulanate], Myrbetriq  [mirabegron ], and Codeine   History: Past Medical History:  Diagnosis Date   Cataracts, bilateral    Chronic kidney disease    Followed by Dr. Marcelino   Chronic kidney disease    Colon polyp    Constipation due to slow transit    Diarrhea    Diarrhea    GERD (gastroesophageal reflux disease)    Heart murmur    History of cyst of breast    History of hiatal hernia    Hyperlipidemia    Hypertension    Hypothyroidism    Melanoma of lower leg (HCC) 2013   Shingles 2013   Shingles    Spinal stenosis in cervical region    Dr. Avanell   Subdural hematoma (HCC)    Thyroid  disease    Past Surgical History:  Procedure Laterality Date   ABDOMINAL HYSTERECTOMY  1974   ANTERIOR AND POSTERIOR VAGINAL REPAIR     ANTERIOR AND POSTERIOR VAGINAL REPAIR W/ SACROSPINOUS LIGAMENT SUSPENSION Right    BREAST BIOPSY  1963   Benign per pt's report   BREAST EXCISIONAL BIOPSY Right 1963   neg   COLONOSCOPY     COLONOSCOPY N/A 10/29/2021   Procedure: COLONOSCOPY;  Surgeon: Toledo, Ladell POUR, MD;  Location: [facility] ENDOSCOPY;  Service: Gastroenterology;  Laterality: N/A;   ELECTROMAGNETIC NAVIGATION BROCHOSCOPY Right 01/09/2019   Procedure: ELECTROMAGNETIC NAVIGATION BRONCHOSCOPY;  Surgeon: Tamea Dedra CROME, MD;  Location: [facility] ORS;  Service: Cardiopulmonary;  Laterality: Right;   ESOPHAGOGASTRODUODENOSCOPY     ESOPHAGOGASTRODUODENOSCOPY N/A 10/13/2023   Procedure: EGD (ESOPHAGOGASTRODUODENOSCOPY);  Surgeon: Toledo, Ladell POUR, MD;  Location: [facility] ENDOSCOPY;  Service: Gastroenterology;   Laterality: N/A;   ESOPHAGOGASTRODUODENOSCOPY (EGD) WITH PROPOFOL  N/A 11/10/2016   Procedure: ESOPHAGOGASTRODUODENOSCOPY (EGD) WITH PROPOFOL ;  Surgeon: Gaylyn Gladis PENNER, MD;  Location: [facility] ENDOSCOPY;  Service: Endoscopy;  Laterality: N/A;   ESOPHAGOGASTRODUODENOSCOPY (EGD) WITH PROPOFOL  N/A 09/09/2018   Procedure: ESOPHAGOGASTRODUODENOSCOPY (EGD) WITH PROPOFOL ;  Surgeon: Gaylyn Gladis PENNER, MD;  Location: [facility] ENDOSCOPY;  Service: Endoscopy;  Laterality: N/A;   ESOPHAGOGASTRODUODENOSCOPY (EGD) WITH PROPOFOL  N/A 10/27/2019   Procedure: ESOPHAGOGASTRODUODENOSCOPY (EGD) WITH PROPOFOL ;  Surgeon: Dessa Reyes ORN, MD;  Location: [facility] ENDOSCOPY;  Service: Endoscopy;  Laterality: N/A;   ESOPHAGOGASTRODUODENOSCOPY (EGD) WITH PROPOFOL  N/A 08/09/2020   Procedure: ESOPHAGOGASTRODUODENOSCOPY (EGD) WITH PROPOFOL ;  Surgeon: Maryruth Ole DASEN, MD;  Location: [facility] ENDOSCOPY;  Service: Endoscopy;  Laterality: N/A;   LUNG BIOPSY  2007   Benign per pt's report   LUNG BIOPSY Right    OOPHORECTOMY  1970   PERINEOPLASTY  12/17/2013   sling for stress incontinence  12/18/2011   Family History  Problem Relation Age of Onset  Arthritis Mother    Liver disease Mother    Cirrhosis Mother    Liver cancer Mother    Heart disease Father    Heart attack Father    Coronary artery disease Father    Depression Brother    Prostate cancer Brother    Diabetes Maternal Aunt    Breast cancer Cousin        maternal cousins   Liver cancer Cousin    Breast cancer Other    Social History   Socioeconomic History   Marital status: Married    Spouse name: Not on file   Number of children: 2   Years of education: Not on file   Highest education level: Not on file  Occupational History   Occupation: Retired  Tobacco Use   Smoking status: Never    Passive exposure: Never   Smokeless tobacco: Never  Vaping Use   Vaping status: Never Used  Substance and Sexual Activity   Alcohol use: No   Drug use: No    Sexual activity: Yes  Other Topics Concern   Not on file  Social History Narrative   Lives in [place] with husband. Has 2 children boy and girl, 4 grandchildren. Retired Insurance underwriter.       Daily Caffeine Use:  NO   Regular Exercise -  Not at this time due to back pain, would like to resume soon   Social Drivers of Corporate investment banker Strain: Low Risk  (12/08/2023)   Overall Financial Resource Strain (CARDIA)    Difficulty of Paying Living Expenses: Not hard at all  Food Insecurity: No Food Insecurity (12/08/2023)   Hunger Vital Sign    Worried About Running Out of Food in the Last Year: Never true    Ran Out of Food in the Last Year: Never true  Transportation Needs: No Transportation Needs (12/08/2023)   PRAPARE - Administrator, Civil Service (Medical): No    Lack of Transportation (Non-Medical): No  Physical Activity: Inactive (12/08/2023)   Exercise Vital Sign    Days of Exercise per Week: 0 days    Minutes of Exercise per Session: 0 min  Stress: No Stress Concern Present (12/08/2023)   [employer] of Occupational Health - Occupational Stress Questionnaire    Feeling of Stress: Not at all  Social Connections: Socially Integrated (12/08/2023)   Social Connection and Isolation Panel    Frequency of Communication with Friends and Family: More than three times a week    Frequency of Social Gatherings with Friends and Family: Three times a week    Attends Religious Services: More than 4 times per year    Active Member of Clubs or Organizations: Yes    Attends Engineer, structural: More than 4 times per year    Marital Status: Married    Tobacco Counseling Counseling given: Not Answered    Clinical Intake:  Pre-visit preparation completed: Yes  Pain : No/denies pain     BMI - recorded: 25.06 Nutritional Status: BMI 25 -29 Overweight Nutritional Risks: None Diabetes: No  Lab Results  Component Value Date   HGBA1C 6.0 11/12/2023    HGBA1C 5.8 10/11/2023   HGBA1C 5.7 07/08/2023     How often do you need to have someone help you when you read instructions, pamphlets, or other written materials from your doctor or pharmacy?: 1 - Never  Interpreter Needed?: No  Information entered by :: R. Mashal Slavick LPN   Activities of  Daily Living     12/08/2023    2:02 PM  In your present state of health, do you have any difficulty performing the following activities:  Hearing? 0  Vision? 0  Difficulty concentrating or making decisions? 0  Walking or climbing stairs? 0  Dressing or bathing? 0  Doing errands, shopping? 0  Preparing Food and eating ? N  Using the Toilet? N  In the past six months, have you accidently leaked urine? Y  Do you have problems with loss of bowel control? Y  Managing your Medications? N  Managing your Finances? N  Housekeeping or managing your Housekeeping? N    Patient Care Team: Glendia Shad, MD as PCP - General (Internal Medicine) Darliss Rogue, MD as PCP - Cardiology (Cardiology) Jane Delmar Pike, NP as Nurse Practitioner (Gastroenterology) Henriette Clotilda CROME, MD as Consulting Physician (Nephrology) Gaston Glendia, MD as Consulting Physician (Urology) Toledo, Teodoro K, MD as Consulting Physician (Gastroenterology)  I have updated your Care Teams any recent Medical Services you may have received from other providers in the past year.     Assessment:   This is a routine wellness examination for Caitlin Lin.  Hearing/Vision screen Hearing Screening - Comments:: No issues Vision Screening - Comments:: glasses   Goals Addressed             This Visit's Progress    Patient Stated       Wants to try to walk some        Depression Screen     12/08/2023    2:11 PM 12/02/2022   11:11 AM 12/01/2022   11:27 AM 03/26/2022    4:21 PM 02/09/2022    1:06 PM 11/26/2021    9:53 AM 11/05/2021    9:31 AM  PHQ 2/9 Scores  PHQ - 2 Score 3 0 0 0 0 0 0  PHQ- 9 Score 5 4 4          Fall Risk     12/08/2023    2:05 PM 12/02/2022   11:11 AM 12/01/2022   11:23 AM 03/26/2022    4:21 PM 02/09/2022    1:06 PM  Fall Risk   Falls in the past year? 0 0 0 0 0  Number falls in past yr: 0 0 0 0 0  Injury with Fall? 0 0 0 0 0  Risk for fall due to : No Fall Risks No Fall Risks No Fall Risks No Fall Risks No Fall Risks  Follow up Falls evaluation completed;Falls prevention discussed Falls evaluation completed Falls prevention discussed;Falls evaluation completed Falls evaluation completed  Falls evaluation completed      Data saved with a previous flowsheet row definition    MEDICARE RISK AT HOME:  Medicare Risk at Home Any stairs in or around the home?: No If so, are there any without handrails?: No Home free of loose throw rugs in walkways, pet beds, electrical cords, etc?: Yes Adequate lighting in your home to reduce risk of falls?: Yes Life alert?: No Use of a cane, walker or w/c?: No Grab bars in the bathroom?: Yes Shower chair or bench in shower?: No Elevated toilet seat or a handicapped toilet?: Yes  TIMED UP AND GO:  Was the test performed?  No  Cognitive Function: 6CIT completed    09/24/2014    9:38 AM 09/24/2014    9:36 AM  MMSE - Mini Mental State Exam  Orientation to time 5  5   Orientation to Place 5  Registration 3  3   Attention/ Calculation 5    Recall 3  3   Language- name 2 objects 2    Language- repeat 1 1  Language- follow 3 step command 3    Language- read & follow direction 1  1   Write a sentence 1    Copy design 1  1   Total score 30       Data saved with a previous flowsheet row definition        12/08/2023    2:17 PM 12/01/2022   11:33 AM 11/26/2021    9:56 AM 11/25/2020    8:24 AM 11/23/2019    8:41 AM  6CIT Screen  What Year? 0 points 0 points 0 points 0 points 0 points  What month? 0 points 0 points 0 points 0 points 0 points  What time? 0 points 0 points 0 points 0 points 0 points  Count back from 20 0 points 0 points  0 points 0 points   Months in reverse 0 points 0 points 0 points 0 points   Repeat phrase 0 points 0 points 0 points 0 points   Total Score 0 points 0 points 0 points 0 points     Immunizations Immunization History  Administered Date(s) Administered   Fluad Quad(high Dose 65+) 12/22/2019, 01/03/2021   Hepatitis A 07/14/2017, 08/17/2017   Hepatitis B 07/14/2017, 08/17/2017   INFLUENZA, HIGH DOSE SEASONAL PF 12/21/2016, 11/17/2017, 12/26/2018, 12/31/2021, 12/24/2022   Influenza Split 12/15/2010, 03/07/2012   Influenza,inj,Quad PF,6+ Mos 12/05/2013   Influenza-Unspecified 12/25/2014, 01/03/2016, 12/28/2016   PFIZER(Purple Top)SARS-COV-2 Vaccination 05/29/2019, 06/19/2019   Pneumococcal Conjugate-13 03/12/2014   Pneumococcal Polysaccharide-23 12/09/2005, 03/03/2013   Tdap 03/07/2012   Zoster, Live 12/09/2009    Screening Tests Health Maintenance  Topic Date Due   COVID-19 Vaccine (3 - 2025-26 season) 11/15/2023   Medicare Annual Wellness (AWV)  12/01/2023   Zoster Vaccines- Shingrix (1 of 2) 01/15/2024 (Originally 04/21/1991)   DTaP/Tdap/Td (2 - Td or Tdap) 03/03/2024 (Originally 03/07/2022)   Influenza Vaccine  06/13/2024 (Originally 10/15/2023)   Mammogram  11/10/2024   Pneumococcal Vaccine: 50+ Years  Completed   DEXA SCAN  Completed   HPV VACCINES  Aged Out   Meningococcal B Vaccine  Aged Out   Hepatitis B Vaccines 19-59 Average Risk  Discontinued   Hepatitis C Screening  Discontinued    Health Maintenance Items Addressed: Discussed the need to update flu, tetanus and shingles vaccines  Patient declines covid vaccine. Patient wants to discuss Dexa with PCP at next office visit.   Additional Screening:  Vision Screening: Recommended annual ophthalmology exams for early detection of glaucoma and other disorders of the eye. Is the patient up to date with their annual eye exam?  Yes  Who is the provider or what is the name of the office in which the patient attends annual eye  exams?  [facility]  Dental Screening: Recommended annual dental exams for proper oral hygiene  Community Resource Referral / Chronic Care Management: CRR required this visit?  No   CCM required this visit?  No   Plan:    I have personally reviewed and noted the following in the patient's chart:   Medical and social history Use of alcohol, tobacco or illicit drugs  Current medications and supplements including opioid prescriptions. Patient is not currently taking opioid prescriptions. Functional ability and status Nutritional status Physical activity Advanced directives List of other physicians Hospitalizations, surgeries, and ER visits in previous  12 months Vitals Screenings to include cognitive, depression, and falls Referrals and appointments  In addition, I have reviewed and discussed with patient certain preventive protocols, quality metrics, and best practice recommendations. A written personalized care plan for preventive services as well as general preventive health recommendations were provided to patient.   Angeline Fredericks, LPN   [id number]   After Visit Summary: (Declined) Due to this being a telephonic visit, with patients personalized plan was offered to patient but patient Declined AVS at this time   Notes: Nothing significant to report at this time.

## 2023-12-08 NOTE — Progress Notes (Signed)
 Called patient and left a voicemail

## 2023-12-15 ENCOUNTER — Other Ambulatory Visit: Payer: Self-pay | Admitting: Internal Medicine

## 2023-12-21 DIAGNOSIS — N184 Chronic kidney disease, stage 4 (severe): Secondary | ICD-10-CM | POA: Diagnosis not present

## 2024-01-20 DIAGNOSIS — K219 Gastro-esophageal reflux disease without esophagitis: Secondary | ICD-10-CM | POA: Diagnosis not present

## 2024-01-20 DIAGNOSIS — K5900 Constipation, unspecified: Secondary | ICD-10-CM | POA: Diagnosis not present

## 2024-01-20 DIAGNOSIS — R1013 Epigastric pain: Secondary | ICD-10-CM | POA: Diagnosis not present

## 2024-02-06 DIAGNOSIS — R399 Unspecified symptoms and signs involving the genitourinary system: Secondary | ICD-10-CM | POA: Diagnosis not present

## 2024-02-09 ENCOUNTER — Other Ambulatory Visit: Payer: Self-pay | Admitting: *Deleted

## 2024-02-09 DIAGNOSIS — E7800 Pure hypercholesterolemia, unspecified: Secondary | ICD-10-CM

## 2024-02-09 DIAGNOSIS — I10 Essential (primary) hypertension: Secondary | ICD-10-CM

## 2024-02-09 DIAGNOSIS — R739 Hyperglycemia, unspecified: Secondary | ICD-10-CM

## 2024-02-16 ENCOUNTER — Other Ambulatory Visit

## 2024-02-16 ENCOUNTER — Ambulatory Visit: Payer: Self-pay | Admitting: Internal Medicine

## 2024-02-16 DIAGNOSIS — I10 Essential (primary) hypertension: Secondary | ICD-10-CM | POA: Diagnosis not present

## 2024-02-16 DIAGNOSIS — R739 Hyperglycemia, unspecified: Secondary | ICD-10-CM | POA: Diagnosis not present

## 2024-02-16 DIAGNOSIS — E7800 Pure hypercholesterolemia, unspecified: Secondary | ICD-10-CM | POA: Diagnosis not present

## 2024-02-16 LAB — BASIC METABOLIC PANEL WITH GFR
BUN: 27 mg/dL — ABNORMAL HIGH (ref 6–23)
CO2: 31 meq/L (ref 19–32)
Calcium: 9.6 mg/dL (ref 8.4–10.5)
Chloride: 100 meq/L (ref 96–112)
Creatinine, Ser: 1.68 mg/dL — ABNORMAL HIGH (ref 0.40–1.20)
GFR: 28.09 mL/min — ABNORMAL LOW (ref >60.00)
Glucose, Bld: 92 mg/dL (ref 70–99)
Potassium: 4.8 meq/L (ref 3.5–5.1)
Sodium: 138 meq/L (ref 135–145)

## 2024-02-16 LAB — LIPID PANEL
Cholesterol: 135 mg/dL (ref 0–200)
HDL: 35.3 mg/dL — ABNORMAL LOW (ref >39.00)
LDL Cholesterol: 59 mg/dL (ref 0–99)
NonHDL: 99.95
Total CHOL/HDL Ratio: 4
Triglycerides: 207 mg/dL — ABNORMAL HIGH (ref 0.0–149.0)
VLDL: 41.4 mg/dL — ABNORMAL HIGH (ref 0.0–40.0)

## 2024-02-16 LAB — HEPATIC FUNCTION PANEL
ALT: 20 U/L (ref 0–35)
AST: 27 U/L (ref 0–37)
Albumin: 3.7 g/dL (ref 3.5–5.2)
Alkaline Phosphatase: 125 U/L — ABNORMAL HIGH (ref 39–117)
Bilirubin, Direct: 0.1 mg/dL (ref 0.0–0.3)
Total Bilirubin: 0.5 mg/dL (ref 0.2–1.2)
Total Protein: 6.5 g/dL (ref 6.0–8.3)

## 2024-02-16 LAB — HEMOGLOBIN A1C: Hgb A1c MFr Bld: 5.7 % (ref 4.6–6.5)

## 2024-02-18 ENCOUNTER — Ambulatory Visit: Payer: Self-pay | Admitting: Internal Medicine

## 2024-02-18 ENCOUNTER — Ambulatory Visit

## 2024-02-18 ENCOUNTER — Ambulatory Visit: Admitting: Internal Medicine

## 2024-02-18 ENCOUNTER — Encounter: Payer: Self-pay | Admitting: Internal Medicine

## 2024-02-18 VITALS — BP 126/70 | HR 72 | Temp 98.4°F | Ht 64.0 in | Wt 146.2 lb

## 2024-02-18 DIAGNOSIS — D509 Iron deficiency anemia, unspecified: Secondary | ICD-10-CM | POA: Diagnosis not present

## 2024-02-18 DIAGNOSIS — K219 Gastro-esophageal reflux disease without esophagitis: Secondary | ICD-10-CM

## 2024-02-18 DIAGNOSIS — N184 Chronic kidney disease, stage 4 (severe): Secondary | ICD-10-CM

## 2024-02-18 DIAGNOSIS — I10 Essential (primary) hypertension: Secondary | ICD-10-CM

## 2024-02-18 DIAGNOSIS — F439 Reaction to severe stress, unspecified: Secondary | ICD-10-CM | POA: Diagnosis not present

## 2024-02-18 DIAGNOSIS — R739 Hyperglycemia, unspecified: Secondary | ICD-10-CM | POA: Diagnosis not present

## 2024-02-18 DIAGNOSIS — E039 Hypothyroidism, unspecified: Secondary | ICD-10-CM

## 2024-02-18 DIAGNOSIS — M7989 Other specified soft tissue disorders: Secondary | ICD-10-CM | POA: Diagnosis not present

## 2024-02-18 DIAGNOSIS — I4891 Unspecified atrial fibrillation: Secondary | ICD-10-CM

## 2024-02-18 DIAGNOSIS — R350 Frequency of micturition: Secondary | ICD-10-CM

## 2024-02-18 DIAGNOSIS — M25572 Pain in left ankle and joints of left foot: Secondary | ICD-10-CM | POA: Insufficient documentation

## 2024-02-18 DIAGNOSIS — E7800 Pure hypercholesterolemia, unspecified: Secondary | ICD-10-CM | POA: Diagnosis not present

## 2024-02-18 DIAGNOSIS — M85872 Other specified disorders of bone density and structure, left ankle and foot: Secondary | ICD-10-CM | POA: Diagnosis not present

## 2024-02-18 DIAGNOSIS — R748 Abnormal levels of other serum enzymes: Secondary | ICD-10-CM

## 2024-02-18 MED ORDER — AMLODIPINE BESYLATE 5 MG PO TABS: 5.0000 mg | ORAL_TABLET | Freq: Every day | ORAL | 3 refills | Status: AC

## 2024-02-18 MED ORDER — METHYLPREDNISOLONE 4 MG PO TBPK: ORAL_TABLET | ORAL | 0 refills | Status: AC

## 2024-02-18 NOTE — Progress Notes (Signed)
 Subjective:    Patient ID: Caitlin Lin, female    DOB: 12/21/1940, 83 y.o.   MRN: [id number]  Patient here for  Chief Complaint  Patient presents with   Medical Management of Chronic Issues    HPI Here for a scheduled follow up - follow up regarding hypercholesterolemia, hypertension, GERD and sleep issues. Saw urology 05/24/23. Trial of myrbetriq . This caused stomach upset.  Reevaluated 09/28/23. Follow up - UTI. Has had issues with recurring infections. Was taking trimethoprim  for prophylaxis. During her 09/28/23 visit, she was instructed to stop trimethoprim . Start uquara. Also, urine checked to confirm no infection. Urine revealed infection. Treated with macrobid . Had f/u with nephrology 06/14/23. No changes made. Had f/u with cardiology 07/14/13 - plan f/u echo. Stable. Saw ortho 08/03/23 - s/p intra-articular knee joint injection. Follow up with GI 09/09/23 - recommended EGD. Evaluated for diarrhea, dyspepsia and constipation. EGD 10/13/23 - gastritis, multiple gastric polyps, 2cm hiatal hernia. Noticed starting last night, increased urinary frequency and discomfort. Started taking macrobid . Breathing stable. No vomiting. Saw Romero Antigua (GI) - started on miralax. Plans to take 1/2 capful every other day. Also reports left ankle swelling - increased pain, swelling and redness. A lot of walking recently - noticed pain after walking. No injury or trauma.    Past Medical History:  Diagnosis Date   Cataracts, bilateral    Chronic kidney disease    Followed by Dr. Marcelino   Chronic kidney disease    Colon polyp    Constipation due to slow transit    Diarrhea    Diarrhea    GERD (gastroesophageal reflux disease)    Heart murmur    History of cyst of breast    History of hiatal hernia    Hyperlipidemia    Hypertension    Hypothyroidism    Melanoma of lower leg (HCC) 2013   Shingles 2013   Shingles    Spinal stenosis in cervical region    Dr. Avanell   Subdural hematoma [facility])     Thyroid  disease    Past Surgical History:  Procedure Laterality Date   ABDOMINAL HYSTERECTOMY  1974   ANTERIOR AND POSTERIOR VAGINAL REPAIR     ANTERIOR AND POSTERIOR VAGINAL REPAIR W/ SACROSPINOUS LIGAMENT SUSPENSION Right    BREAST BIOPSY  1963   Benign per pt's report   BREAST EXCISIONAL BIOPSY Right 1963   neg   COLONOSCOPY     COLONOSCOPY N/A 10/29/2021   Procedure: COLONOSCOPY;  Surgeon: Toledo, Ladell POUR, MD;  Location: [facility] ENDOSCOPY;  Service: Gastroenterology;  Laterality: N/A;   ELECTROMAGNETIC NAVIGATION BROCHOSCOPY Right 01/09/2019   Procedure: ELECTROMAGNETIC NAVIGATION BRONCHOSCOPY;  Surgeon: Tamea Dedra CROME, MD;  Location: [facility] ORS;  Service: Cardiopulmonary;  Laterality: Right;   ESOPHAGOGASTRODUODENOSCOPY     ESOPHAGOGASTRODUODENOSCOPY N/A 10/13/2023   Procedure: EGD (ESOPHAGOGASTRODUODENOSCOPY);  Surgeon: Toledo, Ladell POUR, MD;  Location: [facility] ENDOSCOPY;  Service: Gastroenterology;  Laterality: N/A;   ESOPHAGOGASTRODUODENOSCOPY (EGD) WITH PROPOFOL  N/A 11/10/2016   Procedure: ESOPHAGOGASTRODUODENOSCOPY (EGD) WITH PROPOFOL ;  Surgeon: Gaylyn Gladis PENNER, MD;  Location: [facility] ENDOSCOPY;  Service: Endoscopy;  Laterality: N/A;   ESOPHAGOGASTRODUODENOSCOPY (EGD) WITH PROPOFOL  N/A 09/09/2018   Procedure: ESOPHAGOGASTRODUODENOSCOPY (EGD) WITH PROPOFOL ;  Surgeon: Gaylyn Gladis PENNER, MD;  Location: [facility] ENDOSCOPY;  Service: Endoscopy;  Laterality: N/A;   ESOPHAGOGASTRODUODENOSCOPY (EGD) WITH PROPOFOL  N/A 10/27/2019   Procedure: ESOPHAGOGASTRODUODENOSCOPY (EGD) WITH PROPOFOL ;  Surgeon: Dessa Reyes ORN, MD;  Location: [facility] ENDOSCOPY;  Service: Endoscopy;  Laterality: N/A;   ESOPHAGOGASTRODUODENOSCOPY (EGD) WITH  PROPOFOL  N/A 08/09/2020   Procedure: ESOPHAGOGASTRODUODENOSCOPY (EGD) WITH PROPOFOL ;  Surgeon: Maryruth Ole DASEN, MD;  Location: [facility] ENDOSCOPY;  Service: Endoscopy;  Laterality: N/A;   LUNG BIOPSY  2007   Benign per pt's report   LUNG BIOPSY Right    OOPHORECTOMY   1970   PERINEOPLASTY  12/17/2013   sling for stress incontinence  12/18/2011   Family History  Problem Relation Age of Onset   Arthritis Mother    Liver disease Mother    Cirrhosis Mother    Liver cancer Mother    Heart disease Father    Heart attack Father    Coronary artery disease Father    Depression Brother    Prostate cancer Brother    Diabetes Maternal Aunt    Breast cancer Cousin        maternal cousins   Liver cancer Cousin    Breast cancer Other    Social History   Socioeconomic History   Marital status: Married    Spouse name: Not on file   Number of children: 2   Years of education: Not on file   Highest education level: Not on file  Occupational History   Occupation: Retired  Tobacco Use   Smoking status: Never    Passive exposure: Never   Smokeless tobacco: Never  Vaping Use   Vaping status: Never Used  Substance and Sexual Activity   Alcohol use: No   Drug use: No   Sexual activity: Yes  Other Topics Concern   Not on file  Social History Narrative   Lives in [place] with husband. Has 2 children boy and girl, 4 grandchildren. Retired insurance underwriter.       Daily Caffeine Use:  NO   Regular Exercise -  Not at this time due to back pain, would like to resume soon   Social Drivers of Corporate Investment Banker Strain: Low Risk  (12/08/2023)   Overall Financial Resource Strain (CARDIA)    Difficulty of Paying Living Expenses: Not hard at all  Food Insecurity: No Food Insecurity (12/08/2023)   Hunger Vital Sign    Worried About Running Out of Food in the Last Year: Never true    Ran Out of Food in the Last Year: Never true  Transportation Needs: No Transportation Needs (12/08/2023)   PRAPARE - Administrator, Civil Service (Medical): No    Lack of Transportation (Non-Medical): No  Physical Activity: Inactive (12/08/2023)   Exercise Vital Sign    Days of Exercise per Week: 0 days    Minutes of Exercise per Session: 0 min  Stress: No  Stress Concern Present (12/08/2023)   [employer] of Occupational Health - Occupational Stress Questionnaire    Feeling of Stress: Not at all  Social Connections: Socially Integrated (12/08/2023)   Social Connection and Isolation Panel    Frequency of Communication with Friends and Family: More than three times a week    Frequency of Social Gatherings with Friends and Family: Three times a week    Attends Religious Services: More than 4 times per year    Active Member of Clubs or Organizations: Yes    Attends Banker Meetings: More than 4 times per year    Marital Status: Married     Review of Systems  Constitutional:  Negative for appetite change and unexpected weight change.  HENT:  Negative for congestion and sinus pressure.   Respiratory:  Negative for cough, chest tightness and shortness  of breath.   Cardiovascular:  Negative for chest pain and palpitations.  Gastrointestinal:  Negative for abdominal pain, nausea and vomiting.  Genitourinary:  Positive for dysuria and frequency.  Musculoskeletal:        Increased left ankle swelling and pain as outlined.   Skin:  Negative for wound.  Neurological:  Negative for dizziness and headaches.  Psychiatric/Behavioral:  Negative for agitation and dysphoric mood.        Objective:     BP 126/70   Pulse 72   Temp 98.4 F (36.9 C) (Oral)   Ht 5' 4 (1.626 m)   Wt 146 lb 3.2 oz (66.3 kg)   SpO2 98%   BMI 25.10 kg/m  Wt Readings from Last 3 Encounters:  02/18/24 146 lb 3.2 oz (66.3 kg)  12/08/23 146 lb (66.2 kg)  12/02/23 149 lb 9.6 oz (67.9 kg)    Physical Exam Vitals reviewed.  Constitutional:      General: She is not in acute distress.    Appearance: Normal appearance.  HENT:     Head: Normocephalic and atraumatic.     Right Ear: External ear normal.     Left Ear: External ear normal.  Eyes:     General: No scleral icterus.       Right eye: No discharge.        Left eye: No discharge.      Conjunctiva/sclera: Conjunctivae normal.  Neck:     Thyroid : No thyromegaly.  Cardiovascular:     Rate and Rhythm: Normal rate and regular rhythm.  Pulmonary:     Effort: No respiratory distress.     Breath sounds: Normal breath sounds. No wheezing.  Abdominal:     General: Bowel sounds are normal.     Palpations: Abdomen is soft.     Tenderness: There is no abdominal tenderness.  Musculoskeletal:     Cervical back: Neck supple. No tenderness.     Comments: Increased soft tissue swelling and pain - left ankle. Minimal erythema. Pain with palpation - localized to left lateral ankle. No significant pain with increased flexion and extension of foot.   Lymphadenopathy:     Cervical: No cervical adenopathy.  Skin:    Findings: No erythema or rash.  Neurological:     Mental Status: She is alert.  Psychiatric:        Mood and Affect: Mood normal.        Behavior: Behavior normal.         Outpatient Encounter Medications as of 02/18/2024  Medication Sig   Alpha-Lipoic Acid 200 MG TABS Take 200 mg by mouth 3 (three) times daily.   aspirin  81 MG tablet Take 1 tablet (81 mg total) by mouth daily.   atorvastatin  (LIPITOR) 20 MG tablet TAKE 1 TABLET BY MOUTH DAILY.   chlorthalidone (HYGROTON) 25 MG tablet Take 12.5 mg by mouth every morning.   Cholecalciferol  25 MCG (1000 UT) tablet Take 1,000 Units by mouth daily.   Docusate Calcium  (STOOL SOFTENER PO) Take by mouth.   estradiol  (ESTRACE  VAGINAL) 0.1 MG/GM vaginal cream Apply 0.5mg  (pea-sized amount)  just inside the vaginal introitus with a finger-tip every day for 30 days and then on Monday, Wednesday and Friday nights.   gabapentin  (NEURONTIN ) 100 MG capsule TAKE 2 CAPSULES AT BEDTIME   levothyroxine  (SYNTHROID ) 75 MCG tablet TAKE 1 TABLET EVERY DAY ON EMPTY STOMACHWITH A GLASS OF WATER AT LEAST 30-60 MINBEFORE BREAKFAST   methylPREDNISolone  (MEDROL  DOSEPAK) 4 MG TBPK tablet  Medrol  dose pak - 6 day taper - take as directed.   Multiple  Vitamin (MULTIVITAMIN) tablet Take 1 tablet by mouth daily.   nitrofurantoin , macrocrystal-monohydrate, (MACROBID ) 100 MG capsule Take 1 capsule (100 mg total) by mouth every 12 (twelve) hours. (Patient taking differently: Take 100 mg by mouth every 12 (twelve) hours. As needed)   Omega-3 Fatty Acids (FISH OIL ) 1000 MG CAPS Take 1,000 mg by mouth daily.   pantoprazole  (PROTONIX ) 40 MG tablet TAKE 1 TABLET BY MOUTH DAILY. TAKE 30 MINUTES BEFORE BREAKFAST.   Polyethylene Glycol 3350 (MIRALAX PO) Take by mouth as needed.   Wheat Dextrin (BENEFIBER PO) Take by mouth. (Patient taking differently: Take by mouth. As needed)   amLODipine  (NORVASC ) 5 MG tablet Take 1 tablet (5 mg total) by mouth daily.   [DISCONTINUED] amLODipine  (NORVASC ) 5 MG tablet Take 1 tablet (5 mg total) by mouth daily.   [DISCONTINUED] cephALEXin  (KEFLEX ) 500 MG capsule Take 500 mg by mouth 3 (three) times daily. (Patient not taking: Reported on 02/18/2024)   [DISCONTINUED] Vibegron  (GEMTESA ) 75 MG TABS Take 1 tablet (75 mg total) by mouth daily. (Patient not taking: Reported on 02/18/2024)   No facility-administered encounter medications on file as of 02/18/2024.     Lab Results  Component Value Date   WBC 10.1 03/04/2023   HGB 12.7 03/04/2023   HCT 38.3 03/04/2023   PLT 182.0 03/04/2023   GLUCOSE 92 02/16/2024   CHOL 135 02/16/2024   TRIG 207.0 (H) 02/16/2024   HDL 35.30 (L) 02/16/2024   LDLDIRECT 80.0 01/05/2022   LDLCALC 59 02/16/2024   ALT 20 02/16/2024   AST 27 02/16/2024   NA 138 02/16/2024   K 4.8 02/16/2024   CL 100 02/16/2024   CREATININE 1.68 (H) 02/16/2024   BUN 27 (H) 02/16/2024   CO2 31 02/16/2024   TSH 5.07 10/11/2023   INR 1.1 06/18/2022   HGBA1C 5.7 02/16/2024    MM 3D SCREENING MAMMOGRAM BILATERAL BREAST Result Date: 11/16/2023 CLINICAL DATA:  Screening. EXAM: DIGITAL SCREENING BILATERAL MAMMOGRAM WITH TOMOSYNTHESIS AND CAD TECHNIQUE: Bilateral screening digital craniocaudal and mediolateral  oblique mammograms were obtained. Bilateral screening digital breast tomosynthesis was performed. The images were evaluated with computer-aided detection. COMPARISON:  Previous exam(s). ACR Breast Density Category b: There are scattered areas of fibroglandular density. FINDINGS: There are no findings suspicious for malignancy. IMPRESSION: No mammographic evidence of malignancy. A result letter of this screening mammogram will be mailed directly to the patient. RECOMMENDATION: Screening mammogram in one year. (Code:SM-B-01Y) BI-RADS CATEGORY  1: Negative. Electronically Signed   By: Reyes Phi M.D.   On: 11/16/2023 19:00       Assessment & Plan:  Acute left ankle pain Assessment & Plan: Left ankle pain and swelling. Discussed possible etiologies - no known injury or trauma. Will check xray to confirm no acute bony abnormality. Discussed possible inflammatory arthritis. Has a history of gout. Treat with medrol  dose pak as directed. Follow closely. Call with update.   Orders: -     DG Ankle 2 Views Left; Future  Primary hypertension Assessment & Plan: Continue amlodipine . Blood pressure as outlined. No changes in medication today. Follow pressures. Follow metabolic panel.   Orders: -     Basic metabolic panel with GFR; Future  Hyperglycemia Assessment & Plan: Low carb diet and exercise. Follow metabolic panel and A1c.   Orders: -     Hemoglobin A1c; Future  Hypercholesterolemia Assessment & Plan: Continue lipitor. Low cholesterol diet and exercise.  Follow lipid panel.   Orders: -     Hepatic function panel; Future -     Lipid panel; Future  Urinary frequency Assessment & Plan: Urinary frequency and dysuria as outlined. Check urine. On macrobid  already - started prior to her visit. Stay hydrated.   Orders: -     Urinalysis, Routine w reflex microscopic -     Urine Culture  Stress Assessment & Plan: Continue zoloft . Stable.    Hypothyroidism, unspecified type Assessment &  Plan: On thyroid  replacement. Follow tsh.    Gastroesophageal reflux disease without esophagitis Assessment & Plan: EGD - 08/09/20 - mild reactive gastropathy, fundic gland polyps, mucosa with edema and congestion. Persistent abdominal discomfort as outlined previous note.  Saw GI 12/2022. Recommended protonix  bid.  Follow up with GI 09/09/23 - recommended EGD. Evaluated for diarrhea, dyspepsia and constipation. EGD 10/13/23 - gastritis, multiple gastric polyps, 2cm hiatal hernia. No increased upper symptoms reported today. Follow.    Elevated alkaline phosphatase level Assessment & Plan: Previous GGT wnl.  Intact PTH and vitamin D  wnl.  Follow liver panel.    CKD (chronic kidney disease) stage 4, GFR 15-29 ml/min (HCC) Assessment & Plan: On amlodipine . Followed by nephrology. Avoid antiinflammatory medications. Recent GFR 28.    Atrial fibrillation, unspecified type [facility]) Assessment & Plan: Was found to have new onset afib with RVR (up to 186) - during recent hospitalization. Was given IV cardizem  - followed by oral cardizem .  CXR negative.  Dr Darron consulted. Found to have UTI - E.coli pan sensitive.  IV abx and discharged on cipro  for 10 days total abx.  Not started on anticoagulants.  Was felt afib - related to acute illness.  Zio monitor placed.  Discharged on metoprolol . Had f/u with cardiology 08/21/22. No further arrhythmias noted. Previous myoview  - no ischemia.  Echo with normal EF.  Recommended continuing aspirin  and statin. Hold anticoagulation - lone afib in the setting of UTI sepsis.  Overall doing well. Appears to be in SR today. Stable. Follow.    Iron deficiency anemia, unspecified iron deficiency anemia type Assessment & Plan: Follow cbc and iron studies.    Other orders -     amLODIPine  Besylate; Take 1 tablet (5 mg total) by mouth daily.  Dispense: 90 tablet; Refill: 3 -     methylPREDNISolone ; Medrol  dose pak - 6 day taper - take as directed.  Dispense: 21 tablet;  Refill: 0 -     Microscopic Examination     Allena Hamilton, MD

## 2024-02-19 ENCOUNTER — Encounter: Payer: Self-pay | Admitting: Internal Medicine

## 2024-02-19 LAB — URINALYSIS, ROUTINE W REFLEX MICROSCOPIC
Bilirubin, UA: NEGATIVE
Glucose, UA: NEGATIVE
Ketones, UA: NEGATIVE
Nitrite, UA: NEGATIVE
Protein,UA: NEGATIVE
Specific Gravity, UA: 1.008 (ref 1.005–1.030)
Urobilinogen, Ur: 0.2 mg/dL (ref 0.2–1.0)
pH, UA: 6 (ref 5.0–7.5)

## 2024-02-19 LAB — MICROSCOPIC EXAMINATION
Bacteria, UA: NONE SEEN
Casts: NONE SEEN /LPF
RBC, Urine: NONE SEEN /HPF (ref 0–2)

## 2024-02-19 NOTE — Assessment & Plan Note (Signed)
 Continue amlodipine . Blood pressure as outlined. No changes in medication today. Follow pressures. Follow metabolic panel.

## 2024-02-19 NOTE — Assessment & Plan Note (Signed)
Continue lipitor.  Low cholesterol diet and exercise.  Follow lipid panel.

## 2024-02-19 NOTE — Assessment & Plan Note (Signed)
Low-carb diet and exercise.  Follow metabolic panel and A1c. 

## 2024-02-19 NOTE — Assessment & Plan Note (Signed)
 Follow cbc and iron studies.

## 2024-02-19 NOTE — Assessment & Plan Note (Signed)
 Was found to have new onset afib with RVR (up to 186) - during recent hospitalization. Was given IV cardizem  - followed by oral cardizem .  CXR negative.  Dr Darron consulted. Found to have UTI - E.coli pan sensitive.  IV abx and discharged on cipro  for 10 days total abx.  Not started on anticoagulants.  Was felt afib - related to acute illness.  Zio monitor placed.  Discharged on metoprolol . Had f/u with cardiology 08/21/22. No further arrhythmias noted. Previous myoview  - no ischemia.  Echo with normal EF.  Recommended continuing aspirin  and statin. Hold anticoagulation - lone afib in the setting of UTI sepsis.  Overall doing well. Appears to be in SR today. Stable. Follow.

## 2024-02-19 NOTE — Assessment & Plan Note (Signed)
 Urinary frequency and dysuria as outlined. Check urine. On macrobid  already - started prior to her visit. Stay hydrated.

## 2024-02-19 NOTE — Assessment & Plan Note (Signed)
 Left ankle pain and swelling. Discussed possible etiologies - no known injury or trauma. Will check xray to confirm no acute bony abnormality. Discussed possible inflammatory arthritis. Has a history of gout. Treat with medrol  dose pak as directed. Follow closely. Call with update.

## 2024-02-19 NOTE — Assessment & Plan Note (Signed)
 Previous GGT wnl.  Intact PTH and vitamin D  wnl.  Follow liver panel.

## 2024-02-19 NOTE — Assessment & Plan Note (Signed)
 Continue zoloft.  Stable.

## 2024-02-19 NOTE — Assessment & Plan Note (Signed)
 EGD - 08/09/20 - mild reactive gastropathy, fundic gland polyps, mucosa with edema and congestion. Persistent abdominal discomfort as outlined previous note.  Saw GI 12/2022. Recommended protonix  bid.  Follow up with GI 09/09/23 - recommended EGD. Evaluated for diarrhea, dyspepsia and constipation. EGD 10/13/23 - gastritis, multiple gastric polyps, 2cm hiatal hernia. No increased upper symptoms reported today. Follow.

## 2024-02-19 NOTE — Assessment & Plan Note (Signed)
 On thyroid replacement.  Follow tsh.

## 2024-02-19 NOTE — Assessment & Plan Note (Signed)
 On amlodipine . Followed by nephrology. Avoid antiinflammatory medications. Recent GFR 28.

## 2024-02-23 LAB — URINE CULTURE

## 2024-02-24 DIAGNOSIS — Z87440 Personal history of urinary (tract) infections: Secondary | ICD-10-CM | POA: Diagnosis not present

## 2024-02-24 DIAGNOSIS — N184 Chronic kidney disease, stage 4 (severe): Secondary | ICD-10-CM | POA: Diagnosis not present

## 2024-02-24 DIAGNOSIS — K5900 Constipation, unspecified: Secondary | ICD-10-CM | POA: Diagnosis not present

## 2024-02-24 DIAGNOSIS — R159 Full incontinence of feces: Secondary | ICD-10-CM | POA: Diagnosis not present

## 2024-02-24 DIAGNOSIS — I10 Essential (primary) hypertension: Secondary | ICD-10-CM | POA: Diagnosis not present

## 2024-03-01 ENCOUNTER — Ambulatory Visit: Payer: Self-pay

## 2024-03-01 NOTE — Telephone Encounter (Signed)
 FYI Only or Action Required?: Action required by provider: update on patient condition.  Patient was last seen in primary care on 02/18/2024 by Glendia Shad, MD.  Called Nurse Triage reporting Otalgia.  Symptoms began today.  Interventions attempted: Nothing.  Symptoms are: stable.  Triage Disposition: Home Care  Patient/caregiver understands and will follow disposition?: Yes   Copied from CRM #[id number]. Topic: Clinical - Medication Question >> Mar 01, 2024  9:21 AM Tonda B wrote: Reason for CRM: pt calling wanting to know if she can keep taking over the counter medication for her possible ear infection please call pt back  [phone] Reason for Disposition  Earache lasts < 60 minutes  Answer Assessment - Initial Assessment Questions Returned pt's call to discuss symptoms. Pt states that this morning, she had some puffiness on her R neck below her ear and she wasn't sure if it was a swollen gland. Pt denies pain inside the ear or any discharge, states that puffiness was present directly below ear. Pt denies fever or stiff neck. States symptoms have resolved at this time and she has no further concerns. Reassured her to keep clinic updated if she needs us .    1. LOCATION: Which ear is involved?     R ear/neck   2. ONSET: When did the ear pain start?      This morning; has since resolved   3. SEVERITY: How bad is the pain?  (Scale 1-10; mild, moderate or severe)     Denies pain; states that she felt puffiness/swelling of gland under her ear/on neck   4. URI SYMPTOMS: Do you have a runny nose or cough?     None   5. FEVER: Do you have a fever? If Yes, ask: What is your temperature, how was it measured, and when did it start?     No   6. CAUSE: Have you been swimming recently?, How often do you use Q-TIPS?, Have you had any recent air travel or scuba diving?     Unknown   7. OTHER SYMPTOMS: Do you have any other symptoms? (e.g., decreased hearing,  dizziness, headache, stiff neck, vomiting)     None  Protocols used: Earache-A-AH

## 2024-04-01 ENCOUNTER — Other Ambulatory Visit: Payer: Self-pay | Admitting: Internal Medicine

## 2024-04-11 ENCOUNTER — Other Ambulatory Visit: Payer: Self-pay | Admitting: Internal Medicine

## 2024-05-29 ENCOUNTER — Other Ambulatory Visit

## 2024-05-31 ENCOUNTER — Encounter: Admitting: Internal Medicine

## 2024-12-12 ENCOUNTER — Ambulatory Visit
